# Patient Record
Sex: Female | Born: 1935 | Race: White | Hispanic: No | Smoking: Never smoker
Health system: Southern US, Community
[De-identification: ages and names within clinical notes are randomized; demographics above are authoritative.]

## PROBLEM LIST (undated history)

## (undated) DIAGNOSIS — S32000A Wedge compression fracture of unspecified lumbar vertebra, initial encounter for closed fracture: Secondary | ICD-10-CM

## (undated) DIAGNOSIS — I10 Essential (primary) hypertension: Secondary | ICD-10-CM

## (undated) DIAGNOSIS — Z889 Allergy status to unspecified drugs, medicaments and biological substances status: Secondary | ICD-10-CM

## (undated) DIAGNOSIS — M8580 Other specified disorders of bone density and structure, unspecified site: Secondary | ICD-10-CM

## (undated) DIAGNOSIS — J449 Chronic obstructive pulmonary disease, unspecified: Secondary | ICD-10-CM

## (undated) DIAGNOSIS — N189 Chronic kidney disease, unspecified: Secondary | ICD-10-CM

## (undated) DIAGNOSIS — J310 Chronic rhinitis: Secondary | ICD-10-CM

## (undated) DIAGNOSIS — F0390 Unspecified dementia without behavioral disturbance: Secondary | ICD-10-CM

## (undated) DIAGNOSIS — M1990 Unspecified osteoarthritis, unspecified site: Secondary | ICD-10-CM

## (undated) DIAGNOSIS — J42 Unspecified chronic bronchitis: Secondary | ICD-10-CM

## (undated) DIAGNOSIS — J189 Pneumonia, unspecified organism: Secondary | ICD-10-CM

## (undated) DIAGNOSIS — E785 Hyperlipidemia, unspecified: Secondary | ICD-10-CM

## (undated) DIAGNOSIS — I509 Heart failure, unspecified: Secondary | ICD-10-CM

## (undated) HISTORY — DX: Other specified disorders of bone density and structure, unspecified site: M85.80

## (undated) HISTORY — DX: Chronic obstructive pulmonary disease, unspecified: J44.9

## (undated) HISTORY — PX: CHOLECYSTECTOMY: SHX55

## (undated) HISTORY — DX: Chronic kidney disease, unspecified: N18.9

## (undated) HISTORY — DX: Allergy status to unspecified drugs, medicaments and biological substances: Z88.9

## (undated) HISTORY — DX: Chronic rhinitis: J31.0

## (undated) HISTORY — DX: Hyperlipidemia, unspecified: E78.5

## (undated) HISTORY — PX: FRACTURE SURGERY: SHX138

## (undated) HISTORY — DX: Essential (primary) hypertension: I10

## (undated) HISTORY — PX: CATARACT EXTRACTION W/ INTRAOCULAR LENS  IMPLANT, BILATERAL: SHX1307

---

## 1997-10-16 ENCOUNTER — Other Ambulatory Visit: Admission: RE | Admit: 1997-10-16 | Discharge: 1997-10-16 | Payer: Self-pay | Admitting: Family Medicine

## 1999-12-31 ENCOUNTER — Encounter: Admission: RE | Admit: 1999-12-31 | Discharge: 1999-12-31 | Payer: Self-pay | Admitting: Family Medicine

## 1999-12-31 ENCOUNTER — Encounter: Payer: Self-pay | Admitting: Family Medicine

## 2000-02-04 ENCOUNTER — Ambulatory Visit (HOSPITAL_COMMUNITY): Admission: RE | Admit: 2000-02-04 | Discharge: 2000-02-04 | Payer: Self-pay | Admitting: Family Medicine

## 2000-02-04 ENCOUNTER — Encounter: Payer: Self-pay | Admitting: Family Medicine

## 2000-03-16 ENCOUNTER — Encounter: Admission: RE | Admit: 2000-03-16 | Discharge: 2000-03-25 | Payer: Self-pay | Admitting: Neurosurgery

## 2000-03-25 ENCOUNTER — Other Ambulatory Visit: Admission: RE | Admit: 2000-03-25 | Discharge: 2000-03-25 | Payer: Self-pay | Admitting: Family Medicine

## 2000-10-20 ENCOUNTER — Encounter: Payer: Self-pay | Admitting: Family Medicine

## 2000-10-20 ENCOUNTER — Ambulatory Visit (HOSPITAL_COMMUNITY): Admission: RE | Admit: 2000-10-20 | Discharge: 2000-10-20 | Payer: Self-pay | Admitting: Family Medicine

## 2000-10-22 ENCOUNTER — Ambulatory Visit (HOSPITAL_COMMUNITY): Admission: RE | Admit: 2000-10-22 | Discharge: 2000-10-22 | Payer: Self-pay | Admitting: Family Medicine

## 2000-10-22 ENCOUNTER — Encounter: Payer: Self-pay | Admitting: Family Medicine

## 2000-11-12 ENCOUNTER — Encounter (INDEPENDENT_AMBULATORY_CARE_PROVIDER_SITE_OTHER): Payer: Self-pay | Admitting: Specialist

## 2000-11-12 ENCOUNTER — Inpatient Hospital Stay (HOSPITAL_COMMUNITY): Admission: RE | Admit: 2000-11-12 | Discharge: 2000-11-13 | Payer: Self-pay | Admitting: General Surgery

## 2001-02-15 ENCOUNTER — Encounter: Payer: Self-pay | Admitting: Family Medicine

## 2001-02-15 ENCOUNTER — Encounter: Admission: RE | Admit: 2001-02-15 | Discharge: 2001-02-15 | Payer: Self-pay | Admitting: Family Medicine

## 2001-06-01 ENCOUNTER — Other Ambulatory Visit: Admission: RE | Admit: 2001-06-01 | Discharge: 2001-06-01 | Payer: Self-pay | Admitting: Family Medicine

## 2002-02-21 ENCOUNTER — Ambulatory Visit (HOSPITAL_COMMUNITY): Admission: RE | Admit: 2002-02-21 | Discharge: 2002-02-21 | Payer: Self-pay | Admitting: Otolaryngology

## 2002-02-21 ENCOUNTER — Encounter: Payer: Self-pay | Admitting: Otolaryngology

## 2002-03-09 ENCOUNTER — Ambulatory Visit (HOSPITAL_COMMUNITY): Admission: RE | Admit: 2002-03-09 | Discharge: 2002-03-09 | Payer: Self-pay | Admitting: Otolaryngology

## 2002-03-09 ENCOUNTER — Encounter: Payer: Self-pay | Admitting: Otolaryngology

## 2002-05-16 ENCOUNTER — Encounter: Admission: RE | Admit: 2002-05-16 | Discharge: 2002-05-16 | Payer: Self-pay | Admitting: Family Medicine

## 2002-05-16 ENCOUNTER — Encounter: Payer: Self-pay | Admitting: Family Medicine

## 2002-06-14 ENCOUNTER — Encounter: Admission: RE | Admit: 2002-06-14 | Discharge: 2002-07-04 | Payer: Self-pay | Admitting: Family Medicine

## 2002-07-13 HISTORY — PX: THYROIDECTOMY, PARTIAL: SHX18

## 2002-07-25 ENCOUNTER — Other Ambulatory Visit: Admission: RE | Admit: 2002-07-25 | Discharge: 2002-07-25 | Payer: Self-pay | Admitting: Family Medicine

## 2003-03-16 ENCOUNTER — Encounter: Payer: Self-pay | Admitting: Otolaryngology

## 2003-03-16 ENCOUNTER — Ambulatory Visit (HOSPITAL_COMMUNITY): Admission: RE | Admit: 2003-03-16 | Discharge: 2003-03-16 | Payer: Self-pay | Admitting: Otolaryngology

## 2004-02-27 ENCOUNTER — Encounter: Admission: RE | Admit: 2004-02-27 | Discharge: 2004-02-27 | Payer: Self-pay | Admitting: Family Medicine

## 2004-03-12 ENCOUNTER — Ambulatory Visit (HOSPITAL_COMMUNITY): Admission: RE | Admit: 2004-03-12 | Discharge: 2004-03-12 | Payer: Self-pay | Admitting: Otolaryngology

## 2005-03-26 ENCOUNTER — Ambulatory Visit (HOSPITAL_COMMUNITY): Admission: RE | Admit: 2005-03-26 | Discharge: 2005-03-26 | Payer: Self-pay | Admitting: Otolaryngology

## 2005-04-16 ENCOUNTER — Encounter: Admission: RE | Admit: 2005-04-16 | Discharge: 2005-04-16 | Payer: Self-pay | Admitting: Family Medicine

## 2005-06-16 ENCOUNTER — Other Ambulatory Visit: Admission: RE | Admit: 2005-06-16 | Discharge: 2005-06-16 | Payer: Self-pay | Admitting: Family Medicine

## 2005-09-14 ENCOUNTER — Ambulatory Visit (HOSPITAL_COMMUNITY): Admission: RE | Admit: 2005-09-14 | Discharge: 2005-09-14 | Payer: Self-pay | Admitting: Family Medicine

## 2006-02-03 ENCOUNTER — Ambulatory Visit (HOSPITAL_COMMUNITY): Admission: RE | Admit: 2006-02-03 | Discharge: 2006-02-03 | Payer: Self-pay | Admitting: Otolaryngology

## 2006-02-03 ENCOUNTER — Encounter (INDEPENDENT_AMBULATORY_CARE_PROVIDER_SITE_OTHER): Payer: Self-pay | Admitting: *Deleted

## 2006-02-10 ENCOUNTER — Ambulatory Visit (HOSPITAL_COMMUNITY): Admission: RE | Admit: 2006-02-10 | Discharge: 2006-02-10 | Payer: Self-pay | Admitting: Family Medicine

## 2006-04-21 ENCOUNTER — Encounter: Admission: RE | Admit: 2006-04-21 | Discharge: 2006-04-21 | Payer: Self-pay | Admitting: Family Medicine

## 2006-08-19 ENCOUNTER — Ambulatory Visit (HOSPITAL_COMMUNITY): Admission: RE | Admit: 2006-08-19 | Discharge: 2006-08-19 | Payer: Self-pay | Admitting: Family Medicine

## 2007-04-25 ENCOUNTER — Ambulatory Visit (HOSPITAL_COMMUNITY): Admission: RE | Admit: 2007-04-25 | Discharge: 2007-04-25 | Payer: Self-pay | Admitting: Otolaryngology

## 2007-04-26 ENCOUNTER — Encounter: Admission: RE | Admit: 2007-04-26 | Discharge: 2007-04-26 | Payer: Self-pay | Admitting: Family Medicine

## 2007-06-03 ENCOUNTER — Ambulatory Visit (HOSPITAL_COMMUNITY): Admission: RE | Admit: 2007-06-03 | Discharge: 2007-06-04 | Payer: Self-pay | Admitting: Otolaryngology

## 2007-06-03 ENCOUNTER — Encounter (INDEPENDENT_AMBULATORY_CARE_PROVIDER_SITE_OTHER): Payer: Self-pay | Admitting: Otolaryngology

## 2008-05-15 ENCOUNTER — Encounter: Admission: RE | Admit: 2008-05-15 | Discharge: 2008-05-15 | Payer: Self-pay | Admitting: Family Medicine

## 2009-06-11 ENCOUNTER — Encounter: Admission: RE | Admit: 2009-06-11 | Discharge: 2009-06-11 | Payer: Self-pay | Admitting: Family Medicine

## 2010-08-03 ENCOUNTER — Encounter: Payer: Self-pay | Admitting: Family Medicine

## 2010-11-25 NOTE — Op Note (Signed)
[name], [name] NO.:  [id number]   MEDICAL RECORD NO.:  [id number]          PATIENT TYPE:  OIB   LOCATION:  5729                         FACILITY:  [facility]   PHYSICIAN:  Suzanna Obey, M.D.       DATE OF BIRTH:  12/30/1925   DATE OF PROCEDURE:  06/03/2007  DATE OF DISCHARGE:                               OPERATIVE REPORT   PREOPERATIVE DIAGNOSIS:  Left thyroid mass.   POSTOPERATIVE DIAGNOSIS:  Left thyroid mass.   SURGICAL PROCEDURE:  Left thyroid lobectomy.   ANESTHESIA:  General.   ESTIMATED BLOOD LOSS:  Approximately 20 mL.   INDICATIONS:  This is a 77 year old who had an enlarging mass on the  left side and actually has 2 masses in the left thyroid lobe.  She was  informed of the options of this situation.  She wants to proceed with  removal, especially since it is enlarging.  Risks and benefits were  discussed.  All of her questions were answered and consent was obtained.   OPERATION:  The patient was taken to the operating room and placed  supine position.  After adequate general endotracheal tube anesthesia,  was prepped and draped in the usual sterile manner.   An incision was made at the base of the neck and in a skin crease and  dissected down to the platysma.  Subplatysmal flap was elevated  superiorly and inferior and retractor was positioned.  The midline of  the strap muscle was divided and the left side was dissected,  identifying the thyroid gland.  Careful dissection along the capsule was  performed, removing the thyroid lobe and the recurrent nerve was  identified, which was fairly well exposed even prior to the dissection.  A dissection was performed with a hemostat into the entrance into the  larynx.  The nerve was preserved.  The thyroid lobe was removed, along  its capsule as described.  The isthmus was divided and the lobe was sent  for frozen section.  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  The  wound was irrigated and #10  drain was placed and closed with interrupted  4-0 chromic and a running 5-0 nylon.  The patient was awakened, brought  to recovery in stable condition.  Counts correct.           ______________________________  Suzanna Obey, M.D.     JB/MEDQ  D:  06/03/2007  T:  06/04/2007  Job:  [id number]

## 2010-11-28 NOTE — Op Note (Signed)
[facility]  Patient:    Carrie Crawford, Carrie Crawford                    MRN: [id number] Proc. Date: 11/12/00 Adm. Date:  [id number] Attending:  Henrene Dodge CC:         Vernon Prey, M.D., [place], [place].   Operative Report  PREOPERATIVE DIAGNOSIS:  Chronic cholecystitis.  POSTOPERATIVE DIAGNOSIS:  Chronic cholecystitis.  OPERATION PERFORMED:  Laparoscopic cholecystectomy.  ANESTHESIA:  General.  HISTORY OF PRESENT ILLNESS:  Carrie Crawford is a 77 year old Caucasian family who was referred to [place] by Dr. Charise Killian associate from [place]. She had presented with episodes of epigastric pain. She had been seen by Dr. Buel Ream associate and had an ultrasound that showed gallstones and was referred to [place] approximately 10 days ago. At that time, she was not acutely ill. Her pain was consistent with biliary colic and I recommended we proceed with a laparoscopic cholecystectomy and cholangiogram. She had had shingles in the right upper quadrant of her abdomen approximately five years ago and has had episodes of pain that has been kind of associated with the shingles and whether this is related to shingles or she has been having problems with the gallbladder I am not sure. Preoperatively her liver function studies were normal as was her white blood count.  DESCRIPTION OF PROCEDURE:  An IV started, given 3 g of Unasyn. She has PAS stockings and was taken to the operative suite. The abdomen was prepped with Betadine surgical solution and draped in a sterile manner. A small incision was made below the umbilicus with sharp dissection identifying the fascia. This was picked up between hemostats and a small opening made. She has a thin abdominal wall. A traction suture was placed and then the Hasson cannula introduced as well as carbon dioxide. The camera was inserted and she has a kind of a chronically thickened gallbladder with a lot of adhesions around it. The upper  10 mm trocar was placed after anesthestizing the fascia, and then the two lateral 5 mm trocars were placed after anesthestizing the fascia with Dr. ______. The gallbladder was retracted upward and outward and then we kind of stripped the adhesions away full range of motion the gallbladder. There were several little areas that were actually clipped. ______ adhesions plus there was a chronic problem.  The proximal portion of the gallbladder was identified as was the cystic artery. The cystic artery was doubly clipped proximally, singly, distally and divided and then the cystic duct was identified. There was a stone within this. There was a very small cystic duct proximal. I passed a clip, sort of trapping this little stone within the cystic duct and then the more proximal portion was very small and the cystic duct catheter would barely fit in it. We elected to clip the cystic duct more proximally and divide it. The gallbladder was then freed from its bed using the hook electrocautery and good hemostasis obtained and then the gallbladder brought out through the umbilical defect with the camera and the upper 10 mm trocar. Good hemostasis. We then irrigated and the fluid that had been used was aspirated. I then tied the pursestring suture in place and then placed an additional figure-of-eight in the umbilicus.  I then removed the 5 mm trocar under direct vision without bleeding, releasing carbon dioxide, and then withdrew the camera and trocar. The subcutaneous wounds were closed with 4-0 Vicryl and Benzoin and Steri-Strips placed on  the skin. The patient tolerated the procedure nicely and was sent to the recovery room extubated in satisfactory postop condition. I think the patient will be ready for release in the a.m. DD:  11/12/00 TD:  11/12/00 Job: [id number]

## 2010-11-28 NOTE — Discharge Summary (Signed)
[facility]  Patient:    Carrie Crawford, Carrie Crawford                    MRN: [id number] Adm. Date:  [id number] Disc. Date: [id number] Attending:  Henrene Dodge CC:         Monica Becton, M.D.   Discharge Summary  DISCHARGE DIAGNOSIS:  Chronic cholecystitis.  OPERATIONS:  Laparoscopic cholecystectomy.  HISTORY OF PRESENT ILLNESS:  Carrie Crawford is a 77 year old Caucasian female who has been followed by Monica Becton, M.D., and associates in [place], [place], for various medical problems.  She had a history of shingles approximately five years ago.  She has had intermittent episodes of pain in the right upper quadrant, thought to be secondary to her previous shingles. Recently she had a more severe attack and was seen by the physicians assistant and a CT of the abdomen was obtained.  This showed a large gallstone within the neck of the gallbladder.  Her acute symptoms subsided, but she was referred to [place] for management of this problem.  The patient states that over the last several days she has not had any episodes of right upper quadrant pain.  She does take an aspirin a day and vitamins.  We asked her to discontinue the aspirin a few days prior to surgery.  HOSPITAL COURSE:  The patient was a 24-hour evaluation for laboratory studies prior to this admission.  This showed a hematocrit of 40.7, a white blood cell count of 3.9, and normal CMET. An EKG was obtained and preoperatively this showed a normal sinus rhythm with normal EKG.  The patient was taken to surgery and Zigmund Daniel, M.D., assisted.  A laparoscopic cholecystectomy was performed.  She had a stone impacted in the neck of the gallbladder with a moderate amount of adhesions around the gallbladder.  I am sure that a lot of these episodes of pain that she had previously thought was related to her shingles was really related to the gallbladder.  She did  nicely postoperatively and was ready for discharge the morning after surgery.  FOLLOW-UP:  She will be seen back in the office for a follow-up appointment in approximately a week.  DISCHARGE MEDICATIONS:  She has Vicodin if needed for pain.  ACTIVITIES:  She can resume her normal activities in approximately two weeks. DD:  11/30/00 TD:  11/30/00 Job: [id number]

## 2011-01-16 ENCOUNTER — Ambulatory Visit (INDEPENDENT_AMBULATORY_CARE_PROVIDER_SITE_OTHER): Payer: Medicare Other | Admitting: Internal Medicine

## 2011-01-16 ENCOUNTER — Encounter: Payer: Self-pay | Admitting: Internal Medicine

## 2011-01-16 ENCOUNTER — Other Ambulatory Visit: Payer: Medicare Other

## 2011-01-16 VITALS — BP 118/68 | HR 69 | Ht 63.0 in | Wt 164.0 lb

## 2011-01-16 DIAGNOSIS — J310 Chronic rhinitis: Secondary | ICD-10-CM | POA: Insufficient documentation

## 2011-01-16 DIAGNOSIS — J449 Chronic obstructive pulmonary disease, unspecified: Secondary | ICD-10-CM | POA: Insufficient documentation

## 2011-01-16 DIAGNOSIS — J42 Unspecified chronic bronchitis: Secondary | ICD-10-CM

## 2011-01-16 MED ORDER — MOMETASONE FUROATE 50 MCG/ACT NA SUSP: 2.0000 | Freq: Every day | NASAL | Status: DC

## 2011-01-16 MED ORDER — MOMETASONE FURO-FORMOTEROL FUM 100-5 MCG/ACT IN AERO: 2.0000 | INHALATION_SPRAY | Freq: Two times a day (BID) | RESPIRATORY_TRACT | Status: DC

## 2011-01-16 NOTE — Progress Notes (Signed)
  Subjective:    Patient ID: Carrie Crawford, female    DOB: 1926/05/07, 77 y.o.   MRN: [id number]  HPI 01/16/11- 84yoFnever smoker, previously followed at [facility] , followed for PCP by Paulene Floor, FNP in [place]. Now self referred for concern with cough and nasal congestion. These are the same problems she had years ago. Describes cough with weather changes. Had RUL pneumonia in Jan, 2012 and brings CXR films which I reviewed. F/U CXR 08/11/10 showed resolution of the infiltrate. Cough otherwise worse in past 4-5 years. Cough easily worsens with virus infections or irritant exposures. Occasional white phlegm. Rescue inhaler helps some. Rubs chest with BenGay. Feels postnasal drip. Short of breath only with steady exertion. Has not changed much in the past year. Breathing does not wake her. Inhalers for chest also seem to help nasal congestion. Arthritis limits walking more than her breathing does.   Review of Systems Constitutional:   No weight loss, night sweats,  Fevers, chills, fatigue, lassitude. HEENT:   No headaches,  Difficulty swallowing,  Tooth/dental problems,  Sore throat,                No sneezing, itching, ear ache,   CV:  No chest pain,  Orthopnea, PND, swelling in lower extremities, anasarca, dizziness, palpitations  GI  No heartburn, indigestion, abdominal pain, nausea, vomiting, diarrhea, change in bowel habits, loss of appetite  Resp: Little shortness of breath with exertion or at rest.  No excess mucus,  No coughing up of blood.  No change in color of mucus.  No wheezing.    Skin: no rash or lesions.  GU: no dysuria, change in color of urine, no urgency or frequency.  No flank pain.  MS:  No joint pain or swelling.  No decreased range of motion.  No back pain.  Psych:  No change in mood or affect. No depression or anxiety.  No memory loss.      Objective:   Physical Exam General- Alert, Oriented, Affect-appropriate, Distress- none acute   talkative  Skin- rash-none,  lesions- none, excoriation- none  Lymphadenopathy- none  Head- atraumatic  Eyes- Gross vision intact, PERRLA, conjunctivae clear secretions  Ears- Hearing, canals, Tm- normal  Nose- Clear, No-Septal dev, mucus, polyps, erosion, perforation   Throat- Mallampati II , mucosa clear , drainage- none, tonsils- atrophic  Neck- flexible , trachea midline, no stridor , thyroid nl, carotid no bruit  Chest - symmetrical excursion , unlabored     Heart/CV- RRR , no murmur , no gallop  , no rub, nl s1 s2                     - JVD- none , edema- none, stasis changes- none, varices- none     Lung-faint scattered rattle, wheeze- none, cough- none , dullness-none, rub- none     Chest wall-  Abd- tender-no, distended-no, bowel sounds-present, HSM- no  Br/ Gen/ Rectal- Not done, not indicated  Extrem- cyanosis- none, clubbing, none, atrophy- none, strength- nl  Neuro- grossly intact to observation         Assessment & Plan:

## 2011-01-16 NOTE — Patient Instructions (Signed)
Sample/ script Nasonex nasal spray   1-2 sprays each nostril once daily at bedtime  Sample/ script Dulera 100-5 inhaler   2 puffs, then rinse mouth, twice every day  Order- lab- Allergy profile  - chronic rhinitis

## 2011-01-16 NOTE — Assessment & Plan Note (Addendum)
We will see what effect she gets from [place]. Her last CXR doesn't show obvious M.avium, although that would be common in her demographic group.

## 2011-01-16 NOTE — Assessment & Plan Note (Signed)
She asks if this is allergy- more likely nonspecific, but we can send allergy profile and try nasonex

## 2011-01-18 ENCOUNTER — Encounter: Payer: Self-pay | Admitting: Internal Medicine

## 2011-01-19 LAB — ALLERGY FULL PROFILE
Alternaria Alternata: <0.1 kU/L (ref <0.35)
Aspergillus fumigatus, IgG: 0.11 kU/L (ref <0.35)
Bahia Grass: <0.1 kU/L (ref <0.35)
Cat Dander: <0.1 kU/L (ref <0.35)
Curvularia lunata: <0.1 kU/L (ref <0.35)
D. farinae: <0.1 kU/L (ref <0.35)
Elm IgE: <0.1 kU/L (ref <0.35)
G009 Red Top: <0.1 kU/L (ref <0.35)
Lamb's Quarters: <0.1 kU/L (ref <0.35)
Oak: <0.1 kU/L (ref <0.35)
Plantain: <0.1 kU/L (ref <0.35)
Sycamore Tree: <0.1 kU/L (ref <0.35)

## 2011-01-22 NOTE — Progress Notes (Signed)
Quick Note:  Pt aware of results. ______ 

## 2011-03-19 ENCOUNTER — Ambulatory Visit: Payer: Medicare Other | Admitting: Internal Medicine

## 2011-04-21 LAB — CBC
HCT: 41.3
Hemoglobin: 13.8
MCHC: 33.5
MCV: 92.2
RDW: 13.2

## 2011-04-21 LAB — BASIC METABOLIC PANEL
CO2: 30
Calcium: 9.1
Chloride: 94 — ABNORMAL LOW
Glucose, Bld: 97
Sodium: 131 — ABNORMAL LOW

## 2012-09-01 LAB — BASIC METABOLIC PANEL
Creatinine: 0.8 mg/dL (ref 0.5–1.1)
GLUCOSE: 103 mg/dL

## 2012-09-01 LAB — HEPATIC FUNCTION PANEL
ALT: 11 U/L (ref 7–35)
AST: 17 U/L (ref 13–35)
Alkaline Phosphatase: 58 U/L (ref 25–125)
BILIRUBIN DIRECT: 0.1 mg/dL (ref 0.01–0.4)
Bilirubin, Total: 0.6 mg/dL

## 2012-09-01 LAB — LIPID PANEL
Cholesterol: 193 mg/dL (ref 0–200)
HDL: 55 mg/dL (ref 35–70)
LDL CALC: 114 mg/dL
TRIGLYCERIDES: 119 mg/dL (ref 40–160)

## 2012-09-01 LAB — TSH: TSH: 2.04 u[IU]/mL (ref 0.41–5.90)

## 2012-09-01 LAB — CBC AND DIFFERENTIAL
HEMATOCRIT: 39 % (ref 36–46)
Hemoglobin: 13.1 g/dL (ref 12.0–16.0)
PLATELETS: 230 10*3/uL (ref 150–399)
WBC: 3.9 10^3/mL

## 2012-11-17 ENCOUNTER — Telehealth: Payer: Self-pay | Admitting: Nurse Practitioner

## 2012-11-17 NOTE — Telephone Encounter (Signed)
error 

## 2012-11-29 ENCOUNTER — Ambulatory Visit: Payer: Self-pay | Admitting: Physician Assistant

## 2012-12-06 ENCOUNTER — Other Ambulatory Visit: Payer: Self-pay | Admitting: Dermatology

## 2012-12-14 ENCOUNTER — Telehealth: Payer: Self-pay | Admitting: Nurse Practitioner

## 2012-12-14 NOTE — Telephone Encounter (Signed)
Samples up front 

## 2012-12-14 NOTE — Telephone Encounter (Signed)
Ok for samples?

## 2012-12-16 ENCOUNTER — Telehealth: Payer: Self-pay | Admitting: Family Medicine

## 2012-12-16 NOTE — Telephone Encounter (Signed)
Ok to give patient samples?

## 2012-12-19 ENCOUNTER — Other Ambulatory Visit: Payer: Self-pay | Admitting: *Deleted

## 2012-12-19 MED ORDER — OLMESARTAN MEDOXOMIL-HCTZ 40-12.5 MG PO TABS: 1.0000 | ORAL_TABLET | Freq: Every day | ORAL | Status: DC

## 2012-12-19 NOTE — Telephone Encounter (Signed)
RECEIVED FAX FROM [facility] FOR BENICAR/HCT 40-12.5MG . VERIFIED MEDICINE WITH PATIENT AND FILLED RX TO [facility].

## 2013-01-18 ENCOUNTER — Encounter: Payer: Self-pay | Admitting: Nurse Practitioner

## 2013-01-18 ENCOUNTER — Ambulatory Visit (INDEPENDENT_AMBULATORY_CARE_PROVIDER_SITE_OTHER): Payer: Medicare Other | Admitting: Nurse Practitioner

## 2013-01-18 VITALS — BP 142/72 | HR 75 | Temp 97.5°F | Ht 63.0 in | Wt 189.0 lb

## 2013-01-18 DIAGNOSIS — S90569A Insect bite (nonvenomous), unspecified ankle, initial encounter: Secondary | ICD-10-CM

## 2013-01-18 DIAGNOSIS — S80862A Insect bite (nonvenomous), left lower leg, initial encounter: Secondary | ICD-10-CM

## 2013-01-18 DIAGNOSIS — W57XXXA Bitten or stung by nonvenomous insect and other nonvenomous arthropods, initial encounter: Secondary | ICD-10-CM

## 2013-01-18 MED ORDER — DOXYCYCLINE HYCLATE 100 MG PO TABS: 100.0000 mg | ORAL_TABLET | Freq: Two times a day (BID) | ORAL | Status: DC

## 2013-01-18 NOTE — Patient Instructions (Signed)
Deer Tick Bite Deer ticks are brown arachnids (spider family) that vary in size from as small as the head of a pin to 1/4 inch (1/2 cm) diameter. They thrive in wooded areas. Deer are the preferred host of adult deer ticks. Small rodents are the host of young ticks (nymphs). When a person walks in a field or wooded area, young and adult ticks in the surrounding grass and vegetation can attach themselves to the skin. They can suck blood for hours to days if unnoticed. Ticks are found all over the [place] Some ticks carry a specific bacteria (Borrelia burgdorferi) that causes an infection called Lyme disease. The bacteria is typically passed into a person during the blood sucking process. This happens after the tick has been attached for at least a number of hours. While ticks can be found all over the [place] those carrying the bacteria that causes Lyme disease are most common in [place] and the [place]. Only a small proportion of ticks in these areas carry the Lyme disease bacteria and cause human infections. Ticks usually attach to warm spots on the body, such as the:  Head.  Back.  Neck.  Armpits.  Groin. SYMPTOMS  Most of the time, a deer tick bite will not be felt. You may or may not see the attached tick. You may notice mild irritation or redness around the bite site. If the deer tick passes the Lyme disease bacteria to a person, a round, red rash may be noticed 2 to 3 days after the bite. The rash may be clear in the middle, like a bull's-eye or target. If not treated, other symptoms may develop several days to weeks after the onset of the rash. These symptoms may include:  New rash lesions.  Fatigue and weakness.  General ill feeling and achiness.  Chills.  Headache and neck pain.  Swollen lymph glands.  Sore muscles and joints. 5 to 15% of untreated people with Lyme disease may develop more severe illnesses after several weeks to months. This may include inflammation of the  brain lining (meningitis), nerve palsies, an abnormal heartbeat, or severe muscle and joint pain and inflammation (myositis or arthritis). DIAGNOSIS   Physical exam and medical history.  Viewing the tick if it was saved for confirmation.  Blood tests (to check or confirm the presence of Lyme disease). TREATMENT  Most ticks do not carry disease. If found, an attached tick should be removed using tweezers. Tweezers should be placed under the body of the tick so it is removed by its attachment parts (pincers). If there are signs or symptoms of being sick, or Lyme disease is confirmed, medicines (antibiotics) that kill germs are usually prescribed. In more severe cases, antibiotics may be given through an intravenous (IV) access. HOME CARE INSTRUCTIONS   Always remove ticks with tweezers. Do not use petroleum jelly or other methods to kill or remove the tick. Slide the tweezers under the body and pull out as much as you can. If you are not sure what it is, save it in a jar and show your caregiver.  Once you remove the tick, the skin will heal on its own. Wash your hands and the affected area with water and soap. You may place a bandage on the affected area.  Take medicine as directed. You may be advised to take a full course of antibiotics.  Follow up with your caregiver as recommended. FINDING OUT THE RESULTS OF YOUR TEST Not all test results are available   during your visit. If your test results are not back during the visit, make an appointment with your caregiver to find out the results. Do not assume everything is normal if you have not heard from your caregiver or the medical facility. It is important for you to follow up on all of your test results. PROGNOSIS  If Lyme disease is confirmed, early treatment with antibiotics is very effective. Following preventive guidelines is important since it is possible to get the disease more than once. PREVENTION   Wear long sleeves and long pants in  wooded or grassy areas. Tuck your pants into your socks.  Use an insect repellent while hiking.  Check yourself, your children, and your pets regularly for ticks after playing outside.  Clear piles of leaves or brush from your yard. Ticks might live there. SEEK MEDICAL CARE IF:   You or your child has an oral temperature above 102 F (38.9 C).  You develop a severe headache following the bite.  You feel generally ill.  You notice a rash.  You are having trouble removing the tick.  The bite area has red skin or yellow drainage. SEEK IMMEDIATE MEDICAL CARE IF:   Your face is weak and droopy or you have other neurological symptoms.  You have severe joint pain or weakness. MAKE SURE YOU:   Understand these instructions.  Will watch your condition.  Will get help right away if you are not doing well or get worse. FOR MORE INFORMATION Centers for Disease Control and Prevention: www.cdc.gov American Academy of Family Physicians: www.aafp.org Document Released: 09/23/2009 Document Revised: 09/21/2011 Document Reviewed: 09/23/2009 [employer] Patient Information 2014 [employer].  

## 2013-01-18 NOTE — Progress Notes (Signed)
  Subjective:    Patient ID: Carrie Crawford, female    DOB: 1926-02-03, 79 y.o.   MRN: [id number]  HPI Patient in stating she got bit by a tick over a week ago and area is getting red. Nort sure how lng tick was attached.    Review of Systems  Constitutional: Negative for fever, activity change, appetite change and fatigue.  HENT: Negative.   Eyes: Negative.   Respiratory: Negative.   Cardiovascular: Negative.   Gastrointestinal: Negative.   Skin: Positive for color change (red posterior left leg).       Objective:   Physical Exam  Constitutional: She appears well-developed and well-nourished.  Cardiovascular: Normal rate, normal heart sounds and intact distal pulses.   Pulmonary/Chest: Effort normal and breath sounds normal.  Skin:  10 com annular macular area- warm to touch-left posterior knee.     BP 142/72  Pulse 75  Temp(Src) 97.5 F (36.4 C) (Oral)  Ht 5\' 3"  (1.6 m)  Wt 189 lb (85.73 kg)  BMI 33.49 kg/m2      Assessment & Plan:  1. Tick bite of lower leg, left, initial encounter Cool compresses Watch area RTO if not improving - doxycycline (VIBRA-TABS) 100 MG tablet; Take 1 tablet (100 mg total) by mouth 2 (two) times daily.  Dispense: 20 tablet; Refill: 0  Mary-Margaret Daphine Deutscher, FNP

## 2013-04-26 ENCOUNTER — Encounter: Payer: Self-pay | Admitting: Nurse Practitioner

## 2013-04-26 ENCOUNTER — Ambulatory Visit (INDEPENDENT_AMBULATORY_CARE_PROVIDER_SITE_OTHER): Payer: Medicare Other | Admitting: Nurse Practitioner

## 2013-04-26 VITALS — BP 151/85 | HR 71 | Temp 97.7°F | Ht 63.0 in | Wt 190.0 lb

## 2013-04-26 DIAGNOSIS — I10 Essential (primary) hypertension: Secondary | ICD-10-CM | POA: Insufficient documentation

## 2013-04-26 DIAGNOSIS — I509 Heart failure, unspecified: Secondary | ICD-10-CM

## 2013-04-26 DIAGNOSIS — Z23 Encounter for immunization: Secondary | ICD-10-CM

## 2013-04-26 MED ORDER — OLMESARTAN MEDOXOMIL 40 MG PO TABS: 40.0000 mg | ORAL_TABLET | Freq: Every day | ORAL | Status: DC

## 2013-04-26 MED ORDER — FUROSEMIDE 20 MG PO TABS: ORAL_TABLET | ORAL | Status: DC

## 2013-04-26 NOTE — Progress Notes (Signed)
  Subjective:    Patient ID: Algernon Huxley, female    DOB: 01-Jun-1926, 79 y.o.   MRN: [id number]  Hypertension This is a chronic problem. The current episode started more than 1 year ago. The problem is unchanged. The problem is uncontrolled. Pertinent negatives include no blurred vision, chest pain, headaches, malaise/fatigue, palpitations, peripheral edema or shortness of breath. There are no associated agents to hypertension. Risk factors for coronary artery disease include family history, obesity, post-menopausal state and sedentary lifestyle. Past treatments include angiotensin blockers. The current treatment provides moderate improvement. Compliance problems include diet and exercise.   COPD-  Dulera dialy- keeps her under control- She does have a cough but has had it her entire life.    Review of Systems  Constitutional: Negative for malaise/fatigue.  Eyes: Negative for blurred vision.  Respiratory: Negative for shortness of breath.   Cardiovascular: Negative for chest pain and palpitations.  Neurological: Negative for headaches.  All other systems reviewed and are negative.       Objective:   Physical Exam  Constitutional: She is oriented to person, place, and time. She appears well-developed and well-nourished.  HENT:  Nose: Nose normal.  Mouth/Throat: Oropharynx is clear and moist.  Eyes: EOM are normal.  Neck: Trachea normal, normal range of motion and full passive range of motion without pain. Neck supple. No JVD present. Carotid bruit is not present. No thyromegaly present.  Cardiovascular: Normal rate, regular rhythm, normal heart sounds and intact distal pulses.  Exam reveals no gallop and no friction rub.   No murmur heard. Pulmonary/Chest: Effort normal. She has rales (bil lower bases).  Abdominal: Soft. Bowel sounds are normal. She exhibits no distension and no mass. There is no tenderness.  Musculoskeletal: Normal range of motion.  Lymphadenopathy:    She has no  cervical adenopathy.  Neurological: She is alert and oriented to person, place, and time. She has normal reflexes.  Skin: Skin is warm and dry.  Psychiatric: She has a normal mood and affect. Her behavior is normal. Judgment and thought content normal.    BP 151/85  Pulse 71  Temp(Src) 97.7 F (36.5 C) (Oral)  Ht 5\' 3"  (1.6 m)  Wt 190 lb (86.183 kg)  BMI 33.67 kg/m2       Assessment & Plan:   1. Hypertension   2. CHF (congestive heart failure)    Orders Placed This Encounter  Procedures  . CMP14+EGFR  . NMR, lipoprofile  . Brain natriuretic peptide   Meds ordered this encounter  Medications  . furosemide (LASIX) 20 MG tablet    Sig: 1/2- 1 po qd    Dispense:  30 tablet    Refill:  3    Order Specific Question:  Supervising Provider    Answer:  Ernestina Penna [[id number]]  . olmesartan (BENICAR) 40 MG tablet    Sig: Take 1 tablet (40 mg total) by mouth daily.    Dispense:  30 tablet    Refill:  1    Order Specific Question:  Supervising Provider    Answer:  Deborra Medina    Continue all meds Labs pending Diet and exercise encouraged Health maintenance reviewed Follow up in 3 months  Mary-Margaret Daphine Deutscher, FNP

## 2013-04-26 NOTE — Patient Instructions (Signed)

## 2013-04-28 LAB — CMP14+EGFR
ALT: 12 IU/L (ref 0–32)
AST: 19 IU/L (ref 0–40)
Alkaline Phosphatase: 66 IU/L (ref 39–117)
BUN/Creatinine Ratio: 12 (ref 11–26)
BUN: 11 mg/dL (ref 8–27)
CO2: 27 mmol/L (ref 18–29)
Chloride: 94 mmol/L — ABNORMAL LOW (ref 97–108)
GFR calc Af Amer: 66 mL/min/{1.73_m2} (ref >59)
Potassium: 4.6 mmol/L (ref 3.5–5.2)
Sodium: 134 mmol/L (ref 134–144)
Total Bilirubin: 0.5 mg/dL (ref 0.0–1.2)

## 2013-04-28 LAB — BRAIN NATRIURETIC PEPTIDE: BNP: 45.3 pg/mL (ref 0.0–100.0)

## 2013-04-28 LAB — NMR, LIPOPROFILE
LDL Particle Number: 1116 nmol/L — ABNORMAL HIGH (ref <1000)
LDL Size: 21.2 nm (ref >20.5)
LDLC SERPL CALC-MCNC: 106 mg/dL — ABNORMAL HIGH (ref <100)
LP-IR Score: 35 (ref <=45)

## 2013-05-02 ENCOUNTER — Telehealth: Payer: Self-pay | Admitting: Nurse Practitioner

## 2013-05-02 NOTE — Telephone Encounter (Signed)
Pharmacy needs to send requests for these meds

## 2013-05-02 NOTE — Telephone Encounter (Signed)
None of these meds are on med list?

## 2013-05-04 MED ORDER — BUDESONIDE-FORMOTEROL FUMARATE 80-4.5 MCG/ACT IN AERO: 1.0000 | INHALATION_SPRAY | Freq: Two times a day (BID) | RESPIRATORY_TRACT | Status: DC

## 2013-05-04 MED ORDER — OLMESARTAN MEDOXOMIL 40 MG PO TABS: 40.0000 mg | ORAL_TABLET | Freq: Every day | ORAL | Status: DC

## 2013-05-04 MED ORDER — MOMETASONE FUROATE 50 MCG/ACT NA SUSP: 2.0000 | Freq: Every day | NASAL | Status: DC

## 2013-05-04 NOTE — Telephone Encounter (Signed)
Patients son says the new pharmacy want have them that they were using [facility] so the pharmacy cant send them. Just send all the meds we have on her list to [facility]. Patient son is getting very ill because this is taking to long

## 2013-05-04 NOTE — Telephone Encounter (Signed)
Spoke with patient and told can't be on dulera and symbicort- patient wanted to do symbicort Patient doesn't use neb machine so did not want solution refill Refilled benicar and nasonex and symbicort Going to hold lasix for now- ptient hasn't been taking.

## 2013-07-17 ENCOUNTER — Encounter: Payer: Self-pay | Admitting: Nurse Practitioner

## 2013-07-17 ENCOUNTER — Ambulatory Visit (INDEPENDENT_AMBULATORY_CARE_PROVIDER_SITE_OTHER): Payer: Medicare Other

## 2013-07-17 ENCOUNTER — Ambulatory Visit (INDEPENDENT_AMBULATORY_CARE_PROVIDER_SITE_OTHER): Payer: Medicare Other | Admitting: Nurse Practitioner

## 2013-07-17 VITALS — BP 162/71 | HR 86 | Temp 97.7°F | Ht 63.0 in | Wt 192.0 lb

## 2013-07-17 DIAGNOSIS — R1011 Right upper quadrant pain: Secondary | ICD-10-CM

## 2013-07-17 DIAGNOSIS — R071 Chest pain on breathing: Secondary | ICD-10-CM

## 2013-07-17 DIAGNOSIS — R0789 Other chest pain: Secondary | ICD-10-CM

## 2013-07-17 MED ORDER — OLMESARTAN MEDOXOMIL 40 MG PO TABS: 40.0000 mg | ORAL_TABLET | Freq: Every day | ORAL | Status: DC

## 2013-07-17 MED ORDER — HYDROCHLOROTHIAZIDE 25 MG PO TABS: 25.0000 mg | ORAL_TABLET | Freq: Every day | ORAL | Status: DC

## 2013-07-17 NOTE — Progress Notes (Addendum)
   Subjective:    Patient ID: Carrie Crawford, female    DOB: 27-Jan-1926, 80 y.o.   MRN: [id number]  HPI Patient has had a cough for awhile and now she is having right flank pain. Pain started 3-4 weeks ago and it comes and goes. Pain not related to food consumption- pain increasing some with coughing.    Review of Systems  Constitutional: Negative.   Respiratory: Positive for cough. Negative for shortness of breath.   Cardiovascular: Negative.   Gastrointestinal: Negative.   Genitourinary: Negative.   Musculoskeletal: Negative.   Neurological: Negative.   Hematological: Negative.   Psychiatric/Behavioral: Negative.        Objective:   Physical Exam  Constitutional: She is oriented to person, place, and time. She appears well-developed and well-nourished.  Cardiovascular: Normal rate and normal heart sounds.   Pulmonary/Chest: Effort normal and breath sounds normal.  Abdominal: Soft. Bowel sounds are normal.  Right upper quadrant and flank pain on palaption  Neurological: She is alert and oriented to person, place, and time.  Skin: Skin is warm and dry.  Psychiatric: She has a normal mood and affect. Her behavior is normal. Judgment and thought content normal.   BP 162/71  Pulse 86  Temp(Src) 97.7 F (36.5 C) (Oral)  Ht $R'5\' 3"'JC$  (1.6 m)  Wt 192 lb (87.091 kg)  BMI 34.02 kg/m2 Chest xray- no  Acute findings-Preliminary reading by Ronnald Collum, FNP  [facility]        Assessment & Plan:   1. Right-sided chest wall pain   2. RUQ pain    Orders Placed This Encounter  Procedures  . DG Chest 2 View    Standing Status: Future     Number of Occurrences: 1     Standing Expiration Date: 09/16/2014    Order Specific Question:  Reason for Exam (SYMPTOM  OR DIAGNOSIS REQUIRED)    Answer:  right side pain    Order Specific Question:  Preferred imaging location?    Answer:  Internal  . US Abdomen Limited RUQ    Standing Status: Future     Number of Occurrences:      Standing  Expiration Date: 09/15/2014    Order Specific Question:  Reason for Exam (SYMPTOM  OR DIAGNOSIS REQUIRED)    Answer:  ruq pian    Order Specific Question:  Preferred imaging location?    Answer:  [facility]  . CMP14+EGFR   Force fluids  Rest Added HCTZ to meds Follow up in 1 month  [place], [place]

## 2013-07-17 NOTE — Addendum Note (Signed)
Addended by: Chevis Pretty on: 07/17/2013 05:03 PM   Modules accepted: Orders

## 2013-07-17 NOTE — Patient Instructions (Signed)

## 2013-07-18 ENCOUNTER — Telehealth: Payer: Self-pay | Admitting: Nurse Practitioner

## 2013-07-18 LAB — CMP14+EGFR
ALBUMIN: 4 g/dL (ref 3.5–4.7)
ALK PHOS: 77 IU/L (ref 39–117)
ALT: 12 IU/L (ref 0–32)
AST: 18 IU/L (ref 0–40)
Albumin/Globulin Ratio: 1.1 (ref 1.1–2.5)
BILIRUBIN TOTAL: 0.3 mg/dL (ref 0.0–1.2)
BUN / CREAT RATIO: 18 (ref 11–26)
BUN: 15 mg/dL (ref 8–27)
CO2: 27 mmol/L (ref 18–29)
CREATININE: 0.83 mg/dL (ref 0.57–1.00)
Calcium: 8.9 mg/dL (ref 8.6–10.2)
Chloride: 94 mmol/L — ABNORMAL LOW (ref 97–108)
GFR calc non Af Amer: 65 mL/min/{1.73_m2} (ref >59)
GFR, EST AFRICAN AMERICAN: 74 mL/min/{1.73_m2} (ref >59)
GLOBULIN, TOTAL: 3.5 g/dL (ref 1.5–4.5)
Glucose: 83 mg/dL (ref 65–99)
Potassium: 4.8 mmol/L (ref 3.5–5.2)
SODIUM: 134 mmol/L (ref 134–144)
Total Protein: 7.5 g/dL (ref 6.0–8.5)

## 2013-07-18 NOTE — Telephone Encounter (Signed)
k for samples if we have

## 2013-07-18 NOTE — Telephone Encounter (Signed)
No samples,please send in rx .

## 2013-07-19 NOTE — Telephone Encounter (Signed)
Samples up front

## 2013-07-25 ENCOUNTER — Ambulatory Visit (HOSPITAL_COMMUNITY): Payer: Medicare Other

## 2013-07-31 ENCOUNTER — Ambulatory Visit (INDEPENDENT_AMBULATORY_CARE_PROVIDER_SITE_OTHER): Payer: Medicare Other | Admitting: Internal Medicine

## 2013-07-31 ENCOUNTER — Encounter: Payer: Self-pay | Admitting: Internal Medicine

## 2013-07-31 VITALS — BP 120/68 | HR 84 | Ht 63.0 in | Wt 193.8 lb

## 2013-07-31 DIAGNOSIS — J310 Chronic rhinitis: Secondary | ICD-10-CM

## 2013-07-31 DIAGNOSIS — J42 Unspecified chronic bronchitis: Secondary | ICD-10-CM

## 2013-07-31 DIAGNOSIS — J209 Acute bronchitis, unspecified: Secondary | ICD-10-CM

## 2013-07-31 MED ORDER — AZITHROMYCIN 250 MG PO TABS: ORAL_TABLET | ORAL | Status: DC

## 2013-07-31 NOTE — Patient Instructions (Signed)
Script sent for Zpak antibiotic  Sample Spiriva inhaler- 1 puff one time daily  If no better after using up the Spiriva sample, call us about trying nebulizer medicine for your machine

## 2013-07-31 NOTE — Progress Notes (Signed)
07/31/13- 72 yoF never smoker followed for chronic bronchitis, rhinitis, complicated by HBP FOLLOWS FOR: Pt last seen 2012. c/o:  sob in morning hours x1 month, also has cough with green/yellow mucus x1 week   Son here Dyspnea on exertion  but not very active. Cough with scant green sputum but no fever or sore throat. Son thinks her metered inhaler technique is ineffective. She denies wheeze or chest tightness. CXR 07/17/13 IMPRESSION:  Cardiomegaly.  Mildly tortuous calcified aorta.  No infiltrate, congestive heart failure or pneumothorax. .  Electronically Signed  By: Chauncey Cruel M.D.  On: 07/17/2013 17:25  ROS-see HPI Constitutional:   No-   weight loss, night sweats, fevers, chills, +fatigue, lassitude. HEENT:   No-  headaches, difficulty swallowing, tooth/dental problems, sore throat,       No-  sneezing, itching, ear ache, nasal congestion, post nasal drip,  CV:  No-   chest pain, orthopnea, PND, swelling in lower extremities, anasarca,                                                     dizziness, palpitations Resp: shortness of breath with exertion or at rest.              +  productive cough,  No non-productive cough,  No- coughing up of blood.              No-   change in color of mucus.  No- wheezing.   Skin: No-   rash or lesions. GI:  No-   heartburn, indigestion, abdominal pain, nausea, vomiting,  GU:  MS:  No-   joint pain or swelling.  . Neuro-     nothing unusual Psych:  No- change in mood or affect. No depression or anxiety.  No memory loss.  OBJ- Physical Exam General- Alert, Oriented, Affect-appropriate, Distress- none acute, overweight Skin- rash-none, lesions- none, excoriation- none Lymphadenopathy- none Head- atraumatic            Eyes- Gross vision intact, PERRLA, conjunctivae and secretions clear            Ears- Hearing, canals-normal            Nose- Clear, no-Septal dev, mucus, polyps, erosion, perforation             Throat- Mallampati II , mucosa clear  , drainage- none, tonsils- atrophic Neck- flexible , trachea midline, no stridor , thyroid nl, carotid no bruit Chest - symmetrical excursion , unlabored           Heart/CV- RRR , no murmur , no gallop  , no rub, nl s1 s2                           - JVD- none , edema- none, stasis changes- none, varices- none           Lung- clear to P&A, wheeze- none, cough+ Light , dullness-none, rub- none           Chest wall-  Abd- Br/ Gen/ Rectal- Not done, not indicated Extrem- cyanosis- none, clubbing, none, atrophy- none, strength- nl Neuro- grossly intact to observation

## 2013-08-09 ENCOUNTER — Telehealth: Payer: Self-pay | Admitting: Internal Medicine

## 2013-08-09 MED ORDER — TIOTROPIUM BROMIDE MONOHYDRATE 18 MCG IN CAPS: 18.0000 ug | ORAL_CAPSULE | Freq: Every day | RESPIRATORY_TRACT | Status: DC

## 2013-08-09 NOTE — Telephone Encounter (Signed)
I called and spoke with [facility]. He reports the spiriva has helped with her breathing and needs RX sent. Nothing further needed

## 2013-08-27 NOTE — Assessment & Plan Note (Signed)
Plan-Z-Pak, sample Spiriva. She may end up needing refill her nebulizer. Medications reviewed

## 2013-08-27 NOTE — Assessment & Plan Note (Signed)
controlled 

## 2013-09-11 ENCOUNTER — Encounter: Payer: Self-pay | Admitting: Internal Medicine

## 2013-09-11 ENCOUNTER — Ambulatory Visit (INDEPENDENT_AMBULATORY_CARE_PROVIDER_SITE_OTHER): Payer: Medicare Other | Admitting: Internal Medicine

## 2013-09-11 VITALS — BP 138/70 | HR 72 | Ht 64.0 in | Wt 193.0 lb

## 2013-09-11 DIAGNOSIS — J4489 Other specified chronic obstructive pulmonary disease: Secondary | ICD-10-CM

## 2013-09-11 DIAGNOSIS — J42 Unspecified chronic bronchitis: Secondary | ICD-10-CM

## 2013-09-11 DIAGNOSIS — J449 Chronic obstructive pulmonary disease, unspecified: Secondary | ICD-10-CM

## 2013-09-11 MED ORDER — FLUTICASONE FUROATE-VILANTEROL 100-25 MCG/INH IN AEPB: 1.0000 | INHALATION_SPRAY | Freq: Every day | RESPIRATORY_TRACT | Status: DC

## 2013-09-11 NOTE — Patient Instructions (Signed)
Ok to continue Spiriva once daily  Sample Breo ellipta   1 puff then rinse mouth, once daily   Order- [facility] patient would like referral to establish with primary care here dx chronic bronchitis

## 2013-09-11 NOTE — Progress Notes (Signed)
07/31/13- 29 yoF never smoker followed for chronic bronchitis, rhinitis, complicated by HBP FOLLOWS FOR: Pt last seen 2012. c/o:  sob in morning hours x1 month, also has cough with green/yellow mucus x1 week   Son here Dyspnea on exertion  but not very active. Cough with scant green sputum but no fever or sore throat. Son thinks her metered inhaler technique is ineffective. She denies wheeze or chest tightness. CXR 07/17/13 IMPRESSION:  Cardiomegaly.  Mildly tortuous calcified aorta.  No infiltrate, congestive heart failure or pneumothorax. .  Electronically Signed  By: Chauncey Cruel M.D.  On: 07/17/2013 17:25  09/11/13- 35 yoF never smoker followed for chronic bronchitis, rhinitis, complicated by HBP FOLLOWS FOR: Increased in cough since stopped taking spiriva. SOB with activity. Denies CP.  Son here Spiriva seemed to help for a while. Continues to be better. Stopped Symbicort. Son says her inhaler technique for this was poor. We discussed use of a spacer and training. CXR 07/17/13 IMPRESSION:  Cardiomegaly.  Mildly tortuous calcified aorta.  No infiltrate, congestive heart failure or pneumothorax. .  Electronically Signed  By: Chauncey Cruel M.D.  On: 07/17/2013 17:25   ROS-see HPI Constitutional:   No-   weight loss, night sweats, fevers, chills, +fatigue, lassitude. HEENT:   No-  headaches, difficulty swallowing, tooth/dental problems, sore throat,       No-  sneezing, itching, ear ache, nasal congestion, post nasal drip,  CV:  No-   chest pain, orthopnea, PND, swelling in lower extremities, anasarca,                                                     dizziness, palpitations Resp: +shortness of breath with exertion or at rest.              No- productive cough,  + non-productive cough,  No- coughing up of blood.              No-   change in color of mucus.  No- wheezing.   Skin: No-   rash or lesions. GI:  No-   heartburn, indigestion, abdominal pain, nausea, vomiting,  GU:  MS:  No-    joint pain or swelling.  . Neuro-     nothing unusual Psych:  No- change in mood or affect. No depression or anxiety.  No memory loss.  OBJ- Physical Exam General- Alert, Oriented, Affect-appropriate, Distress- none acute, overweight Skin- rash-none, lesions- none, excoriation- none Lymphadenopathy- none Head- atraumatic            Eyes- Gross vision intact, PERRLA, conjunctivae and secretions clear            Ears- Hearing, canals-normal            Nose- Clear, no-Septal dev, mucus, polyps, erosion, perforation             Throat- Mallampati II , mucosa clear , drainage- none, tonsils- atrophic Neck- flexible , trachea midline, no stridor , thyroid nl, carotid no bruit Chest - symmetrical excursion , unlabored           Heart/CV- RRR , no murmur , no gallop  , no rub, nl s1 s2                           - JVD- none ,  edema- none, stasis changes- none, varices- none           Lung- clear to P&A, wheeze- none, cough+ Light , dullness-none, rub- none           Chest wall-  Abd- Br/ Gen/ Rectal- Not done, not indicated Extrem- cyanosis- none, clubbing, none, atrophy- none, strength- nl Neuro- grossly intact to observation

## 2013-09-25 ENCOUNTER — Telehealth: Payer: Self-pay | Admitting: Internal Medicine

## 2013-09-25 MED ORDER — FLUTICASONE FUROATE-VILANTEROL 100-25 MCG/INH IN AEPB: 1.0000 | INHALATION_SPRAY | Freq: Every day | RESPIRATORY_TRACT | Status: DC

## 2013-09-25 NOTE — Telephone Encounter (Signed)
Rx has been sent in. Carrie Crawford is aware. Nothing further is needed.

## 2013-10-01 NOTE — Assessment & Plan Note (Addendum)
Fair control with Spiriva. I think we could get her some benefit from Symbicort if she can use it correctly. We discussed alternatives. Before trying a spacer, we're going to let her try sample [facility], which might be a simpler device for her.

## 2013-10-09 ENCOUNTER — Telehealth: Payer: Self-pay

## 2013-10-09 NOTE — Telephone Encounter (Signed)
The pt's son called hoping to get the patient scheduled with Dr.Leschber as a new pt.  Is this okay?

## 2013-10-10 NOTE — Telephone Encounter (Signed)
ok 

## 2013-10-12 ENCOUNTER — Other Ambulatory Visit: Payer: Self-pay | Admitting: Nurse Practitioner

## 2013-12-01 ENCOUNTER — Ambulatory Visit: Payer: Medicare Other | Admitting: Physician Assistant

## 2013-12-12 ENCOUNTER — Encounter: Payer: Self-pay | Admitting: Internal Medicine

## 2013-12-12 ENCOUNTER — Ambulatory Visit (INDEPENDENT_AMBULATORY_CARE_PROVIDER_SITE_OTHER): Payer: Medicare Other | Admitting: Internal Medicine

## 2013-12-12 VITALS — BP 122/68 | HR 72 | Ht 65.0 in | Wt 196.8 lb

## 2013-12-12 DIAGNOSIS — J449 Chronic obstructive pulmonary disease, unspecified: Secondary | ICD-10-CM

## 2013-12-12 DIAGNOSIS — J310 Chronic rhinitis: Secondary | ICD-10-CM

## 2013-12-12 NOTE — Assessment & Plan Note (Signed)
Plan- sample Dymista

## 2013-12-12 NOTE — Progress Notes (Signed)
07/31/13- 57 yoF never smoker followed for chronic bronchitis, rhinitis, complicated by HBP FOLLOWS FOR: Pt last seen 2012. c/o:  sob in morning hours x1 month, also has cough with green/yellow mucus x1 week   Son here Dyspnea on exertion  but not very active. Cough with scant green sputum but no fever or sore throat. Son thinks her metered inhaler technique is ineffective. She denies wheeze or chest tightness. CXR 07/17/13 IMPRESSION:  Cardiomegaly.  Mildly tortuous calcified aorta.  No infiltrate, congestive heart failure or pneumothorax. .  Electronically Signed  By: Chauncey Cruel M.D.  On: 07/17/2013 17:25  09/11/13- 70 yoF never smoker followed for chronic bronchitis, rhinitis, complicated by HBP FOLLOWS FOR: Increased in cough since stopped taking spiriva. SOB with activity. Denies CP.  Son here Spiriva seemed to help for a while. Continues to be better. Stopped Symbicort. Son says her inhaler technique for this was poor. We discussed use of a spacer and training. CXR 07/17/13 IMPRESSION:  Cardiomegaly.  Mildly tortuous calcified aorta.  No infiltrate, congestive heart failure or pneumothorax. .  Electronically Signed  By: Chauncey Cruel M.D.  On: 07/17/2013 17:25  12/12/13- 42 yoF never smoker followed for chronic bronchitis, rhinitis, complicated by HBP Follows for: Pt states that her cough is unchanged since her last visit. She has noticed that cough is sometimes triggered by talking.  Cough is occ prod with minimal clear sputum.  Breathing is unchanged. No new co's today.  Son here. He is much more bothered by her cough than she is. Cough noted going in and out of doors. Denies reflux.Blames postnasal drip.Uses Nasonex some.  ROS-see HPI Constitutional:   No-   weight loss, night sweats, fevers, chills, +fatigue, lassitude. HEENT:   No-  headaches, difficulty swallowing, tooth/dental problems, sore throat,       No-  sneezing, itching, ear ache, nasal congestion, post nasal drip,  CV:   No-   chest pain, orthopnea, PND, swelling in lower extremities, anasarca,                                                     dizziness, palpitations Resp: +shortness of breath with exertion or at rest.              +productive cough,  + non-productive cough,  No- coughing up of blood.              No-   change in color of mucus.  No- wheezing.   Skin: No-   rash or lesions. GI:  No-   heartburn, indigestion, abdominal pain, nausea, vomiting,  GU:  MS:  No-   joint pain or swelling.  . Neuro-     nothing unusual Psych:  No- change in mood or affect. No depression or anxiety.  No memory loss.  OBJ- Physical Exam General- Alert, Oriented, Affect-appropriate, Distress- none acute, overweight Skin- rash-none, lesions- none, excoriation- none Lymphadenopathy- none Head- atraumatic            Eyes- Gross vision intact, PERRLA, conjunctivae and secretions clear            Ears- Hearing, canals-normal            Nose- Clear, no-Septal dev, mucus, polyps, erosion, perforation             Throat- Mallampati III , mucosa  clear , drainage- none, tonsils- atrophic Neck- flexible , trachea midline, no stridor , thyroid nl, carotid no bruit Chest - symmetrical excursion , unlabored           Heart/CV- RRR , no murmur , no gallop  , no rub, nl s1 s2                           - JVD- none , edema- none, stasis changes- none, varices- none           Lung- few crackles R mid back, wheeze- none, cough+ raspy , dullness-none, rub- none           Chest wall-  Abd- Br/ Gen/ Rectal- Not done, not indicated Extrem- cyanosis- none, clubbing, none, atrophy- none, strength- nl Neuro- grossly intact to observation

## 2013-12-12 NOTE — Assessment & Plan Note (Signed)
She continues Breo and Spiriva.  Plan- reminded of reflux precautions

## 2013-12-12 NOTE — Patient Instructions (Signed)
Ok to continue [place]  Sample Dymista nasal spray    1-2 puffs each nostril once daily at bedtime     Try this instead of Nasonex

## 2013-12-14 ENCOUNTER — Encounter: Payer: Self-pay | Admitting: Internal Medicine

## 2013-12-15 ENCOUNTER — Other Ambulatory Visit (INDEPENDENT_AMBULATORY_CARE_PROVIDER_SITE_OTHER): Payer: Medicare Other

## 2013-12-15 ENCOUNTER — Ambulatory Visit (INDEPENDENT_AMBULATORY_CARE_PROVIDER_SITE_OTHER): Payer: Medicare Other | Admitting: Internal Medicine

## 2013-12-15 ENCOUNTER — Encounter: Payer: Self-pay | Admitting: Internal Medicine

## 2013-12-15 VITALS — BP 120/64 | HR 101 | Temp 97.8°F | Ht 64.0 in | Wt 197.1 lb

## 2013-12-15 DIAGNOSIS — R4189 Other symptoms and signs involving cognitive functions and awareness: Secondary | ICD-10-CM

## 2013-12-15 DIAGNOSIS — M8580 Other specified disorders of bone density and structure, unspecified site: Secondary | ICD-10-CM | POA: Insufficient documentation

## 2013-12-15 DIAGNOSIS — J4489 Other specified chronic obstructive pulmonary disease: Secondary | ICD-10-CM

## 2013-12-15 DIAGNOSIS — E785 Hyperlipidemia, unspecified: Secondary | ICD-10-CM | POA: Insufficient documentation

## 2013-12-15 DIAGNOSIS — J449 Chronic obstructive pulmonary disease, unspecified: Secondary | ICD-10-CM

## 2013-12-15 DIAGNOSIS — F09 Unspecified mental disorder due to known physiological condition: Secondary | ICD-10-CM

## 2013-12-15 DIAGNOSIS — I10 Essential (primary) hypertension: Secondary | ICD-10-CM

## 2013-12-15 LAB — CBC WITH DIFFERENTIAL/PLATELET
BASOS ABS: 0 10*3/uL (ref 0.0–0.1)
Basophils Relative: 0.6 % (ref 0.0–3.0)
EOS ABS: 0.1 10*3/uL (ref 0.0–0.7)
Eosinophils Relative: 2.2 % (ref 0.0–5.0)
HCT: 39.1 % (ref 36.0–46.0)
HEMOGLOBIN: 13.1 g/dL (ref 12.0–15.0)
LYMPHS PCT: 26.3 % (ref 12.0–46.0)
Lymphs Abs: 1.4 10*3/uL (ref 0.7–4.0)
MCHC: 33.6 g/dL (ref 30.0–36.0)
MCV: 93.5 fl (ref 78.0–100.0)
MONOS PCT: 14.6 % — AB (ref 3.0–12.0)
Monocytes Absolute: 0.8 10*3/uL (ref 0.1–1.0)
NEUTROS ABS: 3.1 10*3/uL (ref 1.4–7.7)
NEUTROS PCT: 56.3 % (ref 43.0–77.0)
PLATELETS: 286 10*3/uL (ref 150.0–400.0)
RBC: 4.18 Mil/uL (ref 3.87–5.11)
RDW: 13.4 % (ref 11.5–15.5)
WBC: 5.4 10*3/uL (ref 4.0–10.5)

## 2013-12-15 LAB — TSH: TSH: 1.93 u[IU]/mL (ref 0.35–4.50)

## 2013-12-15 LAB — VITAMIN B12: VITAMIN B 12: 362 pg/mL (ref 211–911)

## 2013-12-15 MED ORDER — LOSARTAN POTASSIUM 100 MG PO TABS: 100.0000 mg | ORAL_TABLET | Freq: Every day | ORAL | Status: DC

## 2013-12-15 NOTE — Progress Notes (Signed)
Pre visit review using our clinic review tool, if applicable. No additional management support is needed unless otherwise documented below in the visit note. 

## 2013-12-15 NOTE — Progress Notes (Signed)
Subjective:    Patient ID: Carrie Crawford, female    DOB: 1926/05/18, 80 y.o.   MRN: [id number]  HPI  Patient is here for establishment of care with new PCP, previously followed and was [place] with Dr. Laurance Flatten  Reviewed chronic medical issues today:  HTN - the patient reports compliance with medication(s) as prescribed. Denies adverse side effects.   COPD/chronic bronchitis - follows with pulmonary Dr. Annamaria Boots for same. Chronic cough symptoms reviewed, denies limitations on activity   Dyslipidemia. Previously prescribed simvastatin February 2014. Discontinued same independently for no clear adverse reason. Strongly reiterate desire to minimize medications   Past Medical History  Diagnosis Date  . Hypertension   . Multiple allergies   . COPD (chronic obstructive pulmonary disease)     chronic bronchitis  . Rhinitis, chronic   . Dyslipidemia   . Osteopenia    Family History  Problem Relation Age of Onset  . Heart disease Brother   . Cancer Daughter     endometial/uterine cancer(nodule in right lung)   History  Substance Use Topics  . Smoking status: Never Smoker   . Smokeless tobacco: Not on file  . Alcohol Use: No    Review of Systems  Constitutional: Positive for fatigue. Negative for fever and unexpected weight change.  Respiratory: Positive for cough. Negative for shortness of breath and wheezing.   Cardiovascular: Negative for chest pain, palpitations and leg swelling.  Gastrointestinal: Negative for nausea, abdominal pain and diarrhea.  Neurological: Negative for dizziness, weakness, light-headedness and headaches.  Psychiatric/Behavioral: Positive for decreased concentration. Negative for suicidal ideas, behavioral problems, sleep disturbance, self-injury, dysphoric mood and agitation. The patient is not nervous/anxious.   All other systems reviewed and are negative.      Objective:   Physical Exam  BP 120/64  Pulse 101  Temp(Src) 97.8 F (36.6 C)  (Oral)  Ht 5\' 4"  (1.626 m)  Wt 197 lb 1.9 oz (89.413 kg)  BMI 33.82 kg/m2  SpO2 95% Wt Readings from Last 3 Encounters:  12/15/13 197 lb 1.9 oz (89.413 kg)  12/12/13 196 lb 12.8 oz (89.268 kg)  09/11/13 193 lb (87.544 kg)   Constitutional: She appears overweight, but well-developed and well-nourished. No distress. son at side HENT: Head: Normocephalic and atraumatic. Ears: B TMs ok, no erythema or effusion; Nose: Nose normal. Mouth/Throat: Oropharynx is clear and moist. No oropharyngeal exudate.  Eyes: Conjunctivae and EOM are normal. Pupils are equal, round, and reactive to light. No scleral icterus.  Neck: Normal range of motion. Neck supple. No JVD present. No thyromegaly present.  Cardiovascular: Normal rate, regular rhythm and normal heart sounds.  No murmur heard. No BLE edema. Pulmonary/Chest: Effort normal and breath sounds normal. No respiratory distress. She has no wheezes.  Abdominal: Soft. Bowel sounds are normal. She exhibits no distension. There is no tenderness. no masses Musculoskeletal: Normal range of motion, no joint effusions. No gross deformities Neurological: She is alert and oriented to person, place, and time. Repetitive conversations with poor short-term recall .No cranial nerve deficit. Coordination, balance, strength, speech and gait are normal.  Skin: Skin is warm and dry. No rash noted. No erythema.  Psychiatric: She has a normal mood and but mildly dysphoric affect. Her behavior is normal. Judgment and thought content normal, but repetitive conversations.    Lab Results  Component Value Date   WBC 3.9 09/01/2012   HGB 13.1 09/01/2012   HCT 39 09/01/2012   PLT 230 09/01/2012   GLUCOSE 83 07/17/2013  CHOL 183 04/26/2013   TRIG 157* 04/26/2013   HDL 46 04/26/2013   LDLCALC 106* 04/26/2013   ALT 12 07/17/2013   AST 18 07/17/2013   NA 134 07/17/2013   K 4.8 07/17/2013   CL 94* 07/17/2013   CREATININE 0.83 07/17/2013   BUN 15 07/17/2013   CO2 27 07/17/2013   TSH 2.04  09/01/2012    Mm Digital Screening  06/12/2009   DG SCREEN MAMMOGRAM BILATERAL Bilateral CC and MLO view(s) were taken.   DIGITAL SCREENING MAMMOGRAM WITH CAD:   Comparison:  Prior studies.   There are scattered fibroglandular densities.  There is no dominant mass, architectural distortion  or calcification to suggest malignancy.   Images were processed with CAD.   IMPRESSION:   No mammographic evidence of malignancy.  Suggest yearly screening mammography.   A result letter of this screening mammogram will be mailed directly to the patient.   ASSESSMENT: Negative - BI-RADS 1   Screening mammogram in 1 year. ,  Provider: Siri Cole      Assessment & Plan:   Problem List Items Addressed This Visit   Cognitive decline     Repetitive conversations and poor recall noted on exam, verified progressive course per family (son) - Patient adamant regarding reluctance for any additional medication -family appears to support same Check labs for reversible causes of dementia Consider future initiation of Aricept or Namenda, but will not prescribe same at this time Support offered to patient and family    Relevant Orders      TSH (Completed)      Vitamin B12 (Completed)      CBC with Differential (Completed)   Dyslipidemia     History of same, previously on simvastatin 2012 but self discontinued Given advanced age and desire for minimization of any medications, advised only diet and weight reduction with exercise/ more movement to help control same    Hypertension - Primary      BP Readings from Last 3 Encounters:  12/15/13 120/64  12/12/13 122/68  09/11/13 138/70   Will change ARB to generic for cost Losartan in place of Benicar - erx done    Relevant Medications      losartan (COZAAR) tablet   Obstructive chronic bronchitis without exacerbation     Chronic cough/chronic bronchitis symptoms reviewed Reports symptoms controlled with current medications Follows with pulmonary semiannually  and as needed Interval history reviewed, no changes recommended by me at this time    Relevant Medications      Azelastine-Fluticasone (DYMISTA) 137-50 MCG/ACT SUSP   Other Relevant Orders      CBC with Differential (Completed)

## 2013-12-15 NOTE — Assessment & Plan Note (Signed)
BP Readings from Last 3 Encounters:  12/15/13 120/64  12/12/13 122/68  09/11/13 138/70   Will change ARB to generic for cost Losartan in place of Benicar - erx done

## 2013-12-15 NOTE — Patient Instructions (Signed)
It was good to see you today.  We have reviewed your prior records including labs and tests today  Test(s) ordered today. Your results will be released to [place] (or called to you) after review, usually within 72hours after test completion. If any changes need to be made, you will be notified at that same time.  Medications reviewed and updated Stop Benicar in place of new medication losartan for blood pressure  No other changes recommended at this time.  Your prescription(s) have been submitted to your pharmacy. Please take as directed and contact our office if you believe you are having problem(s) with the medication(s).  Please schedule followup in 4 months, call sooner if problems.

## 2013-12-17 NOTE — Assessment & Plan Note (Signed)
Chronic cough/chronic bronchitis symptoms reviewed Reports symptoms controlled with current medications Follows with pulmonary semiannually and as needed Interval history reviewed, no changes recommended by me at this time

## 2013-12-17 NOTE — Assessment & Plan Note (Signed)
Repetitive conversations and poor recall noted on exam, verified progressive course per family (son) - Patient adamant regarding reluctance for any additional medication -family appears to support same Check labs for reversible causes of dementia Consider future initiation of Aricept or Namenda, but will not prescribe same at this time Support offered to patient and family

## 2013-12-17 NOTE — Assessment & Plan Note (Signed)
History of same, previously on simvastatin 2012 but self discontinued Given advanced age and desire for minimization of any medications, advised only diet and weight reduction with exercise/ more movement to help control same

## 2014-01-04 ENCOUNTER — Telehealth: Payer: Self-pay | Admitting: Internal Medicine

## 2014-01-04 MED ORDER — AZELASTINE-FLUTICASONE 137-50 MCG/ACT NA SUSP: 1.0000 | Freq: Every day | NASAL | Status: DC

## 2014-01-04 NOTE — Telephone Encounter (Signed)
Called and spoke with pts son--bobby.  He stated that the pt has been using the dymista with out any problems.  This medication seems to be working for her.  Requesting that  A refill be sent to her pharmacy.  This has been done. Nothing further is needed.

## 2014-01-17 ENCOUNTER — Telehealth: Payer: Self-pay | Admitting: Internal Medicine

## 2014-01-17 MED ORDER — AZELASTINE HCL 0.1 % NA SOLN: NASAL | Status: DC

## 2014-01-17 MED ORDER — FLUTICASONE PROPIONATE 50 MCG/ACT NA SUSP: NASAL | Status: DC

## 2014-01-17 NOTE — Telephone Encounter (Signed)
Pt made of of the change in Rx's and will pick up Astelin NS rx and Flonae Rx at her [place]. Nothing more needed.

## 2014-01-18 ENCOUNTER — Other Ambulatory Visit: Payer: Self-pay | Admitting: *Deleted

## 2014-01-18 MED ORDER — HYDROCHLOROTHIAZIDE 25 MG PO TABS: ORAL_TABLET | ORAL | Status: DC

## 2014-01-18 NOTE — Telephone Encounter (Signed)
Son states mom is needing a updated script on her HCTZ was rx by previous md. Inform pt will send to [facility]...Carrie Crawford

## 2014-03-22 ENCOUNTER — Other Ambulatory Visit: Payer: Self-pay | Admitting: Internal Medicine

## 2014-03-26 NOTE — Telephone Encounter (Signed)
Son calling a/b mother's prescript refill for spirivia to have been call to [facility] in [place].Hillery Hunter

## 2014-03-27 NOTE — Telephone Encounter (Signed)
Number is incorrect Refills sent Pt is aware

## 2014-04-05 ENCOUNTER — Telehealth: Payer: Self-pay | Admitting: Internal Medicine

## 2014-04-05 MED ORDER — FLUTICASONE FUROATE-VILANTEROL 100-25 MCG/INH IN AEPB
1.0000 | INHALATION_SPRAY | Freq: Every day | RESPIRATORY_TRACT | Status: DC
Start: 2014-04-05 — End: 2014-10-18

## 2014-04-05 NOTE — Telephone Encounter (Signed)
Called spoke with pt son [name]. Aware RX has been called in. Nothing further needed

## 2014-04-13 ENCOUNTER — Ambulatory Visit: Payer: Medicare Other | Admitting: Internal Medicine

## 2014-04-18 ENCOUNTER — Ambulatory Visit: Payer: Medicare Other | Admitting: Internal Medicine

## 2014-04-19 ENCOUNTER — Ambulatory Visit (INDEPENDENT_AMBULATORY_CARE_PROVIDER_SITE_OTHER): Payer: Medicare Other | Admitting: Internal Medicine

## 2014-04-19 ENCOUNTER — Encounter: Payer: Self-pay | Admitting: Internal Medicine

## 2014-04-19 VITALS — BP 132/64 | HR 73 | Temp 98.3°F | Resp 18 | Ht 64.0 in | Wt 197.8 lb

## 2014-04-19 DIAGNOSIS — E785 Hyperlipidemia, unspecified: Secondary | ICD-10-CM

## 2014-04-19 DIAGNOSIS — J449 Chronic obstructive pulmonary disease, unspecified: Secondary | ICD-10-CM

## 2014-04-19 DIAGNOSIS — Z23 Encounter for immunization: Secondary | ICD-10-CM

## 2014-04-19 DIAGNOSIS — I10 Essential (primary) hypertension: Secondary | ICD-10-CM

## 2014-04-19 NOTE — Progress Notes (Signed)
Pre visit review using our clinic review tool, if applicable. No additional management support is needed unless otherwise documented below in the visit note. 

## 2014-04-19 NOTE — Patient Instructions (Addendum)
We would like you to start exercising more to work on building up the lungs. Start trying to walk for about 20-30 minutes per day. If you get out of breath, rest, then go back to walking.   Exercise to Stay Healthy Exercise helps you become and stay healthy. EXERCISE IDEAS AND TIPS Choose exercises that:  You enjoy.  Fit into your day. You do not need to exercise really hard to be healthy. You can do exercises at a slow or medium level and stay healthy. You can:  Stretch before and after working out.  Try yoga, Pilates, or tai chi.  Lift weights.  Walk fast, swim, jog, run, climb stairs, bicycle, dance, or rollerskate.  Take aerobic classes. Exercises that burn about 150 calories:  Running 1  miles in 15 minutes.  Playing volleyball for 45 to 60 minutes.  Washing and waxing a car for 45 to 60 minutes.  Playing touch football for 45 minutes.  Walking 1  miles in 35 minutes.  Pushing a stroller 1  miles in 30 minutes.  Playing basketball for 30 minutes.  Raking leaves for 30 minutes.  Bicycling 5 miles in 30 minutes.  Walking 2 miles in 30 minutes.  Dancing for 30 minutes.  Shoveling snow for 15 minutes.  Swimming laps for 20 minutes.  Walking up stairs for 15 minutes.  Bicycling 4 miles in 15 minutes.  Gardening for 30 to 45 minutes.  Jumping rope for 15 minutes.  Washing windows or floors for 45 to 60 minutes. Document Released: 08/01/2010 Document Revised: 09/21/2011 Document Reviewed: 08/01/2010 [facility] Patient Information 2015 [place], [place]. This information is not intended to replace advice given to you by your health care provider. Make sure you discuss any questions you have with your health care provider.

## 2014-04-20 NOTE — Assessment & Plan Note (Signed)
Doing well off meds for now.

## 2014-04-20 NOTE — Progress Notes (Signed)
   Subjective:    Patient ID: Carrie Crawford, female    DOB: 30-May-1926, 80 y.o.   MRN: [id number]  HPI The patient is an 80 YO female coming in for a follow up of her blood pressure and her breathing. She was seen about 4 months ago by her PCP and no medicine changes were made. She is doing well today and not having any new problems right now.   Review of Systems  Constitutional: Negative for fever, activity change, appetite change and fatigue.  HENT: Negative.   Respiratory: Positive for shortness of breath. Negative for cough, chest tightness and wheezing.        With long distances  Cardiovascular: Negative for chest pain, palpitations and leg swelling.  Gastrointestinal: Negative for abdominal pain, diarrhea, constipation and abdominal distention.  Neurological: Negative.       Objective:   Physical Exam  Constitutional: She is oriented to person, place, and time. She appears well-developed and well-nourished. No distress.  HENT:  Head: Normocephalic and atraumatic.  Eyes: EOM are normal.  Neck: Normal range of motion.  Cardiovascular: Normal rate and regular rhythm.   Pulmonary/Chest: Effort normal and breath sounds normal. No respiratory distress. She has no wheezes. She has no rales.  Abdominal: Soft. Bowel sounds are normal.  Neurological: She is alert and oriented to person, place, and time.  Skin: Skin is warm and dry.   Filed Vitals:   04/19/14 1326  BP: 132/64  Pulse: 73  Temp: 98.3 F (36.8 C)  TempSrc: Oral  Resp: 18  Height: 5\' 4"  (1.626 m)  Weight: 197 lb 12.8 oz (89.721 kg)  SpO2: 95%      Assessment & Plan:  Given flu shot at today's visit

## 2014-04-20 NOTE — Assessment & Plan Note (Signed)
Doing well on HCTZ and losartan at this time. BP good today.

## 2014-04-20 NOTE — Assessment & Plan Note (Signed)
Taking the breo and spiriva and doing well but she knows that if she does not takes these medicines she cannot breathe as well.

## 2014-05-25 ENCOUNTER — Ambulatory Visit (INDEPENDENT_AMBULATORY_CARE_PROVIDER_SITE_OTHER): Payer: Medicare Other | Admitting: Internal Medicine

## 2014-05-25 ENCOUNTER — Encounter: Payer: Self-pay | Admitting: Internal Medicine

## 2014-05-25 VITALS — BP 140/70 | HR 78 | Ht 66.0 in | Wt 196.0 lb

## 2014-05-25 DIAGNOSIS — J449 Chronic obstructive pulmonary disease, unspecified: Secondary | ICD-10-CM

## 2014-05-25 DIAGNOSIS — J310 Chronic rhinitis: Secondary | ICD-10-CM

## 2014-05-25 DIAGNOSIS — Z23 Encounter for immunization: Secondary | ICD-10-CM

## 2014-05-25 MED ORDER — FLUTICASONE PROPIONATE 50 MCG/ACT NA SUSP: NASAL | Status: DC

## 2014-05-25 NOTE — Progress Notes (Signed)
07/31/13- 80 yoF never smoker followed for chronic bronchitis, rhinitis, complicated by HBP FOLLOWS FOR: Pt last seen 2012. c/o:  sob in morning hours x1 month, also has cough with green/yellow mucus x1 week   Son here Dyspnea on exertion  but not very active. Cough with scant green sputum but no fever or sore throat. Son thinks her metered inhaler technique is ineffective. She denies wheeze or chest tightness. CXR 07/17/13 IMPRESSION:  Cardiomegaly.  Mildly tortuous calcified aorta.  No infiltrate, congestive heart failure or pneumothorax. .  Electronically Signed  By: Chauncey Cruel M.D.  On: 07/17/2013 17:25  09/11/13- 54 yoF never smoker followed for chronic bronchitis, rhinitis, complicated by HBP FOLLOWS FOR: Increased in cough since stopped taking spiriva. SOB with activity. Denies CP.  Son here Spiriva seemed to help for a while. Continues to be better. Stopped Symbicort. Son says her inhaler technique for this was poor. We discussed use of a spacer and training. CXR 07/17/13 IMPRESSION:  Cardiomegaly.  Mildly tortuous calcified aorta.  No infiltrate, congestive heart failure or pneumothorax. .  Electronically Signed  By: Chauncey Cruel M.D.  On: 07/17/2013 17:25  12/12/13- 51 yoF never smoker followed for chronic bronchitis, rhinitis, complicated by HBP Follows for: Pt states that her cough is unchanged since her last visit. She has noticed that cough is sometimes triggered by talking.  Cough is occ prod with minimal clear sputum.  Breathing is unchanged. No new co's today.  Son here. He is much more bothered by her cough than she is. Cough noted going in and out of doors. Denies reflux.Blames postnasal drip.Uses Nasonex some.  05/25/14- 88 yoF never smoker followed for chronic bronchitis, rhinitis, complicated by HBP   Son here  She has had flu vaccine FOLLOW FOR: Bronchitis; no complaints Chronic mild nonproductive cough, increased outdoors in the wind. Son thinks it is better. We  reviewed her medications and discussed Breo, Flonase, and pneumonia vaccine.  ROS-see HPI Constitutional:   No-   weight loss, night sweats, fevers, chills, +fatigue, lassitude. HEENT:   No-  headaches, difficulty swallowing, tooth/dental problems, sore throat,       No-  sneezing, itching, ear ache, nasal congestion, post nasal drip,  CV:  No-   chest pain, orthopnea, PND, swelling in lower extremities, anasarca,                                                     dizziness, palpitations Resp: +shortness of breath with exertion or at rest.              No-productive cough,  + non-productive cough,  No- coughing up of blood.              No-   change in color of mucus.  No- wheezing.   Skin: No-   rash or lesions. GI:  No-   heartburn, indigestion, abdominal pain, nausea, vomiting,  GU:  MS:  No-   joint pain or swelling.  . Neuro-     nothing unusual Psych:  No- change in mood or affect. No depression or anxiety.  No memory loss.  OBJ- Physical Exam General- Alert, Oriented, Affect-appropriate, Distress- none acute, overweight Skin- rash-none, lesions- none, excoriation- none Lymphadenopathy- none Head- atraumatic            Eyes- Gross vision intact, PERRLA,  conjunctivae and secretions clear            Ears- Hearing, canals-normal            Nose- Clear, no-Septal dev, mucus, polyps, erosion, perforation             Throat- Mallampati III , mucosa clear , drainage- none, tonsils- atrophic Neck- flexible , trachea midline, no stridor , thyroid nl, carotid no bruit Chest - symmetrical excursion , unlabored           Heart/CV- RRR , no murmur , no gallop  , no rub, nl s1 s2                           - JVD- none , edema- none, stasis changes- none, varices- none           Lung- clear, wheeze- none, cough+ raspy , dullness-none, rub- none           Chest wall-  Abd- Br/ Gen/ Rectal- Not done, not indicated Extrem- cyanosis- none, clubbing, none, atrophy- none, strength- nl Neuro-  grossly intact to observation

## 2014-05-25 NOTE — Patient Instructions (Signed)
We can continue present meds  Prevnar -13 pneumonia vaccine  Script sent refill for fluticasone

## 2014-05-26 NOTE — Assessment & Plan Note (Signed)
Controlled with Flonase

## 2014-05-26 NOTE — Assessment & Plan Note (Signed)
Okay to continue current meds. Plan-Prevnar 13 with discussion

## 2014-07-23 ENCOUNTER — Telehealth: Payer: Self-pay | Admitting: Internal Medicine

## 2014-07-23 MED ORDER — AZELASTINE HCL 0.1 % NA SOLN: NASAL | Status: DC

## 2014-07-23 NOTE — Telephone Encounter (Signed)
Spoke with the pt son and the pt is needed a refill on nasal spray. Refill sent. [place] Bing, CMA

## 2014-10-02 ENCOUNTER — Telehealth: Payer: Self-pay | Admitting: Internal Medicine

## 2014-10-02 MED ORDER — TIOTROPIUM BROMIDE MONOHYDRATE 18 MCG IN CAPS: ORAL_CAPSULE | RESPIRATORY_TRACT | Status: DC

## 2014-10-02 NOTE — Telephone Encounter (Signed)
Called and spoke to pt's son. Pt needing refill of Spiriva. Rx sent to preferred pharmacy. Pt's son verbalized understanding and denied any further questions or concerns at this time.

## 2014-10-17 ENCOUNTER — Telehealth: Payer: Self-pay | Admitting: Internal Medicine

## 2014-10-17 NOTE — Telephone Encounter (Signed)
LMTCB x 1 - went straight to voicemail

## 2014-10-18 MED ORDER — FLUTICASONE FUROATE-VILANTEROL 100-25 MCG/INH IN AEPB: 1.0000 | INHALATION_SPRAY | Freq: Every day | RESPIRATORY_TRACT | Status: DC

## 2014-10-18 NOTE — Telephone Encounter (Signed)
Spoke with pt's son and advised that refill for Memory Dance  was sent to pharmacy.

## 2014-10-23 ENCOUNTER — Ambulatory Visit (INDEPENDENT_AMBULATORY_CARE_PROVIDER_SITE_OTHER): Payer: Medicare Other | Admitting: Internal Medicine

## 2014-10-23 ENCOUNTER — Encounter: Payer: Self-pay | Admitting: Internal Medicine

## 2014-10-23 ENCOUNTER — Ambulatory Visit: Payer: Medicare Other | Admitting: Internal Medicine

## 2014-10-23 ENCOUNTER — Other Ambulatory Visit (INDEPENDENT_AMBULATORY_CARE_PROVIDER_SITE_OTHER): Payer: Medicare Other

## 2014-10-23 VITALS — BP 128/72 | HR 86 | Temp 98.6°F | Resp 16 | Ht 66.0 in | Wt 200.0 lb

## 2014-10-23 DIAGNOSIS — I10 Essential (primary) hypertension: Secondary | ICD-10-CM | POA: Diagnosis not present

## 2014-10-23 DIAGNOSIS — E785 Hyperlipidemia, unspecified: Secondary | ICD-10-CM | POA: Diagnosis not present

## 2014-10-23 LAB — LIPID PANEL
CHOLESTEROL: 168 mg/dL (ref 0–200)
HDL: 55.9 mg/dL (ref >39.00)
LDL CALC: 91 mg/dL (ref 0–99)
NonHDL: 112.1
TRIGLYCERIDES: 107 mg/dL (ref 0.0–149.0)
Total CHOL/HDL Ratio: 3
VLDL: 21.4 mg/dL (ref 0.0–40.0)

## 2014-10-23 LAB — COMPREHENSIVE METABOLIC PANEL
ALT: 14 U/L (ref 0–35)
AST: 21 U/L (ref 0–37)
Albumin: 3.7 g/dL (ref 3.5–5.2)
Alkaline Phosphatase: 73 U/L (ref 39–117)
BUN: 13 mg/dL (ref 6–23)
CO2: 31 meq/L (ref 19–32)
CREATININE: 0.74 mg/dL (ref 0.40–1.20)
Calcium: 9.5 mg/dL (ref 8.4–10.5)
Chloride: 92 mEq/L — ABNORMAL LOW (ref 96–112)
GFR: 78.99 mL/min (ref >60.00)
Glucose, Bld: 100 mg/dL — ABNORMAL HIGH (ref 70–99)
Potassium: 4 mEq/L (ref 3.5–5.1)
SODIUM: 128 meq/L — AB (ref 135–145)
TOTAL PROTEIN: 8.1 g/dL (ref 6.0–8.3)
Total Bilirubin: 0.5 mg/dL (ref 0.2–1.2)

## 2014-10-23 LAB — CBC WITH DIFFERENTIAL/PLATELET
BASOS PCT: 0.5 % (ref 0.0–3.0)
Basophils Absolute: 0 10*3/uL (ref 0.0–0.1)
Eosinophils Absolute: 0.1 10*3/uL (ref 0.0–0.7)
Eosinophils Relative: 1.6 % (ref 0.0–5.0)
HCT: 39.2 % (ref 36.0–46.0)
HEMOGLOBIN: 13.3 g/dL (ref 12.0–15.0)
LYMPHS PCT: 24 % (ref 12.0–46.0)
Lymphs Abs: 1.6 10*3/uL (ref 0.7–4.0)
MCHC: 34 g/dL (ref 30.0–36.0)
MCV: 90.7 fl (ref 78.0–100.0)
Monocytes Absolute: 0.8 10*3/uL (ref 0.1–1.0)
Monocytes Relative: 13.1 % — ABNORMAL HIGH (ref 3.0–12.0)
NEUTROS ABS: 3.9 10*3/uL (ref 1.4–7.7)
NEUTROS PCT: 60.8 % (ref 43.0–77.0)
Platelets: 278 10*3/uL (ref 150.0–400.0)
RBC: 4.32 Mil/uL (ref 3.87–5.11)
RDW: 13.8 % (ref 11.5–15.5)
WBC: 6.5 10*3/uL (ref 4.0–10.5)

## 2014-10-23 LAB — TSH: TSH: 2.03 u[IU]/mL (ref 0.35–4.50)

## 2014-10-23 NOTE — Patient Instructions (Signed)

## 2014-10-23 NOTE — Progress Notes (Signed)
Pre visit review using our clinic review tool, if applicable. No additional management support is needed unless otherwise documented below in the visit note. 

## 2014-10-24 ENCOUNTER — Encounter: Payer: Self-pay | Admitting: Internal Medicine

## 2014-10-24 MED ORDER — HYDROCHLOROTHIAZIDE 25 MG PO TABS: ORAL_TABLET | ORAL | Status: DC

## 2014-10-24 NOTE — Progress Notes (Signed)
   Subjective:    Patient ID: Carrie Crawford, female    DOB: 11/04/1925, 81 y.o.   MRN: [id number]  Hypertension This is a chronic problem. The current episode started more than 1 year ago. The problem is unchanged. The problem is controlled. Pertinent negatives include no anxiety, blurred vision, chest pain, headaches, malaise/fatigue, neck pain, orthopnea, palpitations, peripheral edema, PND, shortness of breath or sweats. Past treatments include angiotensin blockers and diuretics. The current treatment provides significant improvement. There are no compliance problems.       Review of Systems  Constitutional: Negative.  Negative for fever, chills, malaise/fatigue, diaphoresis, appetite change and fatigue.  HENT: Negative.   Eyes: Negative.  Negative for blurred vision.  Respiratory: Negative.  Negative for cough, choking, chest tightness, shortness of breath and stridor.   Cardiovascular: Negative.  Negative for chest pain, palpitations, orthopnea and PND.  Gastrointestinal: Negative.  Negative for nausea, vomiting, abdominal pain, diarrhea and constipation.  Endocrine: Negative.   Genitourinary: Negative.   Musculoskeletal: Negative.  Negative for back pain, joint swelling, arthralgias and neck pain.  Skin: Negative.  Negative for rash.  Allergic/Immunologic: Negative.   Neurological: Negative.  Negative for dizziness, syncope, speech difficulty, weakness, light-headedness and headaches.  Hematological: Negative.  Negative for adenopathy. Does not bruise/bleed easily.  Psychiatric/Behavioral: Negative.        Objective:   Physical Exam  Constitutional: She is oriented to person, place, and time. She appears well-developed and well-nourished. No distress.  HENT:  Head: Normocephalic and atraumatic.  Mouth/Throat: Oropharynx is clear and moist. No oropharyngeal exudate.  Eyes: Conjunctivae are normal. Right eye exhibits no discharge. Left eye exhibits no discharge. No scleral  icterus.  Neck: Normal range of motion. Neck supple. No JVD present. No tracheal deviation present. No thyromegaly present.  Cardiovascular: Normal rate, regular rhythm, normal heart sounds and intact distal pulses.  Exam reveals no gallop and no friction rub.   No murmur heard. Pulmonary/Chest: Effort normal and breath sounds normal. No stridor. No respiratory distress. She has no wheezes. She has no rales. She exhibits no tenderness.  Abdominal: Soft. Bowel sounds are normal. She exhibits no distension and no mass. There is no tenderness. There is no rebound and no guarding.  Musculoskeletal: Normal range of motion. She exhibits no edema or tenderness.  Lymphadenopathy:    She has no cervical adenopathy.  Neurological: She is oriented to person, place, and time.  Skin: Skin is warm and dry. No rash noted. She is not diaphoretic. No erythema. No pallor.  Psychiatric: She has a normal mood and affect. Her behavior is normal. Judgment and thought content normal.  Vitals reviewed.   Lab Results  Component Value Date   WBC 6.5 10/23/2014   HGB 13.3 10/23/2014   HCT 39.2 10/23/2014   PLT 278.0 10/23/2014   GLUCOSE 100* 10/23/2014   CHOL 168 10/23/2014   TRIG 107.0 10/23/2014   HDL 55.90 10/23/2014   LDLCALC 91 10/23/2014   ALT 14 10/23/2014   AST 21 10/23/2014   NA 128* 10/23/2014   K 4.0 10/23/2014   CL 92* 10/23/2014   CREATININE 0.74 10/23/2014   BUN 13 10/23/2014   CO2 31 10/23/2014   TSH 2.03 10/23/2014       Assessment & Plan:

## 2014-10-24 NOTE — Assessment & Plan Note (Signed)
Her LDL is in the normal range I don't think a statin would be beneficial

## 2014-10-24 NOTE — Assessment & Plan Note (Signed)
Her BP is very well controlled Her Na+ and Cl- are down some so I have asked her to her decrease HCTZ from 25 to 12.5 md QD

## 2014-11-23 ENCOUNTER — Ambulatory Visit: Payer: Medicare Other | Admitting: Internal Medicine

## 2014-11-29 ENCOUNTER — Ambulatory Visit (INDEPENDENT_AMBULATORY_CARE_PROVIDER_SITE_OTHER)
Admission: RE | Admit: 2014-11-29 | Discharge: 2014-11-29 | Disposition: A | Discharge status: Home / Self Care | Payer: Medicare Other | Source: Ambulatory Visit | Attending: Internal Medicine | Admitting: Internal Medicine

## 2014-11-29 ENCOUNTER — Ambulatory Visit (INDEPENDENT_AMBULATORY_CARE_PROVIDER_SITE_OTHER): Payer: Medicare Other | Admitting: Internal Medicine

## 2014-11-29 VITALS — BP 126/84 | HR 74

## 2014-11-29 DIAGNOSIS — J42 Unspecified chronic bronchitis: Secondary | ICD-10-CM

## 2014-11-29 DIAGNOSIS — J310 Chronic rhinitis: Secondary | ICD-10-CM | POA: Diagnosis not present

## 2014-11-29 DIAGNOSIS — J449 Chronic obstructive pulmonary disease, unspecified: Secondary | ICD-10-CM

## 2014-11-29 NOTE — Progress Notes (Signed)
07/31/13- 30 yoF never smoker followed for chronic bronchitis, rhinitis, complicated by HBP FOLLOWS FOR: Pt last seen 2012. c/o:  sob in morning hours x1 month, also has cough with green/yellow mucus x1 week   Son here Dyspnea on exertion  but not very active. Cough with scant green sputum but no fever or sore throat. Son thinks her metered inhaler technique is ineffective. She denies wheeze or chest tightness. CXR 07/17/13 IMPRESSION:  Cardiomegaly.  Mildly tortuous calcified aorta.  No infiltrate, congestive heart failure or pneumothorax. .  Electronically Signed  By: Chauncey Cruel M.D.  On: 07/17/2013 17:25  09/11/13- 69 yoF never smoker followed for chronic bronchitis, rhinitis, complicated by HBP FOLLOWS FOR: Increased in cough since stopped taking spiriva. SOB with activity. Denies CP.  Son here Spiriva seemed to help for a while. Continues to be better. Stopped Symbicort. Son says her inhaler technique for this was poor. We discussed use of a spacer and training. CXR 07/17/13 IMPRESSION:  Cardiomegaly.  Mildly tortuous calcified aorta.  No infiltrate, congestive heart failure or pneumothorax. .  Electronically Signed  By: Chauncey Cruel M.D.  On: 07/17/2013 17:25  12/12/13- 84 yoF never smoker followed for chronic bronchitis, rhinitis, complicated by HBP Follows for: Pt states that her cough is unchanged since her last visit. She has noticed that cough is sometimes triggered by talking.  Cough is occ prod with minimal clear sputum.  Breathing is unchanged. No new co's today.  Son here. He is much more bothered by her cough than she is. Cough noted going in and out of doors. Denies reflux.Blames postnasal drip.Uses Nasonex some.  05/25/14- 88 yoF never smoker followed for chronic bronchitis, rhinitis, complicated by HBP   Son here  She has had flu vaccine FOLLOW FOR: Bronchitis; no complaints Chronic mild nonproductive cough, increased outdoors in the wind. Son thinks it is better. We  reviewed her medications and discussed Breo, Flonase, and pneumonia vaccine.  11/29/14- 88 yoF never smoker followed for chronic bronchitis, rhinitis, complicated by HBP   Son here Reports: doing fine since last visit Using Spiriva and Breo. Not much phlegm. Some increased SOB.  ROS-see HPI Constitutional:   No-   weight loss, night sweats, fevers, chills, +fatigue, lassitude. HEENT:   No-  headaches, difficulty swallowing, tooth/dental problems, sore throat,       No-  sneezing, itching, ear ache, nasal congestion, post nasal drip,  CV:  No-   chest pain, orthopnea, PND, swelling in lower extremities, anasarca,                                                     dizziness, palpitations Resp: +shortness of breath with exertion or at rest.              No-productive cough,  + non-productive cough,  No- coughing up of blood.              No-   change in color of mucus.  No- wheezing.   Skin: No-   rash or lesions. GI:  No-   heartburn, indigestion, abdominal pain, nausea, vomiting,  GU:  MS:  No-   joint pain or swelling.  . Neuro-     nothing unusual Psych:  No- change in mood or affect. No depression or anxiety.  No memory loss.  OBJ- Physical Exam  General- Alert, Oriented, Affect-appropriate, Distress- none acute, overweight Skin- rash-none, lesions- none, excoriation- none Lymphadenopathy- none Head- atraumatic            Eyes- Gross vision intact, PERRLA, conjunctivae and secretions clear            Ears- Hearing, canals-normal            Nose- Clear, no-Septal dev, mucus, polyps, erosion, perforation             Throat- Mallampati III , mucosa clear , drainage- none, tonsils- atrophic Neck- flexible , trachea midline, no stridor , thyroid nl, carotid no bruit Chest - symmetrical excursion , unlabored           Heart/CV- RRR , no murmur , no gallop  , no rub, nl s1 s2                           - JVD- none , edema- none, stasis changes- none, varices- none           Lung- clear,  wheeze- none, cough+ with deep breath , dullness-none, rub- none           Chest wall-  Abd- Br/ Gen/ Rectal- Not done, not indicated Extrem- cyanosis- none, clubbing, none, atrophy- none, strength- nl Neuro- grossly intact to observation

## 2014-11-29 NOTE — Patient Instructions (Signed)
Ok to try dropping off one of the nose sprays, to see if you need both of them. Ok to continue as you are if you need them.  Order CXR   Dx chronic bronchitis

## 2014-12-03 NOTE — Progress Notes (Signed)
Quick Note:  Called and spoke to pt. Reviewed cxr results and recs. Pt voiced understanding and had no further questions. ______

## 2014-12-07 ENCOUNTER — Telehealth: Payer: Self-pay | Admitting: Internal Medicine

## 2014-12-07 MED ORDER — LOSARTAN POTASSIUM 100 MG PO TABS: 100.0000 mg | ORAL_TABLET | Freq: Every day | ORAL | Status: DC

## 2014-12-07 NOTE — Telephone Encounter (Signed)
Approved.  

## 2014-12-07 NOTE — Telephone Encounter (Signed)
Patient needs refill for losartan (COZAAR) 100 MG tablet [[id number]. Pharmacy is [facility] on [address] in [place], [place]

## 2014-12-17 ENCOUNTER — Encounter: Payer: Self-pay | Admitting: Internal Medicine

## 2014-12-17 NOTE — Assessment & Plan Note (Signed)
Chronic bronchitis without exacerbation. Meds appropriate. Plan- CXR

## 2014-12-17 NOTE — Assessment & Plan Note (Signed)
Ok to try off nasal sprays to see if they are helpfull

## 2015-03-06 ENCOUNTER — Other Ambulatory Visit: Payer: Self-pay | Admitting: Internal Medicine

## 2015-03-06 ENCOUNTER — Other Ambulatory Visit: Payer: Self-pay | Admitting: Geriatric Medicine

## 2015-03-06 MED ORDER — LOSARTAN POTASSIUM 100 MG PO TABS: 100.0000 mg | ORAL_TABLET | Freq: Every day | ORAL | Status: DC

## 2015-03-22 ENCOUNTER — Telehealth: Payer: Self-pay | Admitting: *Deleted

## 2015-03-22 DIAGNOSIS — I10 Essential (primary) hypertension: Secondary | ICD-10-CM

## 2015-03-22 MED ORDER — HYDROCHLOROTHIAZIDE 25 MG PO TABS: ORAL_TABLET | ORAL | Status: DC

## 2015-03-22 NOTE — Telephone Encounter (Signed)
Left msg on triage requesting refill on mom HCTZ. Notified son rx sent to [facility]...Johny Chess

## 2015-04-01 ENCOUNTER — Encounter: Payer: Self-pay | Admitting: Internal Medicine

## 2015-04-01 MED ORDER — TIOTROPIUM BROMIDE MONOHYDRATE 18 MCG IN CAPS: ORAL_CAPSULE | RESPIRATORY_TRACT | Status: DC

## 2015-04-08 ENCOUNTER — Telehealth: Payer: Self-pay

## 2015-04-08 NOTE — Telephone Encounter (Signed)
Call to the patient and spoke with son [name]; Stated that he makes apt for the patient because she will forget;  Explained AWV and the son agreed to bring her in at 12:30 for 1:15 apt with Dr. Ronnald Ramp. Call to confirm time as it was not clear on the schedule when they were coming.

## 2015-04-15 ENCOUNTER — Encounter: Payer: Self-pay | Admitting: Internal Medicine

## 2015-04-15 MED ORDER — FLUTICASONE FUROATE-VILANTEROL 100-25 MCG/INH IN AEPB: 1.0000 | INHALATION_SPRAY | Freq: Every day | RESPIRATORY_TRACT | Status: DC

## 2015-04-23 ENCOUNTER — Other Ambulatory Visit (INDEPENDENT_AMBULATORY_CARE_PROVIDER_SITE_OTHER): Payer: Medicare Other

## 2015-04-23 ENCOUNTER — Ambulatory Visit (INDEPENDENT_AMBULATORY_CARE_PROVIDER_SITE_OTHER): Payer: Medicare Other | Admitting: Internal Medicine

## 2015-04-23 ENCOUNTER — Encounter: Payer: Self-pay | Admitting: Internal Medicine

## 2015-04-23 VITALS — BP 134/64 | HR 91 | Temp 98.0°F | Resp 16 | Ht 63.0 in | Wt 196.5 lb

## 2015-04-23 DIAGNOSIS — Z23 Encounter for immunization: Secondary | ICD-10-CM | POA: Diagnosis not present

## 2015-04-23 DIAGNOSIS — M8580 Other specified disorders of bone density and structure, unspecified site: Secondary | ICD-10-CM | POA: Diagnosis not present

## 2015-04-23 DIAGNOSIS — Z0000 Encounter for general adult medical examination without abnormal findings: Secondary | ICD-10-CM | POA: Insufficient documentation

## 2015-04-23 DIAGNOSIS — I10 Essential (primary) hypertension: Secondary | ICD-10-CM

## 2015-04-23 DIAGNOSIS — N3000 Acute cystitis without hematuria: Secondary | ICD-10-CM

## 2015-04-23 DIAGNOSIS — E785 Hyperlipidemia, unspecified: Secondary | ICD-10-CM

## 2015-04-23 LAB — CBC WITH DIFFERENTIAL/PLATELET
Basophils Absolute: 0 10*3/uL (ref 0.0–0.1)
Basophils Relative: 0.6 % (ref 0.0–3.0)
EOS PCT: 1.9 % (ref 0.0–5.0)
Eosinophils Absolute: 0.1 10*3/uL (ref 0.0–0.7)
HCT: 39.6 % (ref 36.0–46.0)
Hemoglobin: 13.4 g/dL (ref 12.0–15.0)
LYMPHS ABS: 1.3 10*3/uL (ref 0.7–4.0)
Lymphocytes Relative: 24.4 % (ref 12.0–46.0)
MCHC: 33.8 g/dL (ref 30.0–36.0)
MCV: 93.1 fl (ref 78.0–100.0)
Monocytes Absolute: 0.7 10*3/uL (ref 0.1–1.0)
Monocytes Relative: 12.7 % — ABNORMAL HIGH (ref 3.0–12.0)
Neutro Abs: 3.3 10*3/uL (ref 1.4–7.7)
Neutrophils Relative %: 60.4 % (ref 43.0–77.0)
PLATELETS: 287 10*3/uL (ref 150.0–400.0)
RBC: 4.26 Mil/uL (ref 3.87–5.11)
RDW: 13.7 % (ref 11.5–15.5)
WBC: 5.5 10*3/uL (ref 4.0–10.5)

## 2015-04-23 LAB — URINALYSIS, ROUTINE W REFLEX MICROSCOPIC
BILIRUBIN URINE: NEGATIVE
Hgb urine dipstick: NEGATIVE
Ketones, ur: NEGATIVE
NITRITE: POSITIVE — AB
RBC / HPF: NONE SEEN (ref 0–?)
SPECIFIC GRAVITY, URINE: 1.01 (ref 1.000–1.030)
Total Protein, Urine: NEGATIVE
Urine Glucose: NEGATIVE
Urobilinogen, UA: 0.2 (ref 0.0–1.0)
pH: 6 (ref 5.0–8.0)

## 2015-04-23 LAB — LIPID PANEL
CHOLESTEROL: 172 mg/dL (ref 0–200)
HDL: 56.3 mg/dL (ref >39.00)
LDL CALC: 89 mg/dL (ref 0–99)
NonHDL: 115.65
TRIGLYCERIDES: 131 mg/dL (ref 0.0–149.0)
Total CHOL/HDL Ratio: 3
VLDL: 26.2 mg/dL (ref 0.0–40.0)

## 2015-04-23 LAB — BASIC METABOLIC PANEL
BUN: 11 mg/dL (ref 6–23)
CHLORIDE: 92 meq/L — AB (ref 96–112)
CO2: 31 mEq/L (ref 19–32)
Calcium: 9.2 mg/dL (ref 8.4–10.5)
Creatinine, Ser: 0.72 mg/dL (ref 0.40–1.20)
GFR: 81.43 mL/min (ref >60.00)
GLUCOSE: 99 mg/dL (ref 70–99)
POTASSIUM: 4.1 meq/L (ref 3.5–5.1)
Sodium: 128 mEq/L — ABNORMAL LOW (ref 135–145)

## 2015-04-23 LAB — TSH: TSH: 2.07 u[IU]/mL (ref 0.35–4.50)

## 2015-04-23 NOTE — Patient Instructions (Signed)
Preventive Care for Adults, Female A healthy lifestyle and preventive care can promote health and wellness. Preventive health guidelines for women include the following key practices.  A routine yearly physical is a good way to check with your health care provider about your health and preventive screening. It is a chance to share any concerns and updates on your health and to receive a thorough exam.  Visit your dentist for a routine exam and preventive care every 6 months. Brush your teeth twice a day and floss once a day. Good oral hygiene prevents tooth decay and gum disease.  The frequency of eye exams is based on your age, health, family medical history, use of contact lenses, and other factors. Follow your health care provider's recommendations for frequency of eye exams.  Eat a healthy diet. Foods like vegetables, fruits, whole grains, low-fat dairy products, and lean protein foods contain the nutrients you need without too many calories. Decrease your intake of foods high in solid fats, added sugars, and salt. Eat the right amount of calories for you.Get information about a proper diet from your health care provider, if necessary.  Regular physical exercise is one of the most important things you can do for your health. Most adults should get at least 150 minutes of moderate-intensity exercise (any activity that increases your heart rate and causes you to sweat) each week. In addition, most adults need muscle-strengthening exercises on 2 or more days a week.  Maintain a healthy weight. The body mass index (BMI) is a screening tool to identify possible weight problems. It provides an estimate of body fat based on height and weight. Your health care provider can find your BMI and can help you achieve or maintain a healthy weight.For adults 20 years and older:  A BMI below 18.5 is considered underweight.  A BMI of 18.5 to 24.9 is normal.  A BMI of 25 to 29.9 is considered overweight.  A  BMI of 30 and above is considered obese.  Maintain normal blood lipids and cholesterol levels by exercising and minimizing your intake of saturated fat. Eat a balanced diet with plenty of fruit and vegetables. Blood tests for lipids and cholesterol should begin at age 45 and be repeated every 5 years. If your lipid or cholesterol levels are high, you are over 50, or you are at high risk for heart disease, you may need your cholesterol levels checked more frequently.Ongoing high lipid and cholesterol levels should be treated with medicines if diet and exercise are not working.  If you smoke, find out from your health care provider how to quit. If you do not use tobacco, do not start.  Lung cancer screening is recommended for adults aged 45-80 years who are at high risk for developing lung cancer because of a history of smoking. A yearly low-dose CT scan of the lungs is recommended for people who have at least a 30-pack-year history of smoking and are a current smoker or have quit within the past 15 years. A pack year of smoking is smoking an average of 1 pack of cigarettes a day for 1 year (for example: 1 pack a day for 30 years or 2 packs a day for 15 years). Yearly screening should continue until the smoker has stopped smoking for at least 15 years. Yearly screening should be stopped for people who develop a health problem that would prevent them from having lung cancer treatment.  If you are pregnant, do not drink alcohol. If you are  breastfeeding, be very cautious about drinking alcohol. If you are not pregnant and choose to drink alcohol, do not have more than 1 drink per day. One drink is considered to be 12 ounces (355 mL) of beer, 5 ounces (148 mL) of wine, or 1.5 ounces (44 mL) of liquor.  Avoid use of street drugs. Do not share needles with anyone. Ask for help if you need support or instructions about stopping the use of drugs.  High blood pressure causes heart disease and increases the risk  of stroke. Your blood pressure should be checked at least every 1 to 2 years. Ongoing high blood pressure should be treated with medicines if weight loss and exercise do not work.  If you are 55-79 years old, ask your health care provider if you should take aspirin to prevent strokes.  Diabetes screening is done by taking a blood sample to check your blood glucose level after you have not eaten for a certain period of time (fasting). If you are not overweight and you do not have risk factors for diabetes, you should be screened once every 3 years starting at age 45. If you are overweight or obese and you are 40-70 years of age, you should be screened for diabetes every year as part of your cardiovascular risk assessment.  Breast cancer screening is essential preventive care for women. You should practice "breast self-awareness." This means understanding the normal appearance and feel of your breasts and may include breast self-examination. Any changes detected, no matter how small, should be reported to a health care provider. Women in their 20s and 30s should have a clinical breast exam (CBE) by a health care provider as part of a regular health exam every 1 to 3 years. After age 40, women should have a CBE every year. Starting at age 40, women should consider having a mammogram (breast X-ray test) every year. Women who have a family history of breast cancer should talk to their health care provider about genetic screening. Women at a high risk of breast cancer should talk to their health care providers about having an MRI and a mammogram every year.  Breast cancer gene (BRCA)-related cancer risk assessment is recommended for women who have family members with BRCA-related cancers. BRCA-related cancers include breast, ovarian, tubal, and peritoneal cancers. Having family members with these cancers may be associated with an increased risk for harmful changes (mutations) in the breast cancer genes BRCA1 and  BRCA2. Results of the assessment will determine the need for genetic counseling and BRCA1 and BRCA2 testing.  Your health care provider may recommend that you be screened regularly for cancer of the pelvic organs (ovaries, uterus, and vagina). This screening involves a pelvic examination, including checking for microscopic changes to the surface of your cervix (Pap test). You may be encouraged to have this screening done every 3 years, beginning at age 21.  For women ages 30-65, health care providers may recommend pelvic exams and Pap testing every 3 years, or they may recommend the Pap and pelvic exam, combined with testing for human papilloma virus (HPV), every 5 years. Some types of HPV increase your risk of cervical cancer. Testing for HPV may also be done on women of any age with unclear Pap test results.  Other health care providers may not recommend any screening for nonpregnant women who are considered low risk for pelvic cancer and who do not have symptoms. Ask your health care provider if a screening pelvic exam is right for   you.  If you have had past treatment for cervical cancer or a condition that could lead to cancer, you need Pap tests and screening for cancer for at least 20 years after your treatment. If Pap tests have been discontinued, your risk factors (such as having a new sexual partner) need to be reassessed to determine if screening should resume. Some women have medical problems that increase the chance of getting cervical cancer. In these cases, your health care provider may recommend more frequent screening and Pap tests.  Colorectal cancer can be detected and often prevented. Most routine colorectal cancer screening begins at the age of 50 years and continues through age 75 years. However, your health care provider may recommend screening at an earlier age if you have risk factors for colon cancer. On a yearly basis, your health care provider may provide home test kits to check  for hidden blood in the stool. Use of a small camera at the end of a tube, to directly examine the colon (sigmoidoscopy or colonoscopy), can detect the earliest forms of colorectal cancer. Talk to your health care provider about this at age 50, when routine screening begins. Direct exam of the colon should be repeated every 5-10 years through age 75 years, unless early forms of precancerous polyps or small growths are found.  People who are at an increased risk for hepatitis B should be screened for this virus. You are considered at high risk for hepatitis B if:  You were born in a country where hepatitis B occurs often. Talk with your health care provider about which countries are considered high risk.  Your parents were born in a high-risk country and you have not received a shot to protect against hepatitis B (hepatitis B vaccine).  You have HIV or AIDS.  You use needles to inject street drugs.  You live with, or have sex with, someone who has hepatitis B.  You get hemodialysis treatment.  You take certain medicines for conditions like cancer, organ transplantation, and autoimmune conditions.  Hepatitis C blood testing is recommended for all people born from 1945 through 1965 and any individual with known risks for hepatitis C.  Practice safe sex. Use condoms and avoid high-risk sexual practices to reduce the spread of sexually transmitted infections (STIs). STIs include gonorrhea, chlamydia, syphilis, trichomonas, herpes, HPV, and human immunodeficiency virus (HIV). Herpes, HIV, and HPV are viral illnesses that have no cure. They can result in disability, cancer, and death.  You should be screened for sexually transmitted illnesses (STIs) including gonorrhea and chlamydia if:  You are sexually active and are younger than 24 years.  You are older than 24 years and your health care provider tells you that you are at risk for this type of infection.  Your sexual activity has changed  since you were last screened and you are at an increased risk for chlamydia or gonorrhea. Ask your health care provider if you are at risk.  If you are at risk of being infected with HIV, it is recommended that you take a prescription medicine daily to prevent HIV infection. This is called preexposure prophylaxis (PrEP). You are considered at risk if:  You are sexually active and do not regularly use condoms or know the HIV status of your partner(s).  You take drugs by injection.  You are sexually active with a partner who has HIV.  Talk with your health care provider about whether you are at high risk of being infected with HIV. If   you choose to begin PrEP, you should first be tested for HIV. You should then be tested every 3 months for as long as you are taking PrEP.  Osteoporosis is a disease in which the bones lose minerals and strength with aging. This can result in serious bone fractures or breaks. The risk of osteoporosis can be identified using a bone density scan. Women ages 67 years and over and women at risk for fractures or osteoporosis should discuss screening with their health care providers. Ask your health care provider whether you should take a calcium supplement or vitamin D to reduce the rate of osteoporosis.  Menopause can be associated with physical symptoms and risks. Hormone replacement therapy is available to decrease symptoms and risks. You should talk to your health care provider about whether hormone replacement therapy is right for you.  Use sunscreen. Apply sunscreen liberally and repeatedly throughout the day. You should seek shade when your shadow is shorter than you. Protect yourself by wearing long sleeves, pants, a wide-brimmed hat, and sunglasses year round, whenever you are outdoors.  Once a month, do a whole body skin exam, using a mirror to look at the skin on your back. Tell your health care provider of new moles, moles that have irregular borders, moles that  are larger than a pencil eraser, or moles that have changed in shape or color.  Stay current with required vaccines (immunizations).  Influenza vaccine. All adults should be immunized every year.  Tetanus, diphtheria, and acellular pertussis (Td, Tdap) vaccine. Pregnant women should receive 1 dose of Tdap vaccine during each pregnancy. The dose should be obtained regardless of the length of time since the last dose. Immunization is preferred during the 27th-36th week of gestation. An adult who has not previously received Tdap or who does not know her vaccine status should receive 1 dose of Tdap. This initial dose should be followed by tetanus and diphtheria toxoids (Td) booster doses every 10 years. Adults with an unknown or incomplete history of completing a 3-dose immunization series with Td-containing vaccines should begin or complete a primary immunization series including a Tdap dose. Adults should receive a Td booster every 10 years.  Varicella vaccine. An adult without evidence of immunity to varicella should receive 2 doses or a second dose if she has previously received 1 dose. Pregnant females who do not have evidence of immunity should receive the first dose after pregnancy. This first dose should be obtained before leaving the health care facility. The second dose should be obtained 4-8 weeks after the first dose.  Human papillomavirus (HPV) vaccine. Females aged 13-26 years who have not received the vaccine previously should obtain the 3-dose series. The vaccine is not recommended for use in pregnant females. However, pregnancy testing is not needed before receiving a dose. If a female is found to be pregnant after receiving a dose, no treatment is needed. In that case, the remaining doses should be delayed until after the pregnancy. Immunization is recommended for any person with an immunocompromised condition through the age of 61 years if she did not get any or all doses earlier. During the  3-dose series, the second dose should be obtained 4-8 weeks after the first dose. The third dose should be obtained 24 weeks after the first dose and 16 weeks after the second dose.  Zoster vaccine. One dose is recommended for adults aged 30 years or older unless certain conditions are present.  Measles, mumps, and rubella (MMR) vaccine. Adults born  before 1957 generally are considered immune to measles and mumps. Adults born in 1957 or later should have 1 or more doses of MMR vaccine unless there is a contraindication to the vaccine or there is laboratory evidence of immunity to each of the three diseases. A routine second dose of MMR vaccine should be obtained at least 28 days after the first dose for students attending postsecondary schools, health care workers, or international travelers. People who received inactivated measles vaccine or an unknown type of measles vaccine during 1963-1967 should receive 2 doses of MMR vaccine. People who received inactivated mumps vaccine or an unknown type of mumps vaccine before 1979 and are at high risk for mumps infection should consider immunization with 2 doses of MMR vaccine. For females of childbearing age, rubella immunity should be determined. If there is no evidence of immunity, females who are not pregnant should be vaccinated. If there is no evidence of immunity, females who are pregnant should delay immunization until after pregnancy. Unvaccinated health care workers born before 1957 who lack laboratory evidence of measles, mumps, or rubella immunity or laboratory confirmation of disease should consider measles and mumps immunization with 2 doses of MMR vaccine or rubella immunization with 1 dose of MMR vaccine.  Pneumococcal 13-valent conjugate (PCV13) vaccine. When indicated, a person who is uncertain of his immunization history and has no record of immunization should receive the PCV13 vaccine. All adults 65 years of age and older should receive this  vaccine. An adult aged 19 years or older who has certain medical conditions and has not been previously immunized should receive 1 dose of PCV13 vaccine. This PCV13 should be followed with a dose of pneumococcal polysaccharide (PPSV23) vaccine. Adults who are at high risk for pneumococcal disease should obtain the PPSV23 vaccine at least 8 weeks after the dose of PCV13 vaccine. Adults older than 79 years of age who have normal immune system function should obtain the PPSV23 vaccine dose at least 1 year after the dose of PCV13 vaccine.  Pneumococcal polysaccharide (PPSV23) vaccine. When PCV13 is also indicated, PCV13 should be obtained first. All adults aged 65 years and older should be immunized. An adult younger than age 65 years who has certain medical conditions should be immunized. Any person who resides in a nursing home or long-term care facility should be immunized. An adult smoker should be immunized. People with an immunocompromised condition and certain other conditions should receive both PCV13 and PPSV23 vaccines. People with human immunodeficiency virus (HIV) infection should be immunized as soon as possible after diagnosis. Immunization during chemotherapy or radiation therapy should be avoided. Routine use of PPSV23 vaccine is not recommended for American Indians, [place] Natives, or people younger than 65 years unless there are medical conditions that require PPSV23 vaccine. When indicated, people who have unknown immunization and have no record of immunization should receive PPSV23 vaccine. One-time revaccination 5 years after the first dose of PPSV23 is recommended for people aged 19-64 years who have chronic kidney failure, nephrotic syndrome, asplenia, or immunocompromised conditions. People who received 1-2 doses of PPSV23 before age 65 years should receive another dose of PPSV23 vaccine at age 65 years or later if at least 5 years have passed since the previous dose. Doses of PPSV23 are not  needed for people immunized with PPSV23 at or after age 65 years.  Meningococcal vaccine. Adults with asplenia or persistent complement component deficiencies should receive 2 doses of quadrivalent meningococcal conjugate (MenACWY-D) vaccine. The doses should be obtained   at least 2 months apart. Microbiologists working with certain meningococcal bacteria, [place] recruits, people at risk during an outbreak, and people who travel to or live in countries with a high rate of meningitis should be immunized. A first-year college student up through age 34 years who is living in a residence hall should receive a dose if she did not receive a dose on or after her 16th birthday. Adults who have certain high-risk conditions should receive one or more doses of vaccine.  Hepatitis A vaccine. Adults who wish to be protected from this disease, have certain high-risk conditions, work with hepatitis A-infected animals, work in hepatitis A research labs, or travel to or work in countries with a high rate of hepatitis A should be immunized. Adults who were previously unvaccinated and who anticipate close contact with an international adoptee during the first 60 days after arrival in the [place] from a country with a high rate of hepatitis A should be immunized.  Hepatitis B vaccine. Adults who wish to be protected from this disease, have certain high-risk conditions, may be exposed to blood or other infectious body fluids, are household contacts or sex partners of hepatitis B positive people, are clients or workers in certain care facilities, or travel to or work in countries with a high rate of hepatitis B should be immunized.  Haemophilus influenzae type b (Hib) vaccine. A previously unvaccinated person with asplenia or sickle cell disease or having a scheduled splenectomy should receive 1 dose of Hib vaccine. Regardless of previous immunization, a recipient of a hematopoietic stem cell transplant should receive a  3-dose series 6-12 months after her successful transplant. Hib vaccine is not recommended for adults with HIV infection. Preventive Services / Frequency Ages 35 to 4 years  Blood pressure check.** / Every 3-5 years.  Lipid and cholesterol check.** / Every 5 years beginning at age 60.  Clinical breast exam.** / Every 3 years for women in their 71s and 10s.  BRCA-related cancer risk assessment.** / For women who have family members with a BRCA-related cancer (breast, ovarian, tubal, or peritoneal cancers).  Pap test.** / Every 2 years from ages 76 through 26. Every 3 years starting at age 61 through age 76 or 93 with a history of 3 consecutive normal Pap tests.  HPV screening.** / Every 3 years from ages 37 through ages 60 to 51 with a history of 3 consecutive normal Pap tests.  Hepatitis C blood test.** / For any individual with known risks for hepatitis C.  Skin self-exam. / Monthly.  Influenza vaccine. / Every year.  Tetanus, diphtheria, and acellular pertussis (Tdap, Td) vaccine.** / Consult your health care provider. Pregnant women should receive 1 dose of Tdap vaccine during each pregnancy. 1 dose of Td every 10 years.  Varicella vaccine.** / Consult your health care provider. Pregnant females who do not have evidence of immunity should receive the first dose after pregnancy.  HPV vaccine. / 3 doses over 6 months, if 93 and younger. The vaccine is not recommended for use in pregnant females. However, pregnancy testing is not needed before receiving a dose.  Measles, mumps, rubella (MMR) vaccine.** / You need at least 1 dose of MMR if you were born in 1957 or later. You may also need a 2nd dose. For females of childbearing age, rubella immunity should be determined. If there is no evidence of immunity, females who are not pregnant should be vaccinated. If there is no evidence of immunity, females who are  pregnant should delay immunization until after pregnancy.  Pneumococcal  13-valent conjugate (PCV13) vaccine.** / Consult your health care provider.  Pneumococcal polysaccharide (PPSV23) vaccine.** / 1 to 2 doses if you smoke cigarettes or if you have certain conditions.  Meningococcal vaccine.** / 1 dose if you are age 68 to 8 years and a Market researcher living in a residence hall, or have one of several medical conditions, you need to get vaccinated against meningococcal disease. You may also need additional booster doses.  Hepatitis A vaccine.** / Consult your health care provider.  Hepatitis B vaccine.** / Consult your health care provider.  Haemophilus influenzae type b (Hib) vaccine.** / Consult your health care provider. Ages 7 to 53 years  Blood pressure check.** / Every year.  Lipid and cholesterol check.** / Every 5 years beginning at age 25 years.  Lung cancer screening. / Every year if you are aged 11-80 years and have a 30-pack-year history of smoking and currently smoke or have quit within the past 15 years. Yearly screening is stopped once you have quit smoking for at least 15 years or develop a health problem that would prevent you from having lung cancer treatment.  Clinical breast exam.** / Every year after age 48 years.  BRCA-related cancer risk assessment.** / For women who have family members with a BRCA-related cancer (breast, ovarian, tubal, or peritoneal cancers).  Mammogram.** / Every year beginning at age 41 years and continuing for as long as you are in good health. Consult with your health care provider.  Pap test.** / Every 3 years starting at age 65 years through age 37 or 70 years with a history of 3 consecutive normal Pap tests.  HPV screening.** / Every 3 years from ages 72 years through ages 60 to 40 years with a history of 3 consecutive normal Pap tests.  Fecal occult blood test (FOBT) of stool. / Every year beginning at age 21 years and continuing until age 5 years. You may not need to do this test if you get  a colonoscopy every 10 years.  Flexible sigmoidoscopy or colonoscopy.** / Every 5 years for a flexible sigmoidoscopy or every 10 years for a colonoscopy beginning at age 35 years and continuing until age 48 years.  Hepatitis C blood test.** / For all people born from [birth year] through 1965 and any individual with known risks for hepatitis C.  Skin self-exam. / Monthly.  Influenza vaccine. / Every year.  Tetanus, diphtheria, and acellular pertussis (Tdap/Td) vaccine.** / Consult your health care provider. Pregnant women should receive 1 dose of Tdap vaccine during each pregnancy. 1 dose of Td every 10 years.  Varicella vaccine.** / Consult your health care provider. Pregnant females who do not have evidence of immunity should receive the first dose after pregnancy.  Zoster vaccine.** / 1 dose for adults aged 30 years or older.  Measles, mumps, rubella (MMR) vaccine.** / You need at least 1 dose of MMR if you were born in 1957 or later. You may also need a second dose. For females of childbearing age, rubella immunity should be determined. If there is no evidence of immunity, females who are not pregnant should be vaccinated. If there is no evidence of immunity, females who are pregnant should delay immunization until after pregnancy.  Pneumococcal 13-valent conjugate (PCV13) vaccine.** / Consult your health care provider.  Pneumococcal polysaccharide (PPSV23) vaccine.** / 1 to 2 doses if you smoke cigarettes or if you have certain conditions.  Meningococcal vaccine.** /  Consult your health care provider.  Hepatitis A vaccine.** / Consult your health care provider.  Hepatitis B vaccine.** / Consult your health care provider.  Haemophilus influenzae type b (Hib) vaccine.** / Consult your health care provider. Ages 64 years and over  Blood pressure check.** / Every year.  Lipid and cholesterol check.** / Every 5 years beginning at age 23 years.  Lung cancer screening. / Every year if you  are aged 16-80 years and have a 30-pack-year history of smoking and currently smoke or have quit within the past 15 years. Yearly screening is stopped once you have quit smoking for at least 15 years or develop a health problem that would prevent you from having lung cancer treatment.  Clinical breast exam.** / Every year after age 74 years.  BRCA-related cancer risk assessment.** / For women who have family members with a BRCA-related cancer (breast, ovarian, tubal, or peritoneal cancers).  Mammogram.** / Every year beginning at age 44 years and continuing for as long as you are in good health. Consult with your health care provider.  Pap test.** / Every 3 years starting at age 58 years through age 22 or 39 years with 3 consecutive normal Pap tests. Testing can be stopped between 65 and 70 years with 3 consecutive normal Pap tests and no abnormal Pap or HPV tests in the past 10 years.  HPV screening.** / Every 3 years from ages 64 years through ages 70 or 61 years with a history of 3 consecutive normal Pap tests. Testing can be stopped between 65 and 70 years with 3 consecutive normal Pap tests and no abnormal Pap or HPV tests in the past 10 years.  Fecal occult blood test (FOBT) of stool. / Every year beginning at age 40 years and continuing until age 27 years. You may not need to do this test if you get a colonoscopy every 10 years.  Flexible sigmoidoscopy or colonoscopy.** / Every 5 years for a flexible sigmoidoscopy or every 10 years for a colonoscopy beginning at age 7 years and continuing until age 32 years.  Hepatitis C blood test.** / For all people born from [birth year] through 1963 and any individual with known risks for hepatitis C.  Osteoporosis screening.** / A one-time screening for women ages 30 years and over and women at risk for fractures or osteoporosis.  Skin self-exam. / Monthly.  Influenza vaccine. / Every year.  Tetanus, diphtheria, and acellular pertussis (Tdap/Td)  vaccine.** / 1 dose of Td every 10 years.  Varicella vaccine.** / Consult your health care provider.  Zoster vaccine.** / 1 dose for adults aged 35 years or older.  Pneumococcal 13-valent conjugate (PCV13) vaccine.** / Consult your health care provider.  Pneumococcal polysaccharide (PPSV23) vaccine.** / 1 dose for all adults aged 46 years and older.  Meningococcal vaccine.** / Consult your health care provider.  Hepatitis A vaccine.** / Consult your health care provider.  Hepatitis B vaccine.** / Consult your health care provider.  Haemophilus influenzae type b (Hib) vaccine.** / Consult your health care provider. ** Family history and personal history of risk and conditions may change your health care provider's recommendations.   This information is not intended to replace advice given to you by your health care provider. Make sure you discuss any questions you have with your health care provider.   Document Released: 08/25/2001 Document Revised: 07/20/2014 Document Reviewed: 11/24/2010 Elsevier Interactive Patient Education [employer].

## 2015-04-23 NOTE — Progress Notes (Signed)
Subjective:  Patient ID: Carrie Crawford, female    DOB: [birth year]  Age: 81 y.o. MRN: [id number]  CC: Medicare Wellness; Annual Exam; Hypertension; and Hyperlipidemia   HPI Carrie Crawford presents for f/up on HTN and high cholesterol - she also reports urinary symptoms  History Carrie Crawford has a past medical history of Hypertension; Multiple allergies; COPD (chronic obstructive pulmonary disease) ([place]); Rhinitis, chronic; Dyslipidemia; and Osteopenia.   She has past surgical history that includes Cholecystectomy and Thyroidectomy, partial (2004).   Carrie Crawford family history includes Cancer in Carrie Crawford daughter; Heart disease in Carrie Crawford brother.She reports that she has never smoked. She does not have any smokeless tobacco history on file. She reports that she does not drink alcohol or use illicit drugs.  Outpatient Prescriptions Prior to Visit  Medication Sig Dispense Refill  . aspirin 81 MG tablet Take 81 mg by mouth daily.      Marland Kitchen azelastine (ASTELIN) 0.1 % nasal spray 1-2 sprays in each nostril at bedtime. 30 mL 6  . Fluticasone Furoate-Vilanterol (BREO ELLIPTA) 100-25 MCG/INH AEPB Inhale 1 puff into the lungs daily. 60 each 2  . hydrochlorothiazide (HYDRODIURIL) 25 MG tablet TAKE 1/2 TABLET BY MOUTH ONCE DAILY 45 tablet 1  . losartan (COZAAR) 100 MG tablet Take 1 tablet (100 mg total) by mouth daily. 90 tablet 3  . Multiple Vitamins-Minerals (CENTRUM SILVER) tablet Take 1 tablet by mouth daily.      Marland Kitchen tiotropium (SPIRIVA HANDIHALER) 18 MCG inhalation capsule INHALE THE CONTENTS OF ONE CAPSULE ONCE DAILY 30 capsule 5  . sodium chloride (OCEAN) 0.65 % SOLN nasal spray Place 1 spray into both nostrils as needed for congestion.    . fluticasone (FLONASE) 50 MCG/ACT nasal spray 1-2 sprays in each nostril at bedtime (Patient not taking: Reported on 04/23/2015) 16 g prn   No facility-administered medications prior to visit.    ROS Review of Systems  Constitutional: Negative.  Negative for fever, chills,  diaphoresis, appetite change and fatigue.  HENT: Negative.   Eyes: Negative.   Respiratory: Negative.  Negative for cough, choking, chest tightness, shortness of breath and stridor.   Cardiovascular: Negative.  Negative for chest pain, palpitations and leg swelling.  Gastrointestinal: Negative.  Negative for nausea, vomiting, abdominal pain, diarrhea and constipation.  Endocrine: Negative.   Genitourinary: Positive for dysuria, urgency and frequency. Negative for hematuria, flank pain, decreased urine volume, enuresis, difficulty urinating, pelvic pain and dyspareunia.  Musculoskeletal: Negative.  Negative for myalgias, back pain, arthralgias and neck pain.  Skin: Negative.   Allergic/Immunologic: Negative.   Neurological: Negative.  Negative for dizziness, tremors, syncope, light-headedness, numbness and headaches.  Hematological: Negative.  Negative for adenopathy. Does not bruise/bleed easily.  Psychiatric/Behavioral: Negative.     Objective:  BP 134/64 mmHg  Pulse 91  Temp(Src) 98 F (36.7 C) (Oral)  Ht '5\' 3"'$  (1.6 m)  Wt 196 lb 8 oz (89.132 kg)  BMI 34.82 kg/m2  SpO2 93%  Physical Exam  Constitutional: She is oriented to person, place, and time. She appears well-developed and well-nourished.  Non-toxic appearance. She does not have a sickly appearance. She does not appear ill. No distress.  HENT:  Head: Normocephalic and atraumatic.  Mouth/Throat: Oropharynx is clear and moist. No oropharyngeal exudate.  Eyes: Conjunctivae are normal. Right eye exhibits no discharge. Left eye exhibits no discharge. No scleral icterus.  Neck: Normal range of motion. Neck supple. No JVD present. No tracheal deviation present. No thyromegaly present.  Cardiovascular: Normal rate, regular rhythm, normal  heart sounds and intact distal pulses.  Exam reveals no gallop and no friction rub.   No murmur heard. Pulmonary/Chest: Effort normal and breath sounds normal. No stridor. No respiratory distress.  She has no wheezes. She has no rales. She exhibits no tenderness.  Abdominal: Soft. Bowel sounds are normal. She exhibits no distension and no mass. There is no tenderness. There is no rebound and no guarding.  Musculoskeletal: Normal range of motion. She exhibits no edema or tenderness.  Lymphadenopathy:    She has no cervical adenopathy.  Neurological: She is oriented to person, place, and time.  Skin: Skin is warm and dry. No rash noted. She is not diaphoretic. No erythema. No pallor.  Psychiatric: She has a normal mood and affect. Carrie Crawford behavior is normal. Judgment and thought content normal.  Vitals reviewed.   Lab Results  Component Value Date   WBC 5.5 04/23/2015   HGB 13.4 04/23/2015   HCT 39.6 04/23/2015   PLT 287.0 04/23/2015   GLUCOSE 99 04/23/2015   CHOL 172 04/23/2015   TRIG 131.0 04/23/2015   HDL 56.30 04/23/2015   LDLCALC 89 04/23/2015   ALT 14 10/23/2014   AST 21 10/23/2014   NA 128* 04/23/2015   K 4.1 04/23/2015   CL 92* 04/23/2015   CREATININE 0.72 04/23/2015   BUN 11 04/23/2015   CO2 31 04/23/2015   TSH 2.07 04/23/2015    Assessment & Plan:   Carrie Crawford was seen today for medicare wellness, annual exam, hypertension and hyperlipidemia.  Diagnoses and all orders for this visit:  Essential hypertension- Carrie Crawford BP is well controlled, lytes and renal function are stable -     Basic metabolic panel; Future -     CBC with Differential/Platelet; Future -     Urinalysis, Routine w reflex microscopic (not at [facility]); Future  Osteopenia  Hyperlipidemia with target LDL less than 130- she has achieved Carrie Crawford LDL goal and is doing well on the statin -     Lipid panel; Future -     TSH; Future  Routine general medical examination at a health care facility  Need for prophylactic vaccination against Streptococcus pneumoniae (pneumococcus) -     Pneumococcal polysaccharide vaccine 23-valent greater than or equal to 2yo subcutaneous/IM  Need for Tdap vaccination -     Tdap  vaccine greater than or equal to 7yo IM  Need for influenza vaccination -     Flu Vaccine QUAD 36+ mos PF IM (Fluarix & Fluzone Quad PF)  Acute cystitis without hematuria- she has symptoms and an abnormal UA, will start cipro -     ciprofloxacin (CIPRO) 250 MG tablet; Take 1 tablet (250 mg total) by mouth 2 (two) times daily.   I have discontinued Carrie Crawford sodium chloride and fluticasone. I am also having Carrie Crawford start on ciprofloxacin. Additionally, I am having Carrie Crawford maintain Carrie Crawford aspirin, CENTRUM SILVER, azelastine, losartan, hydrochlorothiazide, tiotropium, and Fluticasone Furoate-Vilanterol.  Meds ordered this encounter  Medications  . ciprofloxacin (CIPRO) 250 MG tablet    Sig: Take 1 tablet (250 mg total) by mouth 2 (two) times daily.    Dispense:  10 tablet    Refill:  1     Follow-up: Return in about 6 months (around 10/22/2015).  Scarlette Calico, MD

## 2015-04-23 NOTE — Progress Notes (Signed)
Subjective:   Carrie Crawford is a 81 y.o. female who presents for Medicare Annual (Subsequent) preventive examination.  Review of Systems:  HRA assessment completed during visit;  The Patient was informed that this wellness visit is to identify risk and educate on how to reduce risk for increase disease through lifestyle changes.   ROS deferred to CPE exam with physician  Medical issues include HTN; hyperlipidemia; managed by medication (LDL 91; HDL 55)  BMI: (ave 31 to 32) 34.7 today; the patient states she just sits and eats and has gained weight; the son [name]) states she seems to have become more inactive in the last few months.  MMSE 22/30; Dis-orientation to date, month; year; math changed to spelling WORLD backwards to account for 6th grade; completed 3; no recall but recalled 1 item with cuing; Son notes issues but could not discuss today with patient; Son continued to correct patient's statements regarding events; falls; driving; etc.   SAFETY/ lives with youngest son; [place] x 1 getting off the couch on am; Get up and go to the bathroom; trying to get out of bed to go to the bathroom; slide to the floor; x 2 about 2 months;  Gets up every night to go to bathroom;    Safety reviewed for the home; uses cane most of the time in home;  May be safer with walker but declines walker, as well as assistance when getting up and down/  Son is the only caregiver; Has time alone very infrequently;  Confirms clear paths through the home; bathroom safety; takes spitz baths;  [place] and [place] grocery stores and etc; The patient states she chooses not to drive; but son states it has been a long time since she drove.  Recliner at home; The patient gait is wide based and walks holding on to walls etc. Strength is good getting out of the chair.    Caregiver full time;  sister in 39; brother out of town; patient will not go stay with other children stating she  wants to stay at home/    Diet Cereals for breakfast; eats sandwich; picks up dinner and brings it home and son does most of the grocery;   Exercise; usually gets up and drinks coffee and goes back to bed; happy at home; Watches TV; son bought tape for sittercise and the patient will generally decline.   Discussed getting out to church but states she may start coughing Also discussed going to day care but states she would rather be at home  Medication review/ New meds  Fall assessment x 2 last month Gait assessment; gets up slow and dependent on rails or something to hold on too  Mobilization and Functional losses in the last year. Sleep patterns; sleeps fairly well most nights   Urinary or fecal incontinence reviewed/ gets up and goes to the bathroom Bladder did not empty and was getting up q 30 minutes; seems to be resolved now   Counseling: Dexa; No hx Colonoscopy; aged out EKG no hx Mammogram 06/2009 Hearing: hearing loss; declines hearing aids Ophthalmology exam; several years/ cataracts removed by Dr. Pandora Leiter   Immunizations Due  Tetanus - will take today Shingles; deferred  Flu; will take today PSV 23 will take today    Cardiac Risk Factors include: advanced age (>62mn, >>44women);dyslipidemia;hypertension;sedentary lifestyle;obesity (BMI >30kg/m2)     Objective:     Vitals: BP 134/64 mmHg  Pulse 91  Temp(Src)  98 F (36.7 C) (Oral)  Ht '5\' 3"'$  (1.6 m)  Wt 196 lb 8 oz (89.132 kg)  BMI 34.82 kg/m2  SpO2 93%  Tobacco History  Smoking status  . Never Smoker   Smokeless tobacco  . Not on file     Counseling given: Yes   Past Medical History  Diagnosis Date  . Hypertension   . Multiple allergies   . COPD (chronic obstructive pulmonary disease) ([facility])     chronic bronchitis  . Rhinitis, chronic   . Dyslipidemia   . Osteopenia    Past Surgical History  Procedure Laterality Date  . Cholecystectomy    . Thyroidectomy, partial  2004    [facility]    Family History  Problem Relation Age of Onset  . Heart disease Brother   . Cancer Daughter     endometial/uterine cancer(nodule in right lung)   History  Sexual Activity  . Sexual Activity: Not on file    Outpatient Encounter Prescriptions as of 04/23/2015  Medication Sig  . aspirin 81 MG tablet Take 81 mg by mouth daily.    Marland Kitchen azelastine (ASTELIN) 0.1 % nasal spray 1-2 sprays in each nostril at bedtime.  . Fluticasone Furoate-Vilanterol (BREO ELLIPTA) 100-25 MCG/INH AEPB Inhale 1 puff into the lungs daily.  . hydrochlorothiazide (HYDRODIURIL) 25 MG tablet TAKE 1/2 TABLET BY MOUTH ONCE DAILY  . losartan (COZAAR) 100 MG tablet Take 1 tablet (100 mg total) by mouth daily.  . Multiple Vitamins-Minerals (CENTRUM SILVER) tablet Take 1 tablet by mouth daily.    Marland Kitchen tiotropium (SPIRIVA HANDIHALER) 18 MCG inhalation capsule INHALE THE CONTENTS OF ONE CAPSULE ONCE DAILY  . [DISCONTINUED] sodium chloride (OCEAN) 0.65 % SOLN nasal spray Place 1 spray into both nostrils as needed for congestion.  . [DISCONTINUED] fluticasone (FLONASE) 50 MCG/ACT nasal spray 1-2 sprays in each nostril at bedtime (Patient not taking: Reported on 04/23/2015)   No facility-administered encounter medications on file as of 04/23/2015.    Activities of Daily Living In your present state of health, do you have any difficulty performing the following activities: 04/23/2015  Hearing? Y  Vision? N  Difficulty concentrating or making decisions? (No Data)  Walking or climbing stairs? Y  Dressing or bathing? Y  Doing errands, shopping? Y  Preparing Food and eating ? Y  Using the Toilet? Y  In the past six months, have you accidently leaked urine? N  Do you have problems with loss of bowel control? N  Managing your Medications? Y  Managing your Finances? Y  Housekeeping or managing your Housekeeping? Y    Patient Care Team: Janith Lima, MD as PCP - General (Internal Medicine) Deneise Lever, MD (Pulmonary  Disease)    Assessment:    Assessment   Patient presents for yearly preventative medicine examination. Medicare questionnaire screening were completed, i.e. Functional; fall risk; depression, memory loss and hearing.  Falling more of an issue; Holds on to walls but manages well in home with live in son. Discussed options for living if son should not be available; will determine alternate plan as needed; Declines socialization at this time; memory is declining; year 72; it is coming into fall; etc. Likes being at home and is "happy" at home.   All immunizations and health maintenance protocols were reviewed with the patient and needed orders were placed. Will determine imm with MD; given Tday; flu and PSV 23 today  Education provided for laboratory screens;  To labs post visit  Medication reconciliation, past  medical history, social history, problem list and allergies were reviewed in detail with the patient Discussed aging in place; also discussed alternative plan  Goals were established with regard to weight loss, exercise, and diet in compliance with medications based on the patient individualized risk;   End of life planning was discussed. Have completed HCPOA    Exercise Activities and Dietary recommendations Current Exercise Habits:: Home exercise routine, Time (Minutes): 10, Frequency (Times/Week): 2, Weekly Exercise (Minutes/Week): 20, Intensity: Mild  Goals    . patient     Will attempt sitter exercise; does a few every day       Fall Risk Fall Risk  04/23/2015  Falls in the past year? Yes  Number falls in past yr: 2 or more  Injury with Fall? No  Follow up Education provided   Depression Screen PHQ 2/9 Scores 04/23/2015  PHQ - 2 Score 0    Denies depression; crying spells etc.  Cognitive Testing MMSE - Mini Mental State Exam 04/23/2015  Orientation to time 3  Orientation to Place 5  Registration 3  Attention/ Calculation 3  Recall 0  Language- name 2  objects 2  Language- repeat 1  Language- follow 3 step command 3  Language- read & follow direction 1  Write a sentence 1  Copy design 0  Total score 22    Immunization History  Administered Date(s) Administered  . Influenza,inj,Quad PF,36+ Mos 04/26/2013, 04/19/2014, 04/23/2015  . Pneumococcal Conjugate-13 05/25/2014  . Pneumococcal Polysaccharide-23 03/14/1999, 04/23/2015  . Tdap 04/23/2015   Screening Tests Health Maintenance  Topic Date Due  . ZOSTAVAX  04/12/1988  . DEXA SCAN  04/12/1993  . INFLUENZA VACCINE  02/11/2016  . TETANUS/TDAP  04/22/2025  . PNA vac Low Risk Adult  Completed      Plan:   Plan  Review of screenings completed and plan for completion individualized: Education and counseling performed based on assessed risks today. Patient provided with a copy of personalized plan for preventive services.    Discussed exercise and sittercize tape with son  Son request call back to discuss resources in [facility] and other issues.     During the course of the visit the patient was educated and counseled about the following appropriate screening and preventive services:   Vaccines to include Pneumoccal, Influenza, Hepatitis B, Td, Zostavax, HCV  Electrocardiogram  Cardiovascular Disease  Colorectal cancer screening  Bone density screening  Diabetes screening  Glaucoma screening  Mammography/PAP  Nutrition counseling   Patient Instructions (the written plan) was given to the patient.   Wynetta Fines, RN  04/23/2015

## 2015-04-24 ENCOUNTER — Encounter: Payer: Self-pay | Admitting: Internal Medicine

## 2015-04-24 DIAGNOSIS — N3000 Acute cystitis without hematuria: Secondary | ICD-10-CM | POA: Insufficient documentation

## 2015-04-24 MED ORDER — CIPROFLOXACIN HCL 250 MG PO TABS
250.0000 mg | ORAL_TABLET | Freq: Two times a day (BID) | ORAL | Status: AC
Start: 2015-04-24 — End: 2015-04-29

## 2015-05-01 ENCOUNTER — Telehealth: Payer: Self-pay

## 2015-05-01 NOTE — Telephone Encounter (Signed)
Sent Bobby information on Dementia and explained that MMSE is not a DX but rather a glimpse as to a person's mental status on that day. Sent information on Alz  And 24 hour help line which may assist with difficulties as his mother's bathing issue and her refusal  to leave the home. Educated that information may be a helpful to resolve some behavioral changes that he is seeing,  but the patient may need to see MD for dx and further treatment if he continues to see memory issues that are impacting care. Asked to keep a log or journal or his concerns to discuss with the doctor at the next visit.

## 2015-06-03 ENCOUNTER — Encounter: Payer: Self-pay | Admitting: Internal Medicine

## 2015-06-03 ENCOUNTER — Ambulatory Visit (INDEPENDENT_AMBULATORY_CARE_PROVIDER_SITE_OTHER): Payer: Medicare Other | Admitting: Internal Medicine

## 2015-06-03 VITALS — BP 114/68 | HR 71 | Wt 196.6 lb

## 2015-06-03 DIAGNOSIS — J310 Chronic rhinitis: Secondary | ICD-10-CM

## 2015-06-03 DIAGNOSIS — J449 Chronic obstructive pulmonary disease, unspecified: Secondary | ICD-10-CM | POA: Diagnosis not present

## 2015-06-03 DIAGNOSIS — R4189 Other symptoms and signs involving cognitive functions and awareness: Secondary | ICD-10-CM

## 2015-06-03 MED ORDER — FLUTICASONE PROPIONATE 50 MCG/ACT NA SUSP: NASAL | Status: DC

## 2015-06-03 NOTE — Assessment & Plan Note (Signed)
At least mild dementia was evident on this exam. Son did most of the talking. Patient was repetitive about details of childhood history and comments to her son.

## 2015-06-03 NOTE — Assessment & Plan Note (Signed)
Son likes to have her alternate between azelastine and Flonase. Flonase is refilled

## 2015-06-03 NOTE — Progress Notes (Signed)
07/31/13- 76 yoF never smoker followed for chronic bronchitis, rhinitis, complicated by HBP FOLLOWS FOR: Pt last seen 2012. c/o:  sob in morning hours x1 month, also has cough with green/yellow mucus x1 week   Son here Dyspnea on exertion  but not very active. Cough with scant green sputum but no fever or sore throat. Son thinks her metered inhaler technique is ineffective. She denies wheeze or chest tightness. CXR 07/17/13 IMPRESSION:  Cardiomegaly.  Mildly tortuous calcified aorta.  No infiltrate, congestive heart failure or pneumothorax. .  Electronically Signed  By: Chauncey Cruel M.D.  On: 07/17/2013 17:25  09/11/13- 60 yoF never smoker followed for chronic bronchitis, rhinitis, complicated by HBP FOLLOWS FOR: Increased in cough since stopped taking spiriva. SOB with activity. Denies CP.  Son here Spiriva seemed to help for a while. Continues to be better. Stopped Symbicort. Son says her inhaler technique for this was poor. We discussed use of a spacer and training. CXR 07/17/13 IMPRESSION:  Cardiomegaly.  Mildly tortuous calcified aorta.  No infiltrate, congestive heart failure or pneumothorax. .  Electronically Signed  By: Chauncey Cruel M.D.  On: 07/17/2013 17:25  12/12/13- 14 yoF never smoker followed for chronic bronchitis, rhinitis, complicated by HBP Follows for: Pt states that her cough is unchanged since her last visit. She has noticed that cough is sometimes triggered by talking.  Cough is occ prod with minimal clear sputum.  Breathing is unchanged. No new co's today.  Son here. He is much more bothered by her cough than she is. Cough noted going in and out of doors. Denies reflux.Blames postnasal drip.Uses Nasonex some.  05/25/14- 88 yoF never smoker followed for chronic bronchitis, rhinitis, complicated by HBP   Son here  She has had flu vaccine FOLLOW FOR: Bronchitis; no complaints Chronic mild nonproductive cough, increased outdoors in the wind. Son thinks it is better. We  reviewed her medications and discussed Breo, Flonase, and pneumonia vaccine.  11/29/14- 88 yoF never smoker followed for chronic bronchitis, rhinitis, complicated by HBP   Son here Reports: doing fine since last visit Using Spiriva and Breo. Not much phlegm. Some increased SOB.  06/03/15- 81 year old female never smoker followed for chronic bronchitis, rhinitis, complicated by HBP FOLLOWS FOR: Pt states she has cough-productive at times-yellow. Denies any wheezing.   Son  here Has had flu vaccine. They deny any acute problems or exacerbations since last visit. She reminds me that she has had episodic bronchitis since early childhood. They're unsure if Breo Ellipta and Spiriva are doing anything. CXR 11/30/14 IMPRESSION: No active cardiopulmonary disease. Unchanged left basilar scarring. Electronically Signed  By: Logan Bores  On: 11/30/2014 09:04  ROS-see HPI Constitutional:   No-   weight loss, night sweats, fevers, chills, +fatigue, lassitude. HEENT:   No-  headaches, difficulty swallowing, tooth/dental problems, sore throat,       No-  sneezing, itching, ear ache, nasal congestion, post nasal drip,  CV:  No-   chest pain, orthopnea, PND, swelling in lower extremities, anasarca,                                                                    dizziness, palpitations Resp: +shortness of breath with exertion or at rest.  No-productive cough,  + non-productive cough,  No- coughing up of blood.              No-   change in color of mucus.  No- wheezing.   Skin: No-   rash or lesions. GI:  No-   heartburn, indigestion, abdominal pain, nausea, vomiting,  GU:  MS:  No-   joint pain or swelling.  . Neuro-     nothing unusual Psych:  No- change in mood or affect. No depression or anxiety.  No memory loss.  OBJ- Physical Exam General- Alert, Oriented, Affect-appropriate, Distress- none acute, +overweight Skin- rash-none, lesions- none, excoriation- none Lymphadenopathy-  none Head- atraumatic            Eyes- Gross vision intact, PERRLA, conjunctivae and secretions clear            Ears- Hearing, canals-normal            Nose- Clear, no-Septal dev, mucus, polyps, erosion, perforation             Throat- Mallampati III , mucosa clear , drainage- none, tonsils- atrophic Neck- flexible , trachea midline, no stridor , thyroid nl, carotid no bruit Chest - symmetrical excursion , unlabored           Heart/CV- RRR , no murmur , no gallop  , no rub, nl s1 s2                           - JVD- none , edema- none, stasis changes- none, varices- none           Lung- + a little coarse in the bases, wheeze- none, cough+ with deep breath , dullness-none, rub- none           Chest wall-  Abd- Br/ Gen/ Rectal- Not done, not indicated Extrem- cyanosis- none, clubbing, none, atrophy- none, strength- nl Neuro- grossly intact to observation

## 2015-06-03 NOTE — Assessment & Plan Note (Signed)
Persistent low-grade chronic bronchitis without apparent progression. Exercise tolerance seems to be appropriate for her age and body habitus. They can't tell if the inhalers are helpful. Plan-okay to try off Breo and Spiriva, restarting if needed

## 2015-06-03 NOTE — Patient Instructions (Signed)
Ok to set your Breo and Spiriva inhalers aside. Watch and restart if you need them

## 2015-07-29 ENCOUNTER — Telehealth: Payer: Self-pay | Admitting: Internal Medicine

## 2015-07-29 MED ORDER — FLUTICASONE FUROATE-VILANTEROL 100-25 MCG/INH IN AEPB: 1.0000 | INHALATION_SPRAY | Freq: Every day | RESPIRATORY_TRACT | Status: DC

## 2015-07-29 NOTE — Telephone Encounter (Signed)
Spoke with pt's husband. Pt needs a refill on Breo. This has been sent in. Nothing further was needed.

## 2015-09-23 ENCOUNTER — Telehealth: Payer: Self-pay | Admitting: Internal Medicine

## 2015-09-23 DIAGNOSIS — I10 Essential (primary) hypertension: Secondary | ICD-10-CM

## 2015-09-23 MED ORDER — HYDROCHLOROTHIAZIDE 25 MG PO TABS: ORAL_TABLET | ORAL | Status: DC

## 2015-09-23 NOTE — Telephone Encounter (Signed)
Pt requesting refill for hydrochlorothiazide (HYDRODIURIL) 25 MG tablet [[id number]]  Pharmacy is [facility] in [place]

## 2015-09-23 NOTE — Telephone Encounter (Signed)
Done

## 2015-10-22 ENCOUNTER — Ambulatory Visit (INDEPENDENT_AMBULATORY_CARE_PROVIDER_SITE_OTHER): Payer: Medicare Other | Admitting: Internal Medicine

## 2015-10-22 ENCOUNTER — Other Ambulatory Visit (INDEPENDENT_AMBULATORY_CARE_PROVIDER_SITE_OTHER): Payer: Medicare Other

## 2015-10-22 ENCOUNTER — Encounter: Payer: Self-pay | Admitting: Internal Medicine

## 2015-10-22 VITALS — BP 136/60 | HR 84 | Temp 99.0°F | Resp 16 | Ht 63.0 in | Wt 195.5 lb

## 2015-10-22 DIAGNOSIS — I10 Essential (primary) hypertension: Secondary | ICD-10-CM | POA: Diagnosis not present

## 2015-10-22 DIAGNOSIS — J449 Chronic obstructive pulmonary disease, unspecified: Secondary | ICD-10-CM | POA: Diagnosis not present

## 2015-10-22 DIAGNOSIS — Z0000 Encounter for general adult medical examination without abnormal findings: Secondary | ICD-10-CM | POA: Diagnosis not present

## 2015-10-22 LAB — CBC WITH DIFFERENTIAL/PLATELET
BASOS ABS: 0 10*3/uL (ref 0.0–0.1)
BASOS PCT: 0.5 % (ref 0.0–3.0)
EOS ABS: 0 10*3/uL (ref 0.0–0.7)
Eosinophils Relative: 0.2 % (ref 0.0–5.0)
HEMATOCRIT: 39.8 % (ref 36.0–46.0)
Hemoglobin: 13.6 g/dL (ref 12.0–15.0)
Lymphocytes Relative: 19.6 % (ref 12.0–46.0)
Lymphs Abs: 1.2 10*3/uL (ref 0.7–4.0)
MCHC: 34.1 g/dL (ref 30.0–36.0)
MCV: 91.7 fl (ref 78.0–100.0)
Monocytes Absolute: 0.7 10*3/uL (ref 0.1–1.0)
Monocytes Relative: 10.5 % (ref 3.0–12.0)
Neutro Abs: 4.3 10*3/uL (ref 1.4–7.7)
Neutrophils Relative %: 69.2 % (ref 43.0–77.0)
Platelets: 281 10*3/uL (ref 150.0–400.0)
RBC: 4.34 Mil/uL (ref 3.87–5.11)
RDW: 13.9 % (ref 11.5–15.5)
WBC: 6.3 10*3/uL (ref 4.0–10.5)

## 2015-10-22 LAB — BASIC METABOLIC PANEL
BUN: 12 mg/dL (ref 6–23)
CHLORIDE: 92 meq/L — AB (ref 96–112)
CO2: 32 meq/L (ref 19–32)
Calcium: 9.4 mg/dL (ref 8.4–10.5)
Creatinine, Ser: 0.81 mg/dL (ref 0.40–1.20)
GFR: 71 mL/min (ref >60.00)
Glucose, Bld: 111 mg/dL — ABNORMAL HIGH (ref 70–99)
POTASSIUM: 4.2 meq/L (ref 3.5–5.1)
Sodium: 128 mEq/L — ABNORMAL LOW (ref 135–145)

## 2015-10-22 NOTE — Progress Notes (Signed)
Subjective:  Patient ID: Carrie Crawford, female    DOB: [birth year]  Age: 82 y.o. MRN: [id number]  CC: Hypertension; Asthma; and Annual Exam   HPI BRENLYN BESHARA presents for a CPX -  She tells me her blood pressure has been well controlled on the combination of hydrochlorothiazide and losartan. She denies headache, blurred vision, chest pain, shortness of breath, edema, palpitations, or dyspnea on exertion.  She's had no recent flareups of COPD and uses Breo and Spiriva inhalers consistently. She rarely notices wheezing. She denies any recent cough or hemoptysis.  Outpatient Prescriptions Prior to Visit  Medication Sig Dispense Refill  . aspirin 81 MG tablet Take 81 mg by mouth daily.      Marland Kitchen azelastine (ASTELIN) 0.1 % nasal spray 1-2 sprays in each nostril at bedtime. 30 mL 6  . fluticasone (FLONASE) 50 MCG/ACT nasal spray 1-2 puffs each nostril, once or twice daily 16 g 11  . Fluticasone Furoate-Vilanterol (BREO ELLIPTA) 100-25 MCG/INH AEPB Inhale 1 puff into the lungs daily. 60 each 5  . hydrochlorothiazide (HYDRODIURIL) 25 MG tablet TAKE 1/2 TABLET BY MOUTH ONCE DAILY 45 tablet 1  . losartan (COZAAR) 100 MG tablet Take 1 tablet (100 mg total) by mouth daily. 90 tablet 3  . Multiple Vitamins-Minerals (CENTRUM SILVER) tablet Take 1 tablet by mouth daily.      Marland Kitchen tiotropium (SPIRIVA HANDIHALER) 18 MCG inhalation capsule INHALE THE CONTENTS OF ONE CAPSULE ONCE DAILY 30 capsule 5   No facility-administered medications prior to visit.    ROS Review of Systems  Constitutional: Negative.  Negative for fever, chills, diaphoresis, appetite change and fatigue.  HENT: Negative.   Eyes: Negative.   Respiratory: Positive for wheezing. Negative for cough, choking, chest tightness, shortness of breath and stridor.   Cardiovascular: Negative.  Negative for chest pain, palpitations and leg swelling.  Gastrointestinal: Negative.  Negative for nausea, vomiting, abdominal pain, diarrhea and  constipation.  Endocrine: Negative.   Genitourinary: Negative.  Negative for difficulty urinating.  Musculoskeletal: Negative.   Skin: Negative.   Allergic/Immunologic: Negative.   Neurological: Negative.  Negative for dizziness, tremors, syncope, light-headedness and numbness.  Hematological: Negative.  Negative for adenopathy. Does not bruise/bleed easily.  Psychiatric/Behavioral: Negative.     Objective:  BP 136/60 mmHg  Pulse 84  Temp(Src) 99 F (37.2 C) (Oral)  Resp 16  Ht '5\' 3"'$  (1.6 m)  Wt 195 lb 8 oz (88.678 kg)  BMI 34.64 kg/m2  SpO2 94%  BP Readings from Last 3 Encounters:  10/22/15 136/60  06/03/15 114/68  04/23/15 134/64    Wt Readings from Last 3 Encounters:  10/22/15 195 lb 8 oz (88.678 kg)  06/03/15 196 lb 9.6 oz (89.177 kg)  04/23/15 196 lb 8 oz (89.132 kg)    Physical Exam  Constitutional: She is oriented to person, place, and time. She appears well-developed and well-nourished. No distress.  HENT:  Mouth/Throat: Oropharynx is clear and moist. No oropharyngeal exudate.  Eyes: Conjunctivae are normal. Right eye exhibits no discharge. Left eye exhibits no discharge. No scleral icterus.  Neck: Normal range of motion. Neck supple. No JVD present. No tracheal deviation present. No thyromegaly present.  Cardiovascular: Normal rate, regular rhythm, normal heart sounds and intact distal pulses.  Exam reveals no gallop and no friction rub.   No murmur heard. Pulmonary/Chest: Effort normal and breath sounds normal. No stridor. No respiratory distress. She has no wheezes. She has no rales. She exhibits no tenderness.  Abdominal: Soft. Bowel  sounds are normal. She exhibits no distension and no mass. There is no tenderness. There is no rebound and no guarding.  Musculoskeletal: Normal range of motion. She exhibits no edema.  Lymphadenopathy:    She has no cervical adenopathy.  Neurological: She is oriented to person, place, and time.  Skin: Skin is warm and dry. No  rash noted. She is not diaphoretic. No erythema. No pallor.  Vitals reviewed.   Lab Results  Component Value Date   WBC 6.3 10/22/2015   HGB 13.6 10/22/2015   HCT 39.8 10/22/2015   PLT 281.0 10/22/2015   GLUCOSE 111* 10/22/2015   CHOL 172 04/23/2015   TRIG 131.0 04/23/2015   HDL 56.30 04/23/2015   LDLCALC 89 04/23/2015   ALT 14 10/23/2014   AST 21 10/23/2014   NA 128* 10/22/2015   K 4.2 10/22/2015   CL 92* 10/22/2015   CREATININE 0.81 10/22/2015   BUN 12 10/22/2015   CO2 32 10/22/2015   TSH 2.07 04/23/2015    Dg Chest 2 View  11/30/2014  CLINICAL DATA:  Chronic bronchitis.  Chronic cough.  COPD. EXAM: CHEST  2 VIEW COMPARISON:  09/07/2012 and 06/01/2007 FINDINGS: Cardiac silhouette remains mildly enlarged, unchanged. Thoracic aortic calcification is again noted. Small nodular density in the left lung base and left basilar scarring are unchanged. There is no evidence of acute airspace consolidation, edema, pleural effusion, or pneumothorax. No acute osseous abnormality is seen. IMPRESSION: No active cardiopulmonary disease.  Unchanged left basilar scarring. Electronically Signed   By: Logan Bores   On: 11/30/2014 09:04    Assessment & Plan:   Greenlee was seen today for hypertension, asthma and annual exam.  Diagnoses and all orders for this visit:  Essential hypertension- her blood pressure is well-controlled, electrolytes and renal function are stable. -     CBC with Differential/Platelet; Future -     Basic metabolic panel; Future  Routine general medical examination at a health care facility  Obstructive chronic bronchitis without exacerbation ([place])- she is doing well with no recent flare of symptoms or other complications, will continue the current inhaler combination.   I am having Ms. Storrs maintain her aspirin, CENTRUM SILVER, azelastine, losartan, tiotropium, fluticasone, fluticasone furoate-vilanterol, and hydrochlorothiazide.  No orders of the defined types  were placed in this encounter.    See AVS for instructions about healthy living and anticipatory guidance.  Follow-up: Return in about 6 months (around 04/22/2016).  Scarlette Calico, MD

## 2015-10-22 NOTE — Patient Instructions (Signed)
Hypertension Hypertension, commonly called high blood pressure, is when the force of blood pumping through your arteries is too strong. Your arteries are the blood vessels that carry blood from your heart throughout your body. A blood pressure reading consists of a higher number over a lower number, such as 110/72. The higher number (systolic) is the pressure inside your arteries when your heart pumps. The lower number (diastolic) is the pressure inside your arteries when your heart relaxes. Ideally you want your blood pressure below 120/80. Hypertension forces your heart to work harder to pump blood. Your arteries may become narrow or stiff. Having untreated or uncontrolled hypertension can cause heart attack, stroke, kidney disease, and other problems. RISK FACTORS Some risk factors for high blood pressure are controllable. Others are not.  Risk factors you cannot control include:   Race. You may be at higher risk if you are African American.  Age. Risk increases with age.  Gender. Men are at higher risk than women before age 45 years. After age 65, women are at higher risk than men. Risk factors you can control include:  Not getting enough exercise or physical activity.  Being overweight.  Getting too much fat, sugar, calories, or salt in your diet.  Drinking too much alcohol. SIGNS AND SYMPTOMS Hypertension does not usually cause signs or symptoms. Extremely high blood pressure (hypertensive crisis) may cause headache, anxiety, shortness of breath, and nosebleed. DIAGNOSIS To check if you have hypertension, your health care provider will measure your blood pressure while you are seated, with your arm held at the level of your heart. It should be measured at least twice using the same arm. Certain conditions can cause a difference in blood pressure between your right and left arms. A blood pressure reading that is higher than normal on one occasion does not mean that you need treatment. If  it is not clear whether you have high blood pressure, you may be asked to return on a different day to have your blood pressure checked again. Or, you may be asked to monitor your blood pressure at home for 1 or more weeks. TREATMENT Treating high blood pressure includes making lifestyle changes and possibly taking medicine. Living a healthy lifestyle can help lower high blood pressure. You may need to change some of your habits. Lifestyle changes may include:  Following the DASH diet. This diet is high in fruits, vegetables, and whole grains. It is low in salt, red meat, and added sugars.  Keep your sodium intake below 2,300 mg per day.  Getting at least 30-45 minutes of aerobic exercise at least 4 times per week.  Losing weight if necessary.  Not smoking.  Limiting alcoholic beverages.  Learning ways to reduce stress. Your health care provider may prescribe medicine if lifestyle changes are not enough to get your blood pressure under control, and if one of the following is true:  You are 18-59 years of age and your systolic blood pressure is above 140.  You are 60 years of age or older, and your systolic blood pressure is above 150.  Your diastolic blood pressure is above 90.  You have diabetes, and your systolic blood pressure is over 140 or your diastolic blood pressure is over 90.  You have kidney disease and your blood pressure is above 140/90.  You have heart disease and your blood pressure is above 140/90. Your personal target blood pressure may vary depending on your medical conditions, your age, and other factors. HOME CARE INSTRUCTIONS    Have your blood pressure rechecked as directed by your health care provider.   Take medicines only as directed by your health care provider. Follow the directions carefully. Blood pressure medicines must be taken as prescribed. The medicine does not work as well when you skip doses. Skipping doses also puts you at risk for  problems.  Do not smoke.   Monitor your blood pressure at home as directed by your health care provider. SEEK MEDICAL CARE IF:   You think you are having a reaction to medicines taken.  You have recurrent headaches or feel dizzy.  You have swelling in your ankles.  You have trouble with your vision. SEEK IMMEDIATE MEDICAL CARE IF:  You develop a severe headache or confusion.  You have unusual weakness, numbness, or feel faint.  You have severe chest or abdominal pain.  You vomit repeatedly.  You have trouble breathing. MAKE SURE YOU:   Understand these instructions.  Will watch your condition.  Will get help right away if you are not doing well or get worse.   This information is not intended to replace advice given to you by your health care provider. Make sure you discuss any questions you have with your health care provider.   Document Released: 06/29/2005 Document Revised: 11/13/2014 Document Reviewed: 04/21/2013 Elsevier Interactive Patient Education 2016 [employer]  

## 2015-10-22 NOTE — Progress Notes (Signed)
Pre visit review using our clinic review tool, if applicable. No additional management support is needed unless otherwise documented below in the visit note. 

## 2015-10-23 NOTE — Assessment & Plan Note (Signed)

## 2015-10-24 ENCOUNTER — Telehealth: Payer: Self-pay | Admitting: Internal Medicine

## 2015-10-24 MED ORDER — TIOTROPIUM BROMIDE MONOHYDRATE 18 MCG IN CAPS: ORAL_CAPSULE | RESPIRATORY_TRACT | Status: DC

## 2015-10-24 NOTE — Telephone Encounter (Signed)
Called and spoke with pt's son. He states that the pt needs a refill on her spiriva. I verified pharmacy as [facility] in [place]. He voiced understanding and had no further questions. Rx sent. Nothing further needed.

## 2015-12-02 ENCOUNTER — Ambulatory Visit (INDEPENDENT_AMBULATORY_CARE_PROVIDER_SITE_OTHER): Payer: Medicare Other | Admitting: Internal Medicine

## 2015-12-02 ENCOUNTER — Encounter: Payer: Self-pay | Admitting: Internal Medicine

## 2015-12-02 VITALS — BP 128/76 | HR 88 | Ht 65.0 in | Wt 196.6 lb

## 2015-12-02 DIAGNOSIS — J449 Chronic obstructive pulmonary disease, unspecified: Secondary | ICD-10-CM

## 2015-12-02 DIAGNOSIS — J310 Chronic rhinitis: Secondary | ICD-10-CM

## 2015-12-02 DIAGNOSIS — J4489 Other specified chronic obstructive pulmonary disease: Secondary | ICD-10-CM

## 2015-12-02 NOTE — Patient Instructions (Signed)
I think Dr Ronnald Ramp will be comfortable managing your breathing medications. You have been stable.  We will be happy to see you again for pulmonary problems if he needs our help.

## 2015-12-02 NOTE — Progress Notes (Signed)
07/31/13- 40 yoF never smoker followed for chronic bronchitis, rhinitis, complicated by HBP FOLLOWS FOR: Pt last seen 2012. c/o:  sob in morning hours x1 month, also has cough with green/yellow mucus x1 week   Son here Dyspnea on exertion  but not very active. Cough with scant green sputum but no fever or sore throat. Son thinks her metered inhaler technique is ineffective. She denies wheeze or chest tightness. CXR 07/17/13 IMPRESSION:  Cardiomegaly.  Mildly tortuous calcified aorta.  No infiltrate, congestive heart failure or pneumothorax. .  Electronically Signed  By: Chauncey Cruel M.D.  On: 07/17/2013 17:25  09/11/13- 16 yoF never smoker followed for chronic bronchitis, rhinitis, complicated by HBP FOLLOWS FOR: Increased in cough since stopped taking spiriva. SOB with activity. Denies CP.  Son here Spiriva seemed to help for a while. Continues to be better. Stopped Symbicort. Son says her inhaler technique for this was poor. We discussed use of a spacer and training. CXR 07/17/13 IMPRESSION:  Cardiomegaly.  Mildly tortuous calcified aorta.  No infiltrate, congestive heart failure or pneumothorax. .  Electronically Signed  By: Chauncey Cruel M.D.  On: 07/17/2013 17:25  12/12/13- 40 yoF never smoker followed for chronic bronchitis, rhinitis, complicated by HBP Follows for: Pt states that her cough is unchanged since her last visit. She has noticed that cough is sometimes triggered by talking.  Cough is occ prod with minimal clear sputum.  Breathing is unchanged. No new co's today.  Son here. He is much more bothered by her cough than she is. Cough noted going in and out of doors. Denies reflux.Blames postnasal drip.Uses Nasonex some.  05/25/14- 88 yoF never smoker followed for chronic bronchitis, rhinitis, complicated by HBP   Son here  She has had flu vaccine FOLLOW FOR: Bronchitis; no complaints Chronic mild nonproductive cough, increased outdoors in the wind. Son thinks it is better. We  reviewed her medications and discussed Breo, Flonase, and pneumonia vaccine.  11/29/14- 88 yoF never smoker followed for chronic bronchitis, rhinitis, complicated by HBP   Son here Reports: doing fine since last visit Using Spiriva and Breo. Not much phlegm. Some increased SOB.  06/03/15- 82 year old female never smoker followed for chronic bronchitis, rhinitis, complicated by HBP FOLLOWS FOR: Pt states she has cough-productive at times-yellow. Denies any wheezing.   Son  here Has had flu vaccine. They deny any acute problems or exacerbations since last visit. She reminds me that she has had episodic bronchitis since early childhood. They're unsure if Breo Ellipta and Spiriva are doing anything. CXR 11/30/14 IMPRESSION: No active cardiopulmonary disease. Unchanged left basilar scarring. Electronically Signed  By: Logan Bores  On: 11/30/2014 09:04  12/02/2015-81 year old female never smoker followed for chronic bronchitis, rhinitis, complicated by HBP FOLLOWS FOR: Pt states she is doing well overall; gives out little quicker when up exerting herself.     ROS-see HPI Constitutional:   No-   weight loss, night sweats, fevers, chills, +fatigue, lassitude. HEENT:   No-  headaches, difficulty swallowing, tooth/dental problems, sore throat,       No-  sneezing, itching, ear ache, nasal congestion, post nasal drip,  CV:  No-   chest pain, orthopnea, PND, swelling in lower extremities, anasarca,  dizziness, palpitations Resp: +shortness of breath with exertion or at rest.              No-productive cough,  + non-productive cough,  No- coughing up of blood.              No-   change in color of mucus.  No- wheezing.   Skin: No-   rash or lesions. GI:  No-   heartburn, indigestion, abdominal pain, nausea, vomiting,  GU:  MS:  No-   joint pain or swelling.  . Neuro-     nothing unusual Psych:  No- change in mood or affect. No  depression or anxiety.  No memory loss.  OBJ- Physical Exam General- Alert, Oriented, Affect-appropriate, Distress- none acute, +overweight Skin- rash-none, lesions- none, excoriation- none Lymphadenopathy- none Head- atraumatic            Eyes- Gross vision intact, PERRLA, conjunctivae and secretions clear            Ears- Hearing, canals-normal            Nose- Clear, no-Septal dev, mucus, polyps, erosion, perforation             Throat- Mallampati III , mucosa clear , drainage- none, tonsils- atrophic Neck- flexible , trachea midline, no stridor , thyroid nl, carotid no bruit Chest - symmetrical excursion , unlabored           Heart/CV- RRR , no murmur , no gallop  , no rub, nl s1 s2                           - JVD- none , edema- none, stasis changes- none, varices- none           Lung- + a little coarse in the bases, wheeze- none, cough+ with deep breath , dullness-none, rub- none           Chest wall-  Abd- Br/ Gen/ Rectal- Not done, not indicated Extrem- cyanosis- none, clubbing, none, atrophy- none, strength- nl Neuro- grossly intact to observation

## 2015-12-12 ENCOUNTER — Telehealth: Payer: Self-pay | Admitting: Internal Medicine

## 2015-12-12 NOTE — Telephone Encounter (Signed)
Pt son calling states that the patient is having increased cough - started 12/11/15 with increased congestion.  Son is concerned with her cough - denies her having any SOB that he is aware of.  Feels that she needs to be seen by someone and wasn't sure if she needed to go to PCP (as per last OV pt was released to her PCP to f/u for med refills).  Please advise Dr Annamaria Boots. Thanks.   No Known Allergies   Medication List       This list is accurate as of: 12/12/15 12:43 PM.  Always use your most recent med list.               aspirin 81 MG tablet  Take 81 mg by mouth daily.     azelastine 0.1 % nasal spray  Commonly known as:  ASTELIN  1-2 sprays in each nostril at bedtime.     CENTRUM SILVER tablet  Take 1 tablet by mouth daily.     fluticasone 50 MCG/ACT nasal spray  Commonly known as:  FLONASE  1-2 puffs each nostril, once or twice daily     fluticasone furoate-vilanterol 100-25 MCG/INH Aepb  Commonly known as:  BREO ELLIPTA  Inhale 1 puff into the lungs daily.     hydrochlorothiazide 25 MG tablet  Commonly known as:  HYDRODIURIL  TAKE 1/2 TABLET BY MOUTH ONCE DAILY     losartan 100 MG tablet  Commonly known as:  COZAAR  Take 1 tablet (100 mg total) by mouth daily.     tiotropium 18 MCG inhalation capsule  Commonly known as:  SPIRIVA HANDIHALER  INHALE THE CONTENTS OF ONE CAPSULE ONCE DAILY

## 2015-12-12 NOTE — Telephone Encounter (Signed)
Spoke with pt's son. Advised him of the below. Nothing further was needed.

## 2015-12-12 NOTE — Telephone Encounter (Signed)
Suggest she start with her PCP.

## 2015-12-26 ENCOUNTER — Ambulatory Visit (INDEPENDENT_AMBULATORY_CARE_PROVIDER_SITE_OTHER): Payer: Medicare Other | Admitting: Family

## 2015-12-26 ENCOUNTER — Ambulatory Visit (INDEPENDENT_AMBULATORY_CARE_PROVIDER_SITE_OTHER)
Admission: RE | Admit: 2015-12-26 | Discharge: 2015-12-26 | Disposition: A | Discharge status: Home / Self Care | Payer: Medicare Other | Source: Ambulatory Visit | Attending: Family | Admitting: Family

## 2015-12-26 ENCOUNTER — Encounter: Payer: Self-pay | Admitting: Family

## 2015-12-26 VITALS — BP 138/70 | HR 90 | Temp 98.1°F | Ht 65.0 in | Wt 192.0 lb

## 2015-12-26 DIAGNOSIS — J449 Chronic obstructive pulmonary disease, unspecified: Secondary | ICD-10-CM

## 2015-12-26 MED ORDER — IPRATROPIUM-ALBUTEROL 0.5-2.5 (3) MG/3ML IN SOLN
3.0000 mL | Freq: Once | RESPIRATORY_TRACT | Status: AC
  Administered 2015-12-26: 3 mL via RESPIRATORY_TRACT

## 2015-12-26 MED ORDER — GUAIFENESIN ER 600 MG PO TB12: 1200.0000 mg | ORAL_TABLET | Freq: Two times a day (BID) | ORAL | Status: DC | PRN

## 2015-12-26 MED ORDER — PROMETHAZINE-DM 6.25-15 MG/5ML PO SYRP: 5.0000 mL | ORAL_SOLUTION | Freq: Every evening | ORAL | Status: DC | PRN

## 2015-12-26 MED ORDER — BENZONATATE 100 MG PO CAPS: 100.0000 mg | ORAL_CAPSULE | Freq: Three times a day (TID) | ORAL | Status: DC | PRN

## 2015-12-26 MED ORDER — AZITHROMYCIN 250 MG PO TABS: ORAL_TABLET | ORAL | Status: DC

## 2015-12-26 MED ORDER — AZELASTINE HCL 0.1 % NA SOLN: NASAL | Status: DC

## 2015-12-26 NOTE — Patient Instructions (Signed)
You may stop the over-the-counter cough medication  Mucinex which I have prescribed for you.  I have prescribed TWO cough medications for you. Please pick one of the other, do not take both; Tessalon is less drowsy.  Please take cough medication at night only as needed. As we discussed, I do not recommend dosing throughout the day as coughing is a protective mechanism . It also helps to break up thick mucous.  Do not take cough suppressants with alcohol as can lead to trouble breathing. Advise caution if taking cough suppressant and operating machinery ( i.e driving a car) as you may feel very tired.   Increase intake of clear fluids. Congestion is best treated by hydration, when mucus is wetter, it is thinner, less sticky, and easier to expel from the body, either through coughing up drainage, or by blowing your nose.   Get plenty of rest.   Use saline nasal drops and blow your nose frequently. Run a humidifier at night and elevate the head of the bed. Vicks Vapor rub will help with congestion and cough. Steam showers and sinus massage for congestion.   Use Acetaminophen or Ibuprofen as needed for fever or pain. Avoid second hand smoke. Even the smallest exposure will worsen symptoms.   Over the counter medications you can try include Delsym for cough, a decongestant for congestion, and Mucinex or Robitussin as an expectorant. Be sure to just get the plain Mucinex or Robitussin that just has one medication (Guaifenesen). We don't recommend the combination products. Note, be sure to drink two glasses of water with each dose of Mucinex as the medication will not work well without adequate hydration.   You can also try a teaspoon of honey to see if this will help reduce cough. Throat lozenges can sometimes be beneficial as well.    This illness will typically last 7 - 10 days.   Please follow up with our clinic if you develop a fever greater than 101 F, symptoms worsen, or do not resolve in the  next week.

## 2015-12-26 NOTE — Progress Notes (Signed)
Subjective:    Patient ID: Carrie Crawford, female    DOB: 20-Jul-1925, 82 y.o.   MRN: [id number]   Carrie Crawford is a 82 y.o. female who presents today for an acute visit.    HPI Comments: Patient here for evaluation of productive cough for couple of weeks. Waxing and waning. Endorses nasal congestion. Intermittent, occcasional SOB. History of obstructive chronic bronchitis. Has tried Tussin OTC with some relief. Using Brio and Spiriva with relief.  No fever, chills. Denies exertional chest pain or pressure, numbness or tingling radiating to left arm or jaw, palpitations, dizziness, frequent headaches, changes in vision.                                                                                                                                                                              Past Medical History  Diagnosis Date  . Hypertension   . Multiple allergies   . COPD (chronic obstructive pulmonary disease) (HCC)     chronic bronchitis  . Rhinitis, chronic   . Dyslipidemia   . Osteopenia    Allergies: Review of patient's allergies indicates no known allergies. Current Outpatient Prescriptions on File Prior to Visit  Medication Sig Dispense Refill  . aspirin 81 MG tablet Take 81 mg by mouth daily.      . fluticasone (FLONASE) 50 MCG/ACT nasal spray 1-2 puffs each nostril, once or twice daily 16 g 11  . Fluticasone Furoate-Vilanterol (BREO ELLIPTA) 100-25 MCG/INH AEPB Inhale 1 puff into the lungs daily. 60 each 5  . hydrochlorothiazide (HYDRODIURIL) 25 MG tablet TAKE 1/2 TABLET BY MOUTH ONCE DAILY 45 tablet 1  . losartan (COZAAR) 100 MG tablet Take 1 tablet (100 mg total) by mouth daily. 90 tablet 3  . Multiple Vitamins-Minerals (CENTRUM SILVER) tablet Take 1 tablet by mouth daily.      Marland Kitchen tiotropium (SPIRIVA  HANDIHALER) 18 MCG inhalation capsule INHALE THE CONTENTS OF ONE CAPSULE ONCE DAILY 30 capsule 5   No current facility-administered medications on file prior to visit.    Social History  Substance Use Topics  . Smoking status: Never Smoker   . Smokeless tobacco: None  . Alcohol Use: No    Review of Systems  Constitutional: Negative for fever and chills.  HENT: Positive for congestion. Negative for ear pain, sinus pressure and sore throat.   Respiratory: Positive for cough and shortness of breath. Negative for wheezing.   Cardiovascular: Negative for chest pain and palpitations.  Gastrointestinal: Negative for nausea and vomiting.  Neurological: Negative for headaches.      Objective:    BP 138/70 mmHg  Pulse 90  Temp(Src) 98.1 F (36.7 C) (Oral)  Ht '5\' 5"'$  (1.651  m)  Wt 192 lb (87.091 kg)  BMI 31.95 kg/m2  SpO2 93%   Physical Exam  Constitutional: She appears well-developed and well-nourished.  HENT:  Head: Normocephalic and atraumatic.  Right Ear: Hearing, tympanic membrane, external ear and ear canal normal. No drainage, swelling or tenderness. No foreign bodies. Tympanic membrane is not erythematous and not bulging. No middle ear effusion. No decreased hearing is noted.  Left Ear: Hearing, tympanic membrane, external ear and ear canal normal. No drainage, swelling or tenderness. No foreign bodies. Tympanic membrane is not erythematous and not bulging.  No middle ear effusion. No decreased hearing is noted.  Nose: Nose normal. No rhinorrhea. Right sinus exhibits no maxillary sinus tenderness and no frontal sinus tenderness. Left sinus exhibits no maxillary sinus tenderness and no frontal sinus tenderness.  Mouth/Throat: Uvula is midline, oropharynx is clear and moist and mucous membranes are normal. No oropharyngeal exudate, posterior oropharyngeal edema, posterior oropharyngeal erythema or tonsillar abscesses.  Eyes: Conjunctivae are normal.  Cardiovascular: Regular  rhythm, normal heart sounds and normal pulses.   No LE edema.  Pulmonary/Chest: Effort normal. She has decreased breath sounds in the right lower field and the left lower field. She has no wheezes. She has no rhonchi. She has rales in the right lower field and the left lower field.  Lymphadenopathy:       Head (right side): No submental, no submandibular, no tonsillar, no preauricular, no posterior auricular and no occipital adenopathy present.       Head (left side): No submental, no submandibular, no tonsillar, no preauricular, no posterior auricular and no occipital adenopathy present.    She has no cervical adenopathy.  Neurological: She is alert.  Skin: Skin is warm and dry.  Psychiatric: She has a normal mood and affect. Her speech is normal and behavior is normal. Thought content normal.  Vitals reviewed. Patient felt significantly better after albuterol treatment. Lung sounds increased; audible crackles now.      Assessment & Plan:  1. Obstructive chronic bronchitis without exacerbation (HCC) Afebrile. No acute respiratory distress. Pending CXR to evaluate for PNA. Due to h/o obstructive disease and age, patient, son, and, I agreed to start antibiotic.  - benzonatate (TESSALON) 100 MG capsule; Take 1 capsule (100 mg total) by mouth 3 (three) times daily as needed for cough.  Dispense: 20 capsule; Refill: 1 - promethazine-dextromethorphan (PROMETHAZINE-DM) 6.25-15 MG/5ML syrup; Take 5 mLs by mouth at bedtime as needed for cough.  Dispense: 40 mL; Refill: 1 - guaiFENesin (MUCINEX) 600 MG 12 hr tablet; Take 2 tablets (1,200 mg total) by mouth 2 (two) times daily as needed for cough or to loosen phlegm.  Dispense: 60 tablet; Refill: 1 - ipratropium-albuterol (DUONEB) 0.5-2.5 (3) MG/3ML nebulizer solution 3 mL; Take 3 mLs by nebulization once. -azithromycin -CXR   I am having Carrie Crawford start on benzonatate, promethazine-dextromethorphan, guaiFENesin, and azithromycin. I am also having  her maintain her aspirin, CENTRUM SILVER, losartan, fluticasone, fluticasone furoate-vilanterol, hydrochlorothiazide, tiotropium, and azelastine. We administered ipratropium-albuterol.   Meds ordered this encounter  Medications  . azelastine (ASTELIN) 0.1 % nasal spray    Sig: 1-2 sprays in each nostril at bedtime.    Dispense:  90 mL    Refill:  0    90 day supply  . benzonatate (TESSALON) 100 MG capsule    Sig: Take 1 capsule (100 mg total) by mouth 3 (three) times daily as needed for cough.    Dispense:  20 capsule    Refill:  1    Order Specific Question:  Supervising Provider    Answer:  Cassandria Anger [[id number]]  . promethazine-dextromethorphan (PROMETHAZINE-DM) 6.25-15 MG/5ML syrup    Sig: Take 5 mLs by mouth at bedtime as needed for cough.    Dispense:  40 mL    Refill:  1    Order Specific Question:  Supervising Provider    Answer:  Cassandria Anger [[id number]]  . guaiFENesin (MUCINEX) 600 MG 12 hr tablet    Sig: Take 2 tablets (1,200 mg total) by mouth 2 (two) times daily as needed for cough or to loosen phlegm.    Dispense:  60 tablet    Refill:  1    Order Specific Question:  Supervising Provider    Answer:  Cassandria Anger [[id number]]  . ipratropium-albuterol (DUONEB) 0.5-2.5 (3) MG/3ML nebulizer solution 3 mL    Sig:   . azithromycin (ZITHROMAX) 250 MG tablet    Sig: Tale 500 mg PO on day 1, then 250 mg PO q24h x 4 days.    Dispense:  6 tablet    Refill:  0    Order Specific Question:  Supervising Provider    Answer:  Cassandria Anger [[id number]]     Start medications as prescribed and explained to patient on After Visit Summary ( AVS). Risks, benefits, and alternatives of the medications and treatment plan prescribed today were discussed, and patient expressed understanding.   Education regarding symptom management and diagnosis given to patient.   Follow-up:Plan follow-up and return precautions given if any worsening symptoms or change in condition.    Continue to follow with Scarlette Calico, MD for routine health maintenance.   Carrie Crawford and I agreed with plan.   Mable Paris, FNP

## 2015-12-26 NOTE — Progress Notes (Signed)
Pre visit review using our clinic review tool, if applicable. No additional management support is needed unless otherwise documented below in the visit note. 

## 2015-12-31 ENCOUNTER — Other Ambulatory Visit: Payer: Self-pay | Admitting: Family

## 2015-12-31 ENCOUNTER — Telehealth: Payer: Self-pay

## 2015-12-31 DIAGNOSIS — J181 Lobar pneumonia, unspecified organism: Principal | ICD-10-CM

## 2015-12-31 DIAGNOSIS — J189 Pneumonia, unspecified organism: Secondary | ICD-10-CM

## 2015-12-31 NOTE — Telephone Encounter (Signed)
-----   Message from Burnard Hawthorne, [place] sent at 12/31/2015  9:24 AM EDT ----- CXR order placed for patient. Please let them know to come back in 6 weeks.  ----- Message -----    From: Aviva Signs, CMA    Sent: 12/30/2015   3:23 PM      To: Burnard Hawthorne, FNP  Pt and son informed of results and abx will be completed tomorrow. Pt son wanted to know if there is to be another xray in about 6 weeks to confirm that the pneumonia has cleared?

## 2015-12-31 NOTE — Telephone Encounter (Signed)
Added the below note to pt chart as telephone encounter.   Sending a mychart message to son informing of the order for a return xray in 6 weeks.   Forwarding to provider as an [place]

## 2016-02-06 ENCOUNTER — Ambulatory Visit (INDEPENDENT_AMBULATORY_CARE_PROVIDER_SITE_OTHER)
Admission: RE | Admit: 2016-02-06 | Discharge: 2016-02-06 | Disposition: A | Discharge status: Home / Self Care | Payer: Medicare Other | Source: Ambulatory Visit | Attending: Family | Admitting: Family

## 2016-02-06 DIAGNOSIS — J181 Lobar pneumonia, unspecified organism: Principal | ICD-10-CM

## 2016-02-06 DIAGNOSIS — J189 Pneumonia, unspecified organism: Secondary | ICD-10-CM | POA: Diagnosis not present

## 2016-02-12 ENCOUNTER — Ambulatory Visit: Payer: Medicare Other | Admitting: Internal Medicine

## 2016-02-17 ENCOUNTER — Encounter: Payer: Self-pay | Admitting: Internal Medicine

## 2016-02-17 ENCOUNTER — Other Ambulatory Visit (INDEPENDENT_AMBULATORY_CARE_PROVIDER_SITE_OTHER): Payer: Medicare Other

## 2016-02-17 ENCOUNTER — Ambulatory Visit (INDEPENDENT_AMBULATORY_CARE_PROVIDER_SITE_OTHER): Payer: Medicare Other | Admitting: Internal Medicine

## 2016-02-17 VITALS — BP 120/68 | HR 76 | Temp 97.8°F | Resp 16 | Ht 65.0 in | Wt 192.8 lb

## 2016-02-17 DIAGNOSIS — I5033 Acute on chronic diastolic (congestive) heart failure: Secondary | ICD-10-CM | POA: Diagnosis not present

## 2016-02-17 DIAGNOSIS — J810 Acute pulmonary edema: Secondary | ICD-10-CM

## 2016-02-17 DIAGNOSIS — I10 Essential (primary) hypertension: Secondary | ICD-10-CM

## 2016-02-17 DIAGNOSIS — I5030 Unspecified diastolic (congestive) heart failure: Secondary | ICD-10-CM | POA: Insufficient documentation

## 2016-02-17 LAB — COMPREHENSIVE METABOLIC PANEL
ALBUMIN: 3.8 g/dL (ref 3.5–5.2)
ALK PHOS: 63 U/L (ref 39–117)
ALT: 14 U/L (ref 0–35)
AST: 19 U/L (ref 0–37)
BILIRUBIN TOTAL: 0.4 mg/dL (ref 0.2–1.2)
BUN: 12 mg/dL (ref 6–23)
CALCIUM: 9.2 mg/dL (ref 8.4–10.5)
CHLORIDE: 91 meq/L — AB (ref 96–112)
CO2: 31 mEq/L (ref 19–32)
Creatinine, Ser: 0.77 mg/dL (ref 0.40–1.20)
GFR: 75.22 mL/min (ref >60.00)
Glucose, Bld: 104 mg/dL — ABNORMAL HIGH (ref 70–99)
Potassium: 3.9 mEq/L (ref 3.5–5.1)
Sodium: 127 mEq/L — ABNORMAL LOW (ref 135–145)
Total Protein: 7.6 g/dL (ref 6.0–8.3)

## 2016-02-17 MED ORDER — FLUTICASONE FUROATE-VILANTEROL 100-25 MCG/INH IN AEPB: 1.0000 | INHALATION_SPRAY | Freq: Every day | RESPIRATORY_TRACT | 5 refills | Status: DC

## 2016-02-17 MED ORDER — FUROSEMIDE 20 MG PO TABS: 20.0000 mg | ORAL_TABLET | Freq: Two times a day (BID) | ORAL | 3 refills | Status: DC

## 2016-02-17 NOTE — Patient Instructions (Signed)

## 2016-02-17 NOTE — Progress Notes (Signed)
Subjective:  Patient ID: Carrie Crawford, female    DOB: 1926-05-08  Age: 82 y.o. MRN: [id number]  CC: Congestive Heart Failure   HPI Carrie Crawford presents for f/up regarding a recent cough and concerns that she may have CHF based on a recent CXR appearance. Her cough has mostly resolved and is NP and responds well to OTC cough suppressant. She denies CP, edema, palpitations, SOB, DOE, syncope, or diaphoresis. She takes HCTZ and an ARB for HTN.   Outpatient Medications Prior to Visit  Medication Sig Dispense Refill  . aspirin 81 MG tablet Take 81 mg by mouth daily.      Marland Kitchen azelastine (ASTELIN) 0.1 % nasal spray 1-2 sprays in each nostril at bedtime. 90 mL 0  . fluticasone (FLONASE) 50 MCG/ACT nasal spray 1-2 puffs each nostril, once or twice daily 16 g 11  . guaiFENesin (MUCINEX) 600 MG 12 hr tablet Take 2 tablets (1,200 mg total) by mouth 2 (two) times daily as needed for cough or to loosen phlegm. 60 tablet 1  . losartan (COZAAR) 100 MG tablet Take 1 tablet (100 mg total) by mouth daily. 90 tablet 3  . tiotropium (SPIRIVA HANDIHALER) 18 MCG inhalation capsule INHALE THE CONTENTS OF ONE CAPSULE ONCE DAILY 30 capsule 5  . Fluticasone Furoate-Vilanterol (BREO ELLIPTA) 100-25 MCG/INH AEPB Inhale 1 puff into the lungs daily. 60 each 5  . hydrochlorothiazide (HYDRODIURIL) 25 MG tablet TAKE 1/2 TABLET BY MOUTH ONCE DAILY 45 tablet 1  . Multiple Vitamins-Minerals (CENTRUM SILVER) tablet Take 1 tablet by mouth daily.      Marland Kitchen azithromycin (ZITHROMAX) 250 MG tablet Tale 500 mg PO on day 1, then 250 mg PO q24h x 4 days. 6 tablet 0  . benzonatate (TESSALON) 100 MG capsule Take 1 capsule (100 mg total) by mouth 3 (three) times daily as needed for cough. 20 capsule 1  . promethazine-dextromethorphan (PROMETHAZINE-DM) 6.25-15 MG/5ML syrup Take 5 mLs by mouth at bedtime as needed for cough. 40 mL 1   No facility-administered medications prior to visit.     ROS Review of Systems  Constitutional:  Negative for activity change, diaphoresis, fatigue and unexpected weight change.  HENT: Negative.   Eyes: Negative.  Negative for visual disturbance.  Respiratory: Positive for cough. Negative for chest tightness, shortness of breath, wheezing and stridor.   Cardiovascular: Negative.  Negative for chest pain, palpitations and leg swelling.  Gastrointestinal: Negative.  Negative for abdominal pain, constipation, diarrhea and vomiting.  Endocrine: Negative.   Genitourinary: Negative.  Negative for decreased urine volume, difficulty urinating, dysuria and urgency.  Musculoskeletal: Negative.  Negative for arthralgias, back pain, joint swelling and myalgias.  Skin: Negative.   Allergic/Immunologic: Negative.   Neurological: Negative.  Negative for dizziness, syncope, weakness, light-headedness, numbness and headaches.  Hematological: Negative.  Negative for adenopathy. Does not bruise/bleed easily.  Psychiatric/Behavioral: Negative.     Objective:  BP 120/68 (BP Location: Left Arm, Patient Position: Sitting, Cuff Size: Normal)   Pulse 76   Temp 97.8 F (36.6 C) (Oral)   Resp 16   Ht '5\' 5"'$  (1.651 m)   Wt 192 lb 12 oz (87.4 kg)   SpO2 95%   BMI 32.08 kg/m   BP Readings from Last 3 Encounters:  02/17/16 120/68  12/26/15 138/70  12/02/15 128/76    Wt Readings from Last 3 Encounters:  02/17/16 192 lb 12 oz (87.4 kg)  12/26/15 192 lb (87.1 kg)  12/02/15 196 lb 9.6 oz (89.2 kg)  Physical Exam  Constitutional: She is oriented to person, place, and time. No distress.  HENT:  Mouth/Throat: Oropharynx is clear and moist. No oropharyngeal exudate.  Eyes: Conjunctivae are normal. Right eye exhibits no discharge. Left eye exhibits no discharge. No scleral icterus.  Neck: Normal range of motion. Neck supple. No JVD present. No tracheal deviation present. No thyromegaly present.  Cardiovascular: Normal rate, regular rhythm, S1 normal, S2 normal, normal heart sounds and intact distal  pulses.  Frequent extrasystoles are present. Exam reveals no gallop.   No murmur heard. EKG ----  Sinus  Rhythm  - frequent PAC s  # PACs = 3. WITHIN NORMAL LIMITS   Pulmonary/Chest: Effort normal and breath sounds normal. No stridor. No respiratory distress. She has no wheezes. She has no rales. She exhibits no tenderness.  Abdominal: Soft. Bowel sounds are normal. She exhibits no distension and no mass. There is no tenderness. There is no rebound and no guarding.  Musculoskeletal: Normal range of motion. She exhibits no edema, tenderness or deformity.  Lymphadenopathy:    She has no cervical adenopathy.  Neurological: She is oriented to person, place, and time.  Skin: Skin is warm and dry. No rash noted. She is not diaphoretic. No erythema. No pallor.  Vitals reviewed.   Lab Results  Component Value Date   WBC 6.3 10/22/2015   HGB 13.6 10/22/2015   HCT 39.8 10/22/2015   PLT 281.0 10/22/2015   GLUCOSE 104 (H) 02/17/2016   CHOL 172 04/23/2015   TRIG 131.0 04/23/2015   HDL 56.30 04/23/2015   LDLCALC 89 04/23/2015   ALT 14 02/17/2016   AST 19 02/17/2016   NA 127 (L) 02/17/2016   K 3.9 02/17/2016   CL 91 (L) 02/17/2016   CREATININE 0.77 02/17/2016   BUN 12 02/17/2016   CO2 31 02/17/2016   TSH 2.07 04/23/2015    Dg Chest 2 View  Result Date: 02/06/2016 CLINICAL DATA:  Pneumonia. EXAM: CHEST  2 VIEW COMPARISON:  12/26/2015 . FINDINGS: Mediastinum and hilar structures normal. Cardiomegaly with pulmonary vascular prominence and mild interstitial prominence. Mild congestive heart failure cannot be excluded. No pleural effusion or pneumothorax. Pectus deformity. IMPRESSION: Cardiomegaly with mild pulmonary vascular prominence and bilateral interstitial prominence. Mild congestive heart failure cannot be excluded. Electronically Signed   By: Marcello Moores  Register   On: 02/06/2016 15:10   Assessment & Plan:   Carrie Crawford was seen today for congestive heart failure.  Diagnoses and all  orders for this visit:  Essential hypertension- Her pressures adequately well controlled however she has developed mild hyponatremia and hypochloremia due to the thiazide diuretics, will change to loop diuretic and will continue the ARB. -     Comprehensive metabolic panel; Future  Acute pulmonary edema ([place])- her EKG shows PACs but she is asymptomatic with respect to this, the EKG does not reveal left ventricular hypertrophy or ischemic changes, her BNP is not elevated, I think the recent chest x-ray appearance is based on her body habitus/obesity, nonetheless will change her from a thiazide to loop diuretic to get better diuresis -     EKG 12-Lead -     Brain natriuretic peptide; Future -     furosemide (LASIX) 20 MG tablet; Take 1 tablet (20 mg total) by mouth 2 (two) times daily.  Diastolic dysfunction with acute on chronic heart failure ([place])- will start a loop diuretic for diuresis -     Brain natriuretic peptide; Future -     furosemide (LASIX) 20 MG  tablet; Take 1 tablet (20 mg total) by mouth 2 (two) times daily.  Other orders -     fluticasone furoate-vilanterol (BREO ELLIPTA) 100-25 MCG/INH AEPB; Inhale 1 puff into the lungs daily.   I have discontinued Ms. Mckenney CENTRUM SILVER, hydrochlorothiazide, benzonatate, promethazine-dextromethorphan, and azithromycin. I am also having her start on furosemide. Additionally, I am having her maintain her aspirin, losartan, fluticasone, tiotropium, azelastine, guaiFENesin, and fluticasone furoate-vilanterol.  Meds ordered this encounter  Medications  . fluticasone furoate-vilanterol (BREO ELLIPTA) 100-25 MCG/INH AEPB    Sig: Inhale 1 puff into the lungs daily.    Dispense:  60 each    Refill:  5    Order Specific Question:   Lot Number?    Answer:   [id number]    Order Specific Question:   Expiration Date?    Answer:   07/13/2014    Order Specific Question:   Quantity    Answer:   1    Comments:   Sample  . furosemide (LASIX) 20 MG  tablet    Sig: Take 1 tablet (20 mg total) by mouth 2 (two) times daily.    Dispense:  60 tablet    Refill:  3     Follow-up: Return in about 2 months (around 04/18/2016).  Scarlette Calico, MD

## 2016-02-17 NOTE — Progress Notes (Signed)
Pre visit review using our clinic review tool, if applicable. No additional management support is needed unless otherwise documented below in the visit note. 

## 2016-02-18 ENCOUNTER — Encounter: Payer: Self-pay | Admitting: Internal Medicine

## 2016-02-18 DIAGNOSIS — I10 Essential (primary) hypertension: Secondary | ICD-10-CM

## 2016-02-18 LAB — BRAIN NATRIURETIC PEPTIDE: PRO B NATRI PEPTIDE: 83 pg/mL (ref 0.0–100.0)

## 2016-02-21 NOTE — Telephone Encounter (Signed)
Responded to pt and entered BMET with DX: [id number].

## 2016-03-02 ENCOUNTER — Other Ambulatory Visit: Payer: Self-pay | Admitting: *Deleted

## 2016-03-02 MED ORDER — LOSARTAN POTASSIUM 100 MG PO TABS: 100.0000 mg | ORAL_TABLET | Freq: Every day | ORAL | 3 refills | Status: DC

## 2016-03-02 NOTE — Telephone Encounter (Signed)
Rec'd call from pt son [name]) stating mom needing refill on Losartan. Sent refill electronically to [place]...Johny Chess

## 2016-03-25 ENCOUNTER — Other Ambulatory Visit (INDEPENDENT_AMBULATORY_CARE_PROVIDER_SITE_OTHER): Payer: Medicare Other

## 2016-03-25 ENCOUNTER — Encounter: Payer: Self-pay | Admitting: Internal Medicine

## 2016-03-25 DIAGNOSIS — I10 Essential (primary) hypertension: Secondary | ICD-10-CM | POA: Diagnosis not present

## 2016-03-25 LAB — BASIC METABOLIC PANEL
BUN: 12 mg/dL (ref 6–23)
CHLORIDE: 96 meq/L (ref 96–112)
CO2: 32 mEq/L (ref 19–32)
CREATININE: 0.79 mg/dL (ref 0.40–1.20)
Calcium: 8.8 mg/dL (ref 8.4–10.5)
GFR: 73.01 mL/min (ref >60.00)
Glucose, Bld: 103 mg/dL — ABNORMAL HIGH (ref 70–99)
Potassium: 4.4 mEq/L (ref 3.5–5.1)
Sodium: 133 mEq/L — ABNORMAL LOW (ref 135–145)

## 2016-04-07 ENCOUNTER — Other Ambulatory Visit (INDEPENDENT_AMBULATORY_CARE_PROVIDER_SITE_OTHER): Payer: Medicare Other

## 2016-04-07 ENCOUNTER — Encounter: Payer: Self-pay | Admitting: Nurse Practitioner

## 2016-04-07 ENCOUNTER — Ambulatory Visit (INDEPENDENT_AMBULATORY_CARE_PROVIDER_SITE_OTHER): Payer: Medicare Other | Admitting: Nurse Practitioner

## 2016-04-07 VITALS — BP 134/82 | HR 86 | Temp 98.0°F | Ht 65.0 in | Wt 192.0 lb

## 2016-04-07 DIAGNOSIS — M545 Low back pain, unspecified: Secondary | ICD-10-CM

## 2016-04-07 DIAGNOSIS — R300 Dysuria: Secondary | ICD-10-CM

## 2016-04-07 DIAGNOSIS — T2124XA Burn of second degree of lower back, initial encounter: Secondary | ICD-10-CM | POA: Diagnosis not present

## 2016-04-07 LAB — URINALYSIS
BILIRUBIN URINE: NEGATIVE
HGB URINE DIPSTICK: NEGATIVE
KETONES UR: NEGATIVE
LEUKOCYTES UA: NEGATIVE
NITRITE: NEGATIVE
Specific Gravity, Urine: 1.01 (ref 1.000–1.030)
TOTAL PROTEIN, URINE-UPE24: NEGATIVE
Urine Glucose: NEGATIVE
Urobilinogen, UA: 0.2 (ref 0.0–1.0)
pH: 6.5 (ref 5.0–8.0)

## 2016-04-07 LAB — POCT URINALYSIS DIPSTICK
Bilirubin, UA: NEGATIVE
Blood, UA: NEGATIVE
Glucose, UA: NEGATIVE
Ketones, UA: NEGATIVE
LEUKOCYTES UA: NEGATIVE
Nitrite, UA: NEGATIVE
PH UA: 7
PROTEIN UA: NEGATIVE
SPEC GRAV UA: 1.02
UROBILINOGEN UA: 0.2

## 2016-04-07 MED ORDER — MELOXICAM 7.5 MG PO TABS: 7.5000 mg | ORAL_TABLET | Freq: Every day | ORAL | 0 refills | Status: DC | PRN

## 2016-04-07 NOTE — Progress Notes (Signed)
Pre visit review using our clinic review tool, if applicable. No additional management support is needed unless otherwise documented below in the visit note. 

## 2016-04-07 NOTE — Patient Instructions (Addendum)
May use triple or Neosporin antibiotic ointment OTC twice a day as needed on ruptured blisters. Will call with urine culture results. Second-Degree Burn A second-degree burn affects the 2 outer layers of skin. The outer layer (epidermis) and the layer underneath it (dermis) are both burned. Another name for this type of burn is a partial thickness burn. A second-degree burn may be called minor or major. This depends on the size of the burn. It also depends on what parts of the skin are burned. Minor burns may be treated with first aid. Major burns are a medical emergency. A second-degree burn is worse than a first-degree burn, but not as bad as a third-degree burn. A first-degree burn affects only the epidermis. A third-degree burn goes through all the layers of skin. A second-degree burn usually heals in 3 to 4 weeks. A minor second-degree burn usually does not leave a scar.Deeper second-degree burns may lead to scarring of the skin or contractures over joints.Contractures are scars that form over joints and may lead to reduced mobility at those joints. CAUSES  Heat (thermal) injury. This happens when skin comes in contact with something very hot. It could be a flame, a hot object, hot liquid, or steam. Most second-degree burns are thermal injuries.  Radiation. Sunlight is one type of radiation that can burn the skin. Another type of radiation is used to heat food. Radiation is also used to treat some diseases, such as cancer. All types of radiation can burn the skin. Sunlight usually causes a first-degree burn. Radiation used for heating food or treating a disease can cause a second-degree burn.  Electricity. Electrical burns can cause more damage under the skin than on the surface. They should always be treated as major burns.  Chemicals. Many chemicals can burn the skin. The burn should be flushed with cool water and checked by an emergency caregiver. SYMPTOMS Symptoms of second-degree burns  include:  Severe pain.  Extreme tenderness.  Deep redness.  Blistered skin.  Skin that has changed color.It might look blotchy, wet, or shiny.  Swelling. TREATMENT Some second-degree burns may need to be treated in a hospital. These include major burns, electrical burns, and chemical burns. Many other second-degree burns can be treated with regular first aid, such as:  Cooling the burn. Use cool, germ-free (sterile) salt water. Place the burned area of skin into a tub of water, or cover the burned area with clean, wet towels.  Taking pain medicine.  Removing the dead skin from broken blisters. A trained caregiver may do this. Do not pop blisters.  Gently washing your skin with mild soap.  Covering the burned area with a cream.Silver sulfadiazine is a cream for burns. An antibiotic cream, such as bacitracin, may also be used to fight infection. Do not use other ointments or creams unless your caregiver says it is okay.  Protecting the burn with a sterile, non-sticky bandage.  Bandaging fingers and toes separately. This keeps them from sticking together.  Taking an antibiotic. This can help prevent infection.  Getting a tetanus shot. HOME CARE INSTRUCTIONS Medication  Take any medicine prescribed by your caregiver. Follow the directions carefully.  Ask your caregiver if you can take over-the-counter medicine to relieve pain and swelling. Do not give aspirin to children.  Make sure your caregiver knows about all other medicines you take.This includes over-the-counter medicines. Burn care  You will need to change the bandage on your burn. You may need to do this 2 or 3  times each day.  Gently clean the burned area.  Put ointment on it.  Cover the burn with a sterile bandage.  For some deeper burns or burns that cover a large area, compression garments may be prescribed. These garments can help minimize scarring and protect your mobility.  Do not put butter or oil  on your skin. Use only the cream prescribed by your caregiver.  Do not put ice on your burn.  Do not break blisters on your skin.  Keep the bandaged area dry. You might need to take a sponge bath for awhile.Ask your caregiver when you can take a shower or a tub bath again.  Do not scratch an itchy burn. Your caregiver may give you medicine to relieve very bad itching.  Infection is a big danger after a second-degree burn. Tell your caregiver right away if you have signs of infection, such as:  Redness or changing color in the burned area.  Fluid leaking from the burn.  Swelling in the burn area.  A bad smell coming from the wound. Follow-up  Keep all follow-up appointments.This is important. This is how your caregiver can tell if your treatment is working.  Protect your burn from sunlight.Use sunscreen whenever you go outside.Burned areas may be sensitive to the sun for up to 1 year. Exposure to the sun may also cause permanent darkening of scars. SEEK MEDICAL CARE IF:  You have any questions about medicines.  You have any questions about your treatment.  You wonder if it is okay to do a particular activity.  You develop a fever of more than 100.5 F (38.1 C). SEEK IMMEDIATE MEDICAL CARE IF:  You think your burn might be infected. It may change color, become red, leak fluid, swell, or smell bad.  You develop a fever of more than 102 F (38.9 C).   This information is not intended to replace advice given to you by your health care provider. Make sure you discuss any questions you have with your health care provider.   Document Released: 12/01/2010 Document Revised: 09/21/2011 Document Reviewed: 12/01/2010 Elsevier Interactive Patient Education [employer].

## 2016-04-07 NOTE — Progress Notes (Signed)
Subjective:  Patient ID: Carrie Crawford, female    DOB: 03-16-1926  Age: 82 y.o. MRN: [id number]  CC: Blister (Pt son stated have blister lower right back have redness/painful.)   Rash  This is a new problem. The current episode started in the past 7 days. The problem is unchanged. The affected locations include the back (Right-side). The rash is characterized by redness. Associated with: Used a heating pad last Wednesday to relieve what back pain. Pertinent negatives include no anorexia or fever. (No diarrhea, no constipation, no abdominal pain, no urinary symptoms,) Past treatments include nothing. Her past medical history is significant for varicella.  Back Pain  This is a recurrent problem. The current episode started in the past 7 days. The problem occurs constantly. The problem is unchanged. The pain is present in the lumbar spine. The quality of the pain is described as aching. The pain does not radiate. The pain is moderate. The pain is the same all the time. Pertinent negatives include no abdominal pain, bowel incontinence, dysuria, fever, leg pain, numbness, perianal numbness, tingling or weakness. Risk factors include poor posture, sedentary lifestyle, obesity, menopause and lack of exercise. She has tried heat for the symptoms. The treatment provided no relief.    Outpatient Medications Prior to Visit  Medication Sig Dispense Refill  . aspirin 81 MG tablet Take 81 mg by mouth daily.      Marland Kitchen azelastine (ASTELIN) 0.1 % nasal spray 1-2 sprays in each nostril at bedtime. 90 mL 0  . fluticasone (FLONASE) 50 MCG/ACT nasal spray 1-2 puffs each nostril, once or twice daily 16 g 11  . fluticasone furoate-vilanterol (BREO ELLIPTA) 100-25 MCG/INH AEPB Inhale 1 puff into the lungs daily. 60 each 5  . furosemide (LASIX) 20 MG tablet Take 1 tablet (20 mg total) by mouth 2 (two) times daily. 60 tablet 3  . guaiFENesin (MUCINEX) 600 MG 12 hr tablet Take 2 tablets (1,200 mg total) by mouth 2 (two)  times daily as needed for cough or to loosen phlegm. 60 tablet 1  . losartan (COZAAR) 100 MG tablet Take 1 tablet (100 mg total) by mouth daily. 90 tablet 3  . tiotropium (SPIRIVA HANDIHALER) 18 MCG inhalation capsule INHALE THE CONTENTS OF ONE CAPSULE ONCE DAILY 30 capsule 5   No facility-administered medications prior to visit.     ROS See HPI  Objective:  BP 134/82   Pulse 86   Temp 98 F (36.7 C)   Ht '5\' 5"'$  (1.651 m)   Wt 192 lb (87.1 kg)   SpO2 98%   BMI 31.95 kg/m   BP Readings from Last 3 Encounters:  04/07/16 134/82  02/17/16 120/68  12/26/15 138/70    Wt Readings from Last 3 Encounters:  04/07/16 192 lb (87.1 kg)  02/17/16 192 lb 12 oz (87.4 kg)  12/26/15 192 lb (87.1 kg)    Physical Exam  Constitutional: She is oriented to person, place, and time.  HENT:  Mouth/Throat: No oropharyngeal exudate.  Cardiovascular: Normal rate.   Pulmonary/Chest: Effort normal.  Musculoskeletal: She exhibits tenderness.       Lumbar back: She exhibits tenderness. She exhibits no bony tenderness and no swelling.  Tenderness across lower lumbosacral paraspinous muscle.  Neurological: She is alert and oriented to person, place, and time.  Skin: Skin is warm and dry. No rash noted. There is erythema.     No induration, no drainage. 2 discrete ruptured blisters with surrounding erythema.  Psychiatric: She has a normal  mood and affect.  Vitals reviewed.   Lab Results  Component Value Date   WBC 6.3 10/22/2015   HGB 13.6 10/22/2015   HCT 39.8 10/22/2015   PLT 281.0 10/22/2015   GLUCOSE 103 (H) 03/25/2016   CHOL 172 04/23/2015   TRIG 131.0 04/23/2015   HDL 56.30 04/23/2015   LDLCALC 89 04/23/2015   ALT 14 02/17/2016   AST 19 02/17/2016   NA 133 (L) 03/25/2016   K 4.4 03/25/2016   CL 96 03/25/2016   CREATININE 0.79 03/25/2016   BUN 12 03/25/2016   CO2 32 03/25/2016   TSH 2.07 04/23/2015    Assessment & Plan:  Consider Lumbar spine x-ray if no improvement in  1week.  Doyce was seen today for blister.  Diagnoses and all orders for this visit:  Bilateral low back pain without sciatica -     POCT Urinalysis Dipstick  Second degree burn of lower back, initial encounter   I am having Ms. Bejarano maintain her aspirin, fluticasone, tiotropium, azelastine, guaiFENesin, fluticasone furoate-vilanterol, furosemide, losartan, multivitamin, and ibuprofen.  Meds ordered this encounter  Medications  . Multiple Vitamin (MULTIVITAMIN) tablet    Sig: Take 1 tablet by mouth daily.  Marland Kitchen ibuprofen (ADVIL,MOTRIN) 200 MG tablet    Sig: Take 200 mg by mouth every 6 (six) hours as needed.    Follow-up: Return if symptoms worsen or fail to improve.  Wilfred Lacy, NP

## 2016-04-07 NOTE — Progress Notes (Signed)
Reviewed with patient in office. See office note

## 2016-04-13 ENCOUNTER — Other Ambulatory Visit: Payer: Medicare Other

## 2016-04-13 ENCOUNTER — Ambulatory Visit (INDEPENDENT_AMBULATORY_CARE_PROVIDER_SITE_OTHER): Payer: Medicare Other | Admitting: Nurse Practitioner

## 2016-04-13 ENCOUNTER — Encounter: Payer: Self-pay | Admitting: Nurse Practitioner

## 2016-04-13 ENCOUNTER — Ambulatory Visit (INDEPENDENT_AMBULATORY_CARE_PROVIDER_SITE_OTHER)
Admission: RE | Admit: 2016-04-13 | Discharge: 2016-04-13 | Disposition: A | Discharge status: Home / Self Care | Payer: Medicare Other | Source: Ambulatory Visit | Attending: Nurse Practitioner | Admitting: Nurse Practitioner

## 2016-04-13 VITALS — BP 136/72 | HR 86 | Temp 97.6°F | Ht 65.0 in | Wt 192.0 lb

## 2016-04-13 DIAGNOSIS — L03312 Cellulitis of back [any part except buttock]: Secondary | ICD-10-CM

## 2016-04-13 DIAGNOSIS — K5900 Constipation, unspecified: Secondary | ICD-10-CM

## 2016-04-13 DIAGNOSIS — T2124XA Burn of second degree of lower back, initial encounter: Secondary | ICD-10-CM | POA: Diagnosis not present

## 2016-04-13 DIAGNOSIS — M5135 Other intervertebral disc degeneration, thoracolumbar region: Secondary | ICD-10-CM

## 2016-04-13 DIAGNOSIS — M545 Low back pain, unspecified: Secondary | ICD-10-CM

## 2016-04-13 DIAGNOSIS — M4854XA Collapsed vertebra, not elsewhere classified, thoracic region, initial encounter for fracture: Secondary | ICD-10-CM | POA: Diagnosis not present

## 2016-04-13 MED ORDER — TRAMADOL HCL 50 MG PO TABS: 50.0000 mg | ORAL_TABLET | Freq: Three times a day (TID) | ORAL | 0 refills | Status: DC | PRN

## 2016-04-13 MED ORDER — CEPHALEXIN 500 MG PO CAPS
500.0000 mg | ORAL_CAPSULE | Freq: Two times a day (BID) | ORAL | 0 refills | Status: DC
Start: 2016-04-13 — End: 2016-04-23

## 2016-04-13 MED ORDER — POLYETHYLENE GLYCOL 3350 17 GM/SCOOP PO POWD: 17.0000 g | Freq: Every day | ORAL | 1 refills | Status: DC

## 2016-04-13 NOTE — Progress Notes (Signed)
Pre visit review using our clinic review tool, if applicable. No additional management support is needed unless otherwise documented below in the visit note. 

## 2016-04-13 NOTE — Progress Notes (Addendum)
Subjective:  Patient ID: Carrie Crawford, female    DOB: 08-10-1925  Age: 82 y.o. MRN: [id number]  CC: Back Pain (Pt stated still having back pain and the Rx. Meloxicam is not helping much.)   Back Pain  This is a new problem. The current episode started 1 to 4 weeks ago. The problem occurs constantly. The problem is unchanged. The pain is present in the lumbar spine and sacro-iliac. The quality of the pain is described as aching and cramping. The pain does not radiate. The pain is severe. The pain is the same all the time. Exacerbated by: with movement, difficulty getting out of bed. Associated symptoms include tingling and weakness. Pertinent negatives include no abdominal pain, bladder incontinence, bowel incontinence, chest pain, dysuria, fever, headaches, leg pain, paresis, paresthesias, pelvic pain or weight loss. (Reports constipation, has to strain, BM is hard) Risk factors include sedentary lifestyle, poor posture, menopause and obesity. She has tried NSAIDs for the symptoms. The treatment provided no relief.  Wound Check  She was originally treated 5 to 10 days ago. Previous treatment included wound cleansing or irrigation. Her temperature was unmeasured prior to arrival. There has been no drainage from the wound. The redness has worsened. There is no swelling present. The pain has no pain.   Persistent lower back pain, no improvement will meloxicam.  Outpatient Medications Prior to Visit  Medication Sig Dispense Refill  . aspirin 81 MG tablet Take 81 mg by mouth daily.      Marland Kitchen azelastine (ASTELIN) 0.1 % nasal spray 1-2 sprays in each nostril at bedtime. 90 mL 0  . fluticasone (FLONASE) 50 MCG/ACT nasal spray 1-2 puffs each nostril, once or twice daily 16 g 11  . fluticasone furoate-vilanterol (BREO ELLIPTA) 100-25 MCG/INH AEPB Inhale 1 puff into the lungs daily. 60 each 5  . furosemide (LASIX) 20 MG tablet Take 1 tablet (20 mg total) by mouth 2 (two) times daily. 60 tablet 3  .  guaiFENesin (MUCINEX) 600 MG 12 hr tablet Take 2 tablets (1,200 mg total) by mouth 2 (two) times daily as needed for cough or to loosen phlegm. 60 tablet 1  . losartan (COZAAR) 100 MG tablet Take 1 tablet (100 mg total) by mouth daily. 90 tablet 3  . Multiple Vitamin (MULTIVITAMIN) tablet Take 1 tablet by mouth daily.    Marland Kitchen tiotropium (SPIRIVA HANDIHALER) 18 MCG inhalation capsule INHALE THE CONTENTS OF ONE CAPSULE ONCE DAILY 30 capsule 5  . meloxicam (MOBIC) 7.5 MG tablet Take 1 tablet (7.5 mg total) by mouth daily as needed for pain. With food 20 tablet 0  . ibuprofen (ADVIL,MOTRIN) 200 MG tablet Take 200 mg by mouth every 6 (six) hours as needed.     No facility-administered medications prior to visit.     ROS See HPI  Objective:  BP 136/72 (BP Location: Left Arm, Patient Position: Sitting, Cuff Size: Large)   Pulse 86   Temp 97.6 F (36.4 C)   Ht '5\' 5"'$  (1.651 m)   Wt 192 lb (87.1 kg)   SpO2 98%   BMI 31.95 kg/m   BP Readings from Last 3 Encounters:  04/13/16 136/72  04/07/16 134/82  02/17/16 120/68    Wt Readings from Last 3 Encounters:  04/13/16 192 lb (87.1 kg)  04/07/16 192 lb (87.1 kg)  02/17/16 192 lb 12 oz (87.4 kg)    Physical Exam  Musculoskeletal: She exhibits tenderness.       Lumbar back: She exhibits tenderness and pain.  She exhibits no bony tenderness and no spasm.  In wheelchair. Able to stand with one person assist and cane. Tenderness along bilateral lumbar paraspinous muscle.  Neurological: She is alert.  Skin: There is erythema.       Lab Results  Component Value Date   WBC 6.3 10/22/2015   HGB 13.6 10/22/2015   HCT 39.8 10/22/2015   PLT 281.0 10/22/2015   GLUCOSE 103 (H) 03/25/2016   CHOL 172 04/23/2015   TRIG 131.0 04/23/2015   HDL 56.30 04/23/2015   LDLCALC 89 04/23/2015   ALT 14 02/17/2016   AST 19 02/17/2016   NA 133 (L) 03/25/2016   K 4.4 03/25/2016   CL 96 03/25/2016   CREATININE 0.79 03/25/2016   BUN 12 03/25/2016   CO2 32  03/25/2016   TSH 2.07 04/23/2015    Dg Chest 2 View  Result Date: 02/06/2016 CLINICAL DATA:  Pneumonia. EXAM: CHEST  2 VIEW COMPARISON:  12/26/2015 . FINDINGS: Mediastinum and hilar structures normal. Cardiomegaly with pulmonary vascular prominence and mild interstitial prominence. Mild congestive heart failure cannot be excluded. No pleural effusion or pneumothorax. Pectus deformity. IMPRESSION: Cardiomegaly with mild pulmonary vascular prominence and bilateral interstitial prominence. Mild congestive heart failure cannot be excluded. Electronically Signed   By: Marcello Moores  Register   On: 02/06/2016 15:10   Assessment & Plan:   Carrie Crawford was seen today for back pain.  Diagnoses and all orders for this visit:  Non-traumatic compression fracture of twelfth thoracic vertebra, initial encounter ([place]) -     DG Lumbar Spine Complete; Future -     Urine culture; Future -     DG Abd 1 View; Future -     traMADol (ULTRAM) 50 MG tablet; Take 1 tablet (50 mg total) by mouth every 8 (eight) hours as needed. -     Calcium Carbonate-Vitamin D 600-400 MG-UNIT tablet; Take 1 tablet by mouth 2 (two) times daily. -     Ambulatory referral to Physical Therapy  Cellulitis of back except buttock -     cephALEXin (KEFLEX) 500 MG capsule; Take 1 capsule (500 mg total) by mouth 2 (two) times daily.  Second degree burn of lower back, initial encounter -     cephALEXin (KEFLEX) 500 MG capsule; Take 1 capsule (500 mg total) by mouth 2 (two) times daily.  Constipation, unspecified constipation type -     DG Abd 1 View; Future -     polyethylene glycol powder (GLYCOLAX/MIRALAX) powder; Take 17 g by mouth daily.  DDD (degenerative disc disease), thoracolumbar -     Calcium Carbonate-Vitamin D 600-400 MG-UNIT tablet; Take 1 tablet by mouth 2 (two) times daily. -     Ambulatory referral to Physical Therapy   I have discontinued Carrie Crawford ibuprofen and meloxicam. I am also having her start on cephALEXin, traMADol,  polyethylene glycol powder, and Calcium Carbonate-Vitamin D. Additionally, I am having her maintain her aspirin, fluticasone, tiotropium, azelastine, guaiFENesin, fluticasone furoate-vilanterol, furosemide, losartan, multivitamin, and acetaminophen.  Meds ordered this encounter  Medications  . acetaminophen (TYLENOL) 500 MG tablet    Sig: Take 500 mg by mouth every 6 (six) hours as needed.  . cephALEXin (KEFLEX) 500 MG capsule    Sig: Take 1 capsule (500 mg total) by mouth 2 (two) times daily.    Dispense:  20 capsule    Refill:  0    Order Specific Question:   Supervising Provider    Answer:   Cassandria Anger [[id number]]  . traMADol (  ULTRAM) 50 MG tablet    Sig: Take 1 tablet (50 mg total) by mouth every 8 (eight) hours as needed.    Dispense:  30 tablet    Refill:  0    Order Specific Question:   Supervising Provider    Answer:   Cassandria Anger [[id number]]  . polyethylene glycol powder (GLYCOLAX/MIRALAX) powder    Sig: Take 17 g by mouth daily.    Dispense:  3350 g    Refill:  1    Order Specific Question:   Supervising Provider    Answer:   Cassandria Anger [[id number]]  . Calcium Carbonate-Vitamin D 600-400 MG-UNIT tablet    Sig: Take 1 tablet by mouth 2 (two) times daily.    Dispense:  30 tablet    Refill:  0    Order Specific Question:   Supervising Provider    Answer:   Cassandria Anger [[id number]]   Follow-up: Return if symptoms worsen or fail to improve.  Wilfred Lacy, NP

## 2016-04-13 NOTE — Patient Instructions (Addendum)
Go to basement for x-ray and urine specimen. Will call with lab  And x-ray results.  change dressing once a day. Clean wound with water and soap. May apply triple antibiotics ointment once a day with dressing change.

## 2016-04-14 LAB — URINE CULTURE: ORGANISM ID, BACTERIA: NO GROWTH

## 2016-04-14 MED ORDER — CALCIUM CARBONATE-VITAMIN D 600-400 MG-UNIT PO TABS: 1.0000 | ORAL_TABLET | Freq: Two times a day (BID) | ORAL | 0 refills | Status: DC

## 2016-04-23 ENCOUNTER — Ambulatory Visit (INDEPENDENT_AMBULATORY_CARE_PROVIDER_SITE_OTHER): Payer: Medicare Other | Admitting: Internal Medicine

## 2016-04-23 ENCOUNTER — Telehealth: Payer: Self-pay | Admitting: Emergency Medicine

## 2016-04-23 ENCOUNTER — Encounter: Payer: Self-pay | Admitting: Internal Medicine

## 2016-04-23 ENCOUNTER — Encounter: Payer: Medicare Other | Admitting: Internal Medicine

## 2016-04-23 VITALS — BP 126/74 | HR 114 | Temp 98.2°F | Resp 16 | Wt 189.0 lb

## 2016-04-23 DIAGNOSIS — S22009D Unspecified fracture of unspecified thoracic vertebra, subsequent encounter for fracture with routine healing: Secondary | ICD-10-CM | POA: Insufficient documentation

## 2016-04-23 DIAGNOSIS — E2839 Other primary ovarian failure: Secondary | ICD-10-CM | POA: Diagnosis not present

## 2016-04-23 DIAGNOSIS — M8000XA Age-related osteoporosis with current pathological fracture, unspecified site, initial encounter for fracture: Secondary | ICD-10-CM

## 2016-04-23 DIAGNOSIS — I5033 Acute on chronic diastolic (congestive) heart failure: Secondary | ICD-10-CM | POA: Diagnosis not present

## 2016-04-23 DIAGNOSIS — Z23 Encounter for immunization: Secondary | ICD-10-CM | POA: Diagnosis not present

## 2016-04-23 DIAGNOSIS — J449 Chronic obstructive pulmonary disease, unspecified: Secondary | ICD-10-CM

## 2016-04-23 DIAGNOSIS — J4489 Other specified chronic obstructive pulmonary disease: Secondary | ICD-10-CM

## 2016-04-23 MED ORDER — ZOSTER VACCINE LIVE 19400 UNT/0.65ML ~~LOC~~ SUSR: 0.6500 mL | Freq: Once | SUBCUTANEOUS | 0 refills | Status: DC

## 2016-04-23 NOTE — Progress Notes (Signed)
Pre visit review using our clinic review tool, if applicable. No additional management support is needed unless otherwise documented below in the visit note. 

## 2016-04-23 NOTE — Progress Notes (Signed)
Subjective:  Patient ID: Carrie Crawford, female    DOB: 1926/02/18  Age: 82 y.o. MRN: [id number]  CC: Back Pain and Hypertension   HPI Carrie Crawford presents for follow-up on low back pain that was recently found to be a spontaneous vertebral fracture at T12 indicating she most likely has severe osteoporosis. The pain is better over the last week and does not radiate towards her lower extremities and she denies numbness, weakness, tingling. She denies any trauma or injury at the time. She is getting symptom relief with tramadol and tells me she does not wet take an anti-inflammatory because she is concerned about fluid retention and kidney function. She is willing to do a bone density test to see how severe the osteoporosis is. She also wants to see if she can have home health care to help her with physical therapy to increase her mobility and help control her pain.  Outpatient Medications Prior to Visit  Medication Sig Dispense Refill  . aspirin 81 MG tablet Take 81 mg by mouth daily.      Marland Kitchen azelastine (ASTELIN) 0.1 % nasal spray 1-2 sprays in each nostril at bedtime. 90 mL 0  . Calcium Carbonate-Vitamin D 600-400 MG-UNIT tablet Take 1 tablet by mouth 2 (two) times daily. 30 tablet 0  . fluticasone (FLONASE) 50 MCG/ACT nasal spray 1-2 puffs each nostril, once or twice daily 16 g 11  . fluticasone furoate-vilanterol (BREO ELLIPTA) 100-25 MCG/INH AEPB Inhale 1 puff into the lungs daily. 60 each 5  . furosemide (LASIX) 20 MG tablet Take 1 tablet (20 mg total) by mouth 2 (two) times daily. 60 tablet 3  . losartan (COZAAR) 100 MG tablet Take 1 tablet (100 mg total) by mouth daily. 90 tablet 3  . Multiple Vitamin (MULTIVITAMIN) tablet Take 1 tablet by mouth daily.    . polyethylene glycol powder (GLYCOLAX/MIRALAX) powder Take 17 g by mouth daily. 3350 g 1  . tiotropium (SPIRIVA HANDIHALER) 18 MCG inhalation capsule INHALE THE CONTENTS OF ONE CAPSULE ONCE DAILY 30 capsule 5  . traMADol (ULTRAM)  50 MG tablet Take 1 tablet (50 mg total) by mouth every 8 (eight) hours as needed. 30 tablet 0  . acetaminophen (TYLENOL) 500 MG tablet Take 500 mg by mouth every 6 (six) hours as needed.    . cephALEXin (KEFLEX) 500 MG capsule Take 1 capsule (500 mg total) by mouth 2 (two) times daily. 20 capsule 0  . guaiFENesin (MUCINEX) 600 MG 12 hr tablet Take 2 tablets (1,200 mg total) by mouth 2 (two) times daily as needed for cough or to loosen phlegm. 60 tablet 1   No facility-administered medications prior to visit.     ROS Review of Systems  Constitutional: Positive for activity change. Negative for appetite change, diaphoresis, fatigue and fever.  HENT: Negative.   Eyes: Negative.   Respiratory: Negative.  Negative for cough, choking, chest tightness, shortness of breath and stridor.   Cardiovascular: Negative.  Negative for chest pain, palpitations and leg swelling.  Gastrointestinal: Negative.  Negative for abdominal pain, blood in stool, constipation, diarrhea, nausea and vomiting.  Endocrine: Negative.   Genitourinary: Negative.  Negative for difficulty urinating.  Musculoskeletal: Positive for back pain and gait problem. Negative for arthralgias, myalgias and neck pain.  Skin: Negative.   Allergic/Immunologic: Negative.   Neurological: Negative for dizziness, tremors, speech difficulty, weakness and light-headedness.  Hematological: Negative.  Negative for adenopathy. Does not bruise/bleed easily.  Psychiatric/Behavioral: Negative.     Objective:  BP 126/74 (BP Location: Left Arm, Patient Position: Sitting, Cuff Size: Large)   Pulse (!) 114   Temp 98.2 F (36.8 C) (Oral)   Wt 189 lb (85.7 kg)   SpO2 93%   BMI 31.45 kg/m   BP Readings from Last 3 Encounters:  04/23/16 126/74  04/13/16 136/72  04/07/16 134/82    Wt Readings from Last 3 Encounters:  04/23/16 189 lb (85.7 kg)  04/13/16 192 lb (87.1 kg)  04/07/16 192 lb (87.1 kg)    Physical Exam  Constitutional: She is  oriented to person, place, and time.  Non-toxic appearance. She has a sickly appearance. She appears ill. No distress.  Frail, obese, wheelchair-bound  HENT:  Mouth/Throat: Oropharynx is clear and moist. No oropharyngeal exudate.  Eyes: Conjunctivae are normal. Right eye exhibits no discharge. Left eye exhibits no discharge. No scleral icterus.  Neck: Normal range of motion. Neck supple. No JVD present. No tracheal deviation present. No thyromegaly present.  Cardiovascular: Normal rate, regular rhythm, normal heart sounds and intact distal pulses.  Exam reveals no gallop and no friction rub.   No murmur heard. Pulmonary/Chest: Effort normal and breath sounds normal. No stridor. No respiratory distress. She has no wheezes. She has no rales. She exhibits no tenderness.  Abdominal: Soft. Bowel sounds are normal. She exhibits no distension and no mass. There is no tenderness. There is no rebound and no guarding.  Musculoskeletal: She exhibits no edema, tenderness or deformity.       Lumbar back: She exhibits decreased range of motion and bony tenderness. She exhibits no tenderness, no swelling, no deformity and no spasm.  Lymphadenopathy:    She has no cervical adenopathy.  Neurological: She is alert and oriented to person, place, and time. She displays no atrophy, no tremor and normal reflexes. No cranial nerve deficit. She exhibits normal muscle tone.  Neg SLR in BLE  Skin: Skin is warm. No rash noted. She is not diaphoretic. No erythema. No pallor.    Lab Results  Component Value Date   WBC 6.3 10/22/2015   HGB 13.6 10/22/2015   HCT 39.8 10/22/2015   PLT 281.0 10/22/2015   GLUCOSE 103 (H) 03/25/2016   CHOL 172 04/23/2015   TRIG 131.0 04/23/2015   HDL 56.30 04/23/2015   LDLCALC 89 04/23/2015   ALT 14 02/17/2016   AST 19 02/17/2016   NA 133 (L) 03/25/2016   K 4.4 03/25/2016   CL 96 03/25/2016   CREATININE 0.79 03/25/2016   BUN 12 03/25/2016   CO2 32 03/25/2016   TSH 2.07 04/23/2015     Dg Lumbar Spine Complete  Result Date: 04/13/2016 CLINICAL DATA:  Low back pain for 2 weeks EXAM: LUMBAR SPINE - COMPLETE 4+ VIEW COMPARISON:  02/06/2016 chest x-ray FINDINGS: T12 vertebral body compression fracture which is new compared with 02/06/2016. Alignment is normal. Dextrocurvature of the lumbar spine. Degenerative disc disease with disc height loss at L3-4, L4-5 and L5-S1 with facet disease. Osteoarthritis of bilateral SI joints. IMPRESSION: T12 vertebral body compression fracture which is new compared with 02/06/2016. Electronically Signed   By: Kathreen Devoid   On: 04/13/2016 15:19   Dg Abd 1 View  Result Date: 04/13/2016 CLINICAL DATA:  Low back pain for 2 weeks.  No known injury. EXAM: ABDOMEN - 1 VIEW COMPARISON:  None. FINDINGS: Asymmetric loss of interspace height at L3-4 on the LEFT and L4-5 on the RIGHT with osseous spurring. Demineralization suggesting osteopenia in this elderly female. No lateral radiograph was  provided to assess for possible lumbar compression deformity. Consider formal lumbar spine series when stable. Gas pattern nonspecific. Cholecystectomy. Aortic vascular calcification, non aneurysmal. IMPRESSION: Degenerative changes as described. Electronically Signed   By: Staci Righter M.D.   On: 04/13/2016 15:14    Assessment & Plan:   Carrie Crawford was seen today for back pain and hypertension.  Diagnoses and all orders for this visit:  Age-related osteoporosis with current pathological fracture, initial encounter- I'm not sure that she is a candidate for treatment of osteoporosis but at the request of the patient and her son I've ordered a bone density test see how severe the osteoporosis is. -     DG Bone Density; Future  Estrogen deficiency- as above -     DG Bone Density; Future  Fracture of posterior thoracic vertebral body with routine healing- will continue tramadol as needed for pain. -     Ambulatory referral to [facility]  Diastolic dysfunction with  acute on chronic heart failure ([place])- she has a normal volume status and her blood pressure is well-controlled, will avoid anti-inflammatories to prevent fluid retention -     Ambulatory referral to [facility]  Obstructive chronic bronchitis without exacerbation ([place])- her symptoms are currently well controlled with LABA/LAMA/ICS inhalers. -     Ambulatory referral to [facility]  Need for prophylactic vaccination and inoculation against influenza -     Flu vaccine HIGH DOSE PF (Fluzone High dose)  Other orders -     Discontinue: Zoster Vaccine Live, PF, (ZOSTAVAX) 17127 UNT/0.65ML injection; Inject 19,400 Units into the skin once.   I have discontinued Ms. Rewis guaiFENesin, acetaminophen, cephALEXin, and Zoster Vaccine Live (PF). I am also having her maintain her aspirin, fluticasone, tiotropium, azelastine, fluticasone furoate-vilanterol, furosemide, losartan, multivitamin, traMADol, polyethylene glycol powder, and Calcium Carbonate-Vitamin D.  Meds ordered this encounter  Medications  . DISCONTD: Zoster Vaccine Live, PF, (ZOSTAVAX) 87183 UNT/0.65ML injection    Sig: Inject 19,400 Units into the skin once.    Dispense:  1 each    Refill:  0     Follow-up: Return if symptoms worsen or fail to improve.  Scarlette Calico, MD

## 2016-04-23 NOTE — Telephone Encounter (Signed)
Pharmacy called and stated patients insurance stated patient got the shingles vaccination in 02/2011. They will not cover the injection now since it has already been given. Just wanted you to be aware. Thanks.

## 2016-04-23 NOTE — Patient Instructions (Signed)

## 2016-04-27 ENCOUNTER — Other Ambulatory Visit: Payer: Self-pay | Admitting: Nurse Practitioner

## 2016-04-27 DIAGNOSIS — M4854XA Collapsed vertebra, not elsewhere classified, thoracic region, initial encounter for fracture: Secondary | ICD-10-CM

## 2016-04-27 MED ORDER — TRAMADOL HCL 50 MG PO TABS: 50.0000 mg | ORAL_TABLET | Freq: Three times a day (TID) | ORAL | 5 refills | Status: DC | PRN

## 2016-04-27 NOTE — Telephone Encounter (Signed)
Please advise if rf is okay. Thank you!

## 2016-04-28 NOTE — Telephone Encounter (Signed)
Faxed script back to [facility].../lmb 

## 2016-05-12 ENCOUNTER — Telehealth: Payer: Self-pay | Admitting: Internal Medicine

## 2016-05-12 MED ORDER — TIOTROPIUM BROMIDE MONOHYDRATE 18 MCG IN CAPS: ORAL_CAPSULE | RESPIRATORY_TRACT | 1 refills | Status: DC

## 2016-05-12 NOTE — Telephone Encounter (Signed)
Pt request refill for tiotropium (SPIRIVA HANDIHALER) 18 MCG inhalation capsule send to [facility].

## 2016-05-12 NOTE — Telephone Encounter (Signed)
Erx sent

## 2016-06-01 DIAGNOSIS — I110 Hypertensive heart disease with heart failure: Secondary | ICD-10-CM | POA: Diagnosis not present

## 2016-06-01 DIAGNOSIS — I5033 Acute on chronic diastolic (congestive) heart failure: Secondary | ICD-10-CM | POA: Diagnosis not present

## 2016-06-01 DIAGNOSIS — J449 Chronic obstructive pulmonary disease, unspecified: Secondary | ICD-10-CM | POA: Diagnosis not present

## 2016-06-01 DIAGNOSIS — E785 Hyperlipidemia, unspecified: Secondary | ICD-10-CM | POA: Diagnosis not present

## 2016-06-22 ENCOUNTER — Telehealth: Payer: Self-pay | Admitting: *Deleted

## 2016-06-22 DIAGNOSIS — J810 Acute pulmonary edema: Secondary | ICD-10-CM

## 2016-06-22 DIAGNOSIS — I5033 Acute on chronic diastolic (congestive) heart failure: Secondary | ICD-10-CM

## 2016-06-22 MED ORDER — FUROSEMIDE 20 MG PO TABS: 20.0000 mg | ORAL_TABLET | Freq: Two times a day (BID) | ORAL | 3 refills | Status: DC

## 2016-06-22 NOTE — Addendum Note (Signed)
Addended by: Earnstine Regal on: 06/22/2016 12:45 PM   Modules accepted: Orders

## 2016-06-22 NOTE — Telephone Encounter (Signed)
Son called in stating script went to incorrect pharmacy.  States this script should have went to [facility] in [place].

## 2016-06-22 NOTE — Telephone Encounter (Signed)
Called [facility] spoke w/chris inform him to cancel script, and sent rx to [facility]...Carrie Crawford

## 2016-08-25 ENCOUNTER — Other Ambulatory Visit: Payer: Self-pay | Admitting: Internal Medicine

## 2016-09-01 ENCOUNTER — Ambulatory Visit (INDEPENDENT_AMBULATORY_CARE_PROVIDER_SITE_OTHER)
Admission: RE | Admit: 2016-09-01 | Discharge: 2016-09-01 | Disposition: A | Discharge status: Home / Self Care | Payer: Medicare Other | Source: Ambulatory Visit | Attending: Internal Medicine | Admitting: Internal Medicine

## 2016-09-01 ENCOUNTER — Other Ambulatory Visit: Payer: Medicare Other

## 2016-09-01 DIAGNOSIS — E2839 Other primary ovarian failure: Secondary | ICD-10-CM | POA: Diagnosis not present

## 2016-09-01 DIAGNOSIS — M8000XA Age-related osteoporosis with current pathological fracture, unspecified site, initial encounter for fracture: Secondary | ICD-10-CM

## 2016-09-04 ENCOUNTER — Other Ambulatory Visit: Payer: Self-pay | Admitting: Internal Medicine

## 2016-09-06 DIAGNOSIS — E2839 Other primary ovarian failure: Secondary | ICD-10-CM | POA: Diagnosis not present

## 2016-09-07 ENCOUNTER — Encounter: Payer: Self-pay | Admitting: Internal Medicine

## 2016-09-07 LAB — HM DEXA SCAN: HM Dexa Scan: -1.4

## 2016-10-06 ENCOUNTER — Other Ambulatory Visit: Payer: Self-pay | Admitting: Internal Medicine

## 2016-10-22 ENCOUNTER — Other Ambulatory Visit: Payer: Self-pay | Admitting: Internal Medicine

## 2016-10-22 DIAGNOSIS — I5033 Acute on chronic diastolic (congestive) heart failure: Secondary | ICD-10-CM

## 2016-10-22 DIAGNOSIS — J810 Acute pulmonary edema: Secondary | ICD-10-CM

## 2016-10-27 ENCOUNTER — Encounter: Payer: Self-pay | Admitting: Internal Medicine

## 2016-10-27 ENCOUNTER — Other Ambulatory Visit (INDEPENDENT_AMBULATORY_CARE_PROVIDER_SITE_OTHER): Payer: Medicare Other

## 2016-10-27 ENCOUNTER — Ambulatory Visit (INDEPENDENT_AMBULATORY_CARE_PROVIDER_SITE_OTHER): Payer: Medicare Other | Admitting: Internal Medicine

## 2016-10-27 VITALS — BP 138/70 | HR 80 | Temp 97.5°F | Ht 65.0 in | Wt 178.5 lb

## 2016-10-27 DIAGNOSIS — E785 Hyperlipidemia, unspecified: Secondary | ICD-10-CM | POA: Diagnosis not present

## 2016-10-27 DIAGNOSIS — J310 Chronic rhinitis: Secondary | ICD-10-CM | POA: Diagnosis not present

## 2016-10-27 DIAGNOSIS — I10 Essential (primary) hypertension: Secondary | ICD-10-CM

## 2016-10-27 DIAGNOSIS — I5033 Acute on chronic diastolic (congestive) heart failure: Secondary | ICD-10-CM

## 2016-10-27 LAB — COMPREHENSIVE METABOLIC PANEL
ALT: 15 U/L (ref 0–35)
AST: 19 U/L (ref 0–37)
Albumin: 3.9 g/dL (ref 3.5–5.2)
Alkaline Phosphatase: 78 U/L (ref 39–117)
BUN: 14 mg/dL (ref 6–23)
CALCIUM: 9.3 mg/dL (ref 8.4–10.5)
CHLORIDE: 94 meq/L — AB (ref 96–112)
CO2: 31 meq/L (ref 19–32)
Creatinine, Ser: 0.84 mg/dL (ref 0.40–1.20)
GFR: 67.92 mL/min (ref >60.00)
Glucose, Bld: 111 mg/dL — ABNORMAL HIGH (ref 70–99)
Potassium: 4.3 mEq/L (ref 3.5–5.1)
Sodium: 131 mEq/L — ABNORMAL LOW (ref 135–145)
Total Bilirubin: 0.4 mg/dL (ref 0.2–1.2)
Total Protein: 7.8 g/dL (ref 6.0–8.3)

## 2016-10-27 LAB — LIPID PANEL
CHOL/HDL RATIO: 3
Cholesterol: 184 mg/dL (ref 0–200)
HDL: 56.2 mg/dL (ref >39.00)
LDL Cholesterol: 105 mg/dL — ABNORMAL HIGH (ref 0–99)
NonHDL: 127.49
TRIGLYCERIDES: 112 mg/dL (ref 0.0–149.0)
VLDL: 22.4 mg/dL (ref 0.0–40.0)

## 2016-10-27 LAB — CBC WITH DIFFERENTIAL/PLATELET
BASOS PCT: 1 % (ref 0.0–3.0)
Basophils Absolute: 0.1 10*3/uL (ref 0.0–0.1)
EOS PCT: 2.3 % (ref 0.0–5.0)
Eosinophils Absolute: 0.1 10*3/uL (ref 0.0–0.7)
HEMATOCRIT: 40 % (ref 36.0–46.0)
Hemoglobin: 13.6 g/dL (ref 12.0–15.0)
LYMPHS PCT: 23.5 % (ref 12.0–46.0)
Lymphs Abs: 1.5 10*3/uL (ref 0.7–4.0)
MCHC: 34.1 g/dL (ref 30.0–36.0)
MCV: 92.4 fl (ref 78.0–100.0)
MONOS PCT: 11.8 % (ref 3.0–12.0)
Monocytes Absolute: 0.8 10*3/uL (ref 0.1–1.0)
NEUTROS ABS: 4 10*3/uL (ref 1.4–7.7)
Neutrophils Relative %: 61.4 % (ref 43.0–77.0)
PLATELETS: 324 10*3/uL (ref 150.0–400.0)
RBC: 4.32 Mil/uL (ref 3.87–5.11)
RDW: 13.4 % (ref 11.5–15.5)
WBC: 6.4 10*3/uL (ref 4.0–10.5)

## 2016-10-27 LAB — TSH: TSH: 2.84 u[IU]/mL (ref 0.35–4.50)

## 2016-10-27 MED ORDER — FLUTICASONE PROPIONATE 50 MCG/ACT NA SUSP: NASAL | 3 refills | Status: DC

## 2016-10-27 MED ORDER — AZELASTINE HCL 0.1 % NA SOLN: NASAL | 3 refills | Status: DC

## 2016-10-27 NOTE — Progress Notes (Signed)
Pre visit review using our clinic review tool, if applicable. No additional management support is needed unless otherwise documented below in the visit note. 

## 2016-10-27 NOTE — Progress Notes (Signed)
Subjective:  Patient ID: Carrie Crawford, female    DOB: 05/28/1926  Age: 83 y.o. MRN: [id number]  CC: Hypertension; Hyperlipidemia; and Congestive Heart Failure   HPI FLORESTINE CARMICAL presents for f/up - She complains of nasal allergies with sneezing and congestion and wants a refill on her inhalers. She has lost 11 pounds since I last saw her.  Outpatient Medications Prior to Visit  Medication Sig Dispense Refill  . aspirin 81 MG tablet Take 81 mg by mouth daily.      Marland Kitchen BREO ELLIPTA 100-25 MCG/INH AEPB INHALE 1 PUFF ONCE DAILY AS DIRECTED 90 each 3  . Calcium Carbonate-Vitamin D 600-400 MG-UNIT tablet Take 1 tablet by mouth 2 (two) times daily. 30 tablet 0  . furosemide (LASIX) 20 MG tablet Take 1 tablet (20 mg total) by mouth 2 (two) times daily. 60 tablet 5  . losartan (COZAAR) 100 MG tablet Take 1 tablet (100 mg total) by mouth daily. 90 tablet 3  . tiotropium (SPIRIVA HANDIHALER) 18 MCG inhalation capsule INHALE THE CONTENTS OF ONE CAPSULE ONCE DAILY 90 capsule 1  . azelastine (ASTELIN) 0.1 % nasal spray 1-2 sprays in each nostril at bedtime. 90 mL 0  . fluticasone (FLONASE) 50 MCG/ACT nasal spray 1 TO 2 SPRAYS IN EACH NOSTRIL 1 TO 2 TIMES A DAY 16 g 0  . polyethylene glycol powder (GLYCOLAX/MIRALAX) powder Take 17 g by mouth daily. 3350 g 1  . traMADol (ULTRAM) 50 MG tablet Take 1 tablet (50 mg total) by mouth every 8 (eight) hours as needed. 65 tablet 5  . Multiple Vitamin (MULTIVITAMIN) tablet Take 1 tablet by mouth daily.     No facility-administered medications prior to visit.     ROS Review of Systems  Constitutional: Negative for activity change, chills, diaphoresis, fatigue and unexpected weight change.  HENT: Positive for congestion, postnasal drip and rhinorrhea. Negative for sinus pressure, sore throat and trouble swallowing.   Eyes: Negative for visual disturbance.  Respiratory: Negative for cough, chest tightness, shortness of breath and wheezing.   Cardiovascular:  Negative for chest pain, palpitations and leg swelling.  Gastrointestinal: Negative for abdominal pain, constipation, diarrhea and rectal pain.  Endocrine: Negative.   Genitourinary: Negative.  Negative for difficulty urinating.  Musculoskeletal: Negative.  Negative for arthralgias, back pain, myalgias and neck pain.  Skin: Negative.   Allergic/Immunologic: Negative.   Neurological: Negative.  Negative for dizziness, weakness and numbness.  Hematological: Negative for adenopathy. Does not bruise/bleed easily.  Psychiatric/Behavioral: Negative.     Objective:  BP 138/70 (BP Location: Left Arm, Patient Position: Sitting, Cuff Size: Large)   Pulse 80   Temp 97.5 F (36.4 C) (Oral)   Ht '5\' 5"'$  (1.651 m)   Wt 178 lb 8 oz (81 kg)   SpO2 97%   BMI 29.70 kg/m   BP Readings from Last 3 Encounters:  10/27/16 138/70  04/23/16 126/74  04/13/16 136/72    Wt Readings from Last 3 Encounters:  10/27/16 178 lb 8 oz (81 kg)  04/23/16 189 lb (85.7 kg)  04/13/16 192 lb (87.1 kg)    Physical Exam  Constitutional: She is oriented to person, place, and time. No distress.  HENT:  Mouth/Throat: Oropharynx is clear and moist. No oropharyngeal exudate.  Eyes: Conjunctivae are normal. Right eye exhibits no discharge. Left eye exhibits no discharge. No scleral icterus.  Neck: Normal range of motion. Neck supple. No JVD present. No tracheal deviation present. No thyromegaly present.  Cardiovascular: Normal rate, regular  rhythm, normal heart sounds and intact distal pulses.  Exam reveals no gallop and no friction rub.   No murmur heard. Pulmonary/Chest: Effort normal and breath sounds normal. No stridor. No respiratory distress. She has no wheezes. She has no rales. She exhibits no tenderness.  Abdominal: Soft. Bowel sounds are normal. She exhibits no distension and no mass. There is no tenderness. There is no rebound and no guarding.  Musculoskeletal: Normal range of motion. She exhibits no edema,  tenderness or deformity.  Lymphadenopathy:    She has no cervical adenopathy.  Neurological: She is oriented to person, place, and time.  Skin: Skin is warm and dry. No rash noted. She is not diaphoretic. No erythema. No pallor.  Vitals reviewed.   Lab Results  Component Value Date   WBC 6.4 10/27/2016   HGB 13.6 10/27/2016   HCT 40.0 10/27/2016   PLT 324.0 10/27/2016   GLUCOSE 111 (H) 10/27/2016   CHOL 184 10/27/2016   TRIG 112.0 10/27/2016   HDL 56.20 10/27/2016   LDLCALC 105 (H) 10/27/2016   ALT 15 10/27/2016   AST 19 10/27/2016   NA 131 (L) 10/27/2016   K 4.3 10/27/2016   CL 94 (L) 10/27/2016   CREATININE 0.84 10/27/2016   BUN 14 10/27/2016   CO2 31 10/27/2016   TSH 2.84 10/27/2016    Dg Bone Density  Result Date: 09/06/2016 Date of study: 09/01/16 Exam: DUAL X-RAY ABSORPTIOMETRY (DXA) FOR BONE MINERAL DENSITY (BMD) Instrument: [employer] Provider: PCP Indication: follow up for low BMD Comparison: none (please note that it is not possible to compare data from different instruments) Clinical data: Pt is a 83 y.o. female with previous history of fracture. On calcium and vitamin D. Results:  Lumbar spine (L1-L4) Femoral neck (FN) 33% distal radius T-score -1.4 RFN:-1.2 LFN:-1.2 n/a Change in BMD from previous DXA test (%) n/a n/a n/a (*) statistically significant Assessment: the BMD is low according to the [facility] classification for osteoporosis (see below). Fracture risk: moderate FRAX score: 10 year major osteoporotic risk: 14.9%. 10 year hip fracture risk: 3.6%. These are not under the thresholds for treatment of 20% and 3%, respectively. Comments: the technical quality of the study is good. Scoliosis and degenerative change may falsely elevated spine BMD. Evaluation for secondary causes should be considered if clinically indicated. Recommend optimizing calcium (1200 mg/day) and vitamin D (800 IU/day) intake. Followup: Repeat BMD is appropriate after 2 years or  after 1-2 years if starting treatment. WHO criteria for diagnosis of osteoporosis in postmenopausal women and in men 48 y/o or older: - normal: T-score -1.0 to + 1.0 - osteopenia/low bone density: T-score between -2.5 and -1.0 - osteoporosis: T-score below -2.5 - severe osteoporosis: T-score below -2.5 with history of fragility fracture Note: although not part of the WHO classification, the presence of a fragility fracture, regardless of the T-score, should be considered diagnostic of osteoporosis, provided other causes for the fracture have been excluded. Treatment: The National Osteoporosis Foundation recommends that treatment be considered in postmenopausal women and men age 48 or older with: 1. Hip or vertebral (clinical or morphometric) fracture 2. T-score of - 2.5 or lower at the spine or hip 3. 10-year fracture probability by FRAX of at least 20% for a major osteoporotic fracture and 3% for a hip fracture Loura Pardon MD    Assessment & Plan:   Shanon was seen today for hypertension, hyperlipidemia and congestive heart failure.  Diagnoses and all orders for this visit:  Diastolic dysfunction with acute on chronic heart failure ([place])- she is euvolemic, will continue diuresis with furosemide, her electrolytes and renal function are normal -     Comprehensive metabolic panel; Future  Essential hypertension- her blood pressure is adequately well controlled. -     Comprehensive metabolic panel; Future -     CBC with Differential/Platelet; Future  Hyperlipidemia with target LDL less than 130- She has achieved her LDL goal and is not interested in taking a statin for cardiovascular risk reduction -     Lipid panel; Future -     Comprehensive metabolic panel; Future -     TSH; Future  Rhinitis, chronic -     fluticasone (FLONASE) 50 MCG/ACT nasal spray; 2 SPRAYS IN EACH NOSTRIL QD -     azelastine (ASTELIN) 0.1 % nasal spray; 2 sprays in each nostril at bedtime.   I have discontinued Ms.  Kirchman multivitamin, polyethylene glycol powder, and traMADol. I have also changed her fluticasone and azelastine. Additionally, I am having her maintain her aspirin, losartan, Calcium Carbonate-Vitamin D, tiotropium, BREO ELLIPTA, and furosemide.  Meds ordered this encounter  Medications  . fluticasone (FLONASE) 50 MCG/ACT nasal spray    Sig: 2 SPRAYS IN EACH NOSTRIL QD    Dispense:  48 g    Refill:  3  . azelastine (ASTELIN) 0.1 % nasal spray    Sig: 2 sprays in each nostril at bedtime.    Dispense:  90 mL    Refill:  3    90 day supply     Follow-up: Return in about 6 months (around 04/28/2017).  Scarlette Calico, MD

## 2016-10-27 NOTE — Patient Instructions (Signed)
Heart Failure °Heart failure is a condition in which the heart has trouble pumping blood because it has become weak or stiff. This means that the heart does not pump blood efficiently for the body to work well. For some people with heart failure, fluid may back up into the lungs and there may be swelling (edema) in the lower legs. Heart failure is usually a long-term (chronic) condition. It is important for you to take good care of yourself and follow the treatment plan from your health care provider. °What are the causes? °This condition is caused by some health problems, including: °· High blood pressure (hypertension). Hypertension causes the heart muscle to work harder than normal. High blood pressure eventually causes the heart to become stiff and weak. °· Coronary artery disease (CAD). CAD is the buildup of cholesterol and fat (plaques) in the arteries of the heart. °· Heart attack (myocardial infarction). Injured tissue, which is caused by the heart attack, does not contract as well and the heart's ability to pump blood is weakened. °· Abnormal heart valves. When the heart valves do not open and close properly, the heart muscle must pump harder to keep the blood flowing. °· Heart muscle disease (cardiomyopathy or myocarditis). Heart muscle disease is damage to the heart muscle from a variety of causes, such as drug or alcohol abuse, infections, or unknown causes. These can increase the risk of heart failure. °· Lung disease. When the lungs do not work properly, the heart must work harder. ° °What increases the risk? °Risk of heart failure increases as a person ages. This condition is also more likely to develop in people who: °· Are overweight. °· Are female. °· Smoke or chew tobacco. °· Abuse alcohol or illegal drugs. °· Have taken medicines that can damage the heart, such as chemotherapy drugs. °· Have diabetes. °? High blood sugar (glucose) is associated with high fat (lipid) levels in the blood. °? Diabetes  can also damage tiny blood vessels that carry nutrients to the heart muscle. °· Have abnormal heart rhythms. °· Have thyroid problems. °· Have low blood counts (anemia). ° °What are the signs or symptoms? °Symptoms of this condition include: °· Shortness of breath with activity, such as when climbing stairs. °· Persistent cough. °· Swelling of the feet, ankles, legs, or abdomen. °· Unexplained weight gain. °· Difficulty breathing when lying flat (orthopnea). °· Waking from sleep because of the need to sit up and get more air. °· Rapid heartbeat. °· Fatigue and loss of energy. °· Feeling light-headed, dizzy, or close to fainting. °· Loss of appetite. °· Nausea. °· Increased urination during the night (nocturia). °· Confusion. ° °How is this diagnosed? °This condition is diagnosed based on: °· Medical history, symptoms, and a physical exam. °· Diagnostic tests, which may include: °? Echocardiogram. °? Electrocardiogram (ECG). °? Chest X-ray. °? Blood tests. °? Exercise stress test. °? Radionuclide scans. °? Cardiac catheterization and angiogram. ° °How is this treated? °Treatment for this condition is aimed at managing the symptoms of heart failure. Medicines, behavioral changes, or other treatments may be necessary to treat heart failure. °Medicines °These may include: °· Angiotensin-converting enzyme (ACE) inhibitors. This type of medicine blocks the effects of a blood protein called angiotensin-converting enzyme. ACE inhibitors relax (dilate) the blood vessels and help to lower blood pressure. °· Angiotensin receptor blockers (ARBs). This type of medicine blocks the actions of a blood protein called angiotensin. ARBs dilate the blood vessels and help to lower blood pressure. °· Water   pills (diuretics). Diuretics cause the kidneys to remove salt and water from the blood. The extra fluid is removed through urination, leaving a lower volume of blood that the heart has to pump. °· Beta blockers. These improve heart  muscle strength and they prevent the heart from beating too quickly. °· Digoxin. This increases the force of the heartbeat. ° °Healthy behavior changes °These may include: °· Reaching and maintaining a healthy weight. °· Stopping smoking or chewing tobacco. °· Eating heart-healthy foods. °· Limiting or avoiding alcohol. °· Stopping use of street drugs (illegal drugs). °· Physical activity. ° °Other treatments °These may include: °· Surgery to open blocked coronary arteries or repair damaged heart valves. °· Placement of a biventricular pacemaker to improve heart muscle function (cardiac resynchronization therapy). This device paces both the right ventricle and left ventricle. °· Placement of a device to treat serious abnormal heart rhythms (implantable cardioverter defibrillator, or ICD). °· Placement of a device to improve the pumping ability of the heart (left ventricular assist device, or LVAD). °· Heart transplant. This can cure heart failure, and it is considered for certain patients who do not improve with other therapies. ° °Follow these instructions at home: °Medicines °· Take over-the-counter and prescription medicines only as told by your health care provider. Medicines are important in reducing the workload of your heart, slowing the progression of heart failure, and improving your symptoms. °? Do not stop taking your medicine unless your health care provider told you to do that. °? Do not skip any dose of medicine. °? Refill your prescriptions before you run out of medicine. You need your medicines every day. °Eating and drinking ° °· Eat heart-healthy foods. Talk with a dietitian to make an eating plan that is right for you. °? Choose foods that contain no trans fat and are low in saturated fat and cholesterol. Healthy choices include fresh or frozen fruits and vegetables, fish, lean meats, legumes, fat-free or low-fat dairy products, and whole-grain or high-fiber foods. °? Limit salt (sodium) if  directed by your health care provider. Sodium restriction may reduce symptoms of heart failure. Ask a dietitian to recommend heart-healthy seasonings. °? Use healthy cooking methods instead of frying. Healthy methods include roasting, grilling, broiling, baking, poaching, steaming, and stir-frying. °· Limit your fluid intake if directed by your health care provider. Fluid restriction may reduce symptoms of heart failure. °Lifestyle °· Stop smoking or using chewing tobacco. Nicotine and tobacco can damage your heart and your blood vessels. Do not use nicotine gum or patches before talking to your health care provider. °· Limit alcohol intake to no more than 1 drink per day for non-pregnant women and 2 drinks per day for men. One drink equals 12 oz of beer, 5 oz of wine, or 1½ oz of hard liquor. °? Drinking more than that is harmful to your heart. Tell your health care provider if you drink alcohol several times a week. °? Talk with your health care provider about whether any level of alcohol use is safe for you. °? If your heart has already been damaged by alcohol or you have severe heart failure, drinking alcohol should be stopped completely. °· Stop use of illegal drugs. °· Lose weight if directed by your health care provider. Weight loss may reduce symptoms of heart failure. °· Do moderate physical activity if directed by your health care provider. People who are elderly and people with severe heart failure should consult with a health care provider for physical activity recommendations. °  Monitor important information °· Weigh yourself every day. Keeping track of your weight daily helps you to notice excess fluid sooner. °? Weigh yourself every morning after you urinate and before you eat breakfast. °? Wear the same amount of clothing each time you weigh yourself. °? Record your daily weight. Provide your health care provider with your weight record. °· Monitor and record your blood pressure as told by your health  care provider. °· Check your pulse as told by your health care provider. °Dealing with extreme temperatures °· If the weather is extremely hot: °? Avoid vigorous physical activity. °? Use air conditioning or fans or seek a cooler location. °? Avoid caffeine and alcohol. °? Wear loose-fitting, lightweight, and light-colored clothing. °· If the weather is extremely cold: °? Avoid vigorous physical activity. °? Layer your clothes. °? Wear mittens or gloves, a hat, and a scarf when you go outside. °? Avoid alcohol. °General instructions °· Manage other health conditions such as hypertension, diabetes, thyroid disease, or abnormal heart rhythms as told by your health care provider. °· Learn to manage stress. If you need help to do this, ask your health care provider. °· Plan rest periods when fatigued. °· Get ongoing education and support as needed. °· Participate in or seek rehabilitation as needed to maintain or improve independence and quality of life. °· Stay up to date with immunizations. Keeping current on pneumococcal and influenza immunizations is especially important to prevent respiratory infections. °· Keep all follow-up visits as told by your health care provider. This is important. °Contact a health care provider if: °· You have a rapid weight gain. °· You have increasing shortness of breath that is unusual for you. °· You are unable to participate in your usual physical activities. °· You tire easily. °· You cough more than normal, especially with physical activity. °· You have any swelling or more swelling in areas such as your hands, feet, ankles, or abdomen. °· You are unable to sleep because it is hard to breathe. °· You feel like your heart is beating quickly (palpitations). °· You become dizzy or light-headed when you stand up. °Get help right away if: °· You have difficulty breathing. °· You notice or your family notices a change in your awareness, such as having trouble staying awake or having  difficulty with concentration. °· You have pain or discomfort in your chest. °· You have an episode of fainting (syncope). °This information is not intended to replace advice given to you by your health care provider. Make sure you discuss any questions you have with your health care provider. °Document Released: 06/29/2005 Document Revised: 03/03/2016 Document Reviewed: 01/22/2016 °Elsevier Interactive Patient Education © 2017 [employer] ° °

## 2016-11-16 ENCOUNTER — Other Ambulatory Visit: Payer: Self-pay | Admitting: Internal Medicine

## 2017-02-02 ENCOUNTER — Ambulatory Visit: Payer: Medicare Other | Admitting: Internal Medicine

## 2017-02-09 ENCOUNTER — Ambulatory Visit: Payer: Medicare Other | Admitting: Internal Medicine

## 2017-02-16 ENCOUNTER — Ambulatory Visit (INDEPENDENT_AMBULATORY_CARE_PROVIDER_SITE_OTHER): Payer: Medicare Other | Admitting: Internal Medicine

## 2017-02-16 ENCOUNTER — Encounter: Payer: Self-pay | Admitting: Internal Medicine

## 2017-02-16 VITALS — BP 118/48 | HR 88 | Temp 99.3°F | Resp 16 | Ht 65.0 in | Wt 184.0 lb

## 2017-02-16 DIAGNOSIS — I5033 Acute on chronic diastolic (congestive) heart failure: Secondary | ICD-10-CM | POA: Diagnosis not present

## 2017-02-16 DIAGNOSIS — E669 Obesity, unspecified: Secondary | ICD-10-CM | POA: Diagnosis not present

## 2017-02-16 DIAGNOSIS — I10 Essential (primary) hypertension: Secondary | ICD-10-CM

## 2017-02-16 DIAGNOSIS — N3281 Overactive bladder: Secondary | ICD-10-CM | POA: Diagnosis not present

## 2017-02-16 MED ORDER — FESOTERODINE FUMARATE ER 8 MG PO TB24: 8.0000 mg | ORAL_TABLET | Freq: Every day | ORAL | 1 refills | Status: DC

## 2017-02-16 MED ORDER — LOSARTAN POTASSIUM 100 MG PO TABS: 100.0000 mg | ORAL_TABLET | Freq: Every day | ORAL | 3 refills | Status: DC

## 2017-02-16 NOTE — Progress Notes (Addendum)
Subjective:  Patient ID: Carrie Crawford, female    DOB: July 07, 1926  Age: 83 y.o. MRN: [id number]  CC: Urinary Frequency   HPI [name] presents for the complaint of worsening urinary frequency, nocturia, a few episodes of incontinence, and urgency.  She also complains of worsening knee pain and lower extremity weakness and needs a toilet chair and a wheelchair.  Outpatient Medications Prior to Visit  Medication Sig Dispense Refill  . aspirin 81 MG tablet Take 81 mg by mouth daily.      Marland Kitchen azelastine (ASTELIN) 0.1 % nasal spray 2 sprays in each nostril at bedtime. 90 mL 3  . BREO ELLIPTA 100-25 MCG/INH AEPB INHALE 1 PUFF ONCE DAILY AS DIRECTED 90 each 3  . Calcium Carbonate-Vitamin D 600-400 MG-UNIT tablet Take 1 tablet by mouth 2 (two) times daily. 30 tablet 0  . fluticasone (FLONASE) 50 MCG/ACT nasal spray 2 SPRAYS IN EACH NOSTRIL QD 48 g 3  . furosemide (LASIX) 20 MG tablet Take 1 tablet (20 mg total) by mouth 2 (two) times daily. 60 tablet 5  . SPIRIVA HANDIHALER 18 MCG inhalation capsule INHALE THE CONTENTS OF ONE CAPSULE ONCE DAILY 30 capsule 11  . losartan (COZAAR) 100 MG tablet Take 1 tablet (100 mg total) by mouth daily. 90 tablet 3   No facility-administered medications prior to visit.     ROS Review of Systems  Constitutional: Negative.  Negative for chills, diaphoresis, fatigue and fever.  HENT: Negative.   Eyes: Negative.  Negative for visual disturbance.  Respiratory: Negative.  Negative for cough, chest tightness, shortness of breath and wheezing.   Cardiovascular: Negative for chest pain, palpitations and leg swelling.  Gastrointestinal: Negative for abdominal pain, constipation, diarrhea and nausea.  Endocrine: Negative.   Genitourinary: Positive for difficulty urinating, enuresis, frequency and urgency. Negative for decreased urine volume, dysuria, flank pain, hematuria and pelvic pain.  Musculoskeletal: Positive for arthralgias. Negative for back pain,  myalgias and neck pain.  Skin: Negative.  Negative for color change and rash.  Allergic/Immunologic: Negative.   Neurological: Negative.  Negative for dizziness, weakness and light-headedness.  Hematological: Negative for adenopathy. Does not bruise/bleed easily.  Psychiatric/Behavioral: Negative.     Objective:  BP (!) 118/48 (BP Location: Left Arm, Patient Position: Sitting, Cuff Size: Large)   Pulse 88   Temp 99.3 F (37.4 C) (Oral)   Resp 16   Ht 5\' 5"  (1.651 m)   Wt 184 lb (83.5 kg)   SpO2 96%   BMI 30.62 kg/m   BP Readings from Last 3 Encounters:  02/16/17 (!) 118/48  10/27/16 138/70  04/23/16 126/74    Wt Readings from Last 3 Encounters:  02/16/17 184 lb (83.5 kg)  10/27/16 178 lb 8 oz (81 kg)  04/23/16 189 lb (85.7 kg)    Physical Exam  Constitutional: She is oriented to person, place, and time. No distress.  Wheel chair bound  HENT:  Mouth/Throat: Oropharynx is clear and moist. No oropharyngeal exudate.  Eyes: Conjunctivae are normal. Right eye exhibits no discharge. Left eye exhibits no discharge. No scleral icterus.  Neck: Normal range of motion. Neck supple. No JVD present. No thyromegaly present.  Cardiovascular: Normal rate, regular rhythm and intact distal pulses.  Exam reveals no gallop and no friction rub.   No murmur heard. Pulmonary/Chest: Effort normal and breath sounds normal. No respiratory distress. She has no wheezes. She has no rales. She exhibits no tenderness.  Abdominal: Soft. Bowel sounds are normal. She exhibits  no distension and no mass. There is no tenderness. There is no rebound and no guarding.  Musculoskeletal: Normal range of motion. She exhibits no edema, tenderness or deformity.  Lymphadenopathy:    She has no cervical adenopathy.  Neurological: She is alert and oriented to person, place, and time.  Skin: Skin is warm and dry. No rash noted. She is not diaphoretic. No pallor.  Vitals reviewed.   Lab Results  Component Value  Date   WBC 6.4 10/27/2016   HGB 13.6 10/27/2016   HCT 40.0 10/27/2016   PLT 324.0 10/27/2016   GLUCOSE 111 (H) 10/27/2016   CHOL 184 10/27/2016   TRIG 112.0 10/27/2016   HDL 56.20 10/27/2016   LDLCALC 105 (H) 10/27/2016   ALT 15 10/27/2016   AST 19 10/27/2016   NA 131 (L) 10/27/2016   K 4.3 10/27/2016   CL 94 (L) 10/27/2016   CREATININE 0.84 10/27/2016   BUN 14 10/27/2016   CO2 31 10/27/2016   TSH 2.84 10/27/2016    Dg Bone Density  Result Date: 09/06/2016 Date of study: 09/01/16 Exam: DUAL X-RAY ABSORPTIOMETRY (DXA) FOR BONE MINERAL DENSITY (BMD) Instrument: [employer] Provider: PCP Indication: follow up for low BMD Comparison: none (please note that it is not possible to compare data from different instruments) Clinical data: Pt is a 83 y.o. female with previous history of fracture. On calcium and vitamin D. Results:  Lumbar spine (L1-L4) Femoral neck (FN) 33% distal radius T-score -1.4 RFN:-1.2 LFN:-1.2 n/a Change in BMD from previous DXA test (%) n/a n/a n/a (*) statistically significant Assessment: the BMD is low according to the [facility] classification for osteoporosis (see below). Fracture risk: moderate FRAX score: 10 year major osteoporotic risk: 14.9%. 10 year hip fracture risk: 3.6%. These are not under the thresholds for treatment of 20% and 3%, respectively. Comments: the technical quality of the study is good. Scoliosis and degenerative change may falsely elevated spine BMD. Evaluation for secondary causes should be considered if clinically indicated. Recommend optimizing calcium (1200 mg/day) and vitamin D (800 IU/day) intake. Followup: Repeat BMD is appropriate after 2 years or after 1-2 years if starting treatment. WHO criteria for diagnosis of osteoporosis in postmenopausal women and in men 28 y/o or older: - normal: T-score -1.0 to + 1.0 - osteopenia/low bone density: T-score between -2.5 and -1.0 - osteoporosis: T-score below -2.5 - severe osteoporosis:  T-score below -2.5 with history of fragility fracture Note: although not part of the WHO classification, the presence of a fragility fracture, regardless of the T-score, should be considered diagnostic of osteoporosis, provided other causes for the fracture have been excluded. Treatment: The National Osteoporosis Foundation recommends that treatment be considered in postmenopausal women and men age 99 or older with: 1. Hip or vertebral (clinical or morphometric) fracture 2. T-score of - 2.5 or lower at the spine or hip 3. 10-year fracture probability by FRAX of at least 20% for a major osteoporotic fracture and 3% for a hip fracture Loura Pardon MD    Assessment & Plan:   Anaiyah was seen today for urinary frequency.  Diagnoses and all orders for this visit:  OAB (overactive bladder) -     fesoterodine (TOVIAZ) 8 MG TB24 tablet; Take 1 tablet (8 mg total) by mouth daily.  Essential hypertension- her blood pressures adequately well-controlled  Diastolic dysfunction with acute on chronic heart failure ([place])- she has a normal volume status today, will cont the loop diuretic and ARB  Obesity (BMI 30-39.9)-  she agrees to decrease her caloric intake to lose weight.  Other orders -     losartan (COZAAR) 100 MG tablet; Take 1 tablet (100 mg total) by mouth daily. -     Ambulatory referral to [place] -     Elevated toilet seat -     DME Wheelchair manual     Durable Medical Equipment        Start     Ordered   02/17/17 0000  DME Wheelchair manual    Comments:  Patient suffers from CHF which impairs their ability to perform daily activities like bathing, dressing, feeding and grooming in the home.  A walker will not resolve  issue with performing activities of daily living. A wheelchair will allow patient to safely perform daily activities. Patient can safely propel the wheelchair in the home or has a caregiver who can provide assistance.  Accessories: elevating leg rests (ELRs), wheel  locks, extensions and anti-tippers.   02/17/17 1805       I am having Ms. Fritts start on fesoterodine. I am also having her maintain her aspirin, Calcium Carbonate-Vitamin D, BREO ELLIPTA, furosemide, fluticasone, azelastine, SPIRIVA HANDIHALER, dextromethorphan-guaiFENesin, loratadine, and losartan.  Meds ordered this encounter  Medications  . dextromethorphan-guaiFENesin (MUCINEX DM) 30-600 MG 12hr tablet    Sig: Take 1 tablet by mouth 2 (two) times daily.  Marland Kitchen loratadine (CLARITIN) 10 MG tablet    Sig: Take 10 mg by mouth daily.  Marland Kitchen losartan (COZAAR) 100 MG tablet    Sig: Take 1 tablet (100 mg total) by mouth daily.    Dispense:  90 tablet    Refill:  3  . fesoterodine (TOVIAZ) 8 MG TB24 tablet    Sig: Take 1 tablet (8 mg total) by mouth daily.    Dispense:  90 tablet    Refill:  1     Follow-up: Return in about 3 months (around 05/19/2017).  Scarlette Calico, MD

## 2017-02-16 NOTE — Patient Instructions (Signed)

## 2017-02-17 DIAGNOSIS — E669 Obesity, unspecified: Secondary | ICD-10-CM | POA: Insufficient documentation

## 2017-02-22 ENCOUNTER — Telehealth: Payer: Self-pay | Admitting: Internal Medicine

## 2017-02-22 NOTE — Telephone Encounter (Signed)
Physical therapists called for verbals for 3week2 and 2week2 Please advise

## 2017-02-23 NOTE — Telephone Encounter (Signed)
Called Carrie Crawford no answer LMOM w/MD response...Carrie Crawford

## 2017-02-23 NOTE — Telephone Encounter (Signed)
yes

## 2017-03-01 DIAGNOSIS — I110 Hypertensive heart disease with heart failure: Secondary | ICD-10-CM

## 2017-03-01 DIAGNOSIS — E785 Hyperlipidemia, unspecified: Secondary | ICD-10-CM

## 2017-03-01 DIAGNOSIS — M810 Age-related osteoporosis without current pathological fracture: Secondary | ICD-10-CM | POA: Diagnosis not present

## 2017-03-01 DIAGNOSIS — Z6830 Body mass index (BMI) 30.0-30.9, adult: Secondary | ICD-10-CM | POA: Diagnosis not present

## 2017-03-01 DIAGNOSIS — I5033 Acute on chronic diastolic (congestive) heart failure: Secondary | ICD-10-CM | POA: Diagnosis not present

## 2017-03-01 DIAGNOSIS — N3281 Overactive bladder: Secondary | ICD-10-CM | POA: Diagnosis not present

## 2017-03-01 DIAGNOSIS — Z7982 Long term (current) use of aspirin: Secondary | ICD-10-CM | POA: Diagnosis not present

## 2017-03-01 DIAGNOSIS — E669 Obesity, unspecified: Secondary | ICD-10-CM | POA: Diagnosis not present

## 2017-03-01 DIAGNOSIS — J449 Chronic obstructive pulmonary disease, unspecified: Secondary | ICD-10-CM | POA: Diagnosis not present

## 2017-04-17 ENCOUNTER — Other Ambulatory Visit: Payer: Self-pay | Admitting: Internal Medicine

## 2017-04-17 DIAGNOSIS — J810 Acute pulmonary edema: Secondary | ICD-10-CM

## 2017-04-17 DIAGNOSIS — I5033 Acute on chronic diastolic (congestive) heart failure: Secondary | ICD-10-CM

## 2017-04-28 NOTE — Progress Notes (Deleted)
Pre visit review using our clinic review tool, if applicable. No additional management support is needed unless otherwise documented below in the visit note. 

## 2017-04-28 NOTE — Progress Notes (Deleted)
Subjective:   Carrie Crawford is a 83 y.o. female who presents for Medicare Annual (Subsequent) preventive examination.  Review of Systems:  No ROS.  Medicare Wellness Visit. Additional risk factors are reflected in the social history.    Sleep patterns: feels rested on waking, gets up *** times nightly to void and sleeps *** hours nightly.    Home Safety/Smoke Alarms: Feels safe in home. Smoke alarms in place.  Living environment; residence and Firearm Safety: {Rehab home environment / accessibility:30080::"no firearms","firearms stored safely"}. Seat Belt Safety/Bike Helmet: Wears seat belt.      Objective:     Vitals: There were no vitals taken for this visit.  There is no height or weight on file to calculate BMI.   Tobacco History  Smoking Status  . Never Smoker  Smokeless Tobacco  . Never Used     Counseling given: Not Answered   Past Medical History:  Diagnosis Date  . COPD (chronic obstructive pulmonary disease) (HCC)    chronic bronchitis  . Dyslipidemia   . Hypertension   . Multiple allergies   . Osteopenia   . Rhinitis, chronic    Past Surgical History:  Procedure Laterality Date  . CHOLECYSTECTOMY    . THYROIDECTOMY, PARTIAL  2004   [facility]   Family History  Problem Relation Age of Onset  . Heart disease Brother   . Cancer Daughter        endometial/uterine cancer(nodule in right lung)   History  Sexual Activity  . Sexual activity: Not on file    Outpatient Encounter Prescriptions as of 04/29/2017  Medication Sig  . aspirin 81 MG tablet Take 81 mg by mouth daily.    Marland Kitchen azelastine (ASTELIN) 0.1 % nasal spray 2 sprays in each nostril at bedtime.  Marland Kitchen BREO ELLIPTA 100-25 MCG/INH AEPB INHALE 1 PUFF ONCE DAILY AS DIRECTED  . Calcium Carbonate-Vitamin D 600-400 MG-UNIT tablet Take 1 tablet by mouth 2 (two) times daily.  Marland Kitchen dextromethorphan-guaiFENesin (MUCINEX DM) 30-600 MG 12hr tablet Take 1 tablet by mouth 2 (two) times daily.  . fesoterodine  (TOVIAZ) 8 MG TB24 tablet Take 1 tablet (8 mg total) by mouth daily.  . fluticasone (FLONASE) 50 MCG/ACT nasal spray 2 SPRAYS IN EACH NOSTRIL QD  . furosemide (LASIX) 20 MG tablet Take 1 tablet (20 mg total) by mouth 2 (two) times daily.  Marland Kitchen loratadine (CLARITIN) 10 MG tablet Take 10 mg by mouth daily.  Marland Kitchen losartan (COZAAR) 100 MG tablet Take 1 tablet (100 mg total) by mouth daily.  Marland Kitchen SPIRIVA HANDIHALER 18 MCG inhalation capsule INHALE THE CONTENTS OF ONE CAPSULE ONCE DAILY   No facility-administered encounter medications on file as of 04/29/2017.     Activities of Daily Living No flowsheet data found.  Patient Care Team: Janith Lima, MD as PCP - General (Internal Medicine) Deneise Lever, MD (Pulmonary Disease)    Assessment:    Physical assessment deferred to PCP.  Exercise Activities and Dietary recommendations   Diet (meal preparation, eat out, water intake, caffeinated beverages, dairy products, fruits and vegetables): {Desc; diets:16563}    Goals      Patient Stated   . patient (pt-stated)          Will attempt sitter exercise; does a few every day       Fall Risk Fall Risk  02/18/2017 10/23/2015 04/23/2015  Falls in the past year? No No Yes  Number falls in past yr: - - 2 or more  Injury  with Fall? - - No  Follow up - - Education provided  Comment - - getting up going to the bathroom at hs   Depression Screen PHQ 2/9 Scores 02/18/2017 10/23/2015 04/23/2015  PHQ - 2 Score 0 0 0     Cognitive Function MMSE - Mini Mental State Exam 04/23/2015  Orientation to time 3  Orientation to Place 5  Registration 3  Attention/ Calculation 3  Recall 0  Language- name 2 objects 2  Language- repeat 1  Language- follow 3 step command 3  Language- read & follow direction 1  Write a sentence 1  Copy design 0  Total score 22        Immunization History  Administered Date(s) Administered  . Influenza, High Dose Seasonal PF 04/23/2016  . Influenza,inj,Quad PF,6+ Mos  04/26/2013, 04/19/2014, 04/23/2015  . Pneumococcal Conjugate-13 05/25/2014  . Pneumococcal Polysaccharide-23 03/14/1999, 04/23/2015  . Tdap 04/23/2015  . Zoster 03/25/2011   Screening Tests Health Maintenance  Topic Date Due  . INFLUENZA VACCINE  02/10/2017  . TETANUS/TDAP  04/22/2025  . DEXA SCAN  Completed  . PNA vac Low Risk Adult  Completed      Plan:    I have personally reviewed and noted the following in the patient's chart:   . Medical and social history . Use of alcohol, tobacco or illicit drugs  . Current medications and supplements . Functional ability and status . Nutritional status . Physical activity . Advanced directives . List of other physicians . Vitals . Screenings to include cognitive, depression, and falls . Referrals and appointments  In addition, I have reviewed and discussed with patient certain preventive protocols, quality metrics, and best practice recommendations. A written personalized care plan for preventive services as well as general preventive health recommendations were provided to patient.     Michiel Cowboy, RN  04/28/2017

## 2017-04-29 ENCOUNTER — Ambulatory Visit (INDEPENDENT_AMBULATORY_CARE_PROVIDER_SITE_OTHER): Payer: Medicare Other | Admitting: Internal Medicine

## 2017-04-29 ENCOUNTER — Encounter: Payer: Self-pay | Admitting: Internal Medicine

## 2017-04-29 ENCOUNTER — Ambulatory Visit: Payer: Medicare Other

## 2017-04-29 ENCOUNTER — Other Ambulatory Visit (INDEPENDENT_AMBULATORY_CARE_PROVIDER_SITE_OTHER): Payer: Medicare Other

## 2017-04-29 VITALS — BP 136/74 | HR 74 | Resp 20 | Ht 65.0 in | Wt 182.0 lb

## 2017-04-29 VITALS — BP 122/50 | HR 72 | Temp 97.9°F | Resp 16 | Ht 65.0 in | Wt 185.0 lb

## 2017-04-29 DIAGNOSIS — Z23 Encounter for immunization: Secondary | ICD-10-CM

## 2017-04-29 DIAGNOSIS — I10 Essential (primary) hypertension: Secondary | ICD-10-CM

## 2017-04-29 DIAGNOSIS — E669 Obesity, unspecified: Secondary | ICD-10-CM

## 2017-04-29 DIAGNOSIS — R739 Hyperglycemia, unspecified: Secondary | ICD-10-CM

## 2017-04-29 DIAGNOSIS — I5033 Acute on chronic diastolic (congestive) heart failure: Secondary | ICD-10-CM | POA: Diagnosis not present

## 2017-04-29 DIAGNOSIS — Z0000 Encounter for general adult medical examination without abnormal findings: Secondary | ICD-10-CM

## 2017-04-29 LAB — BASIC METABOLIC PANEL
BUN: 11 mg/dL (ref 6–23)
CALCIUM: 9 mg/dL (ref 8.4–10.5)
CO2: 33 meq/L — AB (ref 19–32)
CREATININE: 0.76 mg/dL (ref 0.40–1.20)
Chloride: 92 mEq/L — ABNORMAL LOW (ref 96–112)
GFR: 76.15 mL/min (ref >60.00)
GLUCOSE: 103 mg/dL — AB (ref 70–99)
Potassium: 4.4 mEq/L (ref 3.5–5.1)
SODIUM: 129 meq/L — AB (ref 135–145)

## 2017-04-29 LAB — HEMOGLOBIN A1C: Hgb A1c MFr Bld: 5.8 % (ref 4.6–6.5)

## 2017-04-29 NOTE — Progress Notes (Signed)
Subjective:  Patient ID: Carrie Crawford, female    DOB: 09/24/1925  Age: 83 y.o. MRN: [id number]  CC: Medicare Wellness; Congestive Heart Failure; and Hypertension   HPI SALINDA SNEDEKER presents for f/up - She offers no new complaints today. She spends most of her time in a wheelchair but her son tells me that when she gets up and takes a few steps she does experience some DOE. She's had no recent episodes of chest pain or edema.  Outpatient Medications Prior to Visit  Medication Sig Dispense Refill  . aspirin 81 MG tablet Take 81 mg by mouth daily.      Marland Kitchen azelastine (ASTELIN) 0.1 % nasal spray 2 sprays in each nostril at bedtime. 90 mL 3  . BREO ELLIPTA 100-25 MCG/INH AEPB INHALE 1 PUFF ONCE DAILY AS DIRECTED 90 each 3  . Calcium Carbonate-Vitamin D 600-400 MG-UNIT tablet Take 1 tablet by mouth 2 (two) times daily. 30 tablet 0  . dextromethorphan-guaiFENesin (MUCINEX DM) 30-600 MG 12hr tablet Take 1 tablet by mouth 2 (two) times daily.    . fesoterodine (TOVIAZ) 8 MG TB24 tablet Take 1 tablet (8 mg total) by mouth daily. 90 tablet 1  . fluticasone (FLONASE) 50 MCG/ACT nasal spray 2 SPRAYS IN EACH NOSTRIL QD 48 g 3  . furosemide (LASIX) 20 MG tablet Take 1 tablet (20 mg total) by mouth 2 (two) times daily. 90 tablet 3  . loratadine (CLARITIN) 10 MG tablet Take 10 mg by mouth daily.    Marland Kitchen losartan (COZAAR) 100 MG tablet Take 1 tablet (100 mg total) by mouth daily. 90 tablet 3  . SPIRIVA HANDIHALER 18 MCG inhalation capsule INHALE THE CONTENTS OF ONE CAPSULE ONCE DAILY 30 capsule 11   No facility-administered medications prior to visit.     ROS Review of Systems  Constitutional: Negative.  Negative for activity change, appetite change, diaphoresis, fatigue and unexpected weight change.  HENT: Negative.  Negative for trouble swallowing.   Eyes: Negative for visual disturbance.  Respiratory: Positive for shortness of breath (DOE). Negative for cough, chest tightness, wheezing and  stridor.   Cardiovascular: Negative for chest pain, palpitations and leg swelling.  Gastrointestinal: Negative for abdominal pain, blood in stool, constipation, diarrhea, nausea and vomiting.  Endocrine: Negative.   Genitourinary: Negative.  Negative for difficulty urinating.  Musculoskeletal: Negative.  Negative for arthralgias and myalgias.  Skin: Negative.   Allergic/Immunologic: Negative.   Neurological: Negative.  Negative for dizziness, weakness and light-headedness.  Hematological: Negative for adenopathy. Does not bruise/bleed easily.  Psychiatric/Behavioral: Negative.     Objective:  BP (!) 122/50 (BP Location: Left Arm)   Pulse 72   Temp 97.9 F (36.6 C) (Oral)   Resp 16   Ht 5\' 5"  (1.651 m)   Wt 185 lb (83.9 kg)   SpO2 94%   BMI 30.79 kg/m   BP Readings from Last 3 Encounters:  04/30/17 (!) 122/50  04/29/17 136/74  02/16/17 (!) 118/48    Wt Readings from Last 3 Encounters:  04/30/17 185 lb (83.9 kg)  04/29/17 182 lb (82.6 kg)  02/16/17 184 lb (83.5 kg)    Physical Exam  Constitutional: She is oriented to person, place, and time. No distress.  Sitting in a wheelchair but appears comfortable  HENT:  Mouth/Throat: Oropharynx is clear and moist. No oropharyngeal exudate.  Eyes: Conjunctivae are normal. Right eye exhibits no discharge. Left eye exhibits no discharge. No scleral icterus.  Neck: Normal range of motion. Neck supple. No  JVD present. No thyromegaly present.  Cardiovascular: Normal rate, regular rhythm and intact distal pulses.  Exam reveals no gallop and no friction rub.   No murmur heard. Pulmonary/Chest: Effort normal and breath sounds normal. No respiratory distress. She has no wheezes. She has no rales. She exhibits no tenderness.  Abdominal: Soft. Bowel sounds are normal. She exhibits no distension. There is no tenderness. There is no rebound and no guarding.  Musculoskeletal: Normal range of motion. She exhibits no edema, tenderness or  deformity.  Lymphadenopathy:    She has no cervical adenopathy.  Neurological: She is alert and oriented to person, place, and time.  Skin: Skin is warm and dry. She is not diaphoretic.  Psychiatric: She has a normal mood and affect. Her behavior is normal. Judgment and thought content normal.  Vitals reviewed.   Lab Results  Component Value Date   WBC 6.4 10/27/2016   HGB 13.6 10/27/2016   HCT 40.0 10/27/2016   PLT 324.0 10/27/2016   GLUCOSE 103 (H) 04/29/2017   CHOL 184 10/27/2016   TRIG 112.0 10/27/2016   HDL 56.20 10/27/2016   LDLCALC 105 (H) 10/27/2016   ALT 15 10/27/2016   AST 19 10/27/2016   NA 129 (L) 04/29/2017   K 4.4 04/29/2017   CL 92 (L) 04/29/2017   CREATININE 0.76 04/29/2017   BUN 11 04/29/2017   CO2 33 (H) 04/29/2017   TSH 2.84 10/27/2016   HGBA1C 5.8 04/29/2017    Dg Bone Density  Result Date: 09/06/2016 Date of study: 09/01/16 Exam: DUAL X-RAY ABSORPTIOMETRY (DXA) FOR BONE MINERAL DENSITY (BMD) Instrument: [employer] Provider: PCP Indication: follow up for low BMD Comparison: none (please note that it is not possible to compare data from different instruments) Clinical data: Pt is a 83 y.o. female with previous history of fracture. On calcium and vitamin D. Results:  Lumbar spine (L1-L4) Femoral neck (FN) 33% distal radius T-score -1.4 RFN:-1.2 LFN:-1.2 n/a Change in BMD from previous DXA test (%) n/a n/a n/a (*) statistically significant Assessment: the BMD is low according to the [facility] classification for osteoporosis (see below). Fracture risk: moderate FRAX score: 10 year major osteoporotic risk: 14.9%. 10 year hip fracture risk: 3.6%. These are not under the thresholds for treatment of 20% and 3%, respectively. Comments: the technical quality of the study is good. Scoliosis and degenerative change may falsely elevated spine BMD. Evaluation for secondary causes should be considered if clinically indicated. Recommend optimizing calcium (1200  mg/day) and vitamin D (800 IU/day) intake. Followup: Repeat BMD is appropriate after 2 years or after 1-2 years if starting treatment. WHO criteria for diagnosis of osteoporosis in postmenopausal women and in men 52 y/o or older: - normal: T-score -1.0 to + 1.0 - osteopenia/low bone density: T-score between -2.5 and -1.0 - osteoporosis: T-score below -2.5 - severe osteoporosis: T-score below -2.5 with history of fragility fracture Note: although not part of the WHO classification, the presence of a fragility fracture, regardless of the T-score, should be considered diagnostic of osteoporosis, provided other causes for the fracture have been excluded. Treatment: The National Osteoporosis Foundation recommends that treatment be considered in postmenopausal women and men age 53 or older with: 1. Hip or vertebral (clinical or morphometric) fracture 2. T-score of - 2.5 or lower at the spine or hip 3. 10-year fracture probability by FRAX of at least 20% for a major osteoporotic fracture and 3% for a hip fracture Loura Pardon MD    Assessment & Plan:  Sakari was seen today for medicare wellness, congestive heart failure and hypertension.  Diagnoses and all orders for this visit:  Diastolic dysfunction with acute on chronic heart failure ([place])- she has a normal fluid status. We'll continue loop diuretic.  Essential hypertension- her blood pressures well controlled. She has mild, stable hyponatremia and hypochloremia related to the loop diuretic. We'll continue the current dose. -     Basic metabolic panel; Future  Obesity (BMI 30-39.9)- she agrees to decrease her caloric intake in an effort to lose weight.  Hyperglycemia- her A1c is at 5.8%. She has mild prediabetes. Medical therapy is not indicated. -     Basic metabolic panel; Future -     Hemoglobin A1c; Future  Encounter for Medicare annual wellness exam   I am having Ms. Jagiello maintain her aspirin, Calcium Carbonate-Vitamin D, BREO ELLIPTA,  fluticasone, azelastine, SPIRIVA HANDIHALER, dextromethorphan-guaiFENesin, loratadine, losartan, fesoterodine, and furosemide.  No orders of the defined types were placed in this encounter.    Follow-up: Return in about 6 months (around 10/28/2017).  Scarlette Calico, MD

## 2017-04-29 NOTE — Patient Instructions (Addendum)
Xyzal Dosage Guide   Continue doing brain stimulating activities (puzzles, reading, adult coloring books, staying active) to keep memory sharp.   Continue to eat heart healthy diet (full of fruits, vegetables, whole grains, lean protein, water--limit salt, fat, and sugar intake) and increase physical activity as tolerated.   Carrie Crawford , Thank you for taking time to come for your Medicare Wellness Visit. I appreciate your ongoing commitment to your health goals. Please review the following plan we discussed and let me know if I can assist you in the future.   These are the goals we discussed: Goals      Patient Stated   . patient (pt-stated)          Will attempt sitter exercise; does a few every day       Other   . Stay as active and as independent as possible       This is a list of the screening recommended for you and due dates:  Health Maintenance  Topic Date Due  . Flu Shot  02/10/2017  . Tetanus Vaccine  04/22/2025  . DEXA scan (bone density measurement)  Completed  . Pneumonia vaccines  Completed   Influenza Virus Vaccine injection What is this medicine? INFLUENZA VIRUS VACCINE (in floo EN zuh VAHY ruhs vak SEEN) helps to reduce the risk of getting influenza also known as the flu. The vaccine only helps protect you against some strains of the flu. This medicine may be used for other purposes; ask your health care provider or pharmacist if you have questions. COMMON BRAND NAME(S): Afluria, Agriflu, Alfuria, FLUAD, Fluarix, Fluarix Quadrivalent, Flublok, Flublok Quadrivalent, FLUCELVAX, Flulaval, Fluvirin, Fluzone, Fluzone High-Dose, Fluzone Intradermal What should I tell my health care provider before I take this medicine? They need to know if you have any of these conditions: -bleeding disorder like hemophilia -fever or infection -Guillain-Barre syndrome or other neurological problems -immune system problems -infection with the human immunodeficiency virus (HIV) or  AIDS -low blood platelet counts -multiple sclerosis -an unusual or allergic reaction to influenza virus vaccine, latex, other medicines, foods, dyes, or preservatives. Different brands of vaccines contain different allergens. Some may contain latex or eggs. Talk to your doctor about your allergies to make sure that you get the right vaccine. -pregnant or trying to get pregnant -breast-feeding How should I use this medicine? This vaccine is for injection into a muscle or under the skin. It is given by a health care professional. A copy of Vaccine Information Statements will be given before each vaccination. Read this sheet carefully each time. The sheet may change frequently. Talk to your healthcare provider to see which vaccines are right for you. Some vaccines should not be used in all age groups. Overdosage: If you think you have taken too much of this medicine contact a poison control center or emergency room at once. NOTE: This medicine is only for you. Do not share this medicine with others. What if I miss a dose? This does not apply. What may interact with this medicine? -chemotherapy or radiation therapy -medicines that lower your immune system like etanercept, anakinra, infliximab, and adalimumab -medicines that treat or prevent blood clots like warfarin -phenytoin -steroid medicines like prednisone or cortisone -theophylline -vaccines This list may not describe all possible interactions. Give your health care provider a list of all the medicines, herbs, non-prescription drugs, or dietary supplements you use. Also tell them if you smoke, drink alcohol, or use illegal drugs. Some items may interact with  your medicine. What should I watch for while using this medicine? Report any side effects that do not go away within 3 days to your doctor or health care professional. Call your health care provider if any unusual symptoms occur within 6 weeks of receiving this vaccine. You may still  catch the flu, but the illness is not usually as bad. You cannot get the flu from the vaccine. The vaccine will not protect against colds or other illnesses that may cause fever. The vaccine is needed every year. What side effects may I notice from receiving this medicine? Side effects that you should report to your doctor or health care professional as soon as possible: -allergic reactions like skin rash, itching or hives, swelling of the face, lips, or tongue Side effects that usually do not require medical attention (report to your doctor or health care professional if they continue or are bothersome): -fever -headache -muscle aches and pains -pain, tenderness, redness, or swelling at the injection site -tiredness This list may not describe all possible side effects. Call your doctor for medical advice about side effects. You may report side effects to FDA at 1-800-FDA-1088. Where should I keep my medicine? The vaccine will be given by a health care professional in a clinic, pharmacy, doctor's office, or other health care setting. You will not be given vaccine doses to store at home. NOTE: This sheet is a summary. It may not cover all possible information. If you have questions about this medicine, talk to your doctor, pharmacist, or health care provider.  2018 Elsevier/Gold Standard (2015-01-18 10:07:28)    It is important to avoid accidents which may result in broken bones.  Here are a few ideas on how to make your home safer so you will be less likely to trip or fall.  1. Use nonskid mats or non slip strips in your shower or tub, on your bathroom floor and around sinks.  If you know that you have spilled water, wipe it up! 2. In the bathroom, it is important to have properly installed grab bars on the walls or on the edge of the tub.  Towel racks are NOT strong enough for you to hold onto or to pull on for support. 3. Stairs and hallways should have enough light.  Add lamps or night  lights if you need ore light. 4. It is good to have handrails on both sides of the stairs if possible.  Always fix broken handrails right away. 5. It is important to see the edges of steps.  Paint the edges of outdoor steps white so you can see them better.  Put colored tape on the edge of inside steps. 6. Throw-rugs are dangerous because they can slide.  Removing the rugs is the best idea, but if they must stay, add adhesive carpet tape to prevent slipping. 7. Do not keep things on stairs or in the halls.  Remove small furniture that blocks the halls as it may cause you to trip.  Keep telephone and electrical cords out of the way where you walk. 8. Always were sturdy, rubber-soled shoes for good support.  Never wear just socks, especially on the stairs.  Socks may cause you to slip or fall.  Do not wear full-length housecoats as you can easily trip on the bottom.  9. Place the things you use the most on the shelves that are the easiest to reach.  If you use a stepstool, make sure it is in good condition.  If  you feel unsteady, DO NOT climb, ask for help. If a health professional advises you to use a cane or walker, do not be ashamed.  These items can keep you from falling and breaking your bones.

## 2017-04-29 NOTE — Patient Instructions (Signed)

## 2017-04-29 NOTE — Progress Notes (Addendum)
Subjective:   Carrie Crawford is a 83 y.o. female who presents for Medicare Annual (Subsequent) preventive examination.  Review of Systems:  No ROS.  Medicare Wellness Visit. Additional risk factors are reflected in the social history.  Cardiac Risk Factors include: advanced age (>21men, >62 women);dyslipidemia;hypertension;sedentary lifestyle;obesity (BMI >30kg/m2) Sleep patterns: gets up 2-3 times nightly to void and sleeps 6-7 hours nightly.    Home Safety/Smoke Alarms: Feels safe in home. Smoke alarms in place.  Living environment; residence and Firearm Safety: [place], can live on one level, equipment: Walkers, Type: [employer] fast streamer, Type: Tub Surveyor, quantity, wheelchair, no firearms.Lives with son, no needs for DME, good support system Seat Belt Safety/Bike Helmet: Wears seat belt.      Objective:     Vitals: There were no vitals taken for this visit.  There is no height or weight on file to calculate BMI.   Tobacco History  Smoking Status  . Never Smoker  Smokeless Tobacco  . Never Used     Counseling given: Not Answered   Past Medical History:  Diagnosis Date  . COPD (chronic obstructive pulmonary disease) (HCC)    chronic bronchitis  . Dyslipidemia   . Hypertension   . Multiple allergies   . Osteopenia   . Rhinitis, chronic    Past Surgical History:  Procedure Laterality Date  . CHOLECYSTECTOMY    . THYROIDECTOMY, PARTIAL  2004   [facility]   Family History  Problem Relation Age of Onset  . Heart disease Brother   . Cancer Daughter        endometial/uterine cancer(nodule in right lung)   History  Sexual Activity  . Sexual activity: Not on file    Outpatient Encounter Prescriptions as of 04/29/2017  Medication Sig  . aspirin 81 MG tablet Take 81 mg by mouth daily.    Marland Kitchen azelastine (ASTELIN) 0.1 % nasal spray 2 sprays in each nostril at bedtime.  Marland Kitchen BREO ELLIPTA 100-25 MCG/INH AEPB INHALE 1 PUFF ONCE DAILY AS DIRECTED  . Calcium  Carbonate-Vitamin D 600-400 MG-UNIT tablet Take 1 tablet by mouth 2 (two) times daily.  Marland Kitchen dextromethorphan-guaiFENesin (MUCINEX DM) 30-600 MG 12hr tablet Take 1 tablet by mouth 2 (two) times daily.  . fesoterodine (TOVIAZ) 8 MG TB24 tablet Take 1 tablet (8 mg total) by mouth daily.  . fluticasone (FLONASE) 50 MCG/ACT nasal spray 2 SPRAYS IN EACH NOSTRIL QD  . furosemide (LASIX) 20 MG tablet Take 1 tablet (20 mg total) by mouth 2 (two) times daily.  Marland Kitchen loratadine (CLARITIN) 10 MG tablet Take 10 mg by mouth daily.  Marland Kitchen losartan (COZAAR) 100 MG tablet Take 1 tablet (100 mg total) by mouth daily.  Marland Kitchen SPIRIVA HANDIHALER 18 MCG inhalation capsule INHALE THE CONTENTS OF ONE CAPSULE ONCE DAILY   No facility-administered encounter medications on file as of 04/29/2017.     Activities of Daily Living In your present state of health, do you have any difficulty performing the following activities: 04/29/2017 04/29/2017  Hearing? Y N  Vision? N N  Difficulty concentrating or making decisions? Y N  Walking or climbing stairs? Y N  Dressing or bathing? Y N  Doing errands, shopping? Y N  Preparing Food and eating ? Y N  Using the Toilet? N N  In the past six months, have you accidently leaked urine? N N  Do you have problems with loss of bowel control? N N  Managing your Medications? Y N  Managing your Finances? Darreld Crawford  N  Housekeeping or managing your Housekeeping? Y N  Some recent data might be hidden    Patient Care Team: Janith Lima, MD as PCP - General (Internal Medicine) Deneise Lever, MD (Pulmonary Disease)    Assessment:    Physical assessment deferred to PCP.  Exercise Activities and Dietary recommendations Current Exercise Habits: Home exercise routine, Time (Minutes): 20, Frequency (Times/Week): 3, Weekly Exercise (Minutes/Week): 60, Intensity: Mild, Exercise limited by: orthopedic condition(s)  Diet (meal preparation, eat out, water intake, caffeinated beverages, dairy products,  fruits and vegetables): in general, a "healthy" diet     Discussed supplementing with ensure, encouraged patient to increase daily water intake.  Goals      Patient Stated   . patient (pt-stated)          Will attempt sitter exercise; does a few every day       Other   . Stay as active and as independent as possible      Fall Risk Fall Risk  04/29/2017 02/18/2017 10/23/2015 04/23/2015  Falls in the past year? Yes No No Yes  Number falls in past yr: 1 - - 2 or more  Injury with Fall? No - - No  Follow up Falls prevention discussed;Education provided - - Education provided  Comment - - - getting up going to the bathroom at hs   Depression Screen PHQ 2/9 Scores 04/29/2017 02/18/2017 10/23/2015 04/23/2015  PHQ - 2 Score 0 0 0 0     Cognitive Function MMSE - Mini Mental State Exam 04/29/2017 04/29/2017 04/23/2015  Orientation to time 4 4 3   Orientation to Place 2 2 5   Registration 3 - 3  Attention/ Calculation 5 5 3   Recall 0 0 0  Language- name 2 objects 2 - 2  Language- repeat 1 - 1  Language- follow 3 step command 3 - 3  Language- read & follow direction 1 - 1  Write a sentence 1 - 1  Copy design 0 - 0  Total score 22 - 22        Immunization History  Administered Date(s) Administered  . Influenza, High Dose Seasonal PF 04/23/2016, 04/29/2017  . Influenza,inj,Quad PF,6+ Mos 04/26/2013, 04/19/2014, 04/23/2015  . Pneumococcal Conjugate-13 05/25/2014  . Pneumococcal Polysaccharide-23 03/14/1999, 04/23/2015  . Tdap 04/23/2015  . Zoster 03/25/2011   Screening Tests Health Maintenance  Topic Date Due  . TETANUS/TDAP  04/22/2025  . INFLUENZA VACCINE  Completed  . DEXA SCAN  Completed  . PNA vac Low Risk Adult  Completed      Plan:     I have personally reviewed and noted the following in the patient's chart:   . Medical and social history . Use of alcohol, tobacco or illicit drugs  . Current medications and supplements . Functional ability and  status . Nutritional status . Physical activity . Advanced directives . List of other physicians . Vitals . Screenings to include cognitive, depression, and falls . Referrals and appointments  In addition, I have reviewed and discussed with patient certain preventive protocols, quality metrics, and best practice recommendations. A written personalized care plan for preventive services as well as general preventive health recommendations were provided to patient.   Medical screening examination/treatment/procedure(s) were performed by non-physician practitioner and as supervising physician I was immediately available for consultation/collaboration. I agree with above. Scarlette Calico, MD   Michiel Cowboy, RN  04/29/2017

## 2017-04-29 NOTE — Progress Notes (Signed)
Pre visit review using our clinic review tool, if applicable. No additional management support is needed unless otherwise documented below in the visit note. 

## 2017-04-30 ENCOUNTER — Encounter: Payer: Self-pay | Admitting: Internal Medicine

## 2017-09-02 DIAGNOSIS — I5033 Acute on chronic diastolic (congestive) heart failure: Secondary | ICD-10-CM | POA: Diagnosis not present

## 2017-09-02 DIAGNOSIS — E669 Obesity, unspecified: Secondary | ICD-10-CM | POA: Diagnosis not present

## 2017-09-02 DIAGNOSIS — C3490 Malignant neoplasm of unspecified part of unspecified bronchus or lung: Secondary | ICD-10-CM | POA: Diagnosis not present

## 2017-09-02 DIAGNOSIS — I10 Essential (primary) hypertension: Secondary | ICD-10-CM | POA: Diagnosis not present

## 2017-09-06 ENCOUNTER — Ambulatory Visit: Payer: Self-pay | Admitting: Pediatrics

## 2017-09-10 ENCOUNTER — Ambulatory Visit (INDEPENDENT_AMBULATORY_CARE_PROVIDER_SITE_OTHER): Payer: Medicare HMO | Admitting: Pediatrics

## 2017-09-10 ENCOUNTER — Encounter: Payer: Self-pay | Admitting: Pediatrics

## 2017-09-10 VITALS — BP 150/78 | HR 78 | Temp 97.1°F | Ht 65.0 in | Wt 189.4 lb

## 2017-09-10 DIAGNOSIS — R350 Frequency of micturition: Secondary | ICD-10-CM | POA: Diagnosis not present

## 2017-09-10 DIAGNOSIS — I10 Essential (primary) hypertension: Secondary | ICD-10-CM

## 2017-09-10 DIAGNOSIS — J449 Chronic obstructive pulmonary disease, unspecified: Secondary | ICD-10-CM | POA: Diagnosis not present

## 2017-09-10 DIAGNOSIS — I5032 Chronic diastolic (congestive) heart failure: Secondary | ICD-10-CM

## 2017-09-10 MED ORDER — LOSARTAN POTASSIUM 100 MG PO TABS: 100.0000 mg | ORAL_TABLET | Freq: Every day | ORAL | 3 refills | Status: DC

## 2017-09-10 MED ORDER — FUROSEMIDE 20 MG PO TABS: 20.0000 mg | ORAL_TABLET | Freq: Two times a day (BID) | ORAL | 3 refills | Status: DC

## 2017-09-10 MED ORDER — FLUTICASONE FUROATE-VILANTEROL 100-25 MCG/INH IN AEPB: INHALATION_SPRAY | RESPIRATORY_TRACT | 3 refills | Status: DC

## 2017-09-10 MED ORDER — TIOTROPIUM BROMIDE MONOHYDRATE 18 MCG IN CAPS: ORAL_CAPSULE | RESPIRATORY_TRACT | 11 refills | Status: DC

## 2017-09-10 NOTE — Progress Notes (Signed)
Subjective:   Patient ID: Danella Maiers, female    DOB: 02-20-1926, 84 y.o.   MRN: [id number] CC: New Patient (Initial Visit) follow up med problems HPI: JOANI COSMA is a 84 y.o. female presenting for New Patient (Initial Visit)  Here today with her son who she lives with.   Walks with walker primarily at home. Here today in her wheelchair, uses when she leaves house. No recent falls.   HTN: no chest pain or pressure. Taking blood pressure medicine regularly. Son checked BP yesterday before dinner, was 124/66. Not regularly checking. No lightheadedness  CHF: taking lasix morning and night. Gets up 1-3 times at night to empty her bladder, often small amount. Was started on fesoterodine a few months ago. Son is not sure if it has made a difference.   COPD: breathing has been fine. Using inhalers regularly. No SOB or wheezing recently   Past Medical History:  Diagnosis Date  . COPD (chronic obstructive pulmonary disease) (HCC)    chronic bronchitis  . Dyslipidemia   . Hypertension   . Multiple allergies   . Osteopenia   . Rhinitis, chronic    Family History  Problem Relation Age of Onset  . Heart disease Brother   . Cancer Daughter        endometial/uterine cancer(nodule in right lung)   Social History   Socioeconomic History  . Marital status: Widowed    Spouse name: None  . Number of children: None  . Years of education: None  . Highest education level: None  Social Needs  . Financial resource strain: None  . Food insecurity - worry: None  . Food insecurity - inability: None  . Transportation needs - medical: None  . Transportation needs - non-medical: None  Occupational History  . None  Tobacco Use  . Smoking status: Never Smoker  . Smokeless tobacco: Never Used  Substance and Sexual Activity  . Alcohol use: No    Alcohol/week: 0.0 oz  . Drug use: No  . Sexual activity: None  Other Topics Concern  . None  Social History Narrative  . None   ROS: All  systems negative other than what is in HPI  Objective:    BP (!) 150/78   Pulse 78   Temp (!) 97.1 F (36.2 C) (Oral)   Ht 5\' 5"  (1.651 m)   Wt 189 lb 6.4 oz (85.9 kg)   BMI 31.52 kg/m   Wt Readings from Last 3 Encounters:  09/10/17 189 lb 6.4 oz (85.9 kg)  04/30/17 185 lb (83.9 kg)  04/29/17 182 lb (82.6 kg)    Gen: NAD, alert, sititng in wheelchair, cooperative with exam, NCAT EYES: EOMI, no conjunctival injection, or no icterus ENT:   OP without erythema LYMPH: no cervical LAD CV: NRRR, normal S1/S2, no murmur, distal pulses 2+ b/l Resp: CTABL, no wheezes, normal WOB Abd: +BS, soft, NTND. Ext: trace pitting edema b/l shins, warm Neuro: Alert and oriented  Assessment & Plan:  Iyona was seen today for new patient (initial visit), follow up med problems. Diagnoses and all orders for this visit:  Essential hypertension Elevated. Check at home, call in two weeks with BPs -     Basic Metabolic Panel -     losartan (COZAAR) 100 MG tablet; Take 1 tablet (100 mg total) by mouth daily.  Chronic obstructive pulmonary disease, unspecified COPD type (HCC) Stable, cont below -     tiotropium (SPIRIVA HANDIHALER) 18 MCG inhalation capsule; INHALE THE  CONTENTS OF ONE CAPSULE ONCE DAILY -     fluticasone furoate-vilanterol (BREO ELLIPTA) 100-25 MCG/INH AEPB; INHALE 1 PUFF ONCE DAILY AS DIRECTED  Diastolic dysfunction with chronic heart failure (HCC) Euvolemic today. Take lasix in morning and early afternoon -     furosemide (LASIX) 20 MG tablet; Take 1 tablet (20 mg total) by mouth 2 (two) times daily.  Frequent urination Move lasix to afternoon instead of bedtime. Has been out of fesoterodine past 2 weeks with no change in symptoms per son and pt. Will dc for now.  Follow up plan: Return in about 2 months (around 11/10/2017). Assunta Found, MD [place]

## 2017-09-10 NOTE — Patient Instructions (Addendum)
Check blood pressures at home. Bring to next office visit. Continue to check weights regularly.

## 2017-09-11 LAB — BASIC METABOLIC PANEL
BUN/Creatinine Ratio: 14 (ref 12–28)
BUN: 11 mg/dL (ref 8–27)
CHLORIDE: 94 mmol/L — AB (ref 96–106)
CO2: 26 mmol/L (ref 20–29)
Calcium: 8.8 mg/dL (ref 8.7–10.3)
Creatinine, Ser: 0.76 mg/dL (ref 0.57–1.00)
GFR calc non Af Amer: 70 mL/min/{1.73_m2} (ref >59)
GFR, EST AFRICAN AMERICAN: 80 mL/min/{1.73_m2} (ref >59)
Glucose: 101 mg/dL — ABNORMAL HIGH (ref 65–99)
POTASSIUM: 4.4 mmol/L (ref 3.5–5.2)
Sodium: 132 mmol/L — ABNORMAL LOW (ref 134–144)

## 2017-09-16 ENCOUNTER — Other Ambulatory Visit: Payer: Self-pay | Admitting: *Deleted

## 2017-09-16 DIAGNOSIS — J310 Chronic rhinitis: Secondary | ICD-10-CM

## 2017-09-16 MED ORDER — AZELASTINE HCL 0.1 % NA SOLN: NASAL | 3 refills | Status: DC

## 2017-09-16 MED ORDER — FLUTICASONE PROPIONATE 50 MCG/ACT NA SUSP: NASAL | 3 refills | Status: DC

## 2017-09-30 DIAGNOSIS — I5033 Acute on chronic diastolic (congestive) heart failure: Secondary | ICD-10-CM | POA: Diagnosis not present

## 2017-09-30 DIAGNOSIS — E669 Obesity, unspecified: Secondary | ICD-10-CM | POA: Diagnosis not present

## 2017-09-30 DIAGNOSIS — C3490 Malignant neoplasm of unspecified part of unspecified bronchus or lung: Secondary | ICD-10-CM | POA: Diagnosis not present

## 2017-09-30 DIAGNOSIS — I10 Essential (primary) hypertension: Secondary | ICD-10-CM | POA: Diagnosis not present

## 2017-10-28 ENCOUNTER — Ambulatory Visit: Payer: Medicare Other | Admitting: Internal Medicine

## 2017-10-31 DIAGNOSIS — C3490 Malignant neoplasm of unspecified part of unspecified bronchus or lung: Secondary | ICD-10-CM | POA: Diagnosis not present

## 2017-10-31 DIAGNOSIS — I5033 Acute on chronic diastolic (congestive) heart failure: Secondary | ICD-10-CM | POA: Diagnosis not present

## 2017-10-31 DIAGNOSIS — I10 Essential (primary) hypertension: Secondary | ICD-10-CM | POA: Diagnosis not present

## 2017-10-31 DIAGNOSIS — E669 Obesity, unspecified: Secondary | ICD-10-CM | POA: Diagnosis not present

## 2017-11-08 ENCOUNTER — Ambulatory Visit (INDEPENDENT_AMBULATORY_CARE_PROVIDER_SITE_OTHER): Payer: Medicare HMO | Admitting: Pediatrics

## 2017-11-08 ENCOUNTER — Encounter: Payer: Self-pay | Admitting: Pediatrics

## 2017-11-08 VITALS — BP 132/73 | HR 90 | Temp 97.9°F | Ht 65.0 in | Wt 190.0 lb

## 2017-11-08 DIAGNOSIS — I10 Essential (primary) hypertension: Secondary | ICD-10-CM

## 2017-11-08 DIAGNOSIS — I5032 Chronic diastolic (congestive) heart failure: Secondary | ICD-10-CM | POA: Diagnosis not present

## 2017-11-08 DIAGNOSIS — J42 Unspecified chronic bronchitis: Secondary | ICD-10-CM

## 2017-11-08 LAB — BASIC METABOLIC PANEL
BUN/Creatinine Ratio: 15 (ref 12–28)
BUN: 11 mg/dL (ref 8–27)
CO2: 25 mmol/L (ref 20–29)
CREATININE: 0.75 mg/dL (ref 0.57–1.00)
Calcium: 8.6 mg/dL — ABNORMAL LOW (ref 8.7–10.3)
Chloride: 93 mmol/L — ABNORMAL LOW (ref 96–106)
GFR calc Af Amer: 82 mL/min/{1.73_m2} (ref >59)
GFR, EST NON AFRICAN AMERICAN: 71 mL/min/{1.73_m2} (ref >59)
GLUCOSE: 115 mg/dL — AB (ref 65–99)
Potassium: 4.3 mmol/L (ref 3.5–5.2)
Sodium: 130 mmol/L — ABNORMAL LOW (ref 134–144)

## 2017-11-08 MED ORDER — FUROSEMIDE 20 MG PO TABS: 20.0000 mg | ORAL_TABLET | Freq: Every day | ORAL | 3 refills | Status: DC

## 2017-11-08 NOTE — Patient Instructions (Signed)
Check blood pressures at home.  Restart home exercises from physical therapy.

## 2017-11-08 NOTE — Progress Notes (Signed)
  Subjective:   Patient ID: Carrie Crawford, female    DOB: 1926/05/23, 84 y.o.   MRN: [id number] CC: Follow-up medical problems HPI: Carrie Crawford is a 84 y.o. female   Here today with her son with whom she lives.  Overall is been doing fine.  Taking Lasix twice a day, in the morning around 1:00p.  No swelling in lower feet.  Continues to have some shortness of breath with exertion.  On multiple medicines for chronic bronchitis.   Was diagnosed with diastolic dysfunction in the past, no echo available for review.  Getting up the several steps into the house son says she gets very short of breath and needs to rest.  Leaving the house only for appointments.  Using walker at home to get around.  Son was having to help her get in and out of bed for a couple weeks several weeks ago due to increased back pain.  She took Tylenol daily during that time.  Back pain has improved since then.  Hypertension: Checking blood pressures regularly at home.  Systolics range mostly 361W to 140s, down to the 120s, up to the 150s rarely.  Diastolics 43X to 54M mostly.  Son says if BP is high, when he rechecks it several minutes later it often will be lower.  No new falls.  Relevant past medical, surgical, family and social history reviewed. Allergies and medications reviewed and updated. Social History   Tobacco Use  Smoking Status Never Smoker  Smokeless Tobacco Never Used   ROS: Per HPI   Objective:    BP 132/73   Pulse 90   Temp 97.9 F (36.6 C) (Oral)   Ht 5\' 5"  (1.651 m)   Wt 190 lb (86.2 kg)   BMI 31.62 kg/m   O2 sat 95% Wt Readings from Last 3 Encounters:  11/08/17 190 lb (86.2 kg)  09/10/17 189 lb 6.4 oz (85.9 kg)  04/30/17 185 lb (83.9 kg)    Gen: NAD, alert, cooperative with exam, NCAT, sitting in wheelchair EYES: EOMI, no conjunctival injection, or no icterus LYMPH: no cervical LAD CV: NRRR, normal S1/S2, no murmur Resp: CTABL, no wheezes, normal WOB Abd: +BS, soft, NTND Ext:  No edema, warm Neuro: Alert and oriented MSK: normal muscle bulk  Assessment & Plan:  84 year old female here today for follow-up multiple medical problems.  Diagnoses and all orders for this visit:  Essential hypertension Fairly well controlled.  Continue to check at home. -     Basic Metabolic Panel  Diastolic dysfunction with chronic heart failure (HCC) Ongoing shortness of breath with exertion.  No worse than usual.  Better when she was regularly working with physical therapy.  Also with chronic lung disease and deconditioning.  Son is going to restart some gentle home exercises.  Will get echo. -     ECHOCARDIOGRAM COMPLETE; Future -     furosemide (LASIX) 20 MG tablet; Take 1 tablet (20 mg total) by mouth daily.  Chronic bronchitis, unspecified chronic bronchitis type ([employer]) Continue current medicines.  Hyponatremia Decrease Lasix to once a day, no swelling today.  Recheck labs today.  Follow up plan: Return in about 3 months (around 02/07/2018).  Sooner if needed. Assunta Found, MD [place]

## 2017-11-15 ENCOUNTER — Ambulatory Visit (HOSPITAL_COMMUNITY): Admission: RE | Admit: 2017-11-15 | Payer: Medicare HMO | Source: Ambulatory Visit

## 2017-11-30 ENCOUNTER — Ambulatory Visit (HOSPITAL_COMMUNITY)
Admission: RE | Admit: 2017-11-30 | Discharge: 2017-11-30 | Disposition: A | Discharge status: Home / Self Care | Payer: Medicare HMO | Source: Ambulatory Visit | Attending: Pediatrics | Admitting: Pediatrics

## 2017-11-30 DIAGNOSIS — I5032 Chronic diastolic (congestive) heart failure: Secondary | ICD-10-CM | POA: Insufficient documentation

## 2017-11-30 DIAGNOSIS — I10 Essential (primary) hypertension: Secondary | ICD-10-CM | POA: Diagnosis not present

## 2017-11-30 DIAGNOSIS — C3490 Malignant neoplasm of unspecified part of unspecified bronchus or lung: Secondary | ICD-10-CM | POA: Diagnosis not present

## 2017-11-30 DIAGNOSIS — I5033 Acute on chronic diastolic (congestive) heart failure: Secondary | ICD-10-CM | POA: Diagnosis not present

## 2017-11-30 DIAGNOSIS — I110 Hypertensive heart disease with heart failure: Secondary | ICD-10-CM | POA: Insufficient documentation

## 2017-11-30 DIAGNOSIS — E669 Obesity, unspecified: Secondary | ICD-10-CM | POA: Insufficient documentation

## 2017-11-30 DIAGNOSIS — E785 Hyperlipidemia, unspecified: Secondary | ICD-10-CM | POA: Insufficient documentation

## 2017-11-30 NOTE — Progress Notes (Signed)
*  PRELIMINARY RESULTS* Echocardiogram 2D Echocardiogram has been performed.  Samuel Germany 11/30/2017, 3:02 PM

## 2017-12-31 DIAGNOSIS — I10 Essential (primary) hypertension: Secondary | ICD-10-CM | POA: Diagnosis not present

## 2017-12-31 DIAGNOSIS — I5033 Acute on chronic diastolic (congestive) heart failure: Secondary | ICD-10-CM | POA: Diagnosis not present

## 2017-12-31 DIAGNOSIS — E669 Obesity, unspecified: Secondary | ICD-10-CM | POA: Diagnosis not present

## 2017-12-31 DIAGNOSIS — C3490 Malignant neoplasm of unspecified part of unspecified bronchus or lung: Secondary | ICD-10-CM | POA: Diagnosis not present

## 2018-01-30 DIAGNOSIS — I5033 Acute on chronic diastolic (congestive) heart failure: Secondary | ICD-10-CM | POA: Diagnosis not present

## 2018-01-30 DIAGNOSIS — E669 Obesity, unspecified: Secondary | ICD-10-CM | POA: Diagnosis not present

## 2018-01-30 DIAGNOSIS — C3490 Malignant neoplasm of unspecified part of unspecified bronchus or lung: Secondary | ICD-10-CM | POA: Diagnosis not present

## 2018-01-30 DIAGNOSIS — I10 Essential (primary) hypertension: Secondary | ICD-10-CM | POA: Diagnosis not present

## 2018-02-07 ENCOUNTER — Ambulatory Visit (INDEPENDENT_AMBULATORY_CARE_PROVIDER_SITE_OTHER): Payer: Medicare HMO | Admitting: Pediatrics

## 2018-02-07 ENCOUNTER — Encounter: Payer: Self-pay | Admitting: Pediatrics

## 2018-02-07 VITALS — BP 130/69 | HR 82 | Temp 97.8°F | Ht 65.0 in | Wt 189.6 lb

## 2018-02-07 DIAGNOSIS — R2681 Unsteadiness on feet: Secondary | ICD-10-CM

## 2018-02-07 DIAGNOSIS — I10 Essential (primary) hypertension: Secondary | ICD-10-CM | POA: Diagnosis not present

## 2018-02-07 DIAGNOSIS — I5032 Chronic diastolic (congestive) heart failure: Secondary | ICD-10-CM | POA: Diagnosis not present

## 2018-02-07 NOTE — Progress Notes (Signed)
  Subjective:   Patient ID: Carrie Crawford, female    DOB: 01/03/1926, 84 y.o.   MRN: [id number] CC: Medical Management of Chronic Issues  HPI: Carrie Crawford is a 84 y.o. female   Here today with her son  Hypertension: Taking her medicines regularly.  Blood pressures at home systolics range 962X to 528U, rarely 150s.  Diastolics 13K to 44W.  No shortness of breath, chest pain.    Lower leg swelling: They try going down to once daily Lasix.  She started having more swelling in her leg so they went back to twice daily Lasix.  Uses a walker primarily to get around at home.  Not getting around much other than to make it to the bathroom.  She has some unsteadiness on her feet.  She had physical therapy in the past with improvement in her activity level, confidence with walking.  She is been feeling about the same, no fevers, no malaise or fatigue.  Her appetite is been good.  Relevant past medical, surgical, family and social history reviewed. Allergies and medications reviewed and updated. Social History   Tobacco Use  Smoking Status Never Smoker  Smokeless Tobacco Never Used   ROS: Per HPI   Objective:    BP 130/69   Pulse 82   Temp 97.8 F (36.6 C) (Oral)   Ht 5\' 5"  (1.651 m)   Wt 189 lb 9.6 oz (86 kg)   BMI 31.55 kg/m   Wt Readings from Last 3 Encounters:  02/07/18 189 lb 9.6 oz (86 kg)  11/08/17 190 lb (86.2 kg)  09/10/17 189 lb 6.4 oz (85.9 kg)    Gen: NAD, alert, cooperative with exam, NCAT EYES: EOMI, no conjunctival injection, or no icterus ENT:  OP without erythema LYMPH: no cervical LAD CV: NRRR, normal S1/S2 Resp: CTABL, no wheezes, normal WOB Abd: +BS, soft, NTND. no guarding or organomegaly Ext: No pitting edema bilateral ankles, warm Neuro: Alert and oriented, sitting in wheelchair  Assessment & Plan:  Carrie Crawford was seen today for medical management of chronic issues.  Diagnoses and all orders for this visit:  Essential hypertension Stable, continue  medicines -     Basic Metabolic Panel  Diastolic dysfunction with chronic heart failure (HCC) Hyponatremia Continue Lasix, 1-2 times a day as needed.  Keep to less than 1.5 L of free water a day.  We will continue to monitor fluid status, sodium level.  Gait instability Referral for PT to work with strength training, walker use. -     Ambulatory referral to [facility] -     Face-to-face encounter (required for Medicare/Medicaid patients)   Follow up plan: Return in about 6 months (around 08/10/2018). Assunta Found, MD [place]

## 2018-02-08 LAB — BASIC METABOLIC PANEL
BUN/Creatinine Ratio: 13 (ref 12–28)
BUN: 9 mg/dL (ref 8–27)
CALCIUM: 8.8 mg/dL (ref 8.7–10.3)
CO2: 27 mmol/L (ref 20–29)
CREATININE: 0.72 mg/dL (ref 0.57–1.00)
Chloride: 91 mmol/L — ABNORMAL LOW (ref 96–106)
GFR calc Af Amer: 86 mL/min/{1.73_m2} (ref >59)
GFR calc non Af Amer: 74 mL/min/{1.73_m2} (ref >59)
GLUCOSE: 96 mg/dL (ref 65–99)
Potassium: 4.7 mmol/L (ref 3.5–5.2)
Sodium: 131 mmol/L — ABNORMAL LOW (ref 134–144)

## 2018-02-11 DIAGNOSIS — N3281 Overactive bladder: Secondary | ICD-10-CM | POA: Diagnosis not present

## 2018-02-11 DIAGNOSIS — I509 Heart failure, unspecified: Secondary | ICD-10-CM | POA: Diagnosis not present

## 2018-02-11 DIAGNOSIS — J449 Chronic obstructive pulmonary disease, unspecified: Secondary | ICD-10-CM | POA: Diagnosis not present

## 2018-02-11 DIAGNOSIS — M810 Age-related osteoporosis without current pathological fracture: Secondary | ICD-10-CM | POA: Diagnosis not present

## 2018-02-11 DIAGNOSIS — I110 Hypertensive heart disease with heart failure: Secondary | ICD-10-CM | POA: Diagnosis not present

## 2018-02-11 DIAGNOSIS — E785 Hyperlipidemia, unspecified: Secondary | ICD-10-CM | POA: Diagnosis not present

## 2018-02-16 DIAGNOSIS — E785 Hyperlipidemia, unspecified: Secondary | ICD-10-CM | POA: Diagnosis not present

## 2018-02-16 DIAGNOSIS — J449 Chronic obstructive pulmonary disease, unspecified: Secondary | ICD-10-CM | POA: Diagnosis not present

## 2018-02-16 DIAGNOSIS — N3281 Overactive bladder: Secondary | ICD-10-CM | POA: Diagnosis not present

## 2018-02-16 DIAGNOSIS — M810 Age-related osteoporosis without current pathological fracture: Secondary | ICD-10-CM | POA: Diagnosis not present

## 2018-02-16 DIAGNOSIS — I110 Hypertensive heart disease with heart failure: Secondary | ICD-10-CM | POA: Diagnosis not present

## 2018-02-16 DIAGNOSIS — I509 Heart failure, unspecified: Secondary | ICD-10-CM | POA: Diagnosis not present

## 2018-02-18 DIAGNOSIS — E785 Hyperlipidemia, unspecified: Secondary | ICD-10-CM | POA: Diagnosis not present

## 2018-02-18 DIAGNOSIS — N3281 Overactive bladder: Secondary | ICD-10-CM | POA: Diagnosis not present

## 2018-02-18 DIAGNOSIS — J449 Chronic obstructive pulmonary disease, unspecified: Secondary | ICD-10-CM | POA: Diagnosis not present

## 2018-02-18 DIAGNOSIS — I110 Hypertensive heart disease with heart failure: Secondary | ICD-10-CM | POA: Diagnosis not present

## 2018-02-18 DIAGNOSIS — I509 Heart failure, unspecified: Secondary | ICD-10-CM | POA: Diagnosis not present

## 2018-02-18 DIAGNOSIS — M810 Age-related osteoporosis without current pathological fracture: Secondary | ICD-10-CM | POA: Diagnosis not present

## 2018-02-22 DIAGNOSIS — N3281 Overactive bladder: Secondary | ICD-10-CM | POA: Diagnosis not present

## 2018-02-22 DIAGNOSIS — I110 Hypertensive heart disease with heart failure: Secondary | ICD-10-CM | POA: Diagnosis not present

## 2018-02-22 DIAGNOSIS — J449 Chronic obstructive pulmonary disease, unspecified: Secondary | ICD-10-CM | POA: Diagnosis not present

## 2018-02-22 DIAGNOSIS — E785 Hyperlipidemia, unspecified: Secondary | ICD-10-CM | POA: Diagnosis not present

## 2018-02-22 DIAGNOSIS — M810 Age-related osteoporosis without current pathological fracture: Secondary | ICD-10-CM | POA: Diagnosis not present

## 2018-02-22 DIAGNOSIS — I509 Heart failure, unspecified: Secondary | ICD-10-CM | POA: Diagnosis not present

## 2018-02-25 DIAGNOSIS — N3281 Overactive bladder: Secondary | ICD-10-CM | POA: Diagnosis not present

## 2018-02-25 DIAGNOSIS — M810 Age-related osteoporosis without current pathological fracture: Secondary | ICD-10-CM | POA: Diagnosis not present

## 2018-02-25 DIAGNOSIS — I509 Heart failure, unspecified: Secondary | ICD-10-CM | POA: Diagnosis not present

## 2018-02-25 DIAGNOSIS — J449 Chronic obstructive pulmonary disease, unspecified: Secondary | ICD-10-CM | POA: Diagnosis not present

## 2018-02-25 DIAGNOSIS — E785 Hyperlipidemia, unspecified: Secondary | ICD-10-CM | POA: Diagnosis not present

## 2018-02-25 DIAGNOSIS — I110 Hypertensive heart disease with heart failure: Secondary | ICD-10-CM | POA: Diagnosis not present

## 2018-03-01 DIAGNOSIS — M810 Age-related osteoporosis without current pathological fracture: Secondary | ICD-10-CM | POA: Diagnosis not present

## 2018-03-01 DIAGNOSIS — I509 Heart failure, unspecified: Secondary | ICD-10-CM | POA: Diagnosis not present

## 2018-03-01 DIAGNOSIS — N3281 Overactive bladder: Secondary | ICD-10-CM | POA: Diagnosis not present

## 2018-03-01 DIAGNOSIS — E785 Hyperlipidemia, unspecified: Secondary | ICD-10-CM | POA: Diagnosis not present

## 2018-03-01 DIAGNOSIS — J449 Chronic obstructive pulmonary disease, unspecified: Secondary | ICD-10-CM | POA: Diagnosis not present

## 2018-03-01 DIAGNOSIS — I110 Hypertensive heart disease with heart failure: Secondary | ICD-10-CM | POA: Diagnosis not present

## 2018-03-02 ENCOUNTER — Ambulatory Visit (INDEPENDENT_AMBULATORY_CARE_PROVIDER_SITE_OTHER): Payer: Medicare HMO

## 2018-03-02 DIAGNOSIS — J449 Chronic obstructive pulmonary disease, unspecified: Secondary | ICD-10-CM | POA: Diagnosis not present

## 2018-03-02 DIAGNOSIS — E785 Hyperlipidemia, unspecified: Secondary | ICD-10-CM | POA: Diagnosis not present

## 2018-03-02 DIAGNOSIS — I509 Heart failure, unspecified: Secondary | ICD-10-CM

## 2018-03-02 DIAGNOSIS — I10 Essential (primary) hypertension: Secondary | ICD-10-CM | POA: Diagnosis not present

## 2018-03-02 DIAGNOSIS — Z7982 Long term (current) use of aspirin: Secondary | ICD-10-CM

## 2018-03-02 DIAGNOSIS — I110 Hypertensive heart disease with heart failure: Secondary | ICD-10-CM

## 2018-03-02 DIAGNOSIS — Z7409 Other reduced mobility: Secondary | ICD-10-CM | POA: Diagnosis not present

## 2018-03-02 DIAGNOSIS — I5033 Acute on chronic diastolic (congestive) heart failure: Secondary | ICD-10-CM | POA: Diagnosis not present

## 2018-03-02 DIAGNOSIS — R739 Hyperglycemia, unspecified: Secondary | ICD-10-CM | POA: Diagnosis not present

## 2018-03-02 DIAGNOSIS — N3281 Overactive bladder: Secondary | ICD-10-CM | POA: Diagnosis not present

## 2018-03-02 DIAGNOSIS — M810 Age-related osteoporosis without current pathological fracture: Secondary | ICD-10-CM | POA: Diagnosis not present

## 2018-03-02 DIAGNOSIS — Z6831 Body mass index (BMI) 31.0-31.9, adult: Secondary | ICD-10-CM

## 2018-03-02 DIAGNOSIS — E669 Obesity, unspecified: Secondary | ICD-10-CM | POA: Diagnosis not present

## 2018-03-02 DIAGNOSIS — Z9181 History of falling: Secondary | ICD-10-CM

## 2018-03-02 DIAGNOSIS — C3490 Malignant neoplasm of unspecified part of unspecified bronchus or lung: Secondary | ICD-10-CM | POA: Diagnosis not present

## 2018-03-03 DIAGNOSIS — E785 Hyperlipidemia, unspecified: Secondary | ICD-10-CM | POA: Diagnosis not present

## 2018-03-03 DIAGNOSIS — M810 Age-related osteoporosis without current pathological fracture: Secondary | ICD-10-CM | POA: Diagnosis not present

## 2018-03-03 DIAGNOSIS — I509 Heart failure, unspecified: Secondary | ICD-10-CM | POA: Diagnosis not present

## 2018-03-03 DIAGNOSIS — N3281 Overactive bladder: Secondary | ICD-10-CM | POA: Diagnosis not present

## 2018-03-03 DIAGNOSIS — J449 Chronic obstructive pulmonary disease, unspecified: Secondary | ICD-10-CM | POA: Diagnosis not present

## 2018-03-03 DIAGNOSIS — I110 Hypertensive heart disease with heart failure: Secondary | ICD-10-CM | POA: Diagnosis not present

## 2018-03-09 DIAGNOSIS — I110 Hypertensive heart disease with heart failure: Secondary | ICD-10-CM | POA: Diagnosis not present

## 2018-03-09 DIAGNOSIS — N3281 Overactive bladder: Secondary | ICD-10-CM | POA: Diagnosis not present

## 2018-03-09 DIAGNOSIS — M810 Age-related osteoporosis without current pathological fracture: Secondary | ICD-10-CM | POA: Diagnosis not present

## 2018-03-09 DIAGNOSIS — I509 Heart failure, unspecified: Secondary | ICD-10-CM | POA: Diagnosis not present

## 2018-03-09 DIAGNOSIS — J449 Chronic obstructive pulmonary disease, unspecified: Secondary | ICD-10-CM | POA: Diagnosis not present

## 2018-03-09 DIAGNOSIS — E785 Hyperlipidemia, unspecified: Secondary | ICD-10-CM | POA: Diagnosis not present

## 2018-03-11 DIAGNOSIS — M810 Age-related osteoporosis without current pathological fracture: Secondary | ICD-10-CM | POA: Diagnosis not present

## 2018-03-11 DIAGNOSIS — J449 Chronic obstructive pulmonary disease, unspecified: Secondary | ICD-10-CM | POA: Diagnosis not present

## 2018-03-11 DIAGNOSIS — I509 Heart failure, unspecified: Secondary | ICD-10-CM | POA: Diagnosis not present

## 2018-03-11 DIAGNOSIS — E785 Hyperlipidemia, unspecified: Secondary | ICD-10-CM | POA: Diagnosis not present

## 2018-03-11 DIAGNOSIS — I110 Hypertensive heart disease with heart failure: Secondary | ICD-10-CM | POA: Diagnosis not present

## 2018-03-11 DIAGNOSIS — N3281 Overactive bladder: Secondary | ICD-10-CM | POA: Diagnosis not present

## 2018-03-14 DIAGNOSIS — W19XXXA Unspecified fall, initial encounter: Secondary | ICD-10-CM | POA: Diagnosis not present

## 2018-03-14 DIAGNOSIS — R404 Transient alteration of awareness: Secondary | ICD-10-CM | POA: Diagnosis not present

## 2018-03-14 DIAGNOSIS — S80919A Unspecified superficial injury of unspecified knee, initial encounter: Secondary | ICD-10-CM | POA: Diagnosis not present

## 2018-03-14 DIAGNOSIS — R410 Disorientation, unspecified: Secondary | ICD-10-CM | POA: Diagnosis not present

## 2018-03-14 DIAGNOSIS — R0902 Hypoxemia: Secondary | ICD-10-CM | POA: Diagnosis not present

## 2018-03-15 ENCOUNTER — Other Ambulatory Visit: Payer: Self-pay

## 2018-03-15 ENCOUNTER — Inpatient Hospital Stay (HOSPITAL_COMMUNITY)
Admission: EM | Admit: 2018-03-15 | Discharge: 2018-03-18 | DRG: 481 | Disposition: A | Discharge status: Skilled Nursing Facility | Payer: Medicare HMO | Attending: Internal Medicine | Admitting: Internal Medicine

## 2018-03-15 ENCOUNTER — Inpatient Hospital Stay (HOSPITAL_COMMUNITY): Payer: Medicare HMO

## 2018-03-15 ENCOUNTER — Emergency Department (HOSPITAL_COMMUNITY): Payer: Medicare HMO

## 2018-03-15 ENCOUNTER — Encounter (HOSPITAL_COMMUNITY): Payer: Self-pay | Admitting: Emergency Medicine

## 2018-03-15 ENCOUNTER — Telehealth: Payer: Self-pay | Admitting: Pediatrics

## 2018-03-15 DIAGNOSIS — R739 Hyperglycemia, unspecified: Secondary | ICD-10-CM | POA: Diagnosis present

## 2018-03-15 DIAGNOSIS — R05 Cough: Secondary | ICD-10-CM | POA: Diagnosis not present

## 2018-03-15 DIAGNOSIS — I5032 Chronic diastolic (congestive) heart failure: Secondary | ICD-10-CM | POA: Diagnosis not present

## 2018-03-15 DIAGNOSIS — Z419 Encounter for procedure for purposes other than remedying health state, unspecified: Secondary | ICD-10-CM

## 2018-03-15 DIAGNOSIS — Z01810 Encounter for preprocedural cardiovascular examination: Secondary | ICD-10-CM

## 2018-03-15 DIAGNOSIS — S79911A Unspecified injury of right hip, initial encounter: Secondary | ICD-10-CM | POA: Diagnosis not present

## 2018-03-15 DIAGNOSIS — S72451A Displaced supracondylar fracture without intracondylar extension of lower end of right femur, initial encounter for closed fracture: Principal | ICD-10-CM | POA: Diagnosis present

## 2018-03-15 DIAGNOSIS — F0150 Vascular dementia without behavioral disturbance: Secondary | ICD-10-CM | POA: Diagnosis not present

## 2018-03-15 DIAGNOSIS — M25562 Pain in left knee: Secondary | ICD-10-CM | POA: Diagnosis not present

## 2018-03-15 DIAGNOSIS — S72409A Unspecified fracture of lower end of unspecified femur, initial encounter for closed fracture: Secondary | ICD-10-CM | POA: Diagnosis not present

## 2018-03-15 DIAGNOSIS — J449 Chronic obstructive pulmonary disease, unspecified: Secondary | ICD-10-CM | POA: Diagnosis present

## 2018-03-15 DIAGNOSIS — E669 Obesity, unspecified: Secondary | ICD-10-CM | POA: Diagnosis present

## 2018-03-15 DIAGNOSIS — W19XXXA Unspecified fall, initial encounter: Secondary | ICD-10-CM

## 2018-03-15 DIAGNOSIS — I10 Essential (primary) hypertension: Secondary | ICD-10-CM | POA: Diagnosis not present

## 2018-03-15 DIAGNOSIS — S79929A Unspecified injury of unspecified thigh, initial encounter: Secondary | ICD-10-CM | POA: Diagnosis not present

## 2018-03-15 DIAGNOSIS — S7291XA Unspecified fracture of right femur, initial encounter for closed fracture: Secondary | ICD-10-CM | POA: Diagnosis present

## 2018-03-15 DIAGNOSIS — M6281 Muscle weakness (generalized): Secondary | ICD-10-CM | POA: Diagnosis not present

## 2018-03-15 DIAGNOSIS — Z7401 Bed confinement status: Secondary | ICD-10-CM | POA: Diagnosis not present

## 2018-03-15 DIAGNOSIS — W010XXA Fall on same level from slipping, tripping and stumbling without subsequent striking against object, initial encounter: Secondary | ICD-10-CM | POA: Diagnosis present

## 2018-03-15 DIAGNOSIS — E785 Hyperlipidemia, unspecified: Secondary | ICD-10-CM | POA: Diagnosis not present

## 2018-03-15 DIAGNOSIS — G8918 Other acute postprocedural pain: Secondary | ICD-10-CM | POA: Diagnosis not present

## 2018-03-15 DIAGNOSIS — S72401D Unspecified fracture of lower end of right femur, subsequent encounter for closed fracture with routine healing: Secondary | ICD-10-CM | POA: Diagnosis not present

## 2018-03-15 DIAGNOSIS — S72301A Unspecified fracture of shaft of right femur, initial encounter for closed fracture: Secondary | ICD-10-CM | POA: Diagnosis present

## 2018-03-15 DIAGNOSIS — Z23 Encounter for immunization: Secondary | ICD-10-CM | POA: Diagnosis not present

## 2018-03-15 DIAGNOSIS — Z7982 Long term (current) use of aspirin: Secondary | ICD-10-CM | POA: Diagnosis not present

## 2018-03-15 DIAGNOSIS — S72431A Displaced fracture of medial condyle of right femur, initial encounter for closed fracture: Secondary | ICD-10-CM | POA: Diagnosis not present

## 2018-03-15 DIAGNOSIS — E871 Hypo-osmolality and hyponatremia: Secondary | ICD-10-CM | POA: Diagnosis present

## 2018-03-15 DIAGNOSIS — J310 Chronic rhinitis: Secondary | ICD-10-CM | POA: Diagnosis present

## 2018-03-15 DIAGNOSIS — R0902 Hypoxemia: Secondary | ICD-10-CM | POA: Diagnosis not present

## 2018-03-15 DIAGNOSIS — S72331A Displaced oblique fracture of shaft of right femur, initial encounter for closed fracture: Secondary | ICD-10-CM | POA: Diagnosis not present

## 2018-03-15 DIAGNOSIS — Z6836 Body mass index (BMI) 36.0-36.9, adult: Secondary | ICD-10-CM

## 2018-03-15 DIAGNOSIS — D62 Acute posthemorrhagic anemia: Secondary | ICD-10-CM | POA: Diagnosis not present

## 2018-03-15 DIAGNOSIS — R2689 Other abnormalities of gait and mobility: Secondary | ICD-10-CM | POA: Diagnosis not present

## 2018-03-15 DIAGNOSIS — M810 Age-related osteoporosis without current pathological fracture: Secondary | ICD-10-CM | POA: Diagnosis present

## 2018-03-15 DIAGNOSIS — F0390 Unspecified dementia without behavioral disturbance: Secondary | ICD-10-CM | POA: Diagnosis not present

## 2018-03-15 DIAGNOSIS — R41841 Cognitive communication deficit: Secondary | ICD-10-CM | POA: Diagnosis not present

## 2018-03-15 DIAGNOSIS — D649 Anemia, unspecified: Secondary | ICD-10-CM | POA: Diagnosis not present

## 2018-03-15 DIAGNOSIS — S7290XA Unspecified fracture of unspecified femur, initial encounter for closed fracture: Secondary | ICD-10-CM

## 2018-03-15 DIAGNOSIS — Z79899 Other long term (current) drug therapy: Secondary | ICD-10-CM

## 2018-03-15 DIAGNOSIS — S299XXA Unspecified injury of thorax, initial encounter: Secondary | ICD-10-CM | POA: Diagnosis not present

## 2018-03-15 DIAGNOSIS — I5033 Acute on chronic diastolic (congestive) heart failure: Secondary | ICD-10-CM | POA: Diagnosis not present

## 2018-03-15 DIAGNOSIS — I110 Hypertensive heart disease with heart failure: Secondary | ICD-10-CM | POA: Diagnosis not present

## 2018-03-15 DIAGNOSIS — E860 Dehydration: Secondary | ICD-10-CM | POA: Diagnosis present

## 2018-03-15 DIAGNOSIS — Y92009 Unspecified place in unspecified non-institutional (private) residence as the place of occurrence of the external cause: Secondary | ICD-10-CM | POA: Diagnosis not present

## 2018-03-15 DIAGNOSIS — M25551 Pain in right hip: Secondary | ICD-10-CM | POA: Diagnosis not present

## 2018-03-15 DIAGNOSIS — R262 Difficulty in walking, not elsewhere classified: Secondary | ICD-10-CM | POA: Diagnosis not present

## 2018-03-15 DIAGNOSIS — S72401A Unspecified fracture of lower end of right femur, initial encounter for closed fracture: Secondary | ICD-10-CM | POA: Diagnosis not present

## 2018-03-15 DIAGNOSIS — R059 Cough, unspecified: Secondary | ICD-10-CM

## 2018-03-15 DIAGNOSIS — S8992XA Unspecified injury of left lower leg, initial encounter: Secondary | ICD-10-CM | POA: Diagnosis not present

## 2018-03-15 DIAGNOSIS — R0602 Shortness of breath: Secondary | ICD-10-CM | POA: Diagnosis not present

## 2018-03-15 DIAGNOSIS — M2550 Pain in unspecified joint: Secondary | ICD-10-CM | POA: Diagnosis not present

## 2018-03-15 HISTORY — DX: Wedge compression fracture of unspecified lumbar vertebra, initial encounter for closed fracture: S32.000A

## 2018-03-15 HISTORY — DX: Heart failure, unspecified: I50.9

## 2018-03-15 HISTORY — DX: Unspecified osteoarthritis, unspecified site: M19.90

## 2018-03-15 HISTORY — DX: Unspecified chronic bronchitis: J42

## 2018-03-15 LAB — CBC WITH DIFFERENTIAL/PLATELET
BASOS ABS: 0 10*3/uL (ref 0.0–0.1)
BASOS PCT: 0 %
Eosinophils Absolute: 0 10*3/uL (ref 0.0–0.7)
Eosinophils Relative: 0 %
HEMATOCRIT: 35.1 % — AB (ref 36.0–46.0)
Hemoglobin: 11.8 g/dL — ABNORMAL LOW (ref 12.0–15.0)
Lymphocytes Relative: 8 %
Lymphs Abs: 0.8 10*3/uL (ref 0.7–4.0)
MCH: 31.1 pg (ref 26.0–34.0)
MCHC: 33.6 g/dL (ref 30.0–36.0)
MCV: 92.4 fL (ref 78.0–100.0)
MONO ABS: 0.9 10*3/uL (ref 0.1–1.0)
Monocytes Relative: 9 %
NEUTROS ABS: 8.5 10*3/uL — AB (ref 1.7–7.7)
NEUTROS PCT: 83 %
Platelets: 226 10*3/uL (ref 150–400)
RBC: 3.8 MIL/uL — ABNORMAL LOW (ref 3.87–5.11)
RDW: 13.5 % (ref 11.5–15.5)
WBC: 10.2 10*3/uL (ref 4.0–10.5)

## 2018-03-15 LAB — BASIC METABOLIC PANEL
ANION GAP: 7 (ref 5–15)
BUN: 12 mg/dL (ref 8–23)
CALCIUM: 8.2 mg/dL — AB (ref 8.9–10.3)
CO2: 27 mmol/L (ref 22–32)
Chloride: 95 mmol/L — ABNORMAL LOW (ref 98–111)
Creatinine, Ser: 0.66 mg/dL (ref 0.44–1.00)
Glucose, Bld: 129 mg/dL — ABNORMAL HIGH (ref 70–99)
Potassium: 3.7 mmol/L (ref 3.5–5.1)
SODIUM: 129 mmol/L — AB (ref 135–145)

## 2018-03-15 LAB — PROTIME-INR
INR: 1.07
PROTHROMBIN TIME: 13.8 s (ref 11.4–15.2)

## 2018-03-15 MED ORDER — HYDROCODONE-ACETAMINOPHEN 5-325 MG PO TABS
1.0000 | ORAL_TABLET | Freq: Once | ORAL | Status: AC
  Administered 2018-03-15: 1 via ORAL
  Filled 2018-03-15: qty 1

## 2018-03-15 MED ORDER — FLUTICASONE PROPIONATE 50 MCG/ACT NA SUSP
1.0000 | Freq: Every day | NASAL | Status: DC
  Administered 2018-03-15 – 2018-03-18 (×4): 1 via NASAL
  Filled 2018-03-15: qty 16

## 2018-03-15 MED ORDER — ASPIRIN EC 81 MG PO TBEC
81.0000 mg | DELAYED_RELEASE_TABLET | Freq: Every day | ORAL | Status: DC
  Administered 2018-03-15 – 2018-03-18 (×4): 81 mg via ORAL
  Filled 2018-03-15 (×4): qty 1

## 2018-03-15 MED ORDER — ONDANSETRON HCL 4 MG PO TABS: 4.0000 mg | ORAL_TABLET | Freq: Four times a day (QID) | ORAL | Status: DC | PRN

## 2018-03-15 MED ORDER — CEFAZOLIN SODIUM-DEXTROSE 2-4 GM/100ML-% IV SOLN
2.0000 g | INTRAVENOUS | Status: DC
  Filled 2018-03-15: qty 100

## 2018-03-15 MED ORDER — SODIUM CHLORIDE 0.9 % IV SOLN
INTRAVENOUS | Status: AC
  Administered 2018-03-15: 08:00:00 via INTRAVENOUS

## 2018-03-15 MED ORDER — AZELASTINE HCL 0.1 % NA SOLN
1.0000 | Freq: Two times a day (BID) | NASAL | Status: DC
  Administered 2018-03-15 – 2018-03-18 (×5): 1 via NASAL
  Filled 2018-03-15 (×2): qty 30

## 2018-03-15 MED ORDER — CHLORHEXIDINE GLUCONATE 4 % EX LIQD
60.0000 mL | Freq: Once | CUTANEOUS | Status: AC
  Administered 2018-03-16: 4 via TOPICAL
  Filled 2018-03-15: qty 60

## 2018-03-15 MED ORDER — LOSARTAN POTASSIUM 50 MG PO TABS
100.0000 mg | ORAL_TABLET | Freq: Every day | ORAL | Status: DC
  Administered 2018-03-15: 100 mg via ORAL
  Administered 2018-03-16: 50 mg via ORAL
  Administered 2018-03-17 – 2018-03-18 (×2): 100 mg via ORAL
  Filled 2018-03-15 (×4): qty 2

## 2018-03-15 MED ORDER — ADULT MULTIVITAMIN W/MINERALS CH
1.0000 | ORAL_TABLET | Freq: Every day | ORAL | Status: DC
  Administered 2018-03-15 – 2018-03-18 (×4): 1 via ORAL
  Filled 2018-03-15 (×5): qty 1

## 2018-03-15 MED ORDER — OXYCODONE HCL 5 MG PO TABS
5.0000 mg | ORAL_TABLET | Freq: Four times a day (QID) | ORAL | Status: DC | PRN
  Administered 2018-03-15 – 2018-03-17 (×3): 5 mg via ORAL
  Filled 2018-03-15 (×4): qty 1

## 2018-03-15 MED ORDER — ONDANSETRON HCL 4 MG/2ML IJ SOLN: 4.0000 mg | Freq: Four times a day (QID) | INTRAMUSCULAR | Status: DC | PRN

## 2018-03-15 MED ORDER — MUPIROCIN 2 % EX OINT
1.0000 "application " | TOPICAL_OINTMENT | Freq: Two times a day (BID) | CUTANEOUS | Status: AC
  Administered 2018-03-17: 1 via NASAL
  Filled 2018-03-15: qty 22

## 2018-03-15 MED ORDER — FLUTICASONE FUROATE-VILANTEROL 100-25 MCG/INH IN AEPB
1.0000 | INHALATION_SPRAY | Freq: Every day | RESPIRATORY_TRACT | Status: DC
  Administered 2018-03-18: 1 via RESPIRATORY_TRACT
  Filled 2018-03-15: qty 28

## 2018-03-15 MED ORDER — MORPHINE SULFATE (PF) 2 MG/ML IV SOLN
2.0000 mg | INTRAVENOUS | Status: DC | PRN
  Administered 2018-03-15: 2 mg via INTRAVENOUS
  Filled 2018-03-15: qty 1

## 2018-03-15 MED ORDER — POVIDONE-IODINE 10 % EX SWAB: 2.0000 "application " | Freq: Once | CUTANEOUS | Status: DC

## 2018-03-15 MED ORDER — CALCIUM CARBONATE-VITAMIN D 500-200 MG-UNIT PO TABS
1.0000 | ORAL_TABLET | Freq: Two times a day (BID) | ORAL | Status: DC
  Administered 2018-03-15 – 2018-03-18 (×6): 1 via ORAL
  Filled 2018-03-15 (×7): qty 1

## 2018-03-15 MED ORDER — TIOTROPIUM BROMIDE MONOHYDRATE 18 MCG IN CAPS
18.0000 ug | ORAL_CAPSULE | Freq: Every day | RESPIRATORY_TRACT | Status: DC
  Administered 2018-03-18: 18 ug via RESPIRATORY_TRACT
  Filled 2018-03-15: qty 5

## 2018-03-15 MED ORDER — KETOROLAC TROMETHAMINE 15 MG/ML IJ SOLN
15.0000 mg | Freq: Once | INTRAMUSCULAR | Status: AC
  Administered 2018-03-15: 15 mg via INTRAVENOUS
  Filled 2018-03-15: qty 1

## 2018-03-15 NOTE — ED Provider Notes (Signed)
[facility] EMERGENCY DEPARTMENT Provider Note   CSN: [id number] Arrival date & time: 03/15/18  0113     History   Chief Complaint Chief Complaint  Patient presents with  . Fall    HPI Carrie Crawford is a 84 y.o. female.  Patient presents via EMS after falling while attempting to get to the bathroom.  States she was trying to get out of bed to go to the bathroom using her walker when she somehow lost her balance and fell onto her bilateral knees.  Complains of right knee pain and swelling.  Denies hitting her head.  Denies any neck or back pain.  Denies any chest pain or abdominal pain.  Denies any hip pain.  She has not tried to walk since the incident.  She does not think that she takes any blood thinners.  The history is provided by the patient and the EMS personnel.  Fall  Pertinent negatives include no chest pain, no abdominal pain, no headaches and no shortness of breath.    Past Medical History:  Diagnosis Date  . COPD (chronic obstructive pulmonary disease) (HCC)    chronic bronchitis  . Dyslipidemia   . Hypertension   . Multiple allergies   . Osteopenia   . Rhinitis, chronic     Patient Active Problem List   Diagnosis Date Noted  . Hyperglycemia 04/29/2017  . Obesity (BMI 30-39.9) 02/17/2017  . OAB (overactive bladder) 02/16/2017  . Age-related osteoporosis with current pathological fracture 04/23/2016  . Estrogen deficiency 04/23/2016  . Fracture of posterior thoracic vertebral body with routine healing 04/23/2016  . Diastolic dysfunction with acute on chronic heart failure ([place]) 02/17/2016  . Routine general medical examination at a health care facility 04/23/2015  . Hyperlipidemia with target LDL less than 130   . Hypertension 04/26/2013  . Rhinitis, chronic 01/16/2011  . Obstructive chronic bronchitis without exacerbation ([place]) 01/16/2011    Past Surgical History:  Procedure Laterality Date  . CHOLECYSTECTOMY    . THYROIDECTOMY, PARTIAL  2004   [facility]     OB History   None      Home Medications    Prior to Admission medications   Medication Sig Start Date End Date Taking? Authorizing Provider  aspirin 81 MG tablet Take 81 mg by mouth daily.      [provider]  azelastine (ASTELIN) 0.1 % nasal spray 2 sprays in each nostril at bedtime. 09/16/17   Eustaquio Maize, MD  Calcium Carbonate-Vitamin D 600-400 MG-UNIT tablet Take 1 tablet by mouth 2 (two) times daily. 04/14/16   Nche, Charlene Brooke, NP  fluticasone (FLONASE) 50 MCG/ACT nasal spray 2 SPRAYS IN EACH NOSTRIL QD 09/16/17   Eustaquio Maize, MD  fluticasone furoate-vilanterol (BREO ELLIPTA) 100-25 MCG/INH AEPB INHALE 1 PUFF ONCE DAILY AS DIRECTED 09/10/17   Eustaquio Maize, MD  furosemide (LASIX) 20 MG tablet Take 1 tablet (20 mg total) by mouth daily. 11/08/17   Eustaquio Maize, MD  losartan (COZAAR) 100 MG tablet Take 1 tablet (100 mg total) by mouth daily. 09/10/17   Eustaquio Maize, MD  Multiple Vitamins-Minerals (MULTIVITAMIN WITH MINERALS) tablet Take 1 tablet by mouth daily.    [provider]  tiotropium (SPIRIVA HANDIHALER) 18 MCG inhalation capsule INHALE THE CONTENTS OF ONE CAPSULE ONCE DAILY 09/10/17   Eustaquio Maize, MD    Family History Family History  Problem Relation Age of Onset  . Heart disease Brother   . Cancer Daughter  endometial/uterine cancer(nodule in right lung)    Social History Social History   Tobacco Use  . Smoking status: Never Smoker  . Smokeless tobacco: Never Used  Substance Use Topics  . Alcohol use: No    Alcohol/week: 0.0 standard drinks  . Drug use: No     Allergies   Patient has no known allergies.   Review of Systems Review of Systems  Constitutional: Negative for activity change, appetite change and fever.  HENT: Negative for congestion.   Eyes: Negative for visual disturbance.  Respiratory: Negative for chest tightness and shortness of breath.   Cardiovascular: Negative for chest pain.    Gastrointestinal: Negative for abdominal pain, nausea and vomiting.  Genitourinary: Negative for dysuria and frequency.  Musculoskeletal: Positive for arthralgias and myalgias. Negative for back pain and neck pain.  Neurological: Negative for dizziness, weakness, light-headedness and headaches.   all other systems are negative except as noted in the HPI and PMH.     Physical Exam Updated Vital Signs BP (!) 181/76 (BP Location: Left Arm)   Pulse 80   Temp 98.2 F (36.8 C) (Oral)   Resp 20   Ht 5\' 2"  (1.575 m)   Wt 90.7 kg   SpO2 94%   BMI 36.58 kg/m   Physical Exam  Constitutional: She is oriented to person, place, and time. She appears well-developed and well-nourished. No distress.  HENT:  Head: Normocephalic and atraumatic.  Mouth/Throat: Oropharynx is clear and moist. No oropharyngeal exudate.  Eyes: Pupils are equal, round, and reactive to light. Conjunctivae and EOM are normal.  Neck: Normal range of motion. Neck supple.  No meningismus.  Cardiovascular: Normal rate, regular rhythm, normal heart sounds and intact distal pulses.  No murmur heard. Pulmonary/Chest: Effort normal and breath sounds normal. No respiratory distress. She exhibits no tenderness.  Abdominal: Soft. There is no tenderness. There is no rebound and no guarding.  Musculoskeletal: She exhibits edema and tenderness.  Right knee held in slightly flexed position.  States she is not able to extend it completely.  There is diffuse tenderness and swelling. Intact DP and PT pulse.  Ecchymosis to left knee but nontender.  Full range of motion of left hip and left knee.  Neurological: She is alert and oriented to person, place, and time. No cranial nerve deficit. She exhibits normal muscle tone. Coordination normal.  No ataxia on finger to nose bilaterally. No pronator drift. 5/5 strength throughout. CN 2-12 intact.Equal grip strength. Sensation intact.   Skin: Skin is warm. Capillary refill takes less than 2  seconds.  Psychiatric: She has a normal mood and affect. Her behavior is normal.  Nursing note and vitals reviewed.    ED Treatments / Results  Labs (all labs ordered are listed, but only abnormal results are displayed) Labs Reviewed  CBC WITH DIFFERENTIAL/PLATELET - Abnormal; Notable for the following components:      Result Value   RBC 3.80 (*)    Hemoglobin 11.8 (*)    HCT 35.1 (*)    Neutro Abs 8.5 (*)    All other components within normal limits  BASIC METABOLIC PANEL - Abnormal; Notable for the following components:   Sodium 129 (*)    Chloride 95 (*)    Glucose, Bld 129 (*)    Calcium 8.2 (*)    All other components within normal limits  PROTIME-INR    EKG EKG Interpretation  Date/Time:  Tuesday March 15 2018 04:45:10 EDT Ventricular Rate:  84 PR Interval:  QRS Duration: 96 QT Interval:  375 QTC Calculation: 444 R Axis:   -24 Text Interpretation:  Sinus rhythm Borderline left axis deviation No significant change was found Confirmed by Ezequiel Essex [phone]) on 03/15/2018 5:12:31 AM   Radiology Dg Knee Complete 4 Views Left  Result Date: 03/15/2018 CLINICAL DATA:  Left knee pain after fall EXAM: LEFT KNEE - COMPLETE 4+ VIEW COMPARISON:  None. FINDINGS: No acute fracture nor joint dislocation. Subchondral areas of osteopenia involving the weight-bearing portion of the medial femoral condyle may reflect chondral fissuring and/or thinning. Degenerative spurring is noted of the tibial spines. No joint effusion. IMPRESSION: Mild femorotibial joint osteoarthritis.  No acute fracture. Electronically Signed   By: [name] Royalty M.D.   On: 03/15/2018 03:43   Dg Knee Complete 4 Views Right  Result Date: 03/15/2018 CLINICAL DATA:  Patient fell today and has right knee pain. EXAM: RIGHT KNEE - COMPLETE 4+ VIEW COMPARISON:  None. FINDINGS: Acute oblique displaced closed fracture of the distal right femoral diaphysis extending into the metaphysis. There is of the 16 mm of  medial displacement of the femoral condyles and distal fracture fragment relative to the femoral diaphysis. No intra-articular extension is identified. Osteoarthritic joint space narrowing of the femorotibial compartment is seen. The patellofemoral appears intact. No joint effusion. IMPRESSION: Acute, closed, oblique fracture of the distal femoral diaphysis exiting out the lateral femoral metaphysis and with resultant 16 mm of medial displacement of the femoral condyles and distal fracture fragment. Electronically Signed   By: [name] Royalty M.D.   On: 03/15/2018 03:41   Dg Hip Unilat W Or Wo Pelvis 2-3 Views Right  Result Date: 03/15/2018 CLINICAL DATA:  Right hip pain after fall today. EXAM: DG HIP (WITH OR WITHOUT PELVIS) 2-3V RIGHT COMPARISON:  None. FINDINGS: AP view the pelvis demonstrates degenerative disc disease of the included lower lumbar spine with spurring off the endplates from L2 through S1. Osteoarthritis of the SI joints and pubic symphysis without diastasis. No acute pelvic fracture. Left greater than right joint space narrowing of the hips without fracture or joint dislocation. Femoral heads are spherical in appearance without flattening. IMPRESSION: Lower lumbar degenerative disc facet arthropathy. No acute pelvic or hip fracture. Osteoarthritis of the SI joints, pubic symphysis and left greater than right hip joint space narrowing. Electronically Signed   By: [name] Royalty M.D.   On: 03/15/2018 03:45   Dg Femur Min 2 Views Right  Result Date: 03/15/2018 CLINICAL DATA:  Pain after fall. EXAM: RIGHT FEMUR 2 VIEWS COMPARISON:  None. FINDINGS: Acute, closed, oblique fracture of the right distal femoral diaphysis exiting the lateral femoral metaphysis. Resultant medial displacement of the distal fracture fragment which includes both femoral condyles at least 1/2 shaft width. Osteoarthritis of the femorotibial compartment with spurring. No intra-articular extension of fracture. No joint effusion.  IMPRESSION: Acute oblique distal femoral diaphyseal fracture exiting the lateral femoral metaphysis with resultant medial displacement of the distal fracture fragment by 1/2 shaft width. Osteoarthritic joint space narrowing of the femorotibial compartment. Electronically Signed   By: [name] Royalty M.D.   On: 03/15/2018 03:47    Procedures Procedures (including critical care time)  Medications Ordered in ED Medications  HYDROcodone-acetaminophen (NORCO/VICODIN) 5-325 MG per tablet 1 tablet (has no administration in time range)     Initial Impression / Assessment and Plan / ED Course  I have reviewed the triage vital signs and the nursing notes.  Pertinent labs & imaging results that were available during my care  of the patient were reviewed by me and considered in my medical decision making (see chart for details).    Mechanical fall with right knee pain.  No head or neck injury.  No anticoagulation.  Only complaint is right knee pain.  X-ray shows distal femur fracture on the right.  Compartments are soft.  Intact DP and PT pulses.  D/w Dr. Doreatha Martin who recommends knee immobilizer and hospitalist admission at [facility].   Labs sent.  Mild hyponatremia and anemia noted.  Patient hydrated gently.  She denies any head or neck pain.  Her pain is controlled.  The immobilizer in place on right leg.  Admission discussed with Dr. Olevia Bowens who will arrange transfer to [facility]. Final Clinical Impressions(s) / ED Diagnoses   Final diagnoses:  Closed fracture of distal end of femur, unspecified fracture morphology, initial encounter [facility])  Fall, initial encounter    ED Discharge Orders    None       Jasma Seevers, Annie Main, MD 03/15/18 0700

## 2018-03-15 NOTE — H&P (Signed)
History and Physical    Carrie Crawford:[id number] DOB: 12-23-1925 DOA: 03/15/2018  PCP: Eustaquio Maize, MD   Patient coming from: Home.  I have personally briefly reviewed patient's old medical records in [place]  Chief Complaint: Fall.  HPI: Carrie Crawford is a 84 y.o. female with medical history significant of COPD, hyperlipidemia, hypertension, multiple environmental allergies, osteopenia, chronic rhinitis who is brought to the emergency department via EMS after having a mechanical fall at home while she was trying to get to the bathroom.  She denies fever, chills, sore throat, worsening dyspnea, chest pain, palpitations, dizziness, diaphoresis, PND orthopnea.  No abdominal pain, nausea, emesis, diarrhea, constipation, melena or hematochezia.  Denies dysuria, frequency or hematuria.  No polyuria, polydipsia or blurred vision.  ED Course: Initial vital signs temperature 98.2 F, pulse 94, respirations 20, blood pressure 181/76 mmHg and O2 sat 94% on room air.  She was given a tablet of hydrocodone 5 mg with acetaminophen 325 mg p.o. x1.  She states that her pain is much better.  Work-up shows a white count of 10.2, hemoglobin 11.8 and platelets 226.  PT was 13.8 and INR 1.07.  Her sodium 129, potassium 3.7, chloride 95 and CO2 27 mmol/L.  BUN was 12, creatinine 0.66, calcium 8.2 and glucose 129 mg/dL.  Left knee radiograph show some osteoarthritis, but no fracture.  Right hip and pelvis showed lower lumbar DDD.  No fracture was seen.  However right knee x-ray shows an acute oblique distal femoral diaphyseal fracture exiting the lateral femoral meta physis with resultant medial displacement of the distal fracture fragment by half a shaft width.  Please see images and full radiology report for further detail.  Review of Systems: As per HPI otherwise 10 point review of systems negative.   Past Medical History:  Diagnosis Date  . COPD (chronic obstructive pulmonary disease)  (HCC)    chronic bronchitis  . Dyslipidemia   . Hypertension   . Multiple allergies   . Osteopenia   . Rhinitis, chronic     Past Surgical History:  Procedure Laterality Date  . CHOLECYSTECTOMY    . THYROIDECTOMY, PARTIAL  2004   [facility]     reports that she has never smoked. She has never used smokeless tobacco. She reports that she does not drink alcohol or use drugs.  No Known Allergies  Family History  Problem Relation Age of Onset  . Heart disease Brother   . Cancer Daughter        endometial/uterine cancer(nodule in right lung)    Prior to Admission medications   Medication Sig Start Date End Date Taking? Authorizing Provider  aspirin 81 MG tablet Take 81 mg by mouth daily.      [provider]  azelastine (ASTELIN) 0.1 % nasal spray 2 sprays in each nostril at bedtime. 09/16/17   Eustaquio Maize, MD  Calcium Carbonate-Vitamin D 600-400 MG-UNIT tablet Take 1 tablet by mouth 2 (two) times daily. 04/14/16   Nche, Charlene Brooke, NP  fluticasone (FLONASE) 50 MCG/ACT nasal spray 2 SPRAYS IN EACH NOSTRIL QD 09/16/17   Eustaquio Maize, MD  fluticasone furoate-vilanterol (BREO ELLIPTA) 100-25 MCG/INH AEPB INHALE 1 PUFF ONCE DAILY AS DIRECTED 09/10/17   Eustaquio Maize, MD  furosemide (LASIX) 20 MG tablet Take 1 tablet (20 mg total) by mouth daily. 11/08/17   Eustaquio Maize, MD  losartan (COZAAR) 100 MG tablet Take 1 tablet (100 mg total) by mouth daily. 09/10/17  Eustaquio Maize, MD  Multiple Vitamins-Minerals (MULTIVITAMIN WITH MINERALS) tablet Take 1 tablet by mouth daily.    [provider]  tiotropium (SPIRIVA HANDIHALER) 18 MCG inhalation capsule INHALE THE CONTENTS OF ONE CAPSULE ONCE DAILY 09/10/17   Eustaquio Maize, MD    Physical Exam: Vitals:   03/15/18 0128 03/15/18 0129 03/15/18 0605  BP: (!) 181/76  (!) 181/86  Pulse: 80  94  Resp: 20  (!) 24  Temp: 98.2 F (36.8 C)    TempSrc: Oral    SpO2: 94%  95%  Weight:  90.7 kg   Height:  5\' 2"  (1.575  m)     Constitutional: NAD, calm, comfortable Eyes: PERRL, lids and conjunctivae normal ENMT: Mucous membranes are moist. Posterior pharynx clear of any exudate or lesions. Neck: normal, supple, no masses, no thyromegaly Respiratory: clear to auscultation bilaterally, no wheezing, no crackles. Normal respiratory effort. No accessory muscle use.  Cardiovascular: Regular rate and rhythm, no murmurs / rubs / gallops. No extremity edema. 2+ pedal pulses. No carotid bruits.  Abdomen: Soft, no tenderness, no masses palpated. No hepatosplenomegaly. Bowel sounds positive.  Musculoskeletal: no clubbing / cyanosis.  Right knee soft cast immobilization in place.  Pulses are palpable.  There is an ecchymotic area on left knee.  However this is not tender.  Good ROM, no contractures. Normal muscle tone.  Skin: no rashes, lesions, ulcers on limited dermatological examination. Neurologic: CN 2-12 grossly intact. Sensation intact, DTR normal. Strength 5/5 in all 4.  Psychiatric: Normal judgment and insight. Alert and oriented x 3. Normal mood.   Labs on Admission: I have personally reviewed following labs and imaging studies  CBC: Recent Labs  Lab 03/15/18 0426  WBC 10.2  NEUTROABS 8.5*  HGB 11.8*  HCT 35.1*  MCV 92.4  PLT 865   Basic Metabolic Panel: Recent Labs  Lab 03/15/18 0426  NA 129*  K 3.7  CL 95*  CO2 27  GLUCOSE 129*  BUN 12  CREATININE 0.66  CALCIUM 8.2*   GFR: Estimated Creatinine Clearance: 49.9 mL/min (by C-G formula based on SCr of 0.66 mg/dL). Liver Function Tests: No results for input(s): AST, ALT, ALKPHOS, BILITOT, PROT, ALBUMIN in the last 168 hours. No results for input(s): LIPASE, AMYLASE in the last 168 hours. No results for input(s): AMMONIA in the last 168 hours. Coagulation Profile: Recent Labs  Lab 03/15/18 0426  INR 1.07   Cardiac Enzymes: No results for input(s): CKTOTAL, CKMB, CKMBINDEX, TROPONINI in the last 168 hours. BNP (last 3 results) No  results for input(s): PROBNP in the last 8760 hours. HbA1C: No results for input(s): HGBA1C in the last 72 hours. CBG: No results for input(s): GLUCAP in the last 168 hours. Lipid Profile: No results for input(s): CHOL, HDL, LDLCALC, TRIG, CHOLHDL, LDLDIRECT in the last 72 hours. Thyroid Function Tests: No results for input(s): TSH, T4TOTAL, FREET4, T3FREE, THYROIDAB in the last 72 hours. Anemia Panel: No results for input(s): VITAMINB12, FOLATE, FERRITIN, TIBC, IRON, RETICCTPCT in the last 72 hours. Urine analysis:    Component Value Date/Time   COLORURINE YELLOW 04/07/2016 [place] 04/07/2016 1525   LABSPEC 1.010 04/07/2016 1525   PHURINE 6.5 04/07/2016 1525   GLUCOSEU NEGATIVE 04/07/2016 1525   HGBUR NEGATIVE 04/07/2016 1525   BILIRUBINUR NEGATIVE 04/07/2016 1525   BILIRUBINUR negative 04/07/2016 1408   KETONESUR NEGATIVE 04/07/2016 1525   PROTEINUR negative 04/07/2016 1408   UROBILINOGEN 0.2 04/07/2016 1525   NITRITE NEGATIVE 04/07/2016 1525  LEUKOCYTESUR NEGATIVE 04/07/2016 1525    Radiological Exams on Admission: Dg Knee Complete 4 Views Left  Result Date: 03/15/2018 CLINICAL DATA:  Left knee pain after fall EXAM: LEFT KNEE - COMPLETE 4+ VIEW COMPARISON:  None. FINDINGS: No acute fracture nor joint dislocation. Subchondral areas of osteopenia involving the weight-bearing portion of the medial femoral condyle may reflect chondral fissuring and/or thinning. Degenerative spurring is noted of the tibial spines. No joint effusion. IMPRESSION: Mild femorotibial joint osteoarthritis.  No acute fracture. Electronically Signed   By: [name] Royalty M.D.   On: 03/15/2018 03:43   Dg Knee Complete 4 Views Right  Result Date: 03/15/2018 CLINICAL DATA:  Patient fell today and has right knee pain. EXAM: RIGHT KNEE - COMPLETE 4+ VIEW COMPARISON:  None. FINDINGS: Acute oblique displaced closed fracture of the distal right femoral diaphysis extending into the metaphysis. There is  of the 16 mm of medial displacement of the femoral condyles and distal fracture fragment relative to the femoral diaphysis. No intra-articular extension is identified. Osteoarthritic joint space narrowing of the femorotibial compartment is seen. The patellofemoral appears intact. No joint effusion. IMPRESSION: Acute, closed, oblique fracture of the distal femoral diaphysis exiting out the lateral femoral metaphysis and with resultant 16 mm of medial displacement of the femoral condyles and distal fracture fragment. Electronically Signed   By: [name] Royalty M.D.   On: 03/15/2018 03:41   Dg Hip Unilat W Or Wo Pelvis 2-3 Views Right  Result Date: 03/15/2018 CLINICAL DATA:  Right hip pain after fall today. EXAM: DG HIP (WITH OR WITHOUT PELVIS) 2-3V RIGHT COMPARISON:  None. FINDINGS: AP view the pelvis demonstrates degenerative disc disease of the included lower lumbar spine with spurring off the endplates from L2 through S1. Osteoarthritis of the SI joints and pubic symphysis without diastasis. No acute pelvic fracture. Left greater than right joint space narrowing of the hips without fracture or joint dislocation. Femoral heads are spherical in appearance without flattening. IMPRESSION: Lower lumbar degenerative disc facet arthropathy. No acute pelvic or hip fracture. Osteoarthritis of the SI joints, pubic symphysis and left greater than right hip joint space narrowing. Electronically Signed   By: [name] Royalty M.D.   On: 03/15/2018 03:45   Dg Femur Min 2 Views Right  Result Date: 03/15/2018 CLINICAL DATA:  Pain after fall. EXAM: RIGHT FEMUR 2 VIEWS COMPARISON:  None. FINDINGS: Acute, closed, oblique fracture of the right distal femoral diaphysis exiting the lateral femoral metaphysis. Resultant medial displacement of the distal fracture fragment which includes both femoral condyles at least 1/2 shaft width. Osteoarthritis of the femorotibial compartment with spurring. No intra-articular extension of fracture. No  joint effusion. IMPRESSION: Acute oblique distal femoral diaphyseal fracture exiting the lateral femoral metaphysis with resultant medial displacement of the distal fracture fragment by 1/2 shaft width. Osteoarthritic joint space narrowing of the femorotibial compartment. Electronically Signed   By: [name] Royalty M.D.   On: 03/15/2018 03:47    EKG: Independently reviewed. Vent. rate 84 BPM PR interval * ms QRS duration 96 ms QT/QTc 375/444 ms P-R-T axes 38 -24 33 Sinus rhythm Borderline left axis deviation  Assessment/Plan Principal Problem:   Right femoral fracture (HCC) Admit to [facility]/inpatient/MedSurg. Gentle IV fluids. Analgesics as needed. Toradol 15 mg IVP x1 dose. Oxycodone 5 mg p.o. every 6 hours as needed. Morphine 2 mg IVP every 2 hours as needed for breakthrough or severe pain. Orthopedic surgery to evaluate.  Active Problems:   Hyponatremia Gentle and time-limited IV hydration.  Follow-up sodium level.    Obstructive chronic bronchitis without exacerbation (HCC) Continue supplemental oxygen. Bronchodilators as needed.    Hypertension Monitor blood pressure. Resume furosemide and losartan once dose has been confirmed by pharmacy.    Hyperlipidemia with target LDL less than 130 Not on medical therapy.    Hyperglycemia Check CBG every 6 hours while n.p.o. Switch to ACE and at bedtime once cleared for oral intake.    Anemia Monitor hematocrit and hemoglobin.   DVT prophylaxis: SCDs preop. Code Status: Full code. Family Communication:  Disposition Plan: Admit to [facility] for orthopedic surgery evaluation. Consults called: Dr. Doreatha Martin was contacted by Dr. Wyvonnia Dusky Admission status: Inpatient/MedSurg.   Reubin Milan MD [facility] Pager [phone].  If 7PM-7AM, please contact night-coverage [email or web address] Password [facility]  03/15/2018, 6:57 AM

## 2018-03-15 NOTE — Consult Note (Signed)
Reason for Consult:Femur fx Referring Physician: P Chan Carrie Crawford is an 84 y.o. female.  HPI: Carrie Crawford was at home and getting out of bed. She didn't have hold of her RW and slipped and fell onto her right side. She had immediate pain and couldn't get up by herself. Her son came and helped and then she was brought to [facility] ED where x-rays showed a distal femur fx. Orthopedic surgery was consulted and she was transferred to [facility] for further care. She c/o localized pain in the area of her fx.  Past Medical History:  Diagnosis Date  . COPD (chronic obstructive pulmonary disease) (HCC)    chronic bronchitis  . Dyslipidemia   . Hypertension   . Multiple allergies   . Osteopenia   . Rhinitis, chronic     Past Surgical History:  Procedure Laterality Date  . CHOLECYSTECTOMY    . THYROIDECTOMY, PARTIAL  2004   [facility]    Family History  Problem Relation Age of Onset  . Heart disease Brother   . Cancer Daughter        endometial/uterine cancer(nodule in right lung)    Social History:  reports that she has never smoked. She has never used smokeless tobacco. She reports that she does not drink alcohol or use drugs.  Allergies: No Known Allergies  Medications: I have reviewed the patient's current medications.  Results for orders placed or performed during the hospital encounter of 03/15/18 (from the past 48 hour(s))  CBC with Differential/Platelet     Status: Abnormal   Collection Time: 03/15/18  4:26 AM  Result Value Ref Range   WBC 10.2 4.0 - 10.5 K/uL   RBC 3.80 (L) 3.87 - 5.11 MIL/uL   Hemoglobin 11.8 (L) 12.0 - 15.0 g/dL   HCT 35.1 (L) 36.0 - 46.0 %   MCV 92.4 78.0 - 100.0 fL   MCH 31.1 26.0 - 34.0 pg   MCHC 33.6 30.0 - 36.0 g/dL   RDW 13.5 11.5 - 15.5 %   Platelets 226 150 - 400 K/uL   Neutrophils Relative % 83 %   Neutro Abs 8.5 (H) 1.7 - 7.7 K/uL   Lymphocytes Relative 8 %   Lymphs Abs 0.8 0.7 - 4.0 K/uL   Monocytes Relative 9 %   Monocytes Absolute 0.9 0.1 - 1.0 K/uL    Eosinophils Relative 0 %   Eosinophils Absolute 0.0 0.0 - 0.7 K/uL   Basophils Relative 0 %   Basophils Absolute 0.0 0.0 - 0.1 K/uL    Comment: Performed at [facility], [address] [place], [place] [address]  Basic metabolic panel     Status: Abnormal   Collection Time: 03/15/18  4:26 AM  Result Value Ref Range   Sodium 129 (L) 135 - 145 mmol/L   Potassium 3.7 3.5 - 5.1 mmol/L   Chloride 95 (L) 98 - 111 mmol/L   CO2 27 22 - 32 mmol/L   Glucose, Bld 129 (H) 70 - 99 mg/dL   BUN 12 8 - 23 mg/dL   Creatinine, Ser 0.66 0.44 - 1.00 mg/dL   Calcium 8.2 (L) 8.9 - 10.3 mg/dL   GFR calc non Af Amer >60 >60 mL/min   GFR calc Af Amer >60 >60 mL/min    Comment: (NOTE) The eGFR has been calculated using the CKD EPI equation. This calculation has not been validated in all clinical situations. eGFR's persistently <60 mL/min signify possible Chronic Kidney Disease.    Anion gap 7 5 -  15    Comment: Performed at [facility], [address] [place], [place] [address]  Protime-INR     Status: None   Collection Time: 03/15/18  4:26 AM  Result Value Ref Range   Prothrombin Time 13.8 11.4 - 15.2 seconds   INR 1.07     Comment: Performed at [facility], [address] [place], [place] [address]    Chest Portable 1 View  Result Date: 03/15/2018 CLINICAL DATA:  Preoperative evaluation, fall this morning, hypertension, COPD EXAM: PORTABLE CHEST 1 VIEW COMPARISON:  Portable exam 0650 hours compared to 02/06/2016 FINDINGS: Enlargement of cardiac silhouette with pulmonary vascular congestion. Atherosclerotic calcification aorta. Bronchitic changes with LEFT basilar opacity question atelectasis versus infiltrate. Minimal RIGHT basilar atelectasis. Upper lungs clear. No gross pleural effusion or pneumothorax. Bones demineralized. IMPRESSION: Enlargement of cardiac silhouette with mild RIGHT basilar atelectasis and either atelectasis or consolidation at LEFT base. Electronically Signed   By: Mark  Boles  M.D.   On: 03/15/2018 07:09   Dg Knee Complete 4 Views Left  Result Date: 03/15/2018 CLINICAL DATA:  Left knee pain after fall EXAM: LEFT KNEE - COMPLETE 4+ VIEW COMPARISON:  None. FINDINGS: No acute fracture nor joint dislocation. Subchondral areas of osteopenia involving the weight-bearing portion of the medial femoral condyle may reflect chondral fissuring and/or thinning. Degenerative spurring is noted of the tibial spines. No joint effusion. IMPRESSION: Mild femorotibial joint osteoarthritis.  No acute fracture. Electronically Signed   By: David  Kwon M.D.   On: 03/15/2018 03:43   Dg Knee Complete 4 Views Right  Result Date: 03/15/2018 CLINICAL DATA:  Patient fell today and has right knee pain. EXAM: RIGHT KNEE - COMPLETE 4+ VIEW COMPARISON:  None. FINDINGS: Acute oblique displaced closed fracture of the distal right femoral diaphysis extending into the metaphysis. There is of the 16 mm of medial displacement of the femoral condyles and distal fracture fragment relative to the femoral diaphysis. No intra-articular extension is identified. Osteoarthritic joint space narrowing of the femorotibial compartment is seen. The patellofemoral appears intact. No joint effusion. IMPRESSION: Acute, closed, oblique fracture of the distal femoral diaphysis exiting out the lateral femoral metaphysis and with resultant 16 mm of medial displacement of the femoral condyles and distal fracture fragment. Electronically Signed   By: David  Kwon M.D.   On: 03/15/2018 03:41   Dg Hip Unilat W Or Wo Pelvis 2-3 Views Right  Result Date: 03/15/2018 CLINICAL DATA:  Right hip pain after fall today. EXAM: DG HIP (WITH OR WITHOUT PELVIS) 2-3V RIGHT COMPARISON:  None. FINDINGS: AP view the pelvis demonstrates degenerative disc disease of the included lower lumbar spine with spurring off the endplates from L2 through S1. Osteoarthritis of the SI joints and pubic symphysis without diastasis. No acute pelvic fracture. Left greater than  right joint space narrowing of the hips without fracture or joint dislocation. Femoral heads are spherical in appearance without flattening. IMPRESSION: Lower lumbar degenerative disc facet arthropathy. No acute pelvic or hip fracture. Osteoarthritis of the SI joints, pubic symphysis and left greater than right hip joint space narrowing. Electronically Signed   By: David  Kwon M.D.   On: 03/15/2018 03:45   Dg Femur Min 2 Views Right  Result Date: 03/15/2018 CLINICAL DATA:  Pain after fall. EXAM: RIGHT FEMUR 2 VIEWS COMPARISON:  None. FINDINGS: Acute, closed, oblique fracture of the right distal femoral diaphysis exiting the lateral femoral metaphysis. Resultant medial displacement of the distal fracture fragment which includes both femoral condyles at least 1/2   shaft width. Osteoarthritis of the femorotibial compartment with spurring. No intra-articular extension of fracture. No joint effusion. IMPRESSION: Acute oblique distal femoral diaphyseal fracture exiting the lateral femoral metaphysis with resultant medial displacement of the distal fracture fragment by 1/2 shaft width. Osteoarthritic joint space narrowing of the femorotibial compartment. Electronically Signed   By: [name] Royalty M.D.   On: 03/15/2018 03:47    Review of Systems  Constitutional: Negative for weight loss.  HENT: Negative for ear discharge, ear pain, hearing loss and tinnitus.   Eyes: Negative for blurred vision, double vision, photophobia and pain.  Respiratory: Negative for cough, sputum production and shortness of breath.   Cardiovascular: Negative for chest pain.  Gastrointestinal: Negative for abdominal pain, nausea and vomiting.  Genitourinary: Negative for dysuria, flank pain, frequency and urgency.  Musculoskeletal: Positive for joint pain (Right knee). Negative for back pain, falls, myalgias and neck pain.  Neurological: Negative for dizziness, tingling, sensory change, focal weakness, loss of consciousness and headaches.   Endo/Heme/Allergies: Does not bruise/bleed easily.  Psychiatric/Behavioral: Negative for depression, memory loss and substance abuse. The patient is not nervous/anxious.    Blood pressure (!) 173/73, pulse 87, temperature 98.2 F (36.8 C), temperature source Oral, resp. rate (!) 21, height 5' 2" (1.575 m), weight 90.7 kg, SpO2 96 %. Physical Exam  Constitutional: She appears well-developed and well-nourished. No distress.  HENT:  Head: Normocephalic and atraumatic.  Eyes: Conjunctivae are normal. Right eye exhibits no discharge. Left eye exhibits no discharge. No scleral icterus.  Neck: Normal range of motion.  Cardiovascular: Normal rate and regular rhythm.  Respiratory: Effort normal. No respiratory distress.  Musculoskeletal:  RLE No traumatic wounds, ecchymosis, or rash  In KI  No ankle effusion  Sens DPN, SPN, TN intact  Motor EHL, ext, flex, evers 5/5  DP 2+, PT 1+, No significant edema  Neurological: She is alert.  Skin: Skin is warm and dry. She is not diaphoretic.  Psychiatric: She has a normal mood and affect. Her behavior is normal.    Assessment/Plan: Right femur fx -- Plan ORIF tomorrow with Dr. Doreatha Martin. NPO after MN. Multiple medical problems including COPD, HLD, HTN, osteopenia, and chronic rhinitis    Lisette Abu, PA-C Orthopedic Surgery [phone] 03/15/2018, 9:34 AM

## 2018-03-15 NOTE — Progress Notes (Signed)
RN called pharmacy and spoke to [facility] and asked to get pt medications verified still haven't been verified. That is why patient has not received any medications yet. RN messaged Pharmacy through [facility] to see if they can verify it.

## 2018-03-15 NOTE — H&P (View-Only) (Signed)
Reason for Consult:Femur fx Referring Physician: P Shah  Carrie Crawford is an 84 y.o. female.  HPI: Carrie Crawford was at home and getting out of bed. She didn't have hold of her RW and slipped and fell onto her right side. She had immediate pain and couldn't get up by herself. Her son came and helped and then she was brought to [facility] ED where x-rays showed a distal femur fx. Orthopedic surgery was consulted and she was transferred to [facility] for further care. She c/o localized pain in the area of her fx.  Past Medical History:  Diagnosis Date  . COPD (chronic obstructive pulmonary disease) (HCC)    chronic bronchitis  . Dyslipidemia   . Hypertension   . Multiple allergies   . Osteopenia   . Rhinitis, chronic     Past Surgical History:  Procedure Laterality Date  . CHOLECYSTECTOMY    . THYROIDECTOMY, PARTIAL  2004   [facility]    Family History  Problem Relation Age of Onset  . Heart disease Brother   . Cancer Daughter        endometial/uterine cancer(nodule in right lung)    Social History:  reports that she has never smoked. She has never used smokeless tobacco. She reports that she does not drink alcohol or use drugs.  Allergies: No Known Allergies  Medications: I have reviewed the patient's current medications.  Results for orders placed or performed during the hospital encounter of 03/15/18 (from the past 48 hour(s))  CBC with Differential/Platelet     Status: Abnormal   Collection Time: 03/15/18  4:26 AM  Result Value Ref Range   WBC 10.2 4.0 - 10.5 K/uL   RBC 3.80 (L) 3.87 - 5.11 MIL/uL   Hemoglobin 11.8 (L) 12.0 - 15.0 g/dL   HCT 35.1 (L) 36.0 - 46.0 %   MCV 92.4 78.0 - 100.0 fL   MCH 31.1 26.0 - 34.0 pg   MCHC 33.6 30.0 - 36.0 g/dL   RDW 13.5 11.5 - 15.5 %   Platelets 226 150 - 400 K/uL   Neutrophils Relative % 83 %   Neutro Abs 8.5 (H) 1.7 - 7.7 K/uL   Lymphocytes Relative 8 %   Lymphs Abs 0.8 0.7 - 4.0 K/uL   Monocytes Relative 9 %   Monocytes Absolute 0.9 0.1 - 1.0 K/uL    Eosinophils Relative 0 %   Eosinophils Absolute 0.0 0.0 - 0.7 K/uL   Basophils Relative 0 %   Basophils Absolute 0.0 0.0 - 0.1 K/uL    Comment: Performed at [facility], [address] [place], [place] [address]  Basic metabolic panel     Status: Abnormal   Collection Time: 03/15/18  4:26 AM  Result Value Ref Range   Sodium 129 (L) 135 - 145 mmol/L   Potassium 3.7 3.5 - 5.1 mmol/L   Chloride 95 (L) 98 - 111 mmol/L   CO2 27 22 - 32 mmol/L   Glucose, Bld 129 (H) 70 - 99 mg/dL   BUN 12 8 - 23 mg/dL   Creatinine, Ser 0.66 0.44 - 1.00 mg/dL   Calcium 8.2 (L) 8.9 - 10.3 mg/dL   GFR calc non Af Amer >60 >60 mL/min   GFR calc Af Amer >60 >60 mL/min    Comment: (NOTE) The eGFR has been calculated using the CKD EPI equation. This calculation has not been validated in all clinical situations. eGFR's persistently <60 mL/min signify possible Chronic Kidney Disease.    Anion gap 7 5 -   15    Comment: Performed at [facility], [address] [place], [place] [address]  Protime-INR     Status: None   Collection Time: 03/15/18  4:26 AM  Result Value Ref Range   Prothrombin Time 13.8 11.4 - 15.2 seconds   INR 1.07     Comment: Performed at [facility], [address] [place], [place] [address]    Chest Portable 1 View  Result Date: 03/15/2018 CLINICAL DATA:  Preoperative evaluation, fall this morning, hypertension, COPD EXAM: PORTABLE CHEST 1 VIEW COMPARISON:  Portable exam 0650 hours compared to 02/06/2016 FINDINGS: Enlargement of cardiac silhouette with pulmonary vascular congestion. Atherosclerotic calcification aorta. Bronchitic changes with LEFT basilar opacity question atelectasis versus infiltrate. Minimal RIGHT basilar atelectasis. Upper lungs clear. No gross pleural effusion or pneumothorax. Bones demineralized. IMPRESSION: Enlargement of cardiac silhouette with mild RIGHT basilar atelectasis and either atelectasis or consolidation at LEFT base. Electronically Signed   By: Mark  Boles  M.D.   On: 03/15/2018 07:09   Dg Knee Complete 4 Views Left  Result Date: 03/15/2018 CLINICAL DATA:  Left knee pain after fall EXAM: LEFT KNEE - COMPLETE 4+ VIEW COMPARISON:  None. FINDINGS: No acute fracture nor joint dislocation. Subchondral areas of osteopenia involving the weight-bearing portion of the medial femoral condyle may reflect chondral fissuring and/or thinning. Degenerative spurring is noted of the tibial spines. No joint effusion. IMPRESSION: Mild femorotibial joint osteoarthritis.  No acute fracture. Electronically Signed   By: David  Kwon M.D.   On: 03/15/2018 03:43   Dg Knee Complete 4 Views Right  Result Date: 03/15/2018 CLINICAL DATA:  Patient fell today and has right knee pain. EXAM: RIGHT KNEE - COMPLETE 4+ VIEW COMPARISON:  None. FINDINGS: Acute oblique displaced closed fracture of the distal right femoral diaphysis extending into the metaphysis. There is of the 16 mm of medial displacement of the femoral condyles and distal fracture fragment relative to the femoral diaphysis. No intra-articular extension is identified. Osteoarthritic joint space narrowing of the femorotibial compartment is seen. The patellofemoral appears intact. No joint effusion. IMPRESSION: Acute, closed, oblique fracture of the distal femoral diaphysis exiting out the lateral femoral metaphysis and with resultant 16 mm of medial displacement of the femoral condyles and distal fracture fragment. Electronically Signed   By: David  Kwon M.D.   On: 03/15/2018 03:41   Dg Hip Unilat W Or Wo Pelvis 2-3 Views Right  Result Date: 03/15/2018 CLINICAL DATA:  Right hip pain after fall today. EXAM: DG HIP (WITH OR WITHOUT PELVIS) 2-3V RIGHT COMPARISON:  None. FINDINGS: AP view the pelvis demonstrates degenerative disc disease of the included lower lumbar spine with spurring off the endplates from L2 through S1. Osteoarthritis of the SI joints and pubic symphysis without diastasis. No acute pelvic fracture. Left greater than  right joint space narrowing of the hips without fracture or joint dislocation. Femoral heads are spherical in appearance without flattening. IMPRESSION: Lower lumbar degenerative disc facet arthropathy. No acute pelvic or hip fracture. Osteoarthritis of the SI joints, pubic symphysis and left greater than right hip joint space narrowing. Electronically Signed   By: David  Kwon M.D.   On: 03/15/2018 03:45   Dg Femur Min 2 Views Right  Result Date: 03/15/2018 CLINICAL DATA:  Pain after fall. EXAM: RIGHT FEMUR 2 VIEWS COMPARISON:  None. FINDINGS: Acute, closed, oblique fracture of the right distal femoral diaphysis exiting the lateral femoral metaphysis. Resultant medial displacement of the distal fracture fragment which includes both femoral condyles at least 1/2   shaft width. Osteoarthritis of the femorotibial compartment with spurring. No intra-articular extension of fracture. No joint effusion. IMPRESSION: Acute oblique distal femoral diaphyseal fracture exiting the lateral femoral metaphysis with resultant medial displacement of the distal fracture fragment by 1/2 shaft width. Osteoarthritic joint space narrowing of the femorotibial compartment. Electronically Signed   By: David  Kwon M.D.   On: 03/15/2018 03:47    Review of Systems  Constitutional: Negative for weight loss.  HENT: Negative for ear discharge, ear pain, hearing loss and tinnitus.   Eyes: Negative for blurred vision, double vision, photophobia and pain.  Respiratory: Negative for cough, sputum production and shortness of breath.   Cardiovascular: Negative for chest pain.  Gastrointestinal: Negative for abdominal pain, nausea and vomiting.  Genitourinary: Negative for dysuria, flank pain, frequency and urgency.  Musculoskeletal: Positive for joint pain (Right knee). Negative for back pain, falls, myalgias and neck pain.  Neurological: Negative for dizziness, tingling, sensory change, focal weakness, loss of consciousness and headaches.   Endo/Heme/Allergies: Does not bruise/bleed easily.  Psychiatric/Behavioral: Negative for depression, memory loss and substance abuse. The patient is not nervous/anxious.    Blood pressure (!) 173/73, pulse 87, temperature 98.2 F (36.8 C), temperature source Oral, resp. rate (!) 21, height 5' 2" (1.575 m), weight 90.7 kg, SpO2 96 %. Physical Exam  Constitutional: She appears well-developed and well-nourished. No distress.  HENT:  Head: Normocephalic and atraumatic.  Eyes: Conjunctivae are normal. Right eye exhibits no discharge. Left eye exhibits no discharge. No scleral icterus.  Neck: Normal range of motion.  Cardiovascular: Normal rate and regular rhythm.  Respiratory: Effort normal. No respiratory distress.  Musculoskeletal:  RLE No traumatic wounds, ecchymosis, or rash  In KI  No ankle effusion  Sens DPN, SPN, TN intact  Motor EHL, ext, flex, evers 5/5  DP 2+, PT 1+, No significant edema  Neurological: She is alert.  Skin: Skin is warm and dry. She is not diaphoretic.  Psychiatric: She has a normal mood and affect. Her behavior is normal.    Assessment/Plan: Right femur fx -- Plan ORIF tomorrow with Dr. Haddix. NPO after MN. Multiple medical problems including COPD, HLD, HTN, osteopenia, and chronic rhinitis    Dyllen Menning J. Hiroto Saltzman, PA-C Orthopedic Surgery [phone] 03/15/2018, 9:34 AM  

## 2018-03-15 NOTE — ED Triage Notes (Signed)
Pt fell from standing while trying to get back in bed. C/o R knee pain and swelling. Pt has dementia per EMS. Son enroute.

## 2018-03-15 NOTE — Progress Notes (Signed)
Nutrition Consult/Brief Note  RD consulted via hip fracture protocol.  Per readings below, pt's weight has been stable.  Wt Readings from Last 15 Encounters:  03/15/18 90.7 kg  02/07/18 86 kg  11/08/17 86.2 kg  09/10/17 85.9 kg  04/30/17 83.9 kg  04/29/17 82.6 kg  02/16/17 83.5 kg  10/27/16 81 kg  04/23/16 85.7 kg  04/13/16 87.1 kg  04/07/16 87.1 kg  02/17/16 87.4 kg  12/26/15 87.1 kg  12/02/15 89.2 kg  10/22/15 88.7 kg   Body mass index is 36.58 kg/m. Patient meets criteria for Obesity Class II based on current BMI.   Current diet order is Heart Healthy. Labs and medications reviewed. Plan for ORIF tomorrow.  No nutrition interventions warranted at this time. If nutrition issues arise, please consult RD.   Arthur Holms, RD, LDN Pager #: [phone] After-Hours Pager #: [phone]

## 2018-03-15 NOTE — Progress Notes (Signed)
Patient with right distal femur fracture. Will need to undergo ORIF. Tentatively plan for OR tomorrow 9/4. Patient may have diet today from orthopaedic perspective. Formal consult to occur once patient arrives to [facility].  Shona Needles, MD [facility] [phone] (phone)

## 2018-03-15 NOTE — Progress Notes (Addendum)
PROGRESS NOTE    Carrie Crawford  ZOX:[id number] DOB: Dec 04, 1925 DOA: 03/15/2018 PCP: Eustaquio Maize, MD    Brief Narrative:  84 year old female who presented after a mechanical fall.  She does have the significant past medical history for COPD, dyslipidemia, hypertension, obesity and osteopenia.  She sustained a mechanical fall from her own height while trying to get to the bathroom.  She developed significant pain after the trauma unable to stand back on her feet.  On her initial physical examination temperature was 98.2, heart rate 94, respiratory rate 20, blood pressure 181/76, oxygen saturation 94%.  Moist mucous membranes, lungs were clear to auscultation bilaterally, heart S1-S2 present and rhythmic, abdomen was soft, protuberant, nontender, no lower extremity edema, right knee was in a soft cast immobilization with local ecchymosis.  129, potassium 3.7, chloride 95, bicarb 27, glucose 129, BUN 12, creatinine 0.6, white count 10.2, hemoglobin 11.8, 35.1, platelets 226.  Chest x-ray with left rotation, mild cardiomegaly, left base atelectasis.  EKG with left axis deviation, normal intervals.  Right femur x-ray with acute oblique distal femoral diaphyseal fracture existing  the lateral femoral metaphysis with resultant medial displacement of the distal fracture fragment it by half shaft width.  Patient is admitted to the hospital with a working diagnosis of right distal femoral fracture.    Assessment & Plan:   Principal Problem:   Right femoral fracture (HCC) Active Problems:   Obstructive chronic bronchitis without exacerbation (HCC)   Hypertension   Hyperlipidemia with target LDL less than 130   Hyperglycemia   Anemia   Hyponatremia  1. Right distal femoral fracture. Will continue pain control (oxucodone/ morphine) and dvt prophylaxis, follow with orthopedic recommendations. Possible surgical intervention in am.   2. HTN. Continue blood pressure control with losartan.   3.  Hyponatremia due to dehydration. Will continue hydration with isotonic saline at 75 ml per hour, will follow on renal panel in am.   4. COPD. Stable with no signs of exacerbation, will continue inhales fluticasone vilanterol and tiotropium.   DVT prophylaxis:scd   Code Status:  full Family Communication: I spoke with patient's sons at the bedside and all questions were addressed.  Disposition Plan/ discharge barriers: pending surgical intervention    Consultants:   Orthopedics  Procedures:     Antimicrobials:       Subjective: Patient continue to have pain on the right hip, worse with movement, no nausea or vomiting, no chest pain or dyspnea.   Objective: Vitals:   03/15/18 0715 03/15/18 0730 03/15/18 0800 03/15/18 1236  BP:  (!) 161/78 (!) 173/73 (!) 176/74  Pulse: 87 86 87 73  Resp: 18 (!) 26 (!) 21 20  Temp:    97.9 F (36.6 C)  TempSrc:    Oral  SpO2: 98% 97% 96% 94%  Weight:      Height:       No intake or output data in the 24 hours ending 03/15/18 1317 Filed Weights   03/15/18 0129  Weight: 90.7 kg    Examination:   General: Not in pain or dyspnea, deconditioned  Neurology: Awake and alert, non focal  E ENT: mild pallor, no icterus, oral mucosa moist Cardiovascular: No JVD. S1-S2 present, rhythmic, no gallops, rubs, or murmurs. Non pitting lower extremity edema. Pulmonary: vesicular breath sounds bilaterally, adequate air movement, no wheezing, rhonchi or rales. Gastrointestinal. Abdomen protuberant no organomegaly, non tender, no rebound or guarding Skin. No rashes Musculoskeletal: no joint deformities/ tender right hip and thigh  Data Reviewed: I have personally reviewed following labs and imaging studies  CBC: Recent Labs  Lab 03/15/18 0426  WBC 10.2  NEUTROABS 8.5*  HGB 11.8*  HCT 35.1*  MCV 92.4  PLT 413   Basic Metabolic Panel: Recent Labs  Lab 03/15/18 0426  NA 129*  K 3.7  CL 95*  CO2 27  GLUCOSE 129*  BUN 12    CREATININE 0.66  CALCIUM 8.2*   GFR: Estimated Creatinine Clearance: 49.9 mL/min (by C-G formula based on SCr of 0.66 mg/dL). Liver Function Tests: No results for input(s): AST, ALT, ALKPHOS, BILITOT, PROT, ALBUMIN in the last 168 hours. No results for input(s): LIPASE, AMYLASE in the last 168 hours. No results for input(s): AMMONIA in the last 168 hours. Coagulation Profile: Recent Labs  Lab 03/15/18 0426  INR 1.07   Cardiac Enzymes: No results for input(s): CKTOTAL, CKMB, CKMBINDEX, TROPONINI in the last 168 hours. BNP (last 3 results) No results for input(s): PROBNP in the last 8760 hours. HbA1C: No results for input(s): HGBA1C in the last 72 hours. CBG: No results for input(s): GLUCAP in the last 168 hours. Lipid Profile: No results for input(s): CHOL, HDL, LDLCALC, TRIG, CHOLHDL, LDLDIRECT in the last 72 hours. Thyroid Function Tests: No results for input(s): TSH, T4TOTAL, FREET4, T3FREE, THYROIDAB in the last 72 hours. Anemia Panel: No results for input(s): VITAMINB12, FOLATE, FERRITIN, TIBC, IRON, RETICCTPCT in the last 72 hours.    Radiology Studies: I have reviewed all of the imaging during this hospital visit personally     Scheduled Meds: . aspirin  81 mg Oral Daily  . azelastine  1 spray Each Nare BID  . Calcium Carbonate-Vitamin D  1 tablet Oral BID  . fluticasone  1 spray Each Nare Daily  . fluticasone furoate-vilanterol  1 puff Inhalation Daily  . losartan  100 mg Oral Daily  . multivitamin with minerals  1 tablet Oral Daily  . tiotropium  18 mcg Inhalation Daily   Continuous Infusions: . sodium chloride 75 mL/hr at 03/15/18 0810     LOS: 0 days        Tawni Millers, MD [facility] Pager [phone]

## 2018-03-16 ENCOUNTER — Encounter (HOSPITAL_COMMUNITY): Payer: Self-pay | Admitting: General Practice

## 2018-03-16 ENCOUNTER — Inpatient Hospital Stay (HOSPITAL_COMMUNITY): Payer: Medicare HMO

## 2018-03-16 ENCOUNTER — Inpatient Hospital Stay (HOSPITAL_COMMUNITY): Payer: Medicare HMO | Admitting: Certified Registered Nurse Anesthetist

## 2018-03-16 ENCOUNTER — Encounter (HOSPITAL_COMMUNITY)
Admission: EM | Disposition: A | Discharge status: Skilled Nursing Facility | Payer: Self-pay | Source: Home / Self Care | Attending: Internal Medicine

## 2018-03-16 DIAGNOSIS — E871 Hypo-osmolality and hyponatremia: Secondary | ICD-10-CM

## 2018-03-16 DIAGNOSIS — W19XXXA Unspecified fall, initial encounter: Secondary | ICD-10-CM

## 2018-03-16 DIAGNOSIS — S72409A Unspecified fracture of lower end of unspecified femur, initial encounter for closed fracture: Secondary | ICD-10-CM

## 2018-03-16 DIAGNOSIS — F0150 Vascular dementia without behavioral disturbance: Secondary | ICD-10-CM

## 2018-03-16 HISTORY — PX: OPEN REDUCTION INTERNAL FIXATION (ORIF) DISTAL RADIAL FRACTURE: SHX5989

## 2018-03-16 HISTORY — PX: ORIF FEMUR FRACTURE: SHX2119

## 2018-03-16 LAB — CBC
HEMATOCRIT: 34 % — AB (ref 36.0–46.0)
Hemoglobin: 11.2 g/dL — ABNORMAL LOW (ref 12.0–15.0)
MCH: 30.7 pg (ref 26.0–34.0)
MCHC: 32.9 g/dL (ref 30.0–36.0)
MCV: 93.2 fL (ref 78.0–100.0)
PLATELETS: 203 10*3/uL (ref 150–400)
RBC: 3.65 MIL/uL — AB (ref 3.87–5.11)
RDW: 13.2 % (ref 11.5–15.5)
WBC: 7.4 10*3/uL (ref 4.0–10.5)

## 2018-03-16 LAB — SURGICAL PCR SCREEN
MRSA, PCR: NEGATIVE
STAPHYLOCOCCUS AUREUS: NEGATIVE

## 2018-03-16 LAB — GLUCOSE, CAPILLARY
GLUCOSE-CAPILLARY: 114 mg/dL — AB (ref 70–99)
GLUCOSE-CAPILLARY: 97 mg/dL (ref 70–99)

## 2018-03-16 LAB — BASIC METABOLIC PANEL
Anion gap: 6 (ref 5–15)
BUN: 5 mg/dL — ABNORMAL LOW (ref 8–23)
CHLORIDE: 100 mmol/L (ref 98–111)
CO2: 23 mmol/L (ref 22–32)
CREATININE: 0.55 mg/dL (ref 0.44–1.00)
Calcium: 7.7 mg/dL — ABNORMAL LOW (ref 8.9–10.3)
GFR calc non Af Amer: >60 mL/min (ref >60)
Glucose, Bld: 115 mg/dL — ABNORMAL HIGH (ref 70–99)
Potassium: 4.1 mmol/L (ref 3.5–5.1)
Sodium: 129 mmol/L — ABNORMAL LOW (ref 135–145)

## 2018-03-16 SURGERY — OPEN REDUCTION INTERNAL FIXATION (ORIF) DISTAL FEMUR FRACTURE
Anesthesia: Regional | Site: Leg Upper | Laterality: Right

## 2018-03-16 MED ORDER — DEXAMETHASONE SODIUM PHOSPHATE 10 MG/ML IJ SOLN
INTRAMUSCULAR | Status: DC | PRN
  Administered 2018-03-16: 10 mg via INTRAVENOUS

## 2018-03-16 MED ORDER — ROCURONIUM BROMIDE 100 MG/10ML IV SOLN
INTRAVENOUS | Status: DC | PRN
  Administered 2018-03-16: 40 mg via INTRAVENOUS

## 2018-03-16 MED ORDER — FENTANYL CITRATE (PF) 100 MCG/2ML IJ SOLN: 25.0000 ug | INTRAMUSCULAR | Status: DC | PRN

## 2018-03-16 MED ORDER — PROPOFOL 10 MG/ML IV BOLUS
INTRAVENOUS | Status: AC
  Filled 2018-03-16: qty 20

## 2018-03-16 MED ORDER — VANCOMYCIN HCL 1000 MG IV SOLR
INTRAVENOUS | Status: AC
  Filled 2018-03-16: qty 1000

## 2018-03-16 MED ORDER — VANCOMYCIN HCL 1000 MG IV SOLR
INTRAVENOUS | Status: DC | PRN
  Administered 2018-03-16: 1000 mg via TOPICAL

## 2018-03-16 MED ORDER — OXYCODONE HCL 5 MG PO TABS: 5.0000 mg | ORAL_TABLET | Freq: Once | ORAL | Status: DC | PRN

## 2018-03-16 MED ORDER — FENTANYL CITRATE (PF) 250 MCG/5ML IJ SOLN
INTRAMUSCULAR | Status: AC
  Filled 2018-03-16: qty 5

## 2018-03-16 MED ORDER — SODIUM CHLORIDE 0.9 % IV SOLN
INTRAVENOUS | Status: DC | PRN
  Administered 2018-03-16: 30 ug/min via INTRAVENOUS

## 2018-03-16 MED ORDER — ONDANSETRON HCL 4 MG/2ML IJ SOLN
INTRAMUSCULAR | Status: DC | PRN
  Administered 2018-03-16: 4 mg via INTRAVENOUS

## 2018-03-16 MED ORDER — LIDOCAINE HCL (CARDIAC) PF 100 MG/5ML IV SOSY
PREFILLED_SYRINGE | INTRAVENOUS | Status: DC | PRN
  Administered 2018-03-16: 60 mg via INTRAVENOUS

## 2018-03-16 MED ORDER — BACITRACIN 500 UNIT/GM EX OINT
TOPICAL_OINTMENT | CUTANEOUS | Status: DC | PRN
  Administered 2018-03-16: 1 via TOPICAL

## 2018-03-16 MED ORDER — PROPOFOL 10 MG/ML IV BOLUS
INTRAVENOUS | Status: DC | PRN
  Administered 2018-03-16: 125 mg via INTRAVENOUS

## 2018-03-16 MED ORDER — OXYCODONE HCL 5 MG/5ML PO SOLN: 5.0000 mg | Freq: Once | ORAL | Status: DC | PRN

## 2018-03-16 MED ORDER — FENTANYL CITRATE (PF) 100 MCG/2ML IJ SOLN
INTRAMUSCULAR | Status: DC | PRN
  Administered 2018-03-16: 50 ug via INTRAVENOUS
  Administered 2018-03-16 (×2): 25 ug via INTRAVENOUS
  Administered 2018-03-16: 50 ug via INTRAVENOUS
  Administered 2018-03-16 (×2): 25 ug via INTRAVENOUS
  Administered 2018-03-16: 50 ug via INTRAVENOUS

## 2018-03-16 MED ORDER — BACITRACIN ZINC 500 UNIT/GM EX OINT
TOPICAL_OINTMENT | CUTANEOUS | Status: AC
  Filled 2018-03-16: qty 28.35

## 2018-03-16 MED ORDER — CEFAZOLIN SODIUM-DEXTROSE 2-3 GM-%(50ML) IV SOLR
INTRAVENOUS | Status: DC | PRN
  Administered 2018-03-16: 2 g via INTRAVENOUS

## 2018-03-16 MED ORDER — INFLUENZA VAC SPLIT HIGH-DOSE 0.5 ML IM SUSY
0.5000 mL | PREFILLED_SYRINGE | INTRAMUSCULAR | Status: AC
  Administered 2018-03-17: 0.5 mL via INTRAMUSCULAR
  Filled 2018-03-16: qty 0.5

## 2018-03-16 MED ORDER — CEFAZOLIN SODIUM-DEXTROSE 2-4 GM/100ML-% IV SOLN
2.0000 g | Freq: Three times a day (TID) | INTRAVENOUS | Status: AC
  Administered 2018-03-16 – 2018-03-17 (×3): 2 g via INTRAVENOUS
  Filled 2018-03-16 (×3): qty 100

## 2018-03-16 MED ORDER — 0.9 % SODIUM CHLORIDE (POUR BTL) OPTIME
TOPICAL | Status: DC | PRN
  Administered 2018-03-16: 1000 mL

## 2018-03-16 MED ORDER — SUGAMMADEX SODIUM 200 MG/2ML IV SOLN
INTRAVENOUS | Status: DC | PRN
  Administered 2018-03-16: 200 mg via INTRAVENOUS

## 2018-03-16 MED ORDER — LACTATED RINGERS IV SOLN
INTRAVENOUS | Status: DC | PRN
  Administered 2018-03-16: 08:00:00 via INTRAVENOUS

## 2018-03-16 MED ORDER — PHENYLEPHRINE HCL 10 MG/ML IJ SOLN
INTRAMUSCULAR | Status: DC | PRN
  Administered 2018-03-16: 80 ug via INTRAVENOUS

## 2018-03-16 MED ORDER — BUPIVACAINE-EPINEPHRINE (PF) 0.5% -1:200000 IJ SOLN
INTRAMUSCULAR | Status: DC | PRN
  Administered 2018-03-16: 30 mL via PERINEURAL

## 2018-03-16 MED ORDER — ONDANSETRON HCL 4 MG/2ML IJ SOLN: 4.0000 mg | Freq: Four times a day (QID) | INTRAMUSCULAR | Status: DC | PRN

## 2018-03-16 SURGICAL SUPPLY — 73 items
BIT DRILL 2.5 NCB (BIT) ×1 IMPLANT
BIT DRILL 2.5MM NCB (BIT) ×1
BIT DRILL 4.3 (BIT) ×2
BIT DRILL 4.3MM (BIT) ×1
BIT DRILL 4.3X300MM (BIT) IMPLANT
BIT DRILL LONG 3.3 (BIT) ×1 IMPLANT
BIT DRILL LONG 3.3MM (BIT) ×1
BIT DRILL QC 3.3X195 (BIT) ×2 IMPLANT
BNDG CMPR MED 10X6 ELC LF (GAUZE/BANDAGES/DRESSINGS) ×1
BNDG COHESIVE 6X5 TAN STRL LF (GAUZE/BANDAGES/DRESSINGS) ×3 IMPLANT
BNDG ELASTIC 6X10 VLCR STRL LF (GAUZE/BANDAGES/DRESSINGS) ×2 IMPLANT
BRUSH SCRUB SURG 4.25 DISP (MISCELLANEOUS) ×6 IMPLANT
CANISTER SUCT 3000ML PPV (MISCELLANEOUS) ×3 IMPLANT
CAP LOCK NCB (Cap) ×18 IMPLANT
CHLORAPREP W/TINT 26ML (MISCELLANEOUS) ×5 IMPLANT
COVER SURGICAL LIGHT HANDLE (MISCELLANEOUS) ×3 IMPLANT
DRAPE C-ARM 42X72 X-RAY (DRAPES) ×3 IMPLANT
DRAPE C-ARMOR (DRAPES) ×3 IMPLANT
DRAPE ORTHO SPLIT 77X108 STRL (DRAPES) ×9
DRAPE SURG 17X23 STRL (DRAPES) ×3 IMPLANT
DRAPE SURG ORHT 6 SPLT 77X108 (DRAPES) ×2 IMPLANT
DRAPE U-SHAPE 47X51 STRL (DRAPES) ×3 IMPLANT
DRSG ADAPTIC 3X8 NADH LF (GAUZE/BANDAGES/DRESSINGS) ×2 IMPLANT
DRSG MEPILEX BORDER 4X8 (GAUZE/BANDAGES/DRESSINGS) ×4 IMPLANT
ELECT REM PT RETURN 9FT ADLT (ELECTROSURGICAL) ×3
ELECTRODE REM PT RTRN 9FT ADLT (ELECTROSURGICAL) ×1 IMPLANT
GAUZE SPONGE 4X4 12PLY STRL (GAUZE/BANDAGES/DRESSINGS) ×2 IMPLANT
GLOVE BIO SURGEON STRL SZ7.5 (GLOVE) ×12 IMPLANT
GLOVE BIOGEL PI IND STRL 7.5 (GLOVE) ×1 IMPLANT
GLOVE BIOGEL PI INDICATOR 7.5 (GLOVE) ×4
GOWN STRL REUS W/ TWL LRG LVL3 (GOWN DISPOSABLE) ×3 IMPLANT
GOWN STRL REUS W/TWL LRG LVL3 (GOWN DISPOSABLE) ×9
K-WIRE 1.8 (WIRE) ×6
K-WIRE 2.0 (WIRE) ×9
K-WIRE FX200X1.8XTROC TIP (WIRE) ×2
K-WIRE FXSTD 280X2XNS SS (WIRE) ×3
KIT BASIN OR (CUSTOM PROCEDURE TRAY) ×3 IMPLANT
KIT TURNOVER KIT B (KITS) ×3 IMPLANT
KWIRE FX200X1.8XTROC TIP (WIRE) IMPLANT
KWIRE FXSTD 280X2XNS SS (WIRE) IMPLANT
NS IRRIG 1000ML POUR BTL (IV SOLUTION) ×3 IMPLANT
PACK TOTAL JOINT (CUSTOM PROCEDURE TRAY) ×3 IMPLANT
PAD ABD 8X10 STRL (GAUZE/BANDAGES/DRESSINGS) ×2 IMPLANT
PAD ARMBOARD 7.5X6 YLW CONV (MISCELLANEOUS) ×6 IMPLANT
PAD CAST 4YDX4 CTTN HI CHSV (CAST SUPPLIES) ×1 IMPLANT
PADDING CAST COTTON 4X4 STRL (CAST SUPPLIES) ×3
PADDING CAST COTTON 6X4 STRL (CAST SUPPLIES) ×3 IMPLANT
PLATE FEM DIST NCB PP 278MM (Plate) ×2 IMPLANT
SCREW 5.0 70MM (Screw) ×2 IMPLANT
SCREW 5.0 80MM (Screw) ×4 IMPLANT
SCREW CANN 5.0X48MM TI (Screw) ×2 IMPLANT
SCREW CANNCELLOUS LEG NCB 5X50 (Screw) ×2 IMPLANT
SCREW CORTICAL NCB 5.0X65 (Screw) ×2 IMPLANT
SCREW NCB 3.5X75X5X6.2XST (Screw) IMPLANT
SCREW NCB 4.0MX34M (Screw) ×2 IMPLANT
SCREW NCB 4.0MX44M (Screw) ×2 IMPLANT
SCREW NCB 4.0MX46M (Screw) ×2 IMPLANT
SCREW NCB 5.0X34MM (Screw) ×4 IMPLANT
SCREW NCB 5.0X36MM (Screw) ×2 IMPLANT
SCREW NCB 5.0X75MM (Screw) ×6 IMPLANT
SCREW PT 5.0X46MM (Screw) ×2 IMPLANT
STAPLER VISISTAT 35W (STAPLE) ×3 IMPLANT
SUCTION FRAZIER HANDLE 10FR (MISCELLANEOUS) ×2
SUCTION TUBE FRAZIER 10FR DISP (MISCELLANEOUS) ×1 IMPLANT
SUT ETHILON 3 0 PS 1 (SUTURE) ×4 IMPLANT
SUT VIC AB 0 CT1 27 (SUTURE) ×3
SUT VIC AB 0 CT1 27XBRD ANBCTR (SUTURE) IMPLANT
SUT VIC AB 1 CT1 27 (SUTURE) ×3
SUT VIC AB 1 CT1 27XBRD ANBCTR (SUTURE) IMPLANT
SUT VIC AB 2-0 CT1 27 (SUTURE) ×6
SUT VIC AB 2-0 CT1 TAPERPNT 27 (SUTURE) ×2 IMPLANT
TOWEL GREEN STERILE (TOWEL DISPOSABLE) ×2 IMPLANT
WATER STERILE IRR 1000ML POUR (IV SOLUTION) ×6 IMPLANT

## 2018-03-16 NOTE — Anesthesia Procedure Notes (Signed)
Procedure Name: Intubation Date/Time: 03/16/2018 8:39 AM Performed by: Shirlyn Goltz, CRNA Pre-anesthesia Checklist: Patient identified, Emergency Drugs available, Suction available and Patient being monitored Patient Re-evaluated:Patient Re-evaluated prior to induction Oxygen Delivery Method: Circle system utilized Preoxygenation: Pre-oxygenation with 100% oxygen Induction Type: IV induction Ventilation: Mask ventilation without difficulty and Oral airway inserted - appropriate to patient size Laryngoscope Size: Mac and 3 Grade View: Grade III Tube type: Oral Tube size: 7.0 mm Number of attempts: 1 Airway Equipment and Method: Stylet Placement Confirmation: ETT inserted through vocal cords under direct vision,  positive ETCO2 and breath sounds checked- equal and bilateral Secured at: 21 cm Tube secured with: Tape Dental Injury: Teeth and Oropharynx as per pre-operative assessment

## 2018-03-16 NOTE — Anesthesia Procedure Notes (Signed)
Anesthesia Regional Block: Femoral nerve block   Pre-Anesthetic Checklist: ,, timeout performed, Correct Patient, Correct Site, Correct Laterality, Correct Procedure,, site marked, risks and benefits discussed, Surgical consent,  Pre-op evaluation,  At surgeon's request and post-op pain management  Laterality: Right  Prep: chloraprep       Needles:  Injection technique: Single-shot  Needle Type: Echogenic Stimulator Needle     Needle Length: 9cm  Needle Gauge: 21     Additional Needles:   Procedures:, nerve stimulator,,,, intact distal pulses,,,   Nerve Stimulator or Paresthesia:  Response: Quadriceps muscle contraction, 0.45 mA,   Additional Responses:   Narrative:  Start time: 03/16/2018 7:53 AM End time: 03/16/2018 8:02 AM Injection made incrementally with aspirations every 5 mL.  Performed by: Personally  Anesthesiologist: Albertha Ghee, MD  Additional Notes: Functioning IV was confirmed and monitors were applied.  A 6mm 21ga Arrow echogenic stimulator needle was used. Sterile prep and drape,hand hygiene and sterile gloves were used.  Negative aspiration and negative test dose prior to incremental administration of local anesthetic. The patient tolerated the procedure well.

## 2018-03-16 NOTE — Anesthesia Preprocedure Evaluation (Signed)
Anesthesia Evaluation  Patient identified by MRN, date of birth, ID band Patient awake    Reviewed: Allergy & Precautions, H&P , NPO status , Patient's Chart, lab work & pertinent test results  Airway Mallampati: II   Neck ROM: full    Dental   Pulmonary COPD,    breath sounds clear to auscultation       Cardiovascular hypertension,  Rhythm:regular Rate:Normal     Neuro/Psych  Neuromuscular disease    GI/Hepatic   Endo/Other  obese  Renal/GU      Musculoskeletal Distal femur fx   Abdominal   Peds  Hematology   Anesthesia Other Findings   Reproductive/Obstetrics                             Anesthesia Physical Anesthesia Plan  ASA: III  Anesthesia Plan: General and Regional   Post-op Pain Management:  Regional for Post-op pain   Induction: Intravenous  PONV Risk Score and Plan: 3 and Ondansetron, Dexamethasone, Scopolamine patch - Pre-op and Treatment may vary due to age or medical condition  Airway Management Planned: Oral ETT  Additional Equipment:   Intra-op Plan:   Post-operative Plan: Extubation in OR  Informed Consent: I have reviewed the patients History and Physical, chart, labs and discussed the procedure including the risks, benefits and alternatives for the proposed anesthesia with the patient or authorized representative who has indicated his/her understanding and acceptance.     Plan Discussed with: CRNA, Anesthesiologist and Surgeon  Anesthesia Plan Comments:         Anesthesia Quick Evaluation

## 2018-03-16 NOTE — Transfer of Care (Signed)
Immediate Anesthesia Transfer of Care Note  Patient: PURITY IRMEN  Procedure(s) Performed: OPEN REDUCTION INTERNAL FIXATION (ORIF) DISTAL FEMUR FRACTURE (Right Leg Upper)  Patient Location: PACU  Anesthesia Type:General and Regional  Level of Consciousness: drowsy and patient cooperative  Airway & Oxygen Therapy: Patient Spontanous Breathing and Patient connected to nasal cannula oxygen  Post-op Assessment: Report given to RN and Post -op Vital signs reviewed and stable  Post vital signs: Reviewed and stable  Last Vitals:  Vitals Value Taken Time  BP 130/52 03/16/2018 10:36 AM  Temp    Pulse 79 03/16/2018 10:37 AM  Resp 21 03/16/2018 10:37 AM  SpO2 91 % 03/16/2018 10:37 AM  Vitals shown include unvalidated device data.  Last Pain:  Vitals:   03/16/18 0523  TempSrc:   PainSc: Asleep      Patients Stated Pain Goal: 0 (04/59/13 6859)  Complications: No apparent anesthesia complications

## 2018-03-16 NOTE — Progress Notes (Signed)
PROGRESS NOTE  Carrie Crawford:[id number] DOB: 01/16/1926 DOA: 03/15/2018 PCP: Eustaquio Maize, MD  HPI/Recap of past 24 hours:  She does not remember she has surgery today, she denies pain, no fever Daughter at bedside  Assessment/Plan: Principal Problem:   Right femoral fracture ([place]) Active Problems:   Obstructive chronic bronchitis without exacerbation ([place])   Hypertension   Hyperlipidemia with target LDL less than 130   Hyperglycemia   Anemia   Hyponatremia  Right supracondylar distal femur fracture  -s/p Open reduction internal fixation of right distal femur fracture on 9/4 by ortho Dr Doreatha Martin - per DR Haddix "Patient will be weightbearing as tolerated to right lower extremity.  She will receive Ancef for postoperative surgical prophylaxis.  Will recommend Lovenox for DVT prophylaxis.  She will mobilize with physical and occupational therapy"  Hyponatremia Appear chronic for years, baseline around 127-130 sHe has been on lasix at home, currently appear dehydrated, lasix held, received  gentle hydration Repeat lab in am   HTN Home meds cozaar/lasix  chronic bronchitis, rhinitis, never smoker Continue home meds on breo/spiriva/nasal spry at home  Dementia? With impaired memory, baseline not oriented to time, but to place and person  Code Status: full  Family Communication: patient and daughter  Disposition Plan: likely will need snf   Consultants:  orthopedics  Procedures:  s/p Open reduction internal fixation of right distal femur fracture on 9/4 by ortho Dr Doreatha Martin  Antibiotics:  perioperative   Objective: BP 135/61 (BP Location: Left Arm)   Pulse 94   Temp (!) 97.4 F (36.3 C) (Oral)   Resp 12   Ht 5\' 2"  (1.575 m)   Wt 90.7 kg   SpO2 97%   BMI 36.58 kg/m   Intake/Output Summary (Last 24 hours) at 03/16/2018 1748 Last data filed at 03/16/2018 1118 Gross per 24 hour  Intake 1595 ml  Output 1625 ml  Net -30 ml   Filed Weights   03/15/18 0129  Weight: 90.7 kg    Exam: Patient is examined daily including today on 03/16/2018, exams remain the same as of yesterday except that has changed    General:  NAD, calm and pleasant, baseline dementia  Cardiovascular: RRR  Respiratory: CTABL  Abdomen: Soft/ND/NT, positive BS  Musculoskeletal: No Edema, right leg post op changes  Neuro: alert, oriented to place and person, impaired memory  Data Reviewed: Basic Metabolic Panel: Recent Labs  Lab 03/15/18 0426 03/16/18 0430  NA 129* 129*  K 3.7 4.1  CL 95* 100  CO2 27 23  GLUCOSE 129* 115*  BUN 12 5*  CREATININE 0.66 0.55  CALCIUM 8.2* 7.7*   Liver Function Tests: No results for input(s): AST, ALT, ALKPHOS, BILITOT, PROT, ALBUMIN in the last 168 hours. No results for input(s): LIPASE, AMYLASE in the last 168 hours. No results for input(s): AMMONIA in the last 168 hours. CBC: Recent Labs  Lab 03/15/18 0426 03/16/18 0430  WBC 10.2 7.4  NEUTROABS 8.5*  --   HGB 11.8* 11.2*  HCT 35.1* 34.0*  MCV 92.4 93.2  PLT 226 203   Cardiac Enzymes:   No results for input(s): CKTOTAL, CKMB, CKMBINDEX, TROPONINI in the last 168 hours. BNP (last 3 results) No results for input(s): BNP in the last 8760 hours.  ProBNP (last 3 results) No results for input(s): PROBNP in the last 8760 hours.  CBG: Recent Labs  Lab 03/16/18 0040 03/16/18 0606  GLUCAP 97 114*    Recent Results (from the past 240  hour(s))  Surgical PCR screen     Status: None   Collection Time: 03/15/18  8:21 PM  Result Value Ref Range Status   MRSA, PCR NEGATIVE NEGATIVE Final   Staphylococcus aureus NEGATIVE NEGATIVE Final    Comment: (NOTE) The Xpert SA Assay (FDA approved for NASAL specimens in patients 60 years of age and older), is one component of a comprehensive surveillance program. It is not intended to diagnose infection nor to guide or monitor treatment. Performed at [facility], [place] [address], [place],  [place] [address]      Studies: Dg C-arm 1-60 Min  Result Date: 03/16/2018 CLINICAL DATA:  ORIF distal RIGHT femoral fracture EXAM: RIGHT FEMUR 2 VIEWS; RIGHT FEMUR PORTABLE 2 VIEW; DG C-ARM 61-120 MIN COMPARISON:  Preoperative evaluation 03/15/2018 FLUOROSCOPY TIME:  2 minutes 12 seconds Images obtained: 8 intraoperatively, 3 postoperatively FINDINGS: RIGHT femur 8 views: Digital C-arm fluoroscopic images obtained intraoperatively demonstrate sequential reduction of previously identified distal RIGHT femoral oblique metadiaphyseal fracture and placement of 2 cannulated screws. Additionally, a lateral plate and multiple screws was placed extending from the proximal to mid RIGHT femoral diaphysis to the lateral femoral condyle. Knee joint alignment normal. RIGHT knee two views: Images obtained postoperatively portably in recovery demonstrate lateral plate and multiple screws across a reduced oblique fracture of the distal RIGHT femoral diaphysis. An additional nondisplaced fracture plane seen extending into the mid femoral diaphysis. Knee joint alignment normal no degenerative changes and knee joint are present. Hip joint space is preserved. There is no evidence of fracture or other focal bone lesions. Soft tissues are unremarkable. IMPRESSION: Intraoperative and postoperative images of the RIGHT femur demonstrating ORIF for previously identified distal RIGHT femur fracture. Note is made of an additional oblique fracture plane extending into the mid to distal RIGHT femoral diaphysis, better visualized than on preoperative exam. Electronically Signed   By: Lavonia Dana M.D.   On: 03/16/2018 13:42   Dg Femur, Min 2 Views Right  Result Date: 03/16/2018 CLINICAL DATA:  ORIF distal RIGHT femoral fracture EXAM: RIGHT FEMUR 2 VIEWS; RIGHT FEMUR PORTABLE 2 VIEW; DG C-ARM 61-120 MIN COMPARISON:  Preoperative evaluation 03/15/2018 FLUOROSCOPY TIME:  2 minutes 12 seconds Images obtained: 8 intraoperatively, 3 postoperatively  FINDINGS: RIGHT femur 8 views: Digital C-arm fluoroscopic images obtained intraoperatively demonstrate sequential reduction of previously identified distal RIGHT femoral oblique metadiaphyseal fracture and placement of 2 cannulated screws. Additionally, a lateral plate and multiple screws was placed extending from the proximal to mid RIGHT femoral diaphysis to the lateral femoral condyle. Knee joint alignment normal. RIGHT knee two views: Images obtained postoperatively portably in recovery demonstrate lateral plate and multiple screws across a reduced oblique fracture of the distal RIGHT femoral diaphysis. An additional nondisplaced fracture plane seen extending into the mid femoral diaphysis. Knee joint alignment normal no degenerative changes and knee joint are present. Hip joint space is preserved. There is no evidence of fracture or other focal bone lesions. Soft tissues are unremarkable. IMPRESSION: Intraoperative and postoperative images of the RIGHT femur demonstrating ORIF for previously identified distal RIGHT femur fracture. Note is made of an additional oblique fracture plane extending into the mid to distal RIGHT femoral diaphysis, better visualized than on preoperative exam. Electronically Signed   By: Lavonia Dana M.D.   On: 03/16/2018 13:42   Dg Femur Port, Min 2 Views Right  Result Date: 03/16/2018 CLINICAL DATA:  ORIF distal RIGHT femoral fracture EXAM: RIGHT FEMUR 2 VIEWS; RIGHT FEMUR PORTABLE 2 VIEW;  DG C-ARM 61-120 MIN COMPARISON:  Preoperative evaluation 03/15/2018 FLUOROSCOPY TIME:  2 minutes 12 seconds Images obtained: 8 intraoperatively, 3 postoperatively FINDINGS: RIGHT femur 8 views: Digital C-arm fluoroscopic images obtained intraoperatively demonstrate sequential reduction of previously identified distal RIGHT femoral oblique metadiaphyseal fracture and placement of 2 cannulated screws. Additionally, a lateral plate and multiple screws was placed extending from the proximal to mid  RIGHT femoral diaphysis to the lateral femoral condyle. Knee joint alignment normal. RIGHT knee two views: Images obtained postoperatively portably in recovery demonstrate lateral plate and multiple screws across a reduced oblique fracture of the distal RIGHT femoral diaphysis. An additional nondisplaced fracture plane seen extending into the mid femoral diaphysis. Knee joint alignment normal no degenerative changes and knee joint are present. Hip joint space is preserved. There is no evidence of fracture or other focal bone lesions. Soft tissues are unremarkable. IMPRESSION: Intraoperative and postoperative images of the RIGHT femur demonstrating ORIF for previously identified distal RIGHT femur fracture. Note is made of an additional oblique fracture plane extending into the mid to distal RIGHT femoral diaphysis, better visualized than on preoperative exam. Electronically Signed   By: Lavonia Dana M.D.   On: 03/16/2018 13:42    Scheduled Meds: . aspirin EC  81 mg Oral Daily  . azelastine  1 spray Each Nare BID  . calcium-vitamin D  1 tablet Oral BID  . fluticasone  1 spray Each Nare Daily  . fluticasone furoate-vilanterol  1 puff Inhalation Daily  . losartan  100 mg Oral Daily  . multivitamin with minerals  1 tablet Oral Daily  . mupirocin ointment  1 application Nasal BID  . tiotropium  18 mcg Inhalation Daily    Continuous Infusions: .  ceFAZolin (ANCEF) IV 2 g (03/16/18 1645)     Time spent: 35mins I have personally reviewed and interpreted on  03/16/2018 daily labs, imagings as discussed above under date review session and assessment and plans.  I reviewed all nursing notes, pharmacy notes, consultant notes,  vitals, pertinent old records  I have discussed plan of care as described above with RN , patient and family on 03/16/2018   Florencia Reasons MD, PhD  [facility] Pager [phone]. If 7PM-7AM, please contact night-coverage at [email or web address], password [facility] 03/16/2018, 5:48 PM  LOS: 1  day

## 2018-03-16 NOTE — Interval H&P Note (Signed)
History and Physical Interval Note:  03/16/2018 8:26 AM  Carrie Crawford  has presented today for surgery, with the diagnosis of Right distal femur fracture  The various methods of treatment have been discussed with the patient and family. After consideration of risks, benefits and other options for treatment, the patient has consented to  Procedure(s): OPEN REDUCTION INTERNAL FIXATION (ORIF) DISTAL FEMUR FRACTURE (Right) as a surgical intervention .  The patient's history has been reviewed, patient examined, no change in status, stable for surgery.  I have reviewed the patient's chart and labs.  Questions were answered to the patient's satisfaction.     Lennette Bihari P Jaquilla Woodroof

## 2018-03-16 NOTE — Op Note (Signed)
OrthopaedicSurgeryOperativeNote (GEX:[id number]) Date of Surgery: 03/16/2018  Admit Date: 03/15/2018   Diagnoses: Pre-Op Diagnoses: Right supracondylar distal femur fracture   Post-Op Diagnosis: Same  Procedures: CPT 27511-Open reduction internal fixation of right distal femur fracture  Surgeons: Primary: Shona Needles, MD   Location:[facility] OR ROOM 07   AnesthesiaGeneral   Antibiotics:Ancef 2g preop   Tourniquettime:* No tourniquets in log * .  [id number]:59 mL   Complications:None  Specimens:None  Implants: Implant Name Type Inv. Item Serial No. Manufacturer Lot No. LRB No. Used Action  SCREW CANN 5.0X48MM TI - [id number] Screw SCREW CANN 5.0X48MM TI  [employer](ORTH,TRAU,BIO,SG)  Right 1 Implanted  SCREW NCB 5.0X75MM - [id number] Screw SCREW NCB 5.0X75MM  [employer](ORTH,TRAU,BIO,SG)  Right 2 Implanted  SCREW 5.0 80MM - [id number] Screw SCREW 5.0 80MM  [employer](ORTH,TRAU,BIO,SG)  Right 2 Implanted  SCREW CORTICAL NCB 5.0X65 - [id number] Screw SCREW CORTICAL NCB 5.0X65  [employer](ORTH,TRAU,BIO,SG)  Right 1 Implanted  SCREW NCB 5.0X36MM - [id number] Screw SCREW NCB 5.0X36MM  [employer](ORTH,TRAU,BIO,SG)  Right 1 Implanted  SCREW NCB 5.0X34MM - [id number] Screw SCREW NCB 5.0X34MM  [employer](ORTH,TRAU,BIO,SG)  Right 2 Implanted  SCREW 5.0 70MM - [id number] Screw SCREW 5.0 70MM  [employer](ORTH,TRAU,BIO,SG)  Right 1 Implanted  SCREW NCB 4.0MX34M - [id number] Screw SCREW NCB 4.0MX34M  [employer](ORTH,TRAU,BIO,SG)  Right 1 Implanted  PLATE FEM DIST NCB PP 278MM - [id number] Plate PLATE FEM DIST NCB PP 278MM  [employer](ORTH,TRAU,BIO,SG)  Right 1 Implanted  5.54mm Canulated screw x 46    [employer]  Right 1 Implanted  CAP LOCK NCB - [id number] Cap CAP LOCK NCB  [employer](ORTH,TRAU,BIO,SG)  Right 9 Implanted    IndicationsforSurgery: 84 year old female history of COPD and a dementia with a ground-level fall with a distal  extra-articular femur fracture.  I feel that open reduction internal fixation is most appropriate to restore function and ambulatory capabilities.  I discussed risks and benefits with the patient and her son. Risks discussed included bleeding requiring blood transfusion, bleeding causing a hematoma, infection, malunion, nonunion, damage to surrounding nerves and blood vessels, pain, hardware prominence or irritation, hardware failure, stiffness, post-traumatic arthritis, DVT/PE, compartment syndrome, and even death.  They agreed to proceed with surgery and consent was obtained.  Operative Findings: Open reduction internal fixation of right distal femur fracture.  Lag fixation of oblique fracture using 5.0 mm partially-threaded cannulated screws.  Neutralization plating using a 12 hole [employer] NCB distal femoral locking plate.  Procedure: The patient was identified in the preoperative holding area. Consent was confirmed with the patient and their family and all questions were answered. The operative extremity was marked after confirmation with the patient. The patientwas then brought back to the operating room by our anesthesia colleagues. The patient was placed under general anesthesia and was carefully transferred over to a radiolucent flattop table. They were secured to the bed and all bony prominences were padded.  A bump was placed under the operative hip. The operative extremity was then prepped and draped in usual sterile fashion. A timeout was performed to verify the patient, the procedure and extremity. Preoperative antibiotics were dosed.  Fluoroscopy was used to identify the fracture. A lateral approach was made to the distal femur. It was taken down through the skin and the IT band was incised in line with the incision. The distal portion of the vastus lateralis was reflected off the IT band and the distal articular block of the lateral femoral condyle was exposed.  I used a Cobb elevator  to clean out the fracture as it was a simple oblique fracture.  I used a bone hook to bring the medial cortex together to the lateral cortex.  A ball spike pusher was used to reduce the fracture and it was held in place provisionally with 2.0 mm K wires.  I then proceeded to place two, 5.0 mm cannulated screws.  Good compression of the fracture was obtained.  I then chose a 12 hole distal femoral locking plate.  Was placed on the jig and slid submuscularly along the lateral cortex.  A 2.0 mm K wire was placed to the distal articular segment to line up with the condyles.  A 3.3 mm drill bit was placed in the proximal hole of the plate to provisionally hold the proximal portion of the plate.  Fluoroscopic images were obtained to confirm adequate reduction and placement of the plate.   The distal segment was drilled and 5.70mm cortical screws were placed in the distal fracture segment. A total of 3 screws were placed initially. A 5.0 mm screw was placed percutaneously in the shaft to reduce the plate to bone. Four screws were placed into the shaft. Locking caps were placed in the shaft screws except for the most proximal screw to prevent a stress riser. The targeting guide was removed. Three more screws were placed in the distal segment. Fluoroscopy was used to confirm adequate reduction of the fracture and appropriate position of the plate and screws. Locking caps were placed on all of the distal screws  The incisions were irrigated with normal saline. A gram of vancomycin powder was placed in the incision around the plate. The IT band was closed with 0-vicryl. The skin was closed with 2-vicryl, 3-0 nylon. The incisions were dressed with a Mepilex dressings and the leg was wrapped with a ACE wrap. The patient was then transferred to the regular floor bed and taken to the PACU in stable condition.  Post Op Plan/Instructions: Patient will be weightbearing as tolerated to right lower extremity.  She will  receive Ancef for postoperative surgical prophylaxis.  Will recommend Lovenox for DVT prophylaxis.  She will mobilize with physical and occupational therapy.  I was present and performed the entire surgery.  Katha Hamming, MD [facility]

## 2018-03-16 NOTE — Clinical Social Work Note (Signed)
Clinical Social Work Assessment  Patient Details  Name: Carrie Crawford MRN: [id number] Date of Birth: [birth year]  Date of referral:  03/16/18               Reason for consult:  Facility Placement                Permission sought to share information with:  Family Supports Permission granted to share information::  Yes, Verbal Permission Granted  Name::     Camera operator::  SNFs  Relationship::  Son  Contact Information:  [phone]  Housing/Transportation Living arrangements for the past 2 months:  Single Family Home Source of Information:  Adult Children Patient Interpreter Needed:  None Criminal Activity/Legal Involvement Pertinent to Current Situation/Hospitalization:  No - Comment as needed Significant Relationships:  Adult Children Lives with:  Adult Children Do you feel safe going back to the place where you live?  No Need for family participation in patient care:  Yes (Comment)  Care giving concerns:  CSW received consult for discharge needs. CSW spoke with patient's family and her son, Carrie Crawford regarding possibility  of SNF placement at time of discharge. Patient lives with her son. Family expressed they want her mobility to increase so they can better care for her at home.    Social Worker assessment / plan: CSW spoke with patient's family concerning possibility of rehab at [facility] before returning home.  Employment status:  Retired Forensic scientist:  Medicare PT Recommendations:  Not assessed at this time [place] / Referral to community resources:  [place]  Patient/Family's Response to care:  Patient's family  recognizes need for rehab before returning home and is agreeable to a SNF placement. Patient's family reported preference for [facility] in [place] because it is close to their home and they are familiar with this facility.  Patient/Family's Understanding of and Emotional Response to Diagnosis, Current Treatment, and Prognosis: Patient's family  is realistic regarding therapy needs and expressed being hopeful for SNF placement. Patient's son Carrie Crawford, was tearful and expressed that this experience had drained him and he was mentally tried. CSW discussed self-care, family support and reaching out to resources in the community. Family was receptive and appreciative of discussion.  Family expressed understanding of CSW role and discharge process as well as medical condition. No questions/concerns about plan or treatment.  Emotional Assessment Appearance:  Appears stated age Attitude/Demeanor/Rapport:  Engaged Affect (typically observed):  Calm, Appropriate Orientation:  Oriented to Self Alcohol / Substance use:  Not Applicable Psych involvement (Current and /or in the community):  No (Comment)  Discharge Needs  Concerns to be addressed:  Care Coordination Readmission within the last 30 days:  No Current discharge risk:  None Barriers to Discharge:  Continued Medical Work up   [employer], [place] 03/16/2018, 5:35 PM

## 2018-03-16 NOTE — Progress Notes (Signed)
Called to assess pt for cough. Pt in no obvious respiratory distress at this time. Vitals are stable. Pt with strong cough, productive at times, but lungs are clear. Pt and family state she has coughed like this mainly at night for years. Advised pt to notify RN if coughing became worse and we could possibly get cough medicine ordered. RT will continue to monitor.

## 2018-03-16 NOTE — Progress Notes (Signed)
Orthopedic Tech Progress Note Patient Details:  MARLEIGH KAYLOR 04-May-1926 [id number]  Patient ID: Carrie Crawford, female   DOB: Nov 27, 1925, 84 y.o.   MRN: [id number] Pt unable to use trapeze bar patient helper; RN notified  Hildred Priest 03/16/2018, 5:49 PM

## 2018-03-16 NOTE — NC FL2 (Signed)
[place] LEVEL OF CARE SCREENING TOOL     IDENTIFICATION  Patient Name: Carrie Crawford Birthdate: 1926/04/20 Sex: female Admission Date (Current Location): 03/15/2018  [facility] and [place] Number:  [employer] and Address:  [facility], [place] [address], [place], [place] [address]      Provider Number: [id number]  Attending Physician Name and Address:  Florencia Reasons, MD  Relative Name and Phone Number:  [name] [phone]    Current Level of Care: Hospital Recommended Level of Care: [place] Prior Approval Number:    Date Approved/Denied:   PASRR Number: [id number]  Discharge Plan: SNF    Current Diagnoses: Patient Active Problem List   Diagnosis Date Noted  . Right femoral fracture ([place]) 03/15/2018  . Anemia 03/15/2018  . Hyponatremia 03/15/2018  . Hyperglycemia 04/29/2017  . Obesity (BMI 30-39.9) 02/17/2017  . OAB (overactive bladder) 02/16/2017  . Age-related osteoporosis with current pathological fracture 04/23/2016  . Estrogen deficiency 04/23/2016  . Fracture of posterior thoracic vertebral body with routine healing 04/23/2016  . Diastolic dysfunction with acute on chronic heart failure ([place]) 02/17/2016  . Routine general medical examination at a health care facility 04/23/2015  . Hyperlipidemia with target LDL less than 130   . Hypertension 04/26/2013  . Rhinitis, chronic 01/16/2011  . Obstructive chronic bronchitis without exacerbation ([place]) 01/16/2011    Orientation RESPIRATION BLADDER Height & Weight     Self  O2(nasal cannula 2L/min) Incontinent(uninary catheter ) Weight: 200 lb (90.7 kg) Height:  5\' 2"  (157.5 cm)  BEHAVIORAL SYMPTOMS/MOOD NEUROLOGICAL BOWEL NUTRITION STATUS      Continent Diet(please see discharge summary)  AMBULATORY STATUS COMMUNICATION OF NEEDS Skin     Verbally Other (Comment)(incision(closed) right leg)                       Personal Care Assistance Level of  Assistance  Bathing, Feeding, Dressing Bathing Assistance: Maximum assistance Feeding assistance: Limited assistance Dressing Assistance: Maximum assistance     Functional Limitations Info  Sight, Hearing, Speech Sight Info: Adequate Hearing Info: Adequate Speech Info: Adequate    SPECIAL CARE FACTORS FREQUENCY                       Contractures Contractures Info: Not present    Additional Factors Info  Code Status, Allergies Code Status Info: FULL Allergies Info: NKA           Current Medications (03/16/2018):  This is the current hospital active medication list Current Facility-Administered Medications  Medication Dose Route Frequency Provider Last Rate Last Dose  . aspirin EC tablet 81 mg  81 mg Oral Daily Haddix, Thomasene Lot, MD   81 mg at 03/16/18 1219  . azelastine (ASTELIN) 0.1 % nasal spray 1 spray  1 spray Each Nare BID Haddix, Thomasene Lot, MD   1 spray at 03/16/18 1220  . calcium-vitamin D (OSCAL WITH D) 500-200 MG-UNIT per tablet 1 tablet  1 tablet Oral BID Haddix, Thomasene Lot, MD   1 tablet at 03/16/18 1645  . ceFAZolin (ANCEF) IVPB 2g/100 mL premix  2 g Intravenous Q8H Haddix, Thomasene Lot, MD 200 mL/hr at 03/16/18 1645 2 g at 03/16/18 1645  . fluticasone (FLONASE) 50 MCG/ACT nasal spray 1 spray  1 spray Each Nare Daily Haddix, Thomasene Lot, MD   1 spray at 03/16/18 1221  . fluticasone furoate-vilanterol (BREO ELLIPTA) 100-25 MCG/INH 1 puff  1 puff Inhalation  Daily Haddix, Thomasene Lot, MD      . losartan (COZAAR) tablet 100 mg  100 mg Oral Daily Haddix, Thomasene Lot, MD   50 mg at 03/16/18 1219  . morphine 2 MG/ML injection 2 mg  2 mg Intravenous Q2H PRN Haddix, Thomasene Lot, MD   2 mg at 03/15/18 0810  . multivitamin with minerals tablet 1 tablet  1 tablet Oral Daily Haddix, Thomasene Lot, MD   1 tablet at 03/16/18 1219  . mupirocin ointment (BACTROBAN) 2 % 1 application  1 application Nasal BID Haddix, Thomasene Lot, MD      . ondansetron (ZOFRAN) tablet 4 mg  4 mg Oral Q6H PRN Haddix, Thomasene Lot, MD        Or  . ondansetron (ZOFRAN) injection 4 mg  4 mg Intravenous Q6H PRN Haddix, Thomasene Lot, MD      . oxyCODONE (Oxy IR/ROXICODONE) immediate release tablet 5 mg  5 mg Oral Q6H PRN Haddix, Thomasene Lot, MD   5 mg at 03/16/18 0423  . tiotropium (SPIRIVA) inhalation capsule 18 mcg  18 mcg Inhalation Daily Haddix, Thomasene Lot, MD         Discharge Medications: Please see discharge summary for a list of discharge medications.  Relevant Imaging Results:  Relevant Lab Results:   Additional Information SSN [id number]  Vinie Sill, LCSWA

## 2018-03-17 ENCOUNTER — Inpatient Hospital Stay (HOSPITAL_COMMUNITY): Payer: Medicare HMO

## 2018-03-17 ENCOUNTER — Encounter (HOSPITAL_COMMUNITY): Payer: Self-pay | Admitting: Student

## 2018-03-17 LAB — CBC WITH DIFFERENTIAL/PLATELET
ABS IMMATURE GRANULOCYTES: 0 10*3/uL (ref 0.0–0.1)
Basophils Absolute: 0 10*3/uL (ref 0.0–0.1)
Basophils Relative: 0 %
Eosinophils Absolute: 0 10*3/uL (ref 0.0–0.7)
Eosinophils Relative: 0 %
HEMATOCRIT: 29.7 % — AB (ref 36.0–46.0)
Hemoglobin: 9.8 g/dL — ABNORMAL LOW (ref 12.0–15.0)
Immature Granulocytes: 0 %
LYMPHS ABS: 1.4 10*3/uL (ref 0.7–4.0)
LYMPHS PCT: 15 %
MCH: 30.8 pg (ref 26.0–34.0)
MCHC: 33 g/dL (ref 30.0–36.0)
MCV: 93.4 fL (ref 78.0–100.0)
MONO ABS: 1.1 10*3/uL — AB (ref 0.1–1.0)
MONOS PCT: 11 %
NEUTROS ABS: 7 10*3/uL (ref 1.7–7.7)
Neutrophils Relative %: 74 %
Platelets: 190 10*3/uL (ref 150–400)
RBC: 3.18 MIL/uL — ABNORMAL LOW (ref 3.87–5.11)
RDW: 13.1 % (ref 11.5–15.5)
WBC: 9.5 10*3/uL (ref 4.0–10.5)

## 2018-03-17 LAB — BASIC METABOLIC PANEL
Anion gap: 7 (ref 5–15)
BUN: 11 mg/dL (ref 8–23)
CHLORIDE: 95 mmol/L — AB (ref 98–111)
CO2: 28 mmol/L (ref 22–32)
Calcium: 7.9 mg/dL — ABNORMAL LOW (ref 8.9–10.3)
Creatinine, Ser: 0.73 mg/dL (ref 0.44–1.00)
GFR calc Af Amer: >60 mL/min (ref >60)
GFR calc non Af Amer: >60 mL/min (ref >60)
GLUCOSE: 118 mg/dL — AB (ref 70–99)
Potassium: 4.2 mmol/L (ref 3.5–5.1)
SODIUM: 130 mmol/L — AB (ref 135–145)

## 2018-03-17 LAB — MAGNESIUM: MAGNESIUM: 2.1 mg/dL (ref 1.7–2.4)

## 2018-03-17 MED ORDER — ENOXAPARIN SODIUM 40 MG/0.4ML ~~LOC~~ SOLN
40.0000 mg | SUBCUTANEOUS | Status: DC
  Administered 2018-03-17 – 2018-03-18 (×2): 40 mg via SUBCUTANEOUS
  Filled 2018-03-17 (×2): qty 0.4

## 2018-03-17 MED ORDER — ACETAMINOPHEN 325 MG PO TABS
650.0000 mg | ORAL_TABLET | Freq: Four times a day (QID) | ORAL | Status: DC | PRN
  Administered 2018-03-17 – 2018-03-18 (×2): 650 mg via ORAL
  Filled 2018-03-17 (×2): qty 2

## 2018-03-17 NOTE — Progress Notes (Signed)
Orthopaedic Trauma Progress Note  S: Patient confused this morning.  Daughter at bedside.  She is about to work with therapy.  Having no pain in her leg.  O:  Vitals:   03/16/18 2134 03/17/18 0358  BP: (!) 106/52 (!) 119/54  Pulse: 87 87  Resp: 15 16  Temp: 98.8 F (37.1 C) 98.3 F (36.8 C)  SpO2: 94% 95%   General: No acute distress awake and alert.  Oriented to person and place but not time  Right lower extremity: Dressing is in place clean dry and intact.  Gentle range of motion of the knee without pain.  She is neurovascularly intact distally.  Imaging: Stable post op imaging  Labs:  Results for orders placed or performed during the hospital encounter of 03/15/18 (from the past 24 hour(s))  CBC with Differential/Platelet     Status: Abnormal   Collection Time: 03/17/18  8:35 AM  Result Value Ref Range   WBC 9.5 4.0 - 10.5 K/uL   RBC 3.18 (L) 3.87 - 5.11 MIL/uL   Hemoglobin 9.8 (L) 12.0 - 15.0 g/dL   HCT 29.7 (L) 36.0 - 46.0 %   MCV 93.4 78.0 - 100.0 fL   MCH 30.8 26.0 - 34.0 pg   MCHC 33.0 30.0 - 36.0 g/dL   RDW 13.1 11.5 - 15.5 %   Platelets 190 150 - 400 K/uL   Neutrophils Relative % 74 %   Neutro Abs 7.0 1.7 - 7.7 K/uL   Lymphocytes Relative 15 %   Lymphs Abs 1.4 0.7 - 4.0 K/uL   Monocytes Relative 11 %   Monocytes Absolute 1.1 (H) 0.1 - 1.0 K/uL   Eosinophils Relative 0 %   Eosinophils Absolute 0.0 0.0 - 0.7 K/uL   Basophils Relative 0 %   Basophils Absolute 0.0 0.0 - 0.1 K/uL   Immature Granulocytes 0 %   Abs Immature Granulocytes 0.0 0.0 - 0.1 K/uL    Assessment: 84 year old female s/p fall  Injuries: Right distal femur fracture s/p ORIF  Weightbearing: WBAT RLE  Insicional and dressing care: Dressing change POD2  Orthopedic device(s):None needed  CV/Blood loss:Hgb 9.8, hemodynamically stable this AM  Pain management: 1. Oxycodone 5 mg q 6 hours PRN 2. Morphine 2 mg q 2hrs PRN 3. Will start tylenol for pain to be able to limit narcotic  medications  VTE prophylaxis: Lovenox 40 mg to start today  ID: Ancef 2 gm for 24 hours  Foley/Lines: Foley to be removed post PT, KVO IVF  Medical co-morbidities: 1. COPD-home meds 2. HTN-stable 3. Dementia  Impediments to Fracture Healing: Osteoporosis  Dispo: PT/OT eval likely SNF  Follow - up plan: 2 weeks for suture removal and x-rays  Shona Needles, MD [facility] [phone] (phone)

## 2018-03-17 NOTE — Plan of Care (Signed)
Problem: Education: Goal: Knowledge of General Education information will improve Description Including pain rating scale, medication(s)/side effects and non-pharmacologic comfort measures Outcome: Progressing   Problem: Health Behavior/Discharge Planning: Goal: Ability to manage health-related needs will improve Outcome: Progressing   Problem: Clinical Measurements: Goal: Respiratory complications will improve Outcome: Progressing   Problem: Activity: Goal: Risk for activity intolerance will decrease Outcome: Progressing   Problem: Nutrition: Goal: Adequate nutrition will be maintained Outcome: Progressing   Problem: Pain Managment: Goal: General experience of comfort will improve Outcome: Progressing   Problem: Safety: Goal: Ability to remain free from injury will improve Outcome: Progressing

## 2018-03-17 NOTE — Progress Notes (Signed)
Physical Therapy Evaluation Patient Details Name: Carrie Crawford MRN: [id number] DOB: [birth year] Today's Date: 03/17/2018   History of Present Illness   Pt is a 84 y.o. Female s/p ORIF on R distal distal femur. Pt is WBAT on RLE. PMH significant for COPD, hyperlipidemia, HTN, and OP.   Clinical Impression  Patient is s/p above surgery resulting in deficits listed below (see PT Problem List). At time of evaluation pt required gross max A +2 for bed mobility secondary to pain in RLE and confusion. Unsure of assist pt was receiving PTA as pt lives with son,but was not present at time of session. Pt is appropriate for use of stedy with transfers for staff. Recommending d/c to SNF to improve independence with mobility and maximize safety.  Patient will benefit from skilled PT to increase their independence and safety with mobility to allow discharge to the venue listed below.       Follow Up Recommendations SNF;Supervision/Assistance - 24 hour    Equipment Recommendations       Recommendations for Other Services OT consult     Precautions / Restrictions        Mobility  Bed Mobility Overal bed mobility: Needs Assistance Bed Mobility: Sit to Supine       Sit to supine: Max assist;+2 for physical assistance;HOB elevated   General bed mobility comments: Pt required Total A +2 to sit EOB. VCs needed for technqiue and sequencing; hand over hand to place LUE onto R bedrail to assist trunk elevation; pt actively moving LLE off side of bed but requiring total assist for RLE; Total assist with bed bad utilized to elevate trunk and scoot EOB  Transfers Overall transfer level: Needs assistance   Transfers: Sit to/from Stand Sit to Stand: Mod assist;+2 physical assistance;+2 safety/equipment         General transfer comment: Mod A +2 to assist to stand; VCs required for BIL hip ext in order to place flaps behind hips; Tactile and VCs required for pt to pull to stand with use of stedy bar;  placed pillow in chair to elevate hard surface in order to improve safety with descent to chair   Ambulation/Gait             General Gait Details: unable   Stairs            Wheelchair Mobility    Modified Rankin (Stroke Patients Only)       Balance Overall balance assessment: Needs assistance Sitting-balance support: Single extremity supported;Feet supported Sitting balance-Leahy Scale: Fair Sitting balance - Comments: Single UE support required while placing gait belt around pt's waist to maintain seated balance   Standing balance support: Bilateral upper extremity supported;During functional activity Standing balance-Leahy Scale: Poor Standing balance comment: Required bil UE support for static standing balance; unable to perform dynamic balance this session                             Pertinent Vitals/Pain Pain Assessment: Faces Faces Pain Scale: Hurts little more Pain Location: R Distal femur  Pain Descriptors / Indicators: Grimacing;Guarding Pain Intervention(s): Limited activity within patient's tolerance;Monitored during session    Home Living                        Prior Function           Comments: Pt's daughter reports she is unsure how much assistance was required prior  to fall. Believes son has been assisting pt alot more in past few weeks with mobility, but need to confirm with pt's son.     Hand Dominance        Extremity/Trunk Assessment   Upper Extremity Assessment Upper Extremity Assessment: Defer to OT evaluation    Lower Extremity Assessment Lower Extremity Assessment: RLE deficits/detail RLE Deficits / Details: s/p ORIF in distal femur     Cervical / Trunk Assessment Cervical / Trunk Assessment: Other exceptions Cervical / Trunk Exceptions: difficult to assess  Communication   Communication: No difficulties(Pleasently confused)  Cognition Arousal/Alertness: Awake/alert Behavior During Therapy:  Anxious Overall Cognitive Status: History of cognitive impairments - at baseline                                 General Comments: Pt's daughter reporting she believes pt's cognitive function has been getting worse but unsure of the extent without speaking to the pt's son who lived with her.      General Comments General comments (skin integrity, edema, etc.): Pt's daughter present for session; asked daughter to step out during transfer    Exercises     Assessment/Plan    PT Assessment Patient needs continued PT services  PT Problem List Decreased strength;Decreased range of motion;Decreased activity tolerance;Decreased balance;Decreased mobility;Decreased coordination;Decreased cognition;Decreased knowledge of use of DME;Decreased safety awareness;Decreased knowledge of precautions;Pain       PT Treatment Interventions DME instruction;Gait training;Functional mobility training;Therapeutic activities;Therapeutic exercise;Balance training;Patient/family education;Modalities;Wheelchair mobility training    PT Goals (Current goals can be found in the Care Plan section)  Acute Rehab PT Goals Patient Stated Goal: to avoid another fall  PT Goal Formulation: With patient Time For Goal Achievement: 03/24/18 Potential to Achieve Goals: Fair    Frequency Min 3X/week   Barriers to discharge        Co-evaluation               AM-PAC PT "6 Clicks" Daily Activity  Outcome Measure Difficulty turning over in bed (including adjusting bedclothes, sheets and blankets)?: Unable Difficulty moving from lying on back to sitting on the side of the bed? : Unable Difficulty sitting down on and standing up from a chair with arms (e.g., wheelchair, bedside commode, etc,.)?: Unable Help needed moving to and from a bed to chair (including a wheelchair)?: Total Help needed walking in hospital room?: Total Help needed climbing 3-5 steps with a railing? : Total 6 Click Score: 6     End of Session Equipment Utilized During Treatment: Gait belt Activity Tolerance: Patient limited by pain Patient left: in chair;with call bell/phone within reach;with chair alarm set;with family/visitor present Nurse Communication: Need for lift equipment;Mobility status PT Visit Diagnosis: Other abnormalities of gait and mobility (R26.89);Muscle weakness (generalized) (M62.81);History of falling (Z91.81);Difficulty in walking, not elsewhere classified (R26.2);Pain;Unsteadiness on feet (R26.81) Pain - Right/Left: Right Pain - part of body: Leg    Time: 2993-7169 PT Time Calculation (min) (ACUTE ONLY): 45 min   Charges:   PT Evaluation $PT Eval Moderate Complexity: 1 Mod PT Treatments $Gait Training: 23-37 mins        Einar Crow, [place]  Student Physical Therapist Acute Rehab [phone]   Einar Crow 03/17/2018, 3:13 PM

## 2018-03-17 NOTE — Progress Notes (Signed)
PROGRESS NOTE  Carrie Crawford BRA:[id number] DOB: 1925-09-24 DOA: 03/15/2018 PCP: Eustaquio Maize, MD  HPI/Recap of past 24 hours:  She does not remember she has surgery yesterday,  she denies pain, no fever She is sitting up in chair, talking to her family  RN reports patient coughed at night, patient reports this is chronic  son at bedside  Assessment/Plan: Principal Problem:   Right femoral fracture ([place]) Active Problems:   Obstructive chronic bronchitis without exacerbation ([place])   Hypertension   Hyperlipidemia with target LDL less than 130   Hyperglycemia   Anemia   Hyponatremia  Right supracondylar distal femur fracture  -s/p Open reduction internal fixation of right distal femur fracture on 9/4 by ortho Dr Doreatha Martin - per DR Haddix "Patient will be weightbearing as tolerated to right lower extremity.  She will receive Ancef for postoperative surgical prophylaxis.  Will recommend Lovenox for DVT prophylaxis.  She will mobilize with physical and occupational therapy"  Hyponatremia Appear chronic for years, baseline around 127-130 sHe has been on lasix at home, currently appear dehydrated, lasix held, received  gentle hydration She has mild wheezing on physical exam today, she is now off ivf, will get cxr   HTN Home meds cozaar/lasix  chronic bronchitis, rhinitis, never smoker Continue home meds on breo/spiriva/nasal spry at home  Dementia? With impaired memory, baseline not oriented to time, but to place and person  Code Status: full  Family Communication: patient and son at bedside  Disposition Plan: snf when bed available, hopefully on 9/6   Consultants:  orthopedics  Procedures:  s/p Open reduction internal fixation of right distal femur fracture on 9/4 by ortho Dr Doreatha Martin  Antibiotics:  perioperative   Objective: BP (!) 119/54 (BP Location: Left Arm)   Pulse 87   Temp 98.3 F (36.8 C) (Oral)   Resp 16   Ht 5\' 2"  (1.575 m)   Wt 90.7 kg    SpO2 95%   BMI 36.58 kg/m   Intake/Output Summary (Last 24 hours) at 03/17/2018 1053 Last data filed at 03/17/2018 0900 Gross per 24 hour  Intake 650 ml  Output 175 ml  Net 475 ml   Filed Weights   03/15/18 0129  Weight: 90.7 kg    Exam: Patient is examined daily including today on 03/17/2018, exams remain the same as of yesterday except that has changed    General:  NAD, calm and pleasant, baseline dementia  Cardiovascular: RRR  Respiratory: mild wheezing at basis  Abdomen: Soft/ND/NT, positive BS  Musculoskeletal: No Edema, right leg post op changes  Neuro: alert, oriented to place and person, not oriented to time, she has impaired memory  Data Reviewed: Basic Metabolic Panel: Recent Labs  Lab 03/15/18 0426 03/16/18 0430 03/17/18 0835  NA 129* 129* 130*  K 3.7 4.1 4.2  CL 95* 100 95*  CO2 27 23 28   GLUCOSE 129* 115* 118*  BUN 12 5* 11  CREATININE 0.66 0.55 0.73  CALCIUM 8.2* 7.7* 7.9*  MG  --   --  2.1   Liver Function Tests: No results for input(s): AST, ALT, ALKPHOS, BILITOT, PROT, ALBUMIN in the last 168 hours. No results for input(s): LIPASE, AMYLASE in the last 168 hours. No results for input(s): AMMONIA in the last 168 hours. CBC: Recent Labs  Lab 03/15/18 0426 03/16/18 0430 03/17/18 0835  WBC 10.2 7.4 9.5  NEUTROABS 8.5*  --  7.0  HGB 11.8* 11.2* 9.8*  HCT 35.1* 34.0* 29.7*  MCV 92.4  93.2 93.4  PLT 226 203 190   Cardiac Enzymes:   No results for input(s): CKTOTAL, CKMB, CKMBINDEX, TROPONINI in the last 168 hours. BNP (last 3 results) No results for input(s): BNP in the last 8760 hours.  ProBNP (last 3 results) No results for input(s): PROBNP in the last 8760 hours.  CBG: Recent Labs  Lab 03/16/18 0040 03/16/18 0606  GLUCAP 97 114*    Recent Results (from the past 240 hour(s))  Surgical PCR screen     Status: None   Collection Time: 03/15/18  8:21 PM  Result Value Ref Range Status   MRSA, PCR NEGATIVE NEGATIVE Final    Staphylococcus aureus NEGATIVE NEGATIVE Final    Comment: (NOTE) The Xpert SA Assay (FDA approved for NASAL specimens in patients 38 years of age and older), is one component of a comprehensive surveillance program. It is not intended to diagnose infection nor to guide or monitor treatment. Performed at [facility], [place] [address] [place], [place] [address]      Studies: Dg Femur [place], [place] 2 Views Right  Result Date: 03/16/2018 CLINICAL DATA:  ORIF distal RIGHT femoral fracture EXAM: RIGHT FEMUR 2 VIEWS; RIGHT FEMUR PORTABLE 2 VIEW; DG C-ARM 61-120 MIN COMPARISON:  Preoperative evaluation 03/15/2018 FLUOROSCOPY TIME:  2 minutes 12 seconds Images obtained: 8 intraoperatively, 3 postoperatively FINDINGS: RIGHT femur 8 views: Digital C-arm fluoroscopic images obtained intraoperatively demonstrate sequential reduction of previously identified distal RIGHT femoral oblique metadiaphyseal fracture and placement of 2 cannulated screws. Additionally, a lateral plate and multiple screws was placed extending from the proximal to mid RIGHT femoral diaphysis to the lateral femoral condyle. Knee joint alignment normal. RIGHT knee two views: Images obtained postoperatively portably in recovery demonstrate lateral plate and multiple screws across a reduced oblique fracture of the distal RIGHT femoral diaphysis. An additional nondisplaced fracture plane seen extending into the mid femoral diaphysis. Knee joint alignment normal no degenerative changes and knee joint are present. Hip joint space is preserved. There is no evidence of fracture or other focal bone lesions. Soft tissues are unremarkable. IMPRESSION: Intraoperative and postoperative images of the RIGHT femur demonstrating ORIF for previously identified distal RIGHT femur fracture. Note is made of an additional oblique fracture plane extending into the mid to distal RIGHT femoral diaphysis, better visualized than on preoperative exam. Electronically  Signed   By: Lavonia Dana M.D.   On: 03/16/2018 13:42    Scheduled Meds: . aspirin EC  81 mg Oral Daily  . azelastine  1 spray Each Nare BID  . calcium-vitamin D  1 tablet Oral BID  . enoxaparin (LOVENOX) injection  40 mg Subcutaneous Q24H  . fluticasone  1 spray Each Nare Daily  . fluticasone furoate-vilanterol  1 puff Inhalation Daily  . losartan  100 mg Oral Daily  . multivitamin with minerals  1 tablet Oral Daily  . mupirocin ointment  1 application Nasal BID  . tiotropium  18 mcg Inhalation Daily    Continuous Infusions:    Time spent: 51mins I have personally reviewed and interpreted on  03/17/2018 daily labs, imagings as discussed above under date review session and assessment and plans.  I reviewed all nursing notes, pharmacy notes, consultant notes,  vitals, pertinent old records  I have discussed plan of care as described above with RN , patient and family on 03/17/2018   Florencia Reasons MD, PhD  [facility] Pager [phone]. If 7PM-7AM, please contact night-coverage at [email or web address], password [facility] 03/17/2018, 10:53 AM  LOS: 2 days

## 2018-03-17 NOTE — Evaluation (Signed)
Occupational Therapy Evaluation Patient Details Name: Carrie Crawford MRN: [id number] DOB: February 06, 1926 Today's Date: 03/17/2018    History of Present Illness Pt is a 84 y.o. female s/p ORIF in R distal femur secondary to a fall. Pt is WBAT on RLE. PMH is significant for COPD, hyperlipidemia, HTN, OP, and chronic rhinitis.   Clinical Impression   Pt with impaired baseline cognition, presents with generalized weakness, decreased balance and pain. She requires +2 max assist for mobility and use of lift equipment for OOB. Pt needs set up to total assist for ADL. She will need post acute rehab in SNF prior to return home with her supportive family.     Follow Up Recommendations  SNF;Supervision/Assistance - 24 hour    Equipment Recommendations  Other (comment)(defer to next venue)    Recommendations for Other Services       Precautions / Restrictions Precautions Precautions: Fall Restrictions Weight Bearing Restrictions: Yes RLE Weight Bearing: Weight bearing as tolerated      Mobility Bed Mobility Overal bed mobility: Needs Assistance Bed Mobility: Sit to Supine       Sit to supine: Total assist;+2 for physical assistance;HOB elevated   General bed mobility comments: assist to guide trunk and for B LEs to return to bed  Transfers Overall transfer level: Needs assistance   Transfers: Sit to/from Stand Sit to Stand: +2 physical assistance;Max assist         General transfer comment: extensive assistance to rise and tuck buttocks for stedy flaps to be placed/removed    Balance Overall balance assessment: Needs assistance Sitting-balance support: Single extremity supported;Feet supported Sitting balance-Leahy Scale: Poor Sitting balance - Comments: min to min guard assist Postural control: Posterior lean Standing balance support: Bilateral upper extremity supported;During functional activity Standing balance-Leahy Scale: Poor Standing balance comment: Required bil UE  support for static standing balance; unable to perform dynamic balance this session                           ADL either performed or assessed with clinical judgement   ADL Overall ADL's : Needs assistance/impaired Eating/Feeding: Set up;Sitting   Grooming: Wash/dry hands;Sitting;Min guard   Upper Body Bathing: Moderate assistance;Sitting   Lower Body Bathing: Total assistance;+2 for physical assistance;Sit to/from stand   Upper Body Dressing : Minimal assistance;Sitting   Lower Body Dressing: Total assistance;+2 for physical assistance;Sit to/from stand   Toilet Transfer: +2 for physical assistance;Total assistance;BSC Toilet Transfer Details (indicate cue type and reason): used stedy to transfer pt Toileting- Clothing Manipulation and Hygiene: +2 for physical assistance;Total assistance;Sit to/from stand       Functional mobility during ADLs: (pt unable to ambulate)       Vision Patient Visual Report: No change from baseline       Perception     Praxis      Pertinent Vitals/Pain Pain Assessment: Faces Faces Pain Scale: Hurts little more Pain Location: R LE Pain Descriptors / Indicators: Grimacing;Guarding Pain Intervention(s): Monitored during session;Patient requesting pain meds-RN notified     Hand Dominance Right   Extremity/Trunk Assessment Upper Extremity Assessment Upper Extremity Assessment: Generalized weakness   Lower Extremity Assessment Lower Extremity Assessment: Defer to PT evaluation RLE Deficits / Details: s/p ORIF in distal femur    Cervical / Trunk Assessment Cervical / Trunk Assessment: Other exceptions Cervical / Trunk Exceptions: obesity   Communication Communication Communication: No difficulties(Pleasently confused)   Cognition Arousal/Alertness: Awake/alert Behavior During Therapy: WFL for  tasks assessed/performed Overall Cognitive Status: History of cognitive impairments - at baseline                                  General Comments: pt with poor memory and direction following   General Comments      Exercises     Shoulder Instructions      Home Living Family/patient expects to be discharged to:: Skilled nursing facility Living Arrangements: Children                               Additional Comments: family is agreeable to snf      Prior Functioning/Environment Level of Independence: Needs assistance    ADL's / Homemaking Assistance Needed: assisted for bathing and dressing, self fed            OT Problem List: Decreased strength;Impaired balance (sitting and/or standing);Decreased cognition;Decreased knowledge of use of DME or AE;Obesity;Pain      OT Treatment/Interventions:      OT Goals(Current goals can be found in the care plan section) Acute Rehab OT Goals Patient Stated Goal: to avoid another fall   OT Frequency:     Barriers to D/C:            Co-evaluation              AM-PAC PT "6 Clicks" Daily Activity     Outcome Measure Help from another person eating meals?: A Little Help from another person taking care of personal grooming?: A Little Help from another person toileting, which includes using toliet, bedpan, or urinal?: Total Help from another person bathing (including washing, rinsing, drying)?: A Lot Help from another person to put on and taking off regular upper body clothing?: A Little Help from another person to put on and taking off regular lower body clothing?: Total 6 Click Score: 13   End of Session Equipment Utilized During Treatment: Gait belt;Other (comment)(stedy) Nurse Communication: Patient requests pain meds  Activity Tolerance: Patient tolerated treatment well Patient left: in bed;with bed alarm set;with call bell/phone within reach;with family/visitor present  OT Visit Diagnosis: Pain;Other symptoms and signs involving cognitive function;Unsteadiness on feet (R26.81);Muscle weakness (generalized) (M62.81)                 Time: 6761-9509 OT Time Calculation (min): 20 min Charges:  OT General Charges $OT Visit: 1 Visit OT Evaluation $OT Eval Moderate Complexity: 1 Mod  03/17/2018 Nestor Lewandowsky, OTR/L Pager: [phone]  Werner Lean, Haze Boyden 03/17/2018, 3:46 PM

## 2018-03-18 ENCOUNTER — Inpatient Hospital Stay
Admission: RE | Admit: 2018-03-18 | Discharge: 2018-04-09 | Disposition: A | Discharge status: Other Acute Inpatient Hospital | Payer: Medicare HMO | Source: Ambulatory Visit | Attending: Internal Medicine | Admitting: Internal Medicine

## 2018-03-18 DIAGNOSIS — R0602 Shortness of breath: Secondary | ICD-10-CM | POA: Diagnosis not present

## 2018-03-18 DIAGNOSIS — S72409A Unspecified fracture of lower end of unspecified femur, initial encounter for closed fracture: Secondary | ICD-10-CM | POA: Diagnosis not present

## 2018-03-18 DIAGNOSIS — Z7401 Bed confinement status: Secondary | ICD-10-CM | POA: Diagnosis not present

## 2018-03-18 DIAGNOSIS — E669 Obesity, unspecified: Secondary | ICD-10-CM | POA: Diagnosis not present

## 2018-03-18 DIAGNOSIS — S72401D Unspecified fracture of lower end of right femur, subsequent encounter for closed fracture with routine healing: Secondary | ICD-10-CM | POA: Diagnosis not present

## 2018-03-18 DIAGNOSIS — R05 Cough: Secondary | ICD-10-CM | POA: Diagnosis not present

## 2018-03-18 DIAGNOSIS — I5030 Unspecified diastolic (congestive) heart failure: Secondary | ICD-10-CM | POA: Diagnosis not present

## 2018-03-18 DIAGNOSIS — J189 Pneumonia, unspecified organism: Secondary | ICD-10-CM | POA: Diagnosis not present

## 2018-03-18 DIAGNOSIS — Z7982 Long term (current) use of aspirin: Secondary | ICD-10-CM | POA: Diagnosis not present

## 2018-03-18 DIAGNOSIS — N3281 Overactive bladder: Secondary | ICD-10-CM | POA: Diagnosis not present

## 2018-03-18 DIAGNOSIS — Y92009 Unspecified place in unspecified non-institutional (private) residence as the place of occurrence of the external cause: Secondary | ICD-10-CM | POA: Diagnosis not present

## 2018-03-18 DIAGNOSIS — S72431S Displaced fracture of medial condyle of right femur, sequela: Secondary | ICD-10-CM | POA: Diagnosis not present

## 2018-03-18 DIAGNOSIS — Z23 Encounter for immunization: Secondary | ICD-10-CM | POA: Diagnosis not present

## 2018-03-18 DIAGNOSIS — R531 Weakness: Secondary | ICD-10-CM | POA: Diagnosis not present

## 2018-03-18 DIAGNOSIS — D649 Anemia, unspecified: Secondary | ICD-10-CM | POA: Diagnosis not present

## 2018-03-18 DIAGNOSIS — R41841 Cognitive communication deficit: Secondary | ICD-10-CM | POA: Diagnosis not present

## 2018-03-18 DIAGNOSIS — R2689 Other abnormalities of gait and mobility: Secondary | ICD-10-CM | POA: Diagnosis not present

## 2018-03-18 DIAGNOSIS — M2550 Pain in unspecified joint: Secondary | ICD-10-CM | POA: Diagnosis not present

## 2018-03-18 DIAGNOSIS — Z79899 Other long term (current) drug therapy: Secondary | ICD-10-CM | POA: Diagnosis not present

## 2018-03-18 DIAGNOSIS — J449 Chronic obstructive pulmonary disease, unspecified: Secondary | ICD-10-CM | POA: Diagnosis not present

## 2018-03-18 DIAGNOSIS — S79929A Unspecified injury of unspecified thigh, initial encounter: Secondary | ICD-10-CM | POA: Diagnosis not present

## 2018-03-18 DIAGNOSIS — I5033 Acute on chronic diastolic (congestive) heart failure: Secondary | ICD-10-CM | POA: Diagnosis not present

## 2018-03-18 DIAGNOSIS — F0390 Unspecified dementia without behavioral disturbance: Secondary | ICD-10-CM | POA: Diagnosis not present

## 2018-03-18 DIAGNOSIS — I110 Hypertensive heart disease with heart failure: Secondary | ICD-10-CM | POA: Diagnosis not present

## 2018-03-18 DIAGNOSIS — R262 Difficulty in walking, not elsewhere classified: Secondary | ICD-10-CM | POA: Diagnosis not present

## 2018-03-18 DIAGNOSIS — E785 Hyperlipidemia, unspecified: Secondary | ICD-10-CM | POA: Diagnosis not present

## 2018-03-18 DIAGNOSIS — W19XXXA Unspecified fall, initial encounter: Secondary | ICD-10-CM | POA: Diagnosis not present

## 2018-03-18 DIAGNOSIS — E871 Hypo-osmolality and hyponatremia: Secondary | ICD-10-CM | POA: Diagnosis not present

## 2018-03-18 DIAGNOSIS — I5032 Chronic diastolic (congestive) heart failure: Secondary | ICD-10-CM | POA: Diagnosis not present

## 2018-03-18 DIAGNOSIS — I509 Heart failure, unspecified: Secondary | ICD-10-CM | POA: Diagnosis not present

## 2018-03-18 DIAGNOSIS — R0902 Hypoxemia: Secondary | ICD-10-CM | POA: Diagnosis not present

## 2018-03-18 DIAGNOSIS — C3490 Malignant neoplasm of unspecified part of unspecified bronchus or lung: Secondary | ICD-10-CM | POA: Diagnosis not present

## 2018-03-18 DIAGNOSIS — M6281 Muscle weakness (generalized): Secondary | ICD-10-CM | POA: Diagnosis not present

## 2018-03-18 DIAGNOSIS — W010XXA Fall on same level from slipping, tripping and stumbling without subsequent striking against object, initial encounter: Secondary | ICD-10-CM | POA: Diagnosis not present

## 2018-03-18 DIAGNOSIS — I10 Essential (primary) hypertension: Secondary | ICD-10-CM | POA: Diagnosis not present

## 2018-03-18 DIAGNOSIS — M810 Age-related osteoporosis without current pathological fracture: Secondary | ICD-10-CM | POA: Diagnosis not present

## 2018-03-18 DIAGNOSIS — F0150 Vascular dementia without behavioral disturbance: Secondary | ICD-10-CM | POA: Diagnosis not present

## 2018-03-18 LAB — BASIC METABOLIC PANEL
ANION GAP: 9 (ref 5–15)
BUN: 11 mg/dL (ref 8–23)
CALCIUM: 8 mg/dL — AB (ref 8.9–10.3)
CO2: 25 mmol/L (ref 22–32)
Chloride: 96 mmol/L — ABNORMAL LOW (ref 98–111)
Creatinine, Ser: 0.55 mg/dL (ref 0.44–1.00)
GFR calc Af Amer: >60 mL/min (ref >60)
GFR calc non Af Amer: >60 mL/min (ref >60)
Glucose, Bld: 112 mg/dL — ABNORMAL HIGH (ref 70–99)
Potassium: 5 mmol/L (ref 3.5–5.1)
Sodium: 130 mmol/L — ABNORMAL LOW (ref 135–145)

## 2018-03-18 LAB — CBC
HCT: 31.5 % — ABNORMAL LOW (ref 36.0–46.0)
HEMOGLOBIN: 10.7 g/dL — AB (ref 12.0–15.0)
MCH: 31.2 pg (ref 26.0–34.0)
MCHC: 34 g/dL (ref 30.0–36.0)
MCV: 91.8 fL (ref 78.0–100.0)
Platelets: 206 10*3/uL (ref 150–400)
RBC: 3.43 MIL/uL — ABNORMAL LOW (ref 3.87–5.11)
RDW: 13.1 % (ref 11.5–15.5)
WBC: 8.5 10*3/uL (ref 4.0–10.5)

## 2018-03-18 MED ORDER — FUROSEMIDE 20 MG PO TABS: 20.0000 mg | ORAL_TABLET | ORAL | 0 refills | Status: DC

## 2018-03-18 MED ORDER — ENOXAPARIN SODIUM 40 MG/0.4ML ~~LOC~~ SOLN: 40.0000 mg | SUBCUTANEOUS | 0 refills | Status: DC

## 2018-03-18 MED ORDER — SENNOSIDES-DOCUSATE SODIUM 8.6-50 MG PO TABS: 1.0000 | ORAL_TABLET | Freq: Every day | ORAL | 0 refills | Status: AC

## 2018-03-18 MED ORDER — OXYCODONE HCL 5 MG PO TABS: 5.0000 mg | ORAL_TABLET | Freq: Four times a day (QID) | ORAL | 0 refills | Status: DC | PRN

## 2018-03-18 NOTE — Discharge Summary (Addendum)
Discharge Summary  Carrie Crawford MWU:[id number] DOB: 07-01-1926  PCP: Eustaquio Maize, MD  Admit date: 03/15/2018 Discharge date: 03/18/2018  Time spent: 42mins, more than 50% time spent on coordination of care  Recommendations for Outpatient Follow-up:  1. F/u with SNF MD for hospital discharge follow up, repeat cbc/bmp at follow up 2. F/u with orthopedics Dr Doreatha Martin in two weeks  Discharge Diagnoses:  Active Hospital Problems   Diagnosis Date Noted  . Right femoral fracture ([facility]) 03/15/2018  . Anemia 03/15/2018  . Hyponatremia 03/15/2018  . Hyperglycemia 04/29/2017  . Hyperlipidemia with target LDL less than 130   . Hypertension 04/26/2013  . Obstructive chronic bronchitis without exacerbation ([place]) 01/16/2011    Resolved Hospital Problems  No resolved problems to display.    Discharge Condition: stable  Diet recommendation: regular diet  Filed Weights   03/15/18 0129  Weight: 90.7 kg    History of present illness: (per admitting MD Dr Olevia Bowens) PCP: Eustaquio Maize, MD   Patient coming from: Home.  I have personally briefly reviewed patient's old medical records in [place]  Chief Complaint: Fall.  HPI: Carrie Crawford is a 84 y.o. female with medical history significant of COPD, hyperlipidemia, hypertension, multiple environmental allergies, osteopenia, chronic rhinitis who is brought to the emergency department via EMS after having a mechanical fall at home while she was trying to get to the bathroom.  She denies fever, chills, sore throat, worsening dyspnea, chest pain, palpitations, dizziness, diaphoresis, PND orthopnea.  No abdominal pain, nausea, emesis, diarrhea, constipation, melena or hematochezia.  Denies dysuria, frequency or hematuria.  No polyuria, polydipsia or blurred vision.  ED Course: Initial vital signs temperature 98.2 F, pulse 94, respirations 20, blood pressure 181/76 mmHg and O2 sat 94% on room air.  She was given a tablet of  hydrocodone 5 mg with acetaminophen 325 mg p.o. x1.  She states that her pain is much better.  Work-up shows a white count of 10.2, hemoglobin 11.8 and platelets 226.  PT was 13.8 and INR 1.07.  Her sodium 129, potassium 3.7, chloride 95 and CO2 27 mmol/L.  BUN was 12, creatinine 0.66, calcium 8.2 and glucose 129 mg/dL.  Left knee radiograph show some osteoarthritis, but no fracture.  Right hip and pelvis showed lower lumbar DDD.  No fracture was seen.  However right knee x-ray shows an acute oblique distal femoral diaphyseal fracture exiting the lateral femoral meta physis with resultant medial displacement of the distal fracture fragment by half a shaft width.  Please see images and full radiology report for further detail.  Hospital Course:  Principal Problem:   Right femoral fracture (HCC) Active Problems:   Obstructive chronic bronchitis without exacerbation (HCC)   Hypertension   Hyperlipidemia with target LDL less than 130   Hyperglycemia   Anemia   Hyponatremia   Right supracondylar distal femur fracture -s/p Open reduction internal fixation of right distal femur fracture on 9/4 by ortho Dr Doreatha Martin - per DR Haddix "Patient will be weightbearing as tolerated to right lower extremity. She will receive Ancef for postoperative surgical prophylaxis. Will recommend Lovenox for DVT prophylaxis. She will mobilize with physical and occupational therapy" -she is to follow up with Dr Doreatha Martin in two weeks  Hyponatremia Appear chronic for years, baseline around 127-130 sHe has been on lasix at home, currently appear dehydrated, lasix held, received  gentle hydration She has mild wheezing on physical exam today, she is now off ivf,  cxr no acute  infiltrate Mild wheezing resolved at ivf d/ced She is discharged on reduced dose of lasix ( from lasix 20mg  daily to 20mg  qmwf)   HTN On  Cozaar/lasix  Chronic diastolic chf euvolemic at discharge She is discharged on reduced dose of  lasix Close monitor volume status, further lasix dose adjustment per snf MD.  chronic bronchitis, rhinitis, never smoker Continue home meds on breo/spiriva/nasal spry at home  Dementia? With impaired memory, baseline not oriented to time, but to place and person  Code Status: full  Family Communication: patient and duaghter at bedside  Disposition Plan: snf    Consultants:  orthopedics  Procedures:  s/p Open reduction internal fixation of right distal femur fracture on 9/4 by ortho Dr Doreatha Martin  Antibiotics:  perioperative   Discharge Exam: BP (!) 144/76 (BP Location: Left Arm)   Pulse (!) 101   Temp 99.3 F (37.4 C) (Oral)   Resp 16   Ht 5\' 2"  (1.575 m)   Wt 90.7 kg   SpO2 93%   BMI 36.58 kg/m    General:  NAD, calm and pleasant, baseline dementia  Cardiovascular: RRR  Respiratory: CTABL  Abdomen: Soft/ND/NT, positive BS  Musculoskeletal: No Edema, right leg post op changes  Neuro: alert, oriented to place and person, not oriented to time, she has impaired memory   Discharge Instructions You were cared for by a hospitalist during your hospital stay. If you have any questions about your discharge medications or the care you received while you were in the hospital after you are discharged, you can call the unit and asked to speak with the hospitalist on call if the hospitalist that took care of you is not available. Once you are discharged, your primary care physician will handle any further medical issues. Please note that NO REFILLS for any discharge medications will be authorized once you are discharged, as it is imperative that you return to your primary care physician (or establish a relationship with a primary care physician if you do not have one) for your aftercare needs so that they can reassess your need for medications and monitor your lab values.  Discharge Instructions    Diet general   Complete by:  As directed    Increase activity  slowly   Complete by:  As directed    weightbearing as tolerated to right lower extremity     Allergies as of 03/18/2018   No Known Allergies     Medication List    TAKE these medications   aspirin 81 MG tablet Take 81 mg by mouth daily.   azelastine 0.1 % nasal spray Commonly known as:  ASTELIN 2 sprays in each nostril at bedtime.   Calcium Carbonate-Vitamin D 600-400 MG-UNIT tablet Take 1 tablet by mouth 2 (two) times daily.   enoxaparin 40 MG/0.4ML injection Commonly known as:  LOVENOX Inject 0.4 mLs (40 mg total) into the skin daily. Start taking on:  03/19/2018   fluticasone 50 MCG/ACT nasal spray Commonly known as:  FLONASE 2 SPRAYS IN EACH NOSTRIL QD   fluticasone furoate-vilanterol 100-25 MCG/INH Aepb Commonly known as:  BREO ELLIPTA INHALE 1 PUFF ONCE DAILY AS DIRECTED   furosemide 20 MG tablet Commonly known as:  LASIX Take 1 tablet (20 mg total) by mouth every Monday, Wednesday, and Friday. What changed:  when to take this   losartan 100 MG tablet Commonly known as:  COZAAR Take 1 tablet (100 mg total) by mouth daily.   multivitamin with minerals tablet Take 1  tablet by mouth daily.   oxyCODONE 5 MG immediate release tablet Commonly known as:  Oxy IR/ROXICODONE Take 1 tablet (5 mg total) by mouth every 6 (six) hours as needed for breakthrough pain ((for MODERATE breakthrough pain)).   senna-docusate 8.6-50 MG tablet Commonly known as:  Senokot-S Take 1 tablet by mouth at bedtime for 7 days.   tiotropium 18 MCG inhalation capsule Commonly known as:  SPIRIVA INHALE THE CONTENTS OF ONE CAPSULE ONCE DAILY      No Known Allergies  Contact information for follow-up providers    Haddix, Thomasene Lot, MD Follow up in 2 week(s).   Specialty:  Orthopedic Surgery Why:  suture removal and x-rays Contact information: [address] [place] [address] [phone]        Eustaquio Maize, MD Follow up.   Specialty:  Pediatrics Contact  information: [place] [address] [phone]            Contact information for after-discharge care    [facility] .   Service:  Skilled Nursing Contact information: [address] [place] [phone]                   The results of significant diagnostics from this hospitalization (including imaging, microbiology, ancillary and laboratory) are listed below for reference.    Significant Diagnostic Studies: Dg Chest Port 1 View  Result Date: 03/17/2018 CLINICAL DATA:  Cough today, some shortness of breath, recent leg surgery, history CHF, hypertension EXAM: PORTABLE CHEST 1 VIEW COMPARISON:  Portable exam 0914 hours compared to 03/15/2017 FINDINGS: Enlargement of cardiac silhouette with slight pulmonary vascular congestion. Atherosclerotic calcification aorta. Mediastinal contours stable. Interstitial infiltrates slightly increased since previous exam question minimal pulmonary edema. Bibasilar atelectasis. No segmental consolidation or pneumothorax. Bones demineralized. IMPRESSION: Enlargement of cardiac silhouette with bibasilar atelectasis and slightly increased perihilar infiltrates question minimal pulmonary edema. Electronically Signed   By: Lavonia Dana M.D.   On: 03/17/2018 11:30   Chest Portable 1 View  Result Date: 03/15/2018 CLINICAL DATA:  Preoperative evaluation, fall this morning, hypertension, COPD EXAM: PORTABLE CHEST 1 VIEW COMPARISON:  Portable exam 0650 hours compared to 02/06/2016 FINDINGS: Enlargement of cardiac silhouette with pulmonary vascular congestion. Atherosclerotic calcification aorta. Bronchitic changes with LEFT basilar opacity question atelectasis versus infiltrate. Minimal RIGHT basilar atelectasis. Upper lungs clear. No gross pleural effusion or pneumothorax. Bones demineralized. IMPRESSION: Enlargement of cardiac silhouette with mild RIGHT basilar atelectasis  and either atelectasis or consolidation at LEFT base. Electronically Signed   By: Lavonia Dana M.D.   On: 03/15/2018 07:09   Dg Knee Complete 4 Views Left  Result Date: 03/15/2018 CLINICAL DATA:  Left knee pain after fall EXAM: LEFT KNEE - COMPLETE 4+ VIEW COMPARISON:  None. FINDINGS: No acute fracture nor joint dislocation. Subchondral areas of osteopenia involving the weight-bearing portion of the medial femoral condyle may reflect chondral fissuring and/or thinning. Degenerative spurring is noted of the tibial spines. No joint effusion. IMPRESSION: Mild femorotibial joint osteoarthritis.  No acute fracture. Electronically Signed   By: [name] Royalty M.D.   On: 03/15/2018 03:43   Dg Knee Complete 4 Views Right  Result Date: 03/15/2018 CLINICAL DATA:  Patient fell today and has right knee pain. EXAM: RIGHT KNEE - COMPLETE 4+ VIEW COMPARISON:  None. FINDINGS: Acute oblique displaced closed fracture of the distal right femoral diaphysis extending into the metaphysis. There is of the  16 mm of medial displacement of the femoral condyles and distal fracture fragment relative to the femoral diaphysis. No intra-articular extension is identified. Osteoarthritic joint space narrowing of the femorotibial compartment is seen. The patellofemoral appears intact. No joint effusion. IMPRESSION: Acute, closed, oblique fracture of the distal femoral diaphysis exiting out the lateral femoral metaphysis and with resultant 16 mm of medial displacement of the femoral condyles and distal fracture fragment. Electronically Signed   By: [name] Royalty M.D.   On: 03/15/2018 03:41   Dg C-arm 1-60 Min  Result Date: 03/16/2018 CLINICAL DATA:  ORIF distal RIGHT femoral fracture EXAM: RIGHT FEMUR 2 VIEWS; RIGHT FEMUR PORTABLE 2 VIEW; DG C-ARM 61-120 MIN COMPARISON:  Preoperative evaluation 03/15/2018 FLUOROSCOPY TIME:  2 minutes 12 seconds Images obtained: 8 intraoperatively, 3 postoperatively FINDINGS: RIGHT femur 8 views: Digital C-arm  fluoroscopic images obtained intraoperatively demonstrate sequential reduction of previously identified distal RIGHT femoral oblique metadiaphyseal fracture and placement of 2 cannulated screws. Additionally, a lateral plate and multiple screws was placed extending from the proximal to mid RIGHT femoral diaphysis to the lateral femoral condyle. Knee joint alignment normal. RIGHT knee two views: Images obtained postoperatively portably in recovery demonstrate lateral plate and multiple screws across a reduced oblique fracture of the distal RIGHT femoral diaphysis. An additional nondisplaced fracture plane seen extending into the mid femoral diaphysis. Knee joint alignment normal no degenerative changes and knee joint are present. Hip joint space is preserved. There is no evidence of fracture or other focal bone lesions. Soft tissues are unremarkable. IMPRESSION: Intraoperative and postoperative images of the RIGHT femur demonstrating ORIF for previously identified distal RIGHT femur fracture. Note is made of an additional oblique fracture plane extending into the mid to distal RIGHT femoral diaphysis, better visualized than on preoperative exam. Electronically Signed   By: Lavonia Dana M.D.   On: 03/16/2018 13:42   Dg Hip Unilat W Or Wo Pelvis 2-3 Views Right  Result Date: 03/15/2018 CLINICAL DATA:  Right hip pain after fall today. EXAM: DG HIP (WITH OR WITHOUT PELVIS) 2-3V RIGHT COMPARISON:  None. FINDINGS: AP view the pelvis demonstrates degenerative disc disease of the included lower lumbar spine with spurring off the endplates from L2 through S1. Osteoarthritis of the SI joints and pubic symphysis without diastasis. No acute pelvic fracture. Left greater than right joint space narrowing of the hips without fracture or joint dislocation. Femoral heads are spherical in appearance without flattening. IMPRESSION: Lower lumbar degenerative disc facet arthropathy. No acute pelvic or hip fracture. Osteoarthritis of  the SI joints, pubic symphysis and left greater than right hip joint space narrowing. Electronically Signed   By: [name] Royalty M.D.   On: 03/15/2018 03:45   Dg Femur, Min 2 Views Right  Result Date: 03/16/2018 CLINICAL DATA:  ORIF distal RIGHT femoral fracture EXAM: RIGHT FEMUR 2 VIEWS; RIGHT FEMUR PORTABLE 2 VIEW; DG C-ARM 61-120 MIN COMPARISON:  Preoperative evaluation 03/15/2018 FLUOROSCOPY TIME:  2 minutes 12 seconds Images obtained: 8 intraoperatively, 3 postoperatively FINDINGS: RIGHT femur 8 views: Digital C-arm fluoroscopic images obtained intraoperatively demonstrate sequential reduction of previously identified distal RIGHT femoral oblique metadiaphyseal fracture and placement of 2 cannulated screws. Additionally, a lateral plate and multiple screws was placed extending from the proximal to mid RIGHT femoral diaphysis to the lateral femoral condyle. Knee joint alignment normal. RIGHT knee two views: Images obtained postoperatively portably in recovery demonstrate lateral plate and multiple screws across a reduced oblique fracture of the distal RIGHT femoral diaphysis. An additional nondisplaced  fracture plane seen extending into the mid femoral diaphysis. Knee joint alignment normal no degenerative changes and knee joint are present. Hip joint space is preserved. There is no evidence of fracture or other focal bone lesions. Soft tissues are unremarkable. IMPRESSION: Intraoperative and postoperative images of the RIGHT femur demonstrating ORIF for previously identified distal RIGHT femur fracture. Note is made of an additional oblique fracture plane extending into the mid to distal RIGHT femoral diaphysis, better visualized than on preoperative exam. Electronically Signed   By: Lavonia Dana M.D.   On: 03/16/2018 13:42   Dg Femur Min 2 Views Right  Result Date: 03/15/2018 CLINICAL DATA:  Pain after fall. EXAM: RIGHT FEMUR 2 VIEWS COMPARISON:  None. FINDINGS: Acute, closed, oblique fracture of the right  distal femoral diaphysis exiting the lateral femoral metaphysis. Resultant medial displacement of the distal fracture fragment which includes both femoral condyles at least 1/2 shaft width. Osteoarthritis of the femorotibial compartment with spurring. No intra-articular extension of fracture. No joint effusion. IMPRESSION: Acute oblique distal femoral diaphyseal fracture exiting the lateral femoral metaphysis with resultant medial displacement of the distal fracture fragment by 1/2 shaft width. Osteoarthritic joint space narrowing of the femorotibial compartment. Electronically Signed   By: [name] Royalty M.D.   On: 03/15/2018 03:47   Dg Femur Port, Min 2 Views Right  Result Date: 03/16/2018 CLINICAL DATA:  ORIF distal RIGHT femoral fracture EXAM: RIGHT FEMUR 2 VIEWS; RIGHT FEMUR PORTABLE 2 VIEW; DG C-ARM 61-120 MIN COMPARISON:  Preoperative evaluation 03/15/2018 FLUOROSCOPY TIME:  2 minutes 12 seconds Images obtained: 8 intraoperatively, 3 postoperatively FINDINGS: RIGHT femur 8 views: Digital C-arm fluoroscopic images obtained intraoperatively demonstrate sequential reduction of previously identified distal RIGHT femoral oblique metadiaphyseal fracture and placement of 2 cannulated screws. Additionally, a lateral plate and multiple screws was placed extending from the proximal to mid RIGHT femoral diaphysis to the lateral femoral condyle. Knee joint alignment normal. RIGHT knee two views: Images obtained postoperatively portably in recovery demonstrate lateral plate and multiple screws across a reduced oblique fracture of the distal RIGHT femoral diaphysis. An additional nondisplaced fracture plane seen extending into the mid femoral diaphysis. Knee joint alignment normal no degenerative changes and knee joint are present. Hip joint space is preserved. There is no evidence of fracture or other focal bone lesions. Soft tissues are unremarkable. IMPRESSION: Intraoperative and postoperative images of the RIGHT femur  demonstrating ORIF for previously identified distal RIGHT femur fracture. Note is made of an additional oblique fracture plane extending into the mid to distal RIGHT femoral diaphysis, better visualized than on preoperative exam. Electronically Signed   By: Lavonia Dana M.D.   On: 03/16/2018 13:42    Microbiology: Recent Results (from the past 240 hour(s))  Surgical PCR screen     Status: None   Collection Time: 03/15/18  8:21 PM  Result Value Ref Range Status   MRSA, PCR NEGATIVE NEGATIVE Final   Staphylococcus aureus NEGATIVE NEGATIVE Final    Comment: (NOTE) The Xpert SA Assay (FDA approved for NASAL specimens in patients 34 years of age and older), is one component of a comprehensive surveillance program. It is not intended to diagnose infection nor to guide or monitor treatment. Performed at [facility], [place] [address] [place], [place] [address]      Labs: Basic Metabolic Panel: Recent Labs  Lab 03/15/18 0426 03/16/18 0430 03/17/18 0835 03/18/18 0509  NA 129* 129* 130* 130*  K 3.7 4.1 4.2 5.0  CL 95* 100 95*  96*  CO2 27 23 28 25   GLUCOSE 129* 115* 118* 112*  BUN 12 5* 11 11  CREATININE 0.66 0.55 0.73 0.55  CALCIUM 8.2* 7.7* 7.9* 8.0*  MG  --   --  2.1  --    Liver Function Tests: No results for input(s): AST, ALT, ALKPHOS, BILITOT, PROT, ALBUMIN in the last 168 hours. No results for input(s): LIPASE, AMYLASE in the last 168 hours. No results for input(s): AMMONIA in the last 168 hours. CBC: Recent Labs  Lab 03/15/18 0426 03/16/18 0430 03/17/18 0835 03/18/18 0655  WBC 10.2 7.4 9.5 8.5  NEUTROABS 8.5*  --  7.0  --   HGB 11.8* 11.2* 9.8* 10.7*  HCT 35.1* 34.0* 29.7* 31.5*  MCV 92.4 93.2 93.4 91.8  PLT 226 203 190 206   Cardiac Enzymes: No results for input(s): CKTOTAL, CKMB, CKMBINDEX, TROPONINI in the last 168 hours. BNP: BNP (last 3 results) No results for input(s): BNP in the last 8760 hours.  ProBNP (last 3 results) No results for input(s):  PROBNP in the last 8760 hours.  CBG: Recent Labs  Lab 03/16/18 0040 03/16/18 0606  GLUCAP 97 114*       Signed:  Florencia Reasons MD, PhD  [facility] 03/18/2018, 3:32 PM

## 2018-03-18 NOTE — Clinical Social Work Note (Signed)
Facility has received authorization. Pt can transfer to SNF today. RN updated.   [place], [place]

## 2018-03-18 NOTE — Clinical Social Work Note (Signed)
Family requesting to speak with CSW at bedside regarding d/c. Pt's family upset that pt is getting moved so late in the day. CSW explained that facility just received insurance authorization. Family upset stating they don't know what the patient will need at the facility and they are unsure of how "emotionally" the pt will handle the transfer. CSW explained that MD has d/c patient at this time. Family requesting call from MD--MD notified. Family requesting if she "has to go" that [facility] set up transport now. Transport is set.   Loletha Grayer, MSW [phone]

## 2018-03-18 NOTE — Clinical Social Work Note (Signed)
Clinical Social Worker facilitated patient discharge including contacting patient family and facility to confirm patient discharge plans.  Clinical information faxed to facility and family agreeable with plan.  CSW arranged ambulance transport via [facility] to [facility] .  RN to call [phone] for report prior to discharge.  Clinical Social Worker will sign off for now as social work intervention is no longer needed. Please consult [place] again if new need arises.  [place], [place]

## 2018-03-18 NOTE — Anesthesia Postprocedure Evaluation (Signed)
Anesthesia Post Note  Patient: Carrie Crawford  Procedure(s) Performed: OPEN REDUCTION INTERNAL FIXATION (ORIF) DISTAL FEMUR FRACTURE (Right Leg Upper)     Patient location during evaluation: PACU Anesthesia Type: Regional and General Level of consciousness: awake and alert Pain management: pain level controlled Vital Signs Assessment: post-procedure vital signs reviewed and stable Respiratory status: spontaneous breathing, nonlabored ventilation, respiratory function stable and patient connected to nasal cannula oxygen Cardiovascular status: blood pressure returned to baseline and stable Postop Assessment: no apparent nausea or vomiting Anesthetic complications: no    Last Vitals:  Vitals:   03/17/18 1432 03/17/18 2003  BP: (!) 142/61 (!) 151/57  Pulse: 94 85  Resp: 16 18  Temp: 36.8 C 36.7 C  SpO2: 93% 95%    Last Pain:  Vitals:   03/18/18 0845  TempSrc:   PainSc: 0-No pain                 [name] S

## 2018-03-18 NOTE — Progress Notes (Signed)
Report called to nurse Estill Bamberg at [facility]. Pt family at bedside awaiting [facility] for transport. Pt resting comfortably without complaint.

## 2018-03-18 NOTE — Progress Notes (Signed)
[facility] is here to transfer pt to SNF. Pt's vital signs are stable; no pain. RN in SNF is notified.

## 2018-03-18 NOTE — Clinical Social Work Placement (Signed)
   CLINICAL SOCIAL WORK PLACEMENT  NOTE  Date:  03/18/2018  Patient Details  Name: Carrie Crawford MRN: [id number] Date of Birth: [birth year]  Clinical Social Work is seeking post-discharge placement for this patient at the [place] level of care (*CSW will initial, date and re-position this form in  chart as items are completed):      Patient/family provided with [facility] Work Department's list of facilities offering this level of care within the geographic area requested by the patient (or if unable, by the patient's family).      Patient/family informed of their freedom to choose among providers that offer the needed level of care, that participate in Medicare, Medicaid or managed care program needed by the patient, have an available bed and are willing to accept the patient.      Patient/family informed of [facility]'s ownership interest in [facility] and [facility], as well as of the fact that they are under no obligation to receive care at these facilities.  PASRR submitted to EDS on       PASRR number received on 03/16/18     Existing PASRR number confirmed on       FL2 transmitted to all facilities in geographic area requested by pt/family on       FL2 transmitted to all facilities within larger geographic area on       Patient informed that his/her managed care company has contracts with or will negotiate with certain facilities, including the following:        Yes   Patient/family informed of bed offers received.  Patient chooses bed at [facility]     Physician recommends and patient chooses bed at      Patient to be transferred to [facility] on 03/18/18.  Patient to be transferred to facility by [facility]     Patient family notified on 03/18/18 of transfer.  Name of family member notified:  [name] and Zigmund Daniel at bedside     PHYSICIAN       Additional Comment:     _______________________________________________ Eileen Stanford, LCSW 03/18/2018, 3:45 PM

## 2018-03-18 NOTE — Care Management Important Message (Signed)
Important Message  Patient Details  Name: Carrie Crawford MRN: [id number] Date of Birth: 02-23-1926   Medicare Important Message Given:  Yes    [name] 03/18/2018, 3:56 PM

## 2018-03-19 ENCOUNTER — Encounter (HOSPITAL_COMMUNITY)
Admission: AD | Admit: 2018-03-19 | Discharge: 2018-03-19 | Disposition: A | Discharge status: Home / Self Care | Payer: Medicare HMO | Source: Skilled Nursing Facility | Attending: Internal Medicine | Admitting: Internal Medicine

## 2018-03-19 LAB — CBC WITH DIFFERENTIAL/PLATELET
BASOS ABS: 0 10*3/uL (ref 0.0–0.1)
Basophils Relative: 0 %
EOS PCT: 3 %
Eosinophils Absolute: 0.2 10*3/uL (ref 0.0–0.7)
HCT: 31.8 % — ABNORMAL LOW (ref 36.0–46.0)
Hemoglobin: 10.6 g/dL — ABNORMAL LOW (ref 12.0–15.0)
LYMPHS PCT: 19 %
Lymphs Abs: 1.4 10*3/uL (ref 0.7–4.0)
MCH: 30.6 pg (ref 26.0–34.0)
MCHC: 33.3 g/dL (ref 30.0–36.0)
MCV: 91.9 fL (ref 78.0–100.0)
MONO ABS: 0.9 10*3/uL (ref 0.1–1.0)
Monocytes Relative: 13 %
Neutro Abs: 4.5 10*3/uL (ref 1.7–7.7)
Neutrophils Relative %: 65 %
PLATELETS: 259 10*3/uL (ref 150–400)
RBC: 3.46 MIL/uL — ABNORMAL LOW (ref 3.87–5.11)
RDW: 13.4 % (ref 11.5–15.5)
WBC: 7 10*3/uL (ref 4.0–10.5)

## 2018-03-19 LAB — BASIC METABOLIC PANEL
Anion gap: 8 (ref 5–15)
BUN: 10 mg/dL (ref 8–23)
CO2: 28 mmol/L (ref 22–32)
CREATININE: 0.5 mg/dL (ref 0.44–1.00)
Calcium: 8.2 mg/dL — ABNORMAL LOW (ref 8.9–10.3)
Chloride: 93 mmol/L — ABNORMAL LOW (ref 98–111)
GFR calc Af Amer: >60 mL/min (ref >60)
GLUCOSE: 113 mg/dL — AB (ref 70–99)
POTASSIUM: 4 mmol/L (ref 3.5–5.1)
Sodium: 129 mmol/L — ABNORMAL LOW (ref 135–145)

## 2018-03-21 ENCOUNTER — Encounter: Payer: Self-pay | Admitting: Internal Medicine

## 2018-03-21 ENCOUNTER — Non-Acute Institutional Stay (SKILLED_NURSING_FACILITY): Payer: Medicare HMO | Admitting: Internal Medicine

## 2018-03-21 DIAGNOSIS — D649 Anemia, unspecified: Secondary | ICD-10-CM

## 2018-03-21 DIAGNOSIS — E785 Hyperlipidemia, unspecified: Secondary | ICD-10-CM | POA: Diagnosis not present

## 2018-03-21 DIAGNOSIS — I10 Essential (primary) hypertension: Secondary | ICD-10-CM

## 2018-03-21 DIAGNOSIS — S72431S Displaced fracture of medial condyle of right femur, sequela: Secondary | ICD-10-CM | POA: Diagnosis not present

## 2018-03-21 DIAGNOSIS — J189 Pneumonia, unspecified organism: Secondary | ICD-10-CM

## 2018-03-21 DIAGNOSIS — E871 Hypo-osmolality and hyponatremia: Secondary | ICD-10-CM

## 2018-03-21 DIAGNOSIS — J449 Chronic obstructive pulmonary disease, unspecified: Secondary | ICD-10-CM | POA: Diagnosis not present

## 2018-03-21 DIAGNOSIS — I5033 Acute on chronic diastolic (congestive) heart failure: Secondary | ICD-10-CM

## 2018-03-21 NOTE — Progress Notes (Signed)
Provider: Veleta Miners MD  Location:    [place] Room Number: 103/P Place of Service:  SNF (31)  PCP: Eustaquio Maize, MD Patient Care Team: Eustaquio Maize, MD as PCP - General (Pediatrics) Deneise Lever, MD (Pulmonary Disease)  Extended Emergency Contact Information Primary Emergency Contact: Loflin,Bobby Address: [address]          [place] Phone: [phone] Relation: Son Secondary Emergency Contact: Theressa, [place] Mobile Phone: [phone] Relation: Daughter  Code Status: Full Code Goals of Care: Advanced Directive information Advanced Directives 03/21/2018  Does Patient Have a Medical Advance Directive? Yes  Type of Advance Directive (No Data)  Does patient want to make changes to medical advance directive? No - Patient declined  Copy of [place] in Chart? -  Would patient like information on creating a medical advance directive? No - Patient declined      Chief Complaint  Patient presents with  . New Admit To SNF    New Admission Visit    HPI: Patient is a 84 y.o. female seen today for admission to SNF for therapy after staying in the hospital from 09/03-09/06 for Right Distal Femoral Fracture requiring ORIF on 09/04. Patient has a history of COPD, hyperlipidemia, hypertension who slipped at home and was found to have distal Right femur fracture.  She underwent ORIF.  Now is in the facility for therapy.  In the facility patient was found to have cough and her chest x-ray done showed left basal infiltrate.  She was started on Augmentin. Patient denies any shortness of breath or chest pain.  She denies any fever or chills.  Her only complaint is pain in that leg.  Per nurses she has been very confused.  Her family was also concerned that she has gained some weight and had swelling in both her legs. Patient lives with her son.  Walks with a cane at home.  Was independent in her  ADL Past Medical History:  Diagnosis Date  . Arthritis    "spine" (03/16/2018)  . CHF (congestive heart failure) ([place])   . Chronic bronchitis ([place])   . Compression fracture of lumbar vertebra ([place])   . Dyslipidemia   . Hypertension   . Multiple allergies   . Osteopenia   . Rhinitis, chronic    Past Surgical History:  Procedure Laterality Date  . CATARACT EXTRACTION W/ INTRAOCULAR LENS  IMPLANT, BILATERAL Bilateral   . CHOLECYSTECTOMY    . FRACTURE SURGERY    . OPEN REDUCTION INTERNAL FIXATION (ORIF) DISTAL RADIAL FRACTURE Right 03/16/2018  . ORIF FEMUR FRACTURE Right 03/16/2018   Procedure: OPEN REDUCTION INTERNAL FIXATION (ORIF) DISTAL FEMUR FRACTURE;  Surgeon: Shona Needles, MD;  Location: [facility];  Service: Orthopedics;  Laterality: Right;  . THYROIDECTOMY, PARTIAL  2004   [place]    reports that she has never smoked. She has never used smokeless tobacco. She reports that she does not drink alcohol or use drugs. Social History   Socioeconomic History  . Marital status: Widowed    Spouse name: Not on file  . Number of children: Not on file  . Years of education: Not on file  . Highest education level: Not on file  Occupational History  . Not on file  Social Needs  . Financial resource strain: Not on file  . Food insecurity:    Worry: Not on file    Inability: Not on file  .  Transportation needs:    Medical: Not on file    Non-medical: Not on file  Tobacco Use  . Smoking status: Never Smoker  . Smokeless tobacco: Never Used  Substance and Sexual Activity  . Alcohol use: No    Alcohol/week: 0.0 standard drinks  . Drug use: No  . Sexual activity: Not on file  Lifestyle  . Physical activity:    Days per week: Not on file    Minutes per session: Not on file  . Stress: Not on file  Relationships  . Social connections:    Talks on phone: Not on file    Gets together: Not on file    Attends religious service: Not on file    Active member of club or organization: Not  on file    Attends meetings of clubs or organizations: Not on file    Relationship status: Not on file  . Intimate partner violence:    Fear of current or ex partner: Not on file    Emotionally abused: Not on file    Physically abused: Not on file    Forced sexual activity: Not on file  Other Topics Concern  . Not on file  Social History Narrative  . Not on file    Functional Status Survey:    Family History  Problem Relation Age of Onset  . Heart disease Brother   . Cancer Daughter        endometial/uterine cancer(nodule in right lung)    Health Maintenance  Topic Date Due  . TETANUS/TDAP  04/22/2025  . INFLUENZA VACCINE  Completed  . DEXA SCAN  Completed  . PNA vac Low Risk Adult  Completed    No Known Allergies  Outpatient Encounter Medications as of 03/21/2018  Medication Sig  . acetaminophen (TYLENOL) 325 MG tablet Take 650 mg by mouth every 6 (six) hours as needed.  Marland Kitchen amoxicillin-clavulanate (AUGMENTIN) 500-125 MG tablet Take 1 tablet by mouth 2 (two) times daily. Take for 7 days from 03/20/2018-03/26/2018  . aspirin 81 MG tablet Take 81 mg by mouth daily.    Marland Kitchen azelastine (ASTELIN) 0.1 % nasal spray 2 sprays in each nostril at bedtime.  . Calcium Carbonate-Vitamin D 600-400 MG-UNIT tablet Take 1 tablet by mouth 2 (two) times daily.  Marland Kitchen enoxaparin (LOVENOX) 40 MG/0.4ML injection Inject 0.4 mLs (40 mg total) into the skin daily.  . fluticasone (FLONASE) 50 MCG/ACT nasal spray 2 SPRAYS IN EACH NOSTRIL QD  . fluticasone furoate-vilanterol (BREO ELLIPTA) 100-25 MCG/INH AEPB INHALE 1 PUFF ONCE DAILY AS DIRECTED  . furosemide (LASIX) 20 MG tablet Take 1 tablet (20 mg total) by mouth every Monday, Wednesday, and Friday.  Marland Kitchen guaiFENesin (MUCINEX) 600 MG 12 hr tablet Take 600 mg by mouth 2 (two) times daily.  Marland Kitchen ipratropium (ATROVENT) 0.02 % nebulizer solution Take 0.5 mg by nebulization every 6 (six) hours as needed for wheezing or shortness of breath.  . losartan (COZAAR) 100 MG  tablet Take 1 tablet (100 mg total) by mouth daily.  . Multiple Vitamins-Minerals (MULTIVITAMIN WITH MINERALS) tablet Take 1 tablet by mouth daily.  . Probiotic Product (RISA-BID PROBIOTIC PO) Take 1 tablet by mouth twice a day form 03/20/2018-03/30/2018  . senna-docusate (SENOKOT-S) 8.6-50 MG tablet Take 1 tablet by mouth at bedtime for 7 days.  Marland Kitchen tiotropium (SPIRIVA HANDIHALER) 18 MCG inhalation capsule INHALE THE CONTENTS OF ONE CAPSULE ONCE DAILY  . [DISCONTINUED] oxyCODONE (OXY IR/ROXICODONE) 5 MG immediate release tablet Take 1 tablet (5 mg total)  by mouth every 6 (six) hours as needed for breakthrough pain ((for MODERATE breakthrough pain)).   No facility-administered encounter medications on file as of 03/21/2018.      Review of Systems  Review of Systems  Constitutional: Negative for activity change, appetite change, chills, diaphoresis, fatigue and fever.  HENT: Negative for mouth sores, postnasal drip, rhinorrhea, sinus pain and sore throat.   Respiratory: Negative for apnea,  chest tightness, shortness of breath and wheezing.   Cardiovascular: Negative for chest pain, palpitations and leg swelling.  Gastrointestinal: Negative for abdominal distention, abdominal pain, constipation, diarrhea, nausea and vomiting.  Genitourinary: Negative for dysuria and frequency.  Musculoskeletal: Negative for arthralgias, joint swelling and myalgias.  Skin: Negative for rash.  Neurological: Negative for dizziness, syncope, weakness, light-headedness and numbness.  Psychiatric/Behavioral: Negative for behavioral problems, confusion and sleep disturbance.     Vitals:   03/21/18 0934  BP: (!) 160/80  Pulse: 77  Resp: 19  Temp: (!) 97.3 F (36.3 C)  TempSrc: Oral  SpO2: 94%   There is no height or weight on file to calculate BMI. Physical Exam  Constitutional: She appears well-developed and well-nourished.  HENT:  Head: Normocephalic.  Mouth/Throat: Oropharynx is clear and moist.  Eyes:  Pupils are equal, round, and reactive to light.  Neck: Neck supple.  Cardiovascular: Normal rate and regular rhythm.  No murmur heard. Pulmonary/Chest: Effort normal and breath sounds normal. No stridor. No respiratory distress. She has no wheezes.  Abdominal: Soft. Bowel sounds are normal. She exhibits no distension. There is no tenderness. There is no guarding.  Musculoskeletal:  Moderate Edema Bilateral  Neurological: She is alert.  Was oriented but will get confused about some details  Was moving all her extremities  Skin: Skin is warm and dry.  Psychiatric: She has a normal mood and affect. Her behavior is normal. Thought content normal.    Labs reviewed: Basic Metabolic Panel: Recent Labs    03/17/18 0835 03/18/18 0509 03/19/18 0340  NA 130* 130* 129*  K 4.2 5.0 4.0  CL 95* 96* 93*  CO2 28 25 28   GLUCOSE 118* 112* 113*  BUN 11 11 10   CREATININE 0.73 0.55 0.50  CALCIUM 7.9* 8.0* 8.2*  MG 2.1  --   --    Liver Function Tests: No results for input(s): AST, ALT, ALKPHOS, BILITOT, PROT, ALBUMIN in the last 8760 hours. No results for input(s): LIPASE, AMYLASE in the last 8760 hours. No results for input(s): AMMONIA in the last 8760 hours. CBC: Recent Labs    03/15/18 0426  03/17/18 0835 03/18/18 0655 03/19/18 0340  WBC 10.2   < > 9.5 8.5 7.0  NEUTROABS 8.5*  --  7.0  --  4.5  HGB 11.8*   < > 9.8* 10.7* 10.6*  HCT 35.1*   < > 29.7* 31.5* 31.8*  MCV 92.4   < > 93.4 91.8 91.9  PLT 226   < > 190 206 259   < > = values in this interval not displayed.   Cardiac Enzymes: No results for input(s): CKTOTAL, CKMB, CKMBINDEX, TROPONINI in the last 8760 hours. BNP: Invalid input(s): POCBNP Lab Results  Component Value Date   HGBA1C 5.8 04/29/2017   Lab Results  Component Value Date   TSH 2.84 10/27/2016   Lab Results  Component Value Date   VITAMINB12 362 12/15/2013   No results found for: FOLATE No results found for: IRON, TIBC, FERRITIN  Imaging and  Procedures obtained prior to SNF admission: No  results found.  Assessment/Plan  S/P ORIF for Distal Right Femure Fracture Request by family oxycodone was discontinued due to confusion We will continue using Tylenol PRN for pain Patient is WBAT Continue on Lovenox for DVT prophylaxis  Essential hypertension Patient's blood pressure mildly elevated today Are increasing Lasix daily will help Continue on losartan  Diastolic dysfunction with acute on chronic heart failure  Patient has gained 5 pounds since her baseline weight at home Will restart her Lasix daily HCAP Check pulse ox daily Vital signs every 4 hours Patient on Augmentin.  Her vitals are stable  Obstructive chronic bronchitis without exacerbation (HCC) Continue on Breo and Flonase  Anemia Post Op Hemoglobin stable Repeat CBC  Hyponatremia  Patient has chronic hyponatremia Since we are increasing the Lasix we will repeat BMP     Family/ staff Communication:   Labs/tests ordered:

## 2018-03-24 ENCOUNTER — Encounter (HOSPITAL_COMMUNITY)
Admission: RE | Admit: 2018-03-24 | Discharge: 2018-03-24 | Disposition: A | Discharge status: Home / Self Care | Payer: Medicare HMO | Source: Skilled Nursing Facility | Attending: Internal Medicine | Admitting: Internal Medicine

## 2018-03-24 DIAGNOSIS — Z4789 Encounter for other orthopedic aftercare: Secondary | ICD-10-CM | POA: Insufficient documentation

## 2018-03-24 DIAGNOSIS — D649 Anemia, unspecified: Secondary | ICD-10-CM | POA: Insufficient documentation

## 2018-03-24 DIAGNOSIS — S72401D Unspecified fracture of lower end of right femur, subsequent encounter for closed fracture with routine healing: Secondary | ICD-10-CM | POA: Insufficient documentation

## 2018-03-24 DIAGNOSIS — X58XXXD Exposure to other specified factors, subsequent encounter: Secondary | ICD-10-CM | POA: Insufficient documentation

## 2018-03-24 LAB — CBC WITH DIFFERENTIAL/PLATELET
BASOS ABS: 0 10*3/uL (ref 0.0–0.1)
BASOS PCT: 1 %
EOS ABS: 0.2 10*3/uL (ref 0.0–0.7)
EOS PCT: 4 %
HEMATOCRIT: 32.3 % — AB (ref 36.0–46.0)
Hemoglobin: 10.8 g/dL — ABNORMAL LOW (ref 12.0–15.0)
Lymphocytes Relative: 26 %
Lymphs Abs: 1.3 10*3/uL (ref 0.7–4.0)
MCH: 30.9 pg (ref 26.0–34.0)
MCHC: 33.4 g/dL (ref 30.0–36.0)
MCV: 92.3 fL (ref 78.0–100.0)
MONO ABS: 0.8 10*3/uL (ref 0.1–1.0)
Monocytes Relative: 17 %
Neutro Abs: 2.6 10*3/uL (ref 1.7–7.7)
Neutrophils Relative %: 52 %
PLATELETS: 348 10*3/uL (ref 150–400)
RBC: 3.5 MIL/uL — ABNORMAL LOW (ref 3.87–5.11)
RDW: 13.7 % (ref 11.5–15.5)
WBC: 5 10*3/uL (ref 4.0–10.5)

## 2018-03-24 LAB — BASIC METABOLIC PANEL
ANION GAP: 7 (ref 5–15)
BUN: 12 mg/dL (ref 8–23)
CO2: 27 mmol/L (ref 22–32)
Calcium: 8.4 mg/dL — ABNORMAL LOW (ref 8.9–10.3)
Chloride: 96 mmol/L — ABNORMAL LOW (ref 98–111)
Creatinine, Ser: 0.58 mg/dL (ref 0.44–1.00)
Glucose, Bld: 99 mg/dL (ref 70–99)
Potassium: 4 mmol/L (ref 3.5–5.1)
SODIUM: 130 mmol/L — AB (ref 135–145)

## 2018-03-29 ENCOUNTER — Encounter: Payer: Self-pay | Admitting: Internal Medicine

## 2018-03-29 ENCOUNTER — Non-Acute Institutional Stay (SKILLED_NURSING_FACILITY): Payer: Medicare HMO | Admitting: Internal Medicine

## 2018-03-29 DIAGNOSIS — E871 Hypo-osmolality and hyponatremia: Secondary | ICD-10-CM

## 2018-03-29 DIAGNOSIS — I5033 Acute on chronic diastolic (congestive) heart failure: Secondary | ICD-10-CM | POA: Diagnosis not present

## 2018-03-29 DIAGNOSIS — J449 Chronic obstructive pulmonary disease, unspecified: Secondary | ICD-10-CM

## 2018-03-29 DIAGNOSIS — S72431S Displaced fracture of medial condyle of right femur, sequela: Secondary | ICD-10-CM | POA: Diagnosis not present

## 2018-03-29 NOTE — Progress Notes (Signed)
Location:    [facility] Room Number: 103/P Place of Service:  SNF [phone]) Provider:  Hedwig Morton, MD  Patient Care Team: Eustaquio Maize, MD as PCP - General (Pediatrics) Deneise Lever, MD (Pulmonary Disease)  Extended Emergency Contact Information Primary Emergency Contact: Goodhart,Bobby Address: [place] [address]          [place] Litter of [place] Phone: [phone] Relation: Son Secondary Emergency Contact: Catharine, [place] Mobile Phone: [phone] Relation: Daughter  Code Status:  Full Code Goals of care: Advanced Directive information Advanced Directives 03/29/2018  Does Patient Have a Medical Advance Directive? Yes  Type of Advance Directive (No Data)  Does patient want to make changes to medical advance directive? No - Patient declined  Copy of [place] in Chart? -  Would patient like information on creating a medical advance directive? No - Patient declined     Chief Complaint  Patient presents with  . Acute Visit    F/U CHF  And COPD  HPI:  Pt is a 84 y.o. female seen today for an acute visit for follow-up of diastolic CHF as well as COPD  She is here for therapy after sustaining a right distal femoral fracture after a fall which was surgically repaired.  She also has a history of COPD as well as hyperlipidemia hypertension diastolic CHF.  She had gained a small amount of weight initially after being admitted here her Lasix was restarted low-dose 20 mg a day-her weight appears to have moderated currently 194 pounds which is down about 4-- 5 pounds from initial weight.  She also was treated initially after admission for left lower lobe infiltrate on Augmentin which she has completed-he is not complaining of any increased shortness of breath today nursing staff says she has been stable she is on DuoNeb's every 6 hours as needed and continues on Flonase Spiriva and Breo Ellipta  Currently  she is sitting in her wheelchair comfortably- she has worked with physical therapy.  She is not really complaining of pain.  She does have a history of chronic hyponatremia which appears stable with a sodium of 130 on lab done on September 12 update lab is pending for later this week.      Past Medical History:  Diagnosis Date  . Arthritis    "spine" (03/16/2018)  . CHF (congestive heart failure) ([place])   . Chronic bronchitis ([place])   . Compression fracture of lumbar vertebra ([place])   . Dyslipidemia   . Hypertension   . Multiple allergies   . Osteopenia   . Rhinitis, chronic    Past Surgical History:  Procedure Laterality Date  . CATARACT EXTRACTION W/ INTRAOCULAR LENS  IMPLANT, BILATERAL Bilateral   . CHOLECYSTECTOMY    . FRACTURE SURGERY    . OPEN REDUCTION INTERNAL FIXATION (ORIF) DISTAL RADIAL FRACTURE Right 03/16/2018  . ORIF FEMUR FRACTURE Right 03/16/2018   Procedure: OPEN REDUCTION INTERNAL FIXATION (ORIF) DISTAL FEMUR FRACTURE;  Surgeon: Shona Needles, MD;  Location: [facility];  Service: Orthopedics;  Laterality: Right;  . THYROIDECTOMY, PARTIAL  2004   [facility]    No Known Allergies  Outpatient Encounter Medications as of 03/29/2018  Medication Sig  . acetaminophen (TYLENOL) 325 MG tablet Take 650 mg by mouth every 6 (six) hours as needed.  Marland Kitchen aspirin 81 MG tablet Take 81 mg by mouth daily.    Marland Kitchen azelastine (ASTELIN) 0.1 % nasal spray 2 sprays in  each nostril at bedtime.  . Calcium Carbonate-Vitamin D 600-400 MG-UNIT tablet Take 1 tablet by mouth 2 (two) times daily.  Marland Kitchen enoxaparin (LOVENOX) 40 MG/0.4ML injection Inject 0.4 mLs (40 mg total) into the skin daily.  . fluticasone (FLONASE) 50 MCG/ACT nasal spray 2 SPRAYS IN EACH NOSTRIL QD  . fluticasone furoate-vilanterol (BREO ELLIPTA) 100-25 MCG/INH AEPB INHALE 1 PUFF ONCE DAILY AS DIRECTED  . furosemide (LASIX) 20 MG tablet Take 1 tablet (20 mg total) by mouth every Monday, Wednesday, and Friday.  Marland Kitchen ipratropium (ATROVENT)  0.02 % nebulizer solution Take 0.5 mg by nebulization every 6 (six) hours as needed for wheezing or shortness of breath.  . losartan (COZAAR) 100 MG tablet Take 1 tablet (100 mg total) by mouth daily.  . Multiple Vitamins-Minerals (MULTIVITAMIN WITH MINERALS) tablet Take 1 tablet by mouth daily.  . Probiotic Product (RISA-BID PROBIOTIC PO) Take 1 tablet by mouth twice a day form 03/20/2018-03/30/2018  . tiotropium (SPIRIVA HANDIHALER) 18 MCG inhalation capsule INHALE THE CONTENTS OF ONE CAPSULE ONCE DAILY  . [DISCONTINUED] amoxicillin-clavulanate (AUGMENTIN) 500-125 MG tablet Take 1 tablet by mouth 2 (two) times daily. Take for 7 days from 03/20/2018-03/26/2018  . [DISCONTINUED] guaiFENesin (MUCINEX) 600 MG 12 hr tablet Take 600 mg by mouth 2 (two) times daily.   No facility-administered encounter medications on file as of 03/29/2018.     Review of Systems  This is limited secondary patient being a poor historian provided by nursing as well.  In general is not complaining of fever chills.  Skin is not complain of rashes or itching.  Head ears eyes nose mouth and throat does not complain of a headache or sore throat or visual changes.  Respiratory has a history of COPD but is not complaining of shortness of breath or increased cough from baseline.  Cardiac is not complaining of chest pain -lower extremity edema is quite mild  GI is not complaining of abdominal pain nausea vomiting diarrhea constipation.  Musculoskeletal is not really complaining of joint pain at this time.  Neurologic is not complaining of headache dizziness syncope.  In psych does appear to have some cognitive deficits but does not appear to be overtly anxious or depressed  Immunization History  Administered Date(s) Administered  . Influenza, High Dose Seasonal PF 04/23/2016, 04/29/2017, 03/17/2018  . Influenza,inj,Quad PF,6+ Mos 04/26/2013, 04/19/2014, 04/23/2015  . Pneumococcal Conjugate-13 05/25/2014  . Pneumococcal  Polysaccharide-23 03/14/1999, 04/23/2015  . Tdap 04/23/2015  . Zoster 03/25/2011   Pertinent  Health Maintenance Due  Topic Date Due  . INFLUENZA VACCINE  Completed  . DEXA SCAN  Completed  . PNA vac Low Risk Adult  Completed   Fall Risk  02/07/2018 11/08/2017 09/10/2017 04/29/2017 02/18/2017  Falls in the past year? No No Yes Yes No  Number falls in past yr: - - 2 or more 1 -  Injury with Fall? - - No No -  Risk Factor Category  - - High Fall Risk - -  Risk for fall due to : - - History of fall(s);Impaired balance/gait - -  Follow up - - - Falls prevention discussed;Education provided -  Comment - - - - -   Functional Status Survey:    Temperature is 97.6 pulse 68 respirations 19 blood pressure 128/60 taken manually O2 saturation is 94% on room air weight is 194 pounds  Physical Exam   In general this is a pleasant elderly female in no distress she appears well-nourished.  Her skin is warm and dry  surgical site right hip could not be assessed secondary to patient positioning--Per nursing area is currently covered without sign of infection.  Eyes sclera and conjunctive are clear visual acuity appears grossly intact.  Oropharynx is clear mucous membranes moist.  Chest she has some  mild expiratory rhonchi but this clears largely with cough there is no labored breathing  Abdomen is obese soft nontender with positive bowel sounds.  Musculoskeletal Limited exam since she is in a wheelchair but it appears able to move all extremities with limitations of her right lower extremity status post surgery-she has quite mild lower extremity edema pedal pulses are intact bilaterally.  Neurologic appears grossly intact her speech is clear cranial nerves appear to be intact.  Psych she is grossly alert and oriented she does have some mild cognitive deficits and gets confused at times.   Labs reviewed: Recent Labs    03/17/18 0835 03/18/18 0509 03/19/18 0340 03/24/18 0700  NA 130* 130*  129* 130*  K 4.2 5.0 4.0 4.0  CL 95* 96* 93* 96*  CO2 28 25 28 27   GLUCOSE 118* 112* 113* 99  BUN 11 11 10 12   CREATININE 0.73 0.55 0.50 0.58  CALCIUM 7.9* 8.0* 8.2* 8.4*  MG 2.1  --   --   --    No results for input(s): AST, ALT, ALKPHOS, BILITOT, PROT, ALBUMIN in the last 8760 hours. Recent Labs    03/17/18 0835 03/18/18 0655 03/19/18 0340 03/24/18 0700  WBC 9.5 8.5 7.0 5.0  NEUTROABS 7.0  --  4.5 2.6  HGB 9.8* 10.7* 10.6* 10.8*  HCT 29.7* 31.5* 31.8* 32.3*  MCV 93.4 91.8 91.9 92.3  PLT 190 206 259 348   Lab Results  Component Value Date   TSH 2.84 10/27/2016   Lab Results  Component Value Date   HGBA1C 5.8 04/29/2017   Lab Results  Component Value Date   CHOL 184 10/27/2016   HDL 56.20 10/27/2016   LDLCALC 105 (H) 10/27/2016   TRIG 112.0 10/27/2016   CHOLHDL 3 10/27/2016    Significant Diagnostic Results in last 30 days:  Dg Chest Port 1 View  Result Date: 03/17/2018 CLINICAL DATA:  Cough today, some shortness of breath, recent leg surgery, history CHF, hypertension EXAM: PORTABLE CHEST 1 VIEW COMPARISON:  Portable exam 0914 hours compared to 03/15/2017 FINDINGS: Enlargement of cardiac silhouette with slight pulmonary vascular congestion. Atherosclerotic calcification aorta. Mediastinal contours stable. Interstitial infiltrates slightly increased since previous exam question minimal pulmonary edema. Bibasilar atelectasis. No segmental consolidation or pneumothorax. Bones demineralized. IMPRESSION: Enlargement of cardiac silhouette with bibasilar atelectasis and slightly increased perihilar infiltrates question minimal pulmonary edema. Electronically Signed   By: Lavonia Dana M.D.   On: 03/17/2018 11:30   Chest Portable 1 View  Result Date: 03/15/2018 CLINICAL DATA:  Preoperative evaluation, fall this morning, hypertension, COPD EXAM: PORTABLE CHEST 1 VIEW COMPARISON:  Portable exam 0650 hours compared to 02/06/2016 FINDINGS: Enlargement of cardiac silhouette with  pulmonary vascular congestion. Atherosclerotic calcification aorta. Bronchitic changes with LEFT basilar opacity question atelectasis versus infiltrate. Minimal RIGHT basilar atelectasis. Upper lungs clear. No gross pleural effusion or pneumothorax. Bones demineralized. IMPRESSION: Enlargement of cardiac silhouette with mild RIGHT basilar atelectasis and either atelectasis or consolidation at LEFT base. Electronically Signed   By: Lavonia Dana M.D.   On: 03/15/2018 07:09   Dg Knee Complete 4 Views Left  Result Date: 03/15/2018 CLINICAL DATA:  Left knee pain after fall EXAM: LEFT KNEE - COMPLETE 4+ VIEW COMPARISON:  None.  FINDINGS: No acute fracture nor joint dislocation. Subchondral areas of osteopenia involving the weight-bearing portion of the medial femoral condyle may reflect chondral fissuring and/or thinning. Degenerative spurring is noted of the tibial spines. No joint effusion. IMPRESSION: Mild femorotibial joint osteoarthritis.  No acute fracture. Electronically Signed   By: [name] Royalty M.D.   On: 03/15/2018 03:43   Dg Knee Complete 4 Views Right  Result Date: 03/15/2018 CLINICAL DATA:  Patient fell today and has right knee pain. EXAM: RIGHT KNEE - COMPLETE 4+ VIEW COMPARISON:  None. FINDINGS: Acute oblique displaced closed fracture of the distal right femoral diaphysis extending into the metaphysis. There is of the 16 mm of medial displacement of the femoral condyles and distal fracture fragment relative to the femoral diaphysis. No intra-articular extension is identified. Osteoarthritic joint space narrowing of the femorotibial compartment is seen. The patellofemoral appears intact. No joint effusion. IMPRESSION: Acute, closed, oblique fracture of the distal femoral diaphysis exiting out the lateral femoral metaphysis and with resultant 16 mm of medial displacement of the femoral condyles and distal fracture fragment. Electronically Signed   By: [name] Royalty M.D.   On: 03/15/2018 03:41   Dg C-arm  1-60 Min  Result Date: 03/16/2018 CLINICAL DATA:  ORIF distal RIGHT femoral fracture EXAM: RIGHT FEMUR 2 VIEWS; RIGHT FEMUR PORTABLE 2 VIEW; DG C-ARM 61-120 MIN COMPARISON:  Preoperative evaluation 03/15/2018 FLUOROSCOPY TIME:  2 minutes 12 seconds Images obtained: 8 intraoperatively, 3 postoperatively FINDINGS: RIGHT femur 8 views: Digital C-arm fluoroscopic images obtained intraoperatively demonstrate sequential reduction of previously identified distal RIGHT femoral oblique metadiaphyseal fracture and placement of 2 cannulated screws. Additionally, a lateral plate and multiple screws was placed extending from the proximal to mid RIGHT femoral diaphysis to the lateral femoral condyle. Knee joint alignment normal. RIGHT knee two views: Images obtained postoperatively portably in recovery demonstrate lateral plate and multiple screws across a reduced oblique fracture of the distal RIGHT femoral diaphysis. An additional nondisplaced fracture plane seen extending into the mid femoral diaphysis. Knee joint alignment normal no degenerative changes and knee joint are present. Hip joint space is preserved. There is no evidence of fracture or other focal bone lesions. Soft tissues are unremarkable. IMPRESSION: Intraoperative and postoperative images of the RIGHT femur demonstrating ORIF for previously identified distal RIGHT femur fracture. Note is made of an additional oblique fracture plane extending into the mid to distal RIGHT femoral diaphysis, better visualized than on preoperative exam. Electronically Signed   By: Lavonia Dana M.D.   On: 03/16/2018 13:42   Dg Hip Unilat W Or Wo Pelvis 2-3 Views Right  Result Date: 03/15/2018 CLINICAL DATA:  Right hip pain after fall today. EXAM: DG HIP (WITH OR WITHOUT PELVIS) 2-3V RIGHT COMPARISON:  None. FINDINGS: AP view the pelvis demonstrates degenerative disc disease of the included lower lumbar spine with spurring off the endplates from L2 through S1. Osteoarthritis of the  SI joints and pubic symphysis without diastasis. No acute pelvic fracture. Left greater than right joint space narrowing of the hips without fracture or joint dislocation. Femoral heads are spherical in appearance without flattening. IMPRESSION: Lower lumbar degenerative disc facet arthropathy. No acute pelvic or hip fracture. Osteoarthritis of the SI joints, pubic symphysis and left greater than right hip joint space narrowing. Electronically Signed   By: [name] Royalty M.D.   On: 03/15/2018 03:45   Dg Femur, Min 2 Views Right  Result Date: 03/16/2018 CLINICAL DATA:  ORIF distal RIGHT femoral fracture EXAM: RIGHT FEMUR 2 VIEWS;  RIGHT FEMUR PORTABLE 2 VIEW; DG C-ARM 61-120 MIN COMPARISON:  Preoperative evaluation 03/15/2018 FLUOROSCOPY TIME:  2 minutes 12 seconds Images obtained: 8 intraoperatively, 3 postoperatively FINDINGS: RIGHT femur 8 views: Digital C-arm fluoroscopic images obtained intraoperatively demonstrate sequential reduction of previously identified distal RIGHT femoral oblique metadiaphyseal fracture and placement of 2 cannulated screws. Additionally, a lateral plate and multiple screws was placed extending from the proximal to mid RIGHT femoral diaphysis to the lateral femoral condyle. Knee joint alignment normal. RIGHT knee two views: Images obtained postoperatively portably in recovery demonstrate lateral plate and multiple screws across a reduced oblique fracture of the distal RIGHT femoral diaphysis. An additional nondisplaced fracture plane seen extending into the mid femoral diaphysis. Knee joint alignment normal no degenerative changes and knee joint are present. Hip joint space is preserved. There is no evidence of fracture or other focal bone lesions. Soft tissues are unremarkable. IMPRESSION: Intraoperative and postoperative images of the RIGHT femur demonstrating ORIF for previously identified distal RIGHT femur fracture. Note is made of an additional oblique fracture plane extending into  the mid to distal RIGHT femoral diaphysis, better visualized than on preoperative exam. Electronically Signed   By: Lavonia Dana M.D.   On: 03/16/2018 13:42   Dg Femur Min 2 Views Right  Result Date: 03/15/2018 CLINICAL DATA:  Pain after fall. EXAM: RIGHT FEMUR 2 VIEWS COMPARISON:  None. FINDINGS: Acute, closed, oblique fracture of the right distal femoral diaphysis exiting the lateral femoral metaphysis. Resultant medial displacement of the distal fracture fragment which includes both femoral condyles at least 1/2 shaft width. Osteoarthritis of the femorotibial compartment with spurring. No intra-articular extension of fracture. No joint effusion. IMPRESSION: Acute oblique distal femoral diaphyseal fracture exiting the lateral femoral metaphysis with resultant medial displacement of the distal fracture fragment by 1/2 shaft width. Osteoarthritic joint space narrowing of the femorotibial compartment. Electronically Signed   By: [name] Royalty M.D.   On: 03/15/2018 03:47   Dg Femur Port, Min 2 Views Right  Result Date: 03/16/2018 CLINICAL DATA:  ORIF distal RIGHT femoral fracture EXAM: RIGHT FEMUR 2 VIEWS; RIGHT FEMUR PORTABLE 2 VIEW; DG C-ARM 61-120 MIN COMPARISON:  Preoperative evaluation 03/15/2018 FLUOROSCOPY TIME:  2 minutes 12 seconds Images obtained: 8 intraoperatively, 3 postoperatively FINDINGS: RIGHT femur 8 views: Digital C-arm fluoroscopic images obtained intraoperatively demonstrate sequential reduction of previously identified distal RIGHT femoral oblique metadiaphyseal fracture and placement of 2 cannulated screws. Additionally, a lateral plate and multiple screws was placed extending from the proximal to mid RIGHT femoral diaphysis to the lateral femoral condyle. Knee joint alignment normal. RIGHT knee two views: Images obtained postoperatively portably in recovery demonstrate lateral plate and multiple screws across a reduced oblique fracture of the distal RIGHT femoral diaphysis. An additional  nondisplaced fracture plane seen extending into the mid femoral diaphysis. Knee joint alignment normal no degenerative changes and knee joint are present. Hip joint space is preserved. There is no evidence of fracture or other focal bone lesions. Soft tissues are unremarkable. IMPRESSION: Intraoperative and postoperative images of the RIGHT femur demonstrating ORIF for previously identified distal RIGHT femur fracture. Note is made of an additional oblique fracture plane extending into the mid to distal RIGHT femoral diaphysis, better visualized than on preoperative exam. Electronically Signed   By: Lavonia Dana M.D.   On: 03/16/2018 13:42    Assessment/Plan  #1- history of COPD-with left lung infiltrate- she has completed a course of Augmentin this appears to have stabilized-she continues on duo nebs as needed-she  also is on Breo-- Ellipta-Flonase-and Spiriva Oxygen saturations are stable she has some coughing but this is more so in the morning per nursing and I suspect is relatively baseline--continue to monitor.  2.  History of diastolic CHF again her weight is slowly trending down she has been started low-dose Lasix- her sodium which is chronically somewhat low appears to be stable at 130 but this will be updated later this week-.  3.  History of right femur fracture status post repair-she continues on Lovenox for DVT prophylaxis she is on Tylenol for pain apparently oxycodone causes increased confusion- she is participating somewhat therapy-at this point will monitor.  4.  Postop anemia-most recent hemoglobin appears relatively stable at 10.8 will update that later this week as well.  5.  History of chronic hyponatremia as noted above will update a sodium level appears stable on most recent lab at 130.  6.  Hypothyroidism she is on Synthroid will update a TSH for updated values  [id number]

## 2018-03-31 ENCOUNTER — Encounter (HOSPITAL_COMMUNITY)
Admission: RE | Admit: 2018-03-31 | Discharge: 2018-03-31 | Disposition: A | Discharge status: Home / Self Care | Payer: Medicare HMO | Source: Skilled Nursing Facility | Attending: Internal Medicine | Admitting: Internal Medicine

## 2018-03-31 DIAGNOSIS — I5032 Chronic diastolic (congestive) heart failure: Secondary | ICD-10-CM | POA: Insufficient documentation

## 2018-03-31 LAB — BASIC METABOLIC PANEL
Anion gap: 5 (ref 5–15)
BUN: 10 mg/dL (ref 8–23)
CHLORIDE: 100 mmol/L (ref 98–111)
CO2: 30 mmol/L (ref 22–32)
Calcium: 8.3 mg/dL — ABNORMAL LOW (ref 8.9–10.3)
Creatinine, Ser: 0.56 mg/dL (ref 0.44–1.00)
GFR calc non Af Amer: >60 mL/min (ref >60)
Glucose, Bld: 100 mg/dL — ABNORMAL HIGH (ref 70–99)
POTASSIUM: 3.4 mmol/L — AB (ref 3.5–5.1)
SODIUM: 135 mmol/L (ref 135–145)

## 2018-04-07 ENCOUNTER — Encounter (HOSPITAL_COMMUNITY)
Admission: RE | Admit: 2018-04-07 | Discharge: 2018-04-07 | Disposition: A | Discharge status: Home / Self Care | Payer: Medicare HMO | Source: Skilled Nursing Facility | Attending: Internal Medicine | Admitting: Internal Medicine

## 2018-04-07 ENCOUNTER — Encounter: Payer: Self-pay | Admitting: Internal Medicine

## 2018-04-07 ENCOUNTER — Non-Acute Institutional Stay (SKILLED_NURSING_FACILITY): Payer: Medicare HMO | Admitting: Internal Medicine

## 2018-04-07 DIAGNOSIS — S72431S Displaced fracture of medial condyle of right femur, sequela: Secondary | ICD-10-CM | POA: Diagnosis not present

## 2018-04-07 DIAGNOSIS — Z4789 Encounter for other orthopedic aftercare: Secondary | ICD-10-CM | POA: Insufficient documentation

## 2018-04-07 DIAGNOSIS — E871 Hypo-osmolality and hyponatremia: Secondary | ICD-10-CM

## 2018-04-07 DIAGNOSIS — I5033 Acute on chronic diastolic (congestive) heart failure: Secondary | ICD-10-CM | POA: Diagnosis not present

## 2018-04-07 DIAGNOSIS — D649 Anemia, unspecified: Secondary | ICD-10-CM | POA: Diagnosis not present

## 2018-04-07 DIAGNOSIS — I10 Essential (primary) hypertension: Secondary | ICD-10-CM

## 2018-04-07 DIAGNOSIS — J449 Chronic obstructive pulmonary disease, unspecified: Secondary | ICD-10-CM | POA: Diagnosis not present

## 2018-04-07 DIAGNOSIS — S72401D Unspecified fracture of lower end of right femur, subsequent encounter for closed fracture with routine healing: Secondary | ICD-10-CM | POA: Insufficient documentation

## 2018-04-07 LAB — BASIC METABOLIC PANEL
Anion gap: 8 (ref 5–15)
BUN: 13 mg/dL (ref 8–23)
CHLORIDE: 95 mmol/L — AB (ref 98–111)
CO2: 28 mmol/L (ref 22–32)
Calcium: 8.1 mg/dL — ABNORMAL LOW (ref 8.9–10.3)
Creatinine, Ser: 0.66 mg/dL (ref 0.44–1.00)
Glucose, Bld: 100 mg/dL — ABNORMAL HIGH (ref 70–99)
POTASSIUM: 3.6 mmol/L (ref 3.5–5.1)
SODIUM: 131 mmol/L — AB (ref 135–145)

## 2018-04-07 NOTE — Progress Notes (Signed)
Location:    [facility] Room Number: 103/P Place of Service:  SNF [phone])  Provider: Granville Lewis PA-C  PCP: Eustaquio Maize, MD Patient Care Team: Eustaquio Maize, MD as PCP - General (Pediatrics) Deneise Lever, MD (Pulmonary Disease)  Extended Emergency Contact Information Primary Emergency Contact: Gillean,Bobby Address: [place]          [place] of [place] Phone: [phone] Relation: Son Secondary Emergency Contact: Arlinda, [place] Mobile Phone: [phone] Relation: Daughter  Code Status: Full Code Goals of care:  Advanced Directive information Advanced Directives 04/07/2018  Does Patient Have a Medical Advance Directive? Yes  Type of Advance Directive (No Data)  Does patient want to make changes to medical advance directive? No - Patient declined  Copy of [place] in Chart? -  Would patient like information on creating a medical advance directive? No - Patient declined     No Known Allergies  Chief Complaint  Patient presents with  . Discharge Note    Discharge Visit    HPI:  84 y.o. female seen today for discharge from facility later this week. She came here for therapy after sustaining a right distal femoral fracture after a fall this was surgically repaired.  She also has a history of COPD as well as hyperlipidemia hypertension diastolic CHF postop anemia.  Per nursing she appears to be doing well- she is not really complaining of hip pain she is not on any narcotics secondary to concerns that she does not tolerate these well and has increased confusion.  She is receiving Tylenol and this appears to be effective.  She will have follow-up with orthopedics on Monday.  She is on Lovenox for anticoagulation DVT prophylaxis.  She also was treated after admission here for left lower lobe infiltrate with Augmentin and this appears to have resolved she does have a history of COPD continues  on Flonase Spiriva and Breo  Ellipta---  She does have a history of diastolic CHF and had gained a small amount of weight after her initial time here her Lasix was restarted low-dose 20 mg a day and weight appears to have moderated currently 190.8 pounds which is actually down about 10 pounds from her initial weight.  She does have a history of chronic hyponatremia but this appears stable with a sodium of 131 on today's lab.  She also had postop anemia which appears to have stabilized at 10.8.  In regards to hypertension she continues on losartan and Lasix this appears stable blood pressure today was 129/83 previous blood pressures 132/73-the highest recent one I see is 149/85 but this appears to be more somewhat abnormal.  Currently she is sitting in her chair comfortably she does have some dementia and is a poor historian but per nursing she is doing well with supportive care and she has no complaints today.       Past Medical History:  Diagnosis Date  . Arthritis    "spine" (03/16/2018)  . CHF (congestive heart failure) ([place])   . Chronic bronchitis ([place])   . Compression fracture of lumbar vertebra ([place])   . Dyslipidemia   . Hypertension   . Multiple allergies   . Osteopenia   . Rhinitis, chronic     Past Surgical History:  Procedure Laterality Date  . CATARACT EXTRACTION W/ INTRAOCULAR LENS  IMPLANT, BILATERAL Bilateral   . CHOLECYSTECTOMY    . FRACTURE SURGERY    . OPEN REDUCTION  INTERNAL FIXATION (ORIF) DISTAL RADIAL FRACTURE Right 03/16/2018  . ORIF FEMUR FRACTURE Right 03/16/2018   Procedure: OPEN REDUCTION INTERNAL FIXATION (ORIF) DISTAL FEMUR FRACTURE;  Surgeon: Shona Needles, MD;  Location: [facility];  Service: Orthopedics;  Laterality: Right;  . THYROIDECTOMY, PARTIAL  2004   [place]      reports that she has never smoked. She has never used smokeless tobacco. She reports that she does not drink alcohol or use drugs. Social History   Socioeconomic History  . Marital  status: Widowed    Spouse name: Not on file  . Number of children: Not on file  . Years of education: Not on file  . Highest education level: Not on file  Occupational History  . Not on file  Social Needs  . Financial resource strain: Not on file  . Food insecurity:    Worry: Not on file    Inability: Not on file  . Transportation needs:    Medical: Not on file    Non-medical: Not on file  Tobacco Use  . Smoking status: Never Smoker  . Smokeless tobacco: Never Used  Substance and Sexual Activity  . Alcohol use: No    Alcohol/week: 0.0 standard drinks  . Drug use: No  . Sexual activity: Not on file  Lifestyle  . Physical activity:    Days per week: Not on file    Minutes per session: Not on file  . Stress: Not on file  Relationships  . Social connections:    Talks on phone: Not on file    Gets together: Not on file    Attends religious service: Not on file    Active member of club or organization: Not on file    Attends meetings of clubs or organizations: Not on file    Relationship status: Not on file  . Intimate partner violence:    Fear of current or ex partner: Not on file    Emotionally abused: Not on file    Physically abused: Not on file    Forced sexual activity: Not on file  Other Topics Concern  . Not on file  Social History Narrative  . Not on file   Functional Status Survey:    No Known Allergies  Pertinent  Health Maintenance Due  Topic Date Due  . INFLUENZA VACCINE  Completed  . DEXA SCAN  Completed  . PNA vac Low Risk Adult  Completed    Medications: Outpatient Encounter Medications as of 04/07/2018  Medication Sig  . acetaminophen (TYLENOL) 325 MG tablet Take 650 mg by mouth every 6 (six) hours as needed.  Marland Kitchen aspirin 81 MG tablet Take 81 mg by mouth daily.    Marland Kitchen azelastine (ASTELIN) 0.1 % nasal spray 2 sprays in each nostril at bedtime.  . Calcium Carbonate-Vitamin D 600-400 MG-UNIT tablet Take 1 tablet by mouth 2 (two) times daily.  Marland Kitchen  enoxaparin (LOVENOX) 40 MG/0.4ML injection Inject 0.4 mLs (40 mg total) into the skin daily.  . fluticasone (FLONASE) 50 MCG/ACT nasal spray 2 SPRAYS IN EACH NOSTRIL QD  . fluticasone furoate-vilanterol (BREO ELLIPTA) 100-25 MCG/INH AEPB INHALE 1 PUFF ONCE DAILY AS DIRECTED  . furosemide (LASIX) 20 MG tablet Take 1 tablet (20 mg total) by mouth every Monday, Wednesday, and Friday.  . losartan (COZAAR) 100 MG tablet Take 1 tablet (100 mg total) by mouth daily.  . Multiple Vitamins-Minerals (MULTIVITAMIN WITH MINERALS) tablet Take 1 tablet by mouth daily.  . potassium chloride SA (K-DUR,KLOR-CON) 20  MEQ tablet Take 20 mEq by mouth daily.  Marland Kitchen tiotropium (SPIRIVA HANDIHALER) 18 MCG inhalation capsule INHALE THE CONTENTS OF ONE CAPSULE ONCE DAILY  . [DISCONTINUED] ipratropium (ATROVENT) 0.02 % nebulizer solution Take 0.5 mg by nebulization every 6 (six) hours as needed for wheezing or shortness of breath.  . [DISCONTINUED] Probiotic Product (RISA-BID PROBIOTIC PO) Take 1 tablet by mouth twice a day form 03/20/2018-03/30/2018   No facility-administered encounter medications on file as of 04/07/2018.      Review of Systems   I this is limited somewhat secondary to patient being a poor historian provided by nursing as well.  General no complaints of fever chills weight appears to have stabilized.  Skin is not complain of rashes or itching surgical site is benign-appearing per nursing with no sign of infection.  Head ears eyes nose mouth and throat she has prescription lenses is not complain of visual changes has not complained of a sore throat.  Respiratory is not complaining of any shortness of breath or cough she does have a history of COPD recently treated for pneumonia as well.  Cardiac is not complaining of any chest pain has baseline lower extremity edema.  GI does not complain of abdominal discomfort nausea vomiting diarrhea constipation appears to have a fairly decent appetite she is eating  her lunch.  GU does not complain of dysuria.  Musculoskeletal does not complain of joint pain at this time per nursing she does not really complain of this-she does have Tylenol as needed for pain.  Neurologic she does not complain of feeling dizzy or having headaches or syncopal.   psych she does have some cognitive deficits but appears to do well with supportive care nursing does not report any behaviors  Vitals:   04/07/18 1431  BP: 129/83  Pulse: 76  Resp: 19  Temp: 98 F (36.7 C)  TempSrc: Oral   Weight is 190.8 pounds  Physical Exam   In general this is a pleasant elderly female in no distress she appears well-nourished and in good spirits.  Her skin is warm and dry surgical site right hip per nursing is covered stitches are in place with no sign of infection.  Eyes she has prescription lenses sclera and conjunctive are clear visual acuity appears grossly intact.  Oropharynx is clear mucous membranes moist.  Chest is clear to auscultation with somewhat shallow air entry she has some slight bronchial sounds but no overt congestion there is no labored breathing.  Heart is regular rate and rhythm without murmur gallop or rub.  She has baseline lower extremity edema.  Abdomen is obese soft nontender positive bowel sounds.  Musculoskeletal continues to be in a wheelchair so limited exam but is able to move all extremities with limitations of her right lower extremity because of recent surgery and has baseline lower extremity weakness upper extremity strength appears preserved.  Neurologic is grossly intact she is alert no lateralizing findings speech is clear.  Psych she is oriented to self does follow simple verbal commands she is pleasant and appropriate.      Labs reviewed: Basic Metabolic Panel: Recent Labs    03/17/18 0835  03/24/18 0700 03/31/18 0700 04/07/18 0700  NA 130*   < > 130* 135 131*  K 4.2   < > 4.0 3.4* 3.6  CL 95*   < > 96* 100 95*  CO2  28   < > 27 30 28   GLUCOSE 118*   < > 99 100* 100*  BUN 11   < > 12 10 13   CREATININE 0.73   < > 0.58 0.56 0.66  CALCIUM 7.9*   < > 8.4* 8.3* 8.1*  MG 2.1  --   --   --   --    < > = values in this interval not displayed.   Liver Function Tests: No results for input(s): AST, ALT, ALKPHOS, BILITOT, PROT, ALBUMIN in the last 8760 hours. No results for input(s): LIPASE, AMYLASE in the last 8760 hours. No results for input(s): AMMONIA in the last 8760 hours. CBC: Recent Labs    03/17/18 0835 03/18/18 0655 03/19/18 0340 03/24/18 0700  WBC 9.5 8.5 7.0 5.0  NEUTROABS 7.0  --  4.5 2.6  HGB 9.8* 10.7* 10.6* 10.8*  HCT 29.7* 31.5* 31.8* 32.3*  MCV 93.4 91.8 91.9 92.3  PLT 190 206 259 348   Cardiac Enzymes: No results for input(s): CKTOTAL, CKMB, CKMBINDEX, TROPONINI in the last 8760 hours. BNP: Invalid input(s): POCBNP CBG: Recent Labs    03/16/18 0040 03/16/18 0606  GLUCAP 97 114*    Procedures and Imaging Studies During Stay: Dg Chest Port 1 View  Result Date: 03/17/2018 CLINICAL DATA:  Cough today, some shortness of breath, recent leg surgery, history CHF, hypertension EXAM: PORTABLE CHEST 1 VIEW COMPARISON:  Portable exam 0914 hours compared to 03/15/2017 FINDINGS: Enlargement of cardiac silhouette with slight pulmonary vascular congestion. Atherosclerotic calcification aorta. Mediastinal contours stable. Interstitial infiltrates slightly increased since previous exam question minimal pulmonary edema. Bibasilar atelectasis. No segmental consolidation or pneumothorax. Bones demineralized. IMPRESSION: Enlargement of cardiac silhouette with bibasilar atelectasis and slightly increased perihilar infiltrates question minimal pulmonary edema. Electronically Signed   By: Lavonia Dana M.D.   On: 03/17/2018 11:30   Chest Portable 1 View  Result Date: 03/15/2018 CLINICAL DATA:  Preoperative evaluation, fall this morning, hypertension, COPD EXAM: PORTABLE CHEST 1 VIEW COMPARISON:  Portable  exam 0650 hours compared to 02/06/2016 FINDINGS: Enlargement of cardiac silhouette with pulmonary vascular congestion. Atherosclerotic calcification aorta. Bronchitic changes with LEFT basilar opacity question atelectasis versus infiltrate. Minimal RIGHT basilar atelectasis. Upper lungs clear. No gross pleural effusion or pneumothorax. Bones demineralized. IMPRESSION: Enlargement of cardiac silhouette with mild RIGHT basilar atelectasis and either atelectasis or consolidation at LEFT base. Electronically Signed   By: Lavonia Dana M.D.   On: 03/15/2018 07:09   Dg Knee Complete 4 Views Left  Result Date: 03/15/2018 CLINICAL DATA:  Left knee pain after fall EXAM: LEFT KNEE - COMPLETE 4+ VIEW COMPARISON:  None. FINDINGS: No acute fracture nor joint dislocation. Subchondral areas of osteopenia involving the weight-bearing portion of the medial femoral condyle may reflect chondral fissuring and/or thinning. Degenerative spurring is noted of the tibial spines. No joint effusion. IMPRESSION: Mild femorotibial joint osteoarthritis.  No acute fracture. Electronically Signed   By: [name] Royalty M.D.   On: 03/15/2018 03:43   Dg Knee Complete 4 Views Right  Result Date: 03/15/2018 CLINICAL DATA:  Patient fell today and has right knee pain. EXAM: RIGHT KNEE - COMPLETE 4+ VIEW COMPARISON:  None. FINDINGS: Acute oblique displaced closed fracture of the distal right femoral diaphysis extending into the metaphysis. There is of the 16 mm of medial displacement of the femoral condyles and distal fracture fragment relative to the femoral diaphysis. No intra-articular extension is identified. Osteoarthritic joint space narrowing of the femorotibial compartment is seen. The patellofemoral appears intact. No joint effusion. IMPRESSION: Acute, closed, oblique fracture of the distal femoral diaphysis exiting out the lateral femoral metaphysis  and with resultant 16 mm of medial displacement of the femoral condyles and distal fracture  fragment. Electronically Signed   By: [name] Royalty M.D.   On: 03/15/2018 03:41   Dg C-arm 1-60 Min  Result Date: 03/16/2018 CLINICAL DATA:  ORIF distal RIGHT femoral fracture EXAM: RIGHT FEMUR 2 VIEWS; RIGHT FEMUR PORTABLE 2 VIEW; DG C-ARM 61-120 MIN COMPARISON:  Preoperative evaluation 03/15/2018 FLUOROSCOPY TIME:  2 minutes 12 seconds Images obtained: 8 intraoperatively, 3 postoperatively FINDINGS: RIGHT femur 8 views: Digital C-arm fluoroscopic images obtained intraoperatively demonstrate sequential reduction of previously identified distal RIGHT femoral oblique metadiaphyseal fracture and placement of 2 cannulated screws. Additionally, a lateral plate and multiple screws was placed extending from the proximal to mid RIGHT femoral diaphysis to the lateral femoral condyle. Knee joint alignment normal. RIGHT knee two views: Images obtained postoperatively portably in recovery demonstrate lateral plate and multiple screws across a reduced oblique fracture of the distal RIGHT femoral diaphysis. An additional nondisplaced fracture plane seen extending into the mid femoral diaphysis. Knee joint alignment normal no degenerative changes and knee joint are present. Hip joint space is preserved. There is no evidence of fracture or other focal bone lesions. Soft tissues are unremarkable. IMPRESSION: Intraoperative and postoperative images of the RIGHT femur demonstrating ORIF for previously identified distal RIGHT femur fracture. Note is made of an additional oblique fracture plane extending into the mid to distal RIGHT femoral diaphysis, better visualized than on preoperative exam. Electronically Signed   By: Lavonia Dana M.D.   On: 03/16/2018 13:42   Dg Hip Unilat W Or Wo Pelvis 2-3 Views Right  Result Date: 03/15/2018 CLINICAL DATA:  Right hip pain after fall today. EXAM: DG HIP (WITH OR WITHOUT PELVIS) 2-3V RIGHT COMPARISON:  None. FINDINGS: AP view the pelvis demonstrates degenerative disc disease of the included  lower lumbar spine with spurring off the endplates from L2 through S1. Osteoarthritis of the SI joints and pubic symphysis without diastasis. No acute pelvic fracture. Left greater than right joint space narrowing of the hips without fracture or joint dislocation. Femoral heads are spherical in appearance without flattening. IMPRESSION: Lower lumbar degenerative disc facet arthropathy. No acute pelvic or hip fracture. Osteoarthritis of the SI joints, pubic symphysis and left greater than right hip joint space narrowing. Electronically Signed   By: [name] Royalty M.D.   On: 03/15/2018 03:45   Dg Femur, Min 2 Views Right  Result Date: 03/16/2018 CLINICAL DATA:  ORIF distal RIGHT femoral fracture EXAM: RIGHT FEMUR 2 VIEWS; RIGHT FEMUR PORTABLE 2 VIEW; DG C-ARM 61-120 MIN COMPARISON:  Preoperative evaluation 03/15/2018 FLUOROSCOPY TIME:  2 minutes 12 seconds Images obtained: 8 intraoperatively, 3 postoperatively FINDINGS: RIGHT femur 8 views: Digital C-arm fluoroscopic images obtained intraoperatively demonstrate sequential reduction of previously identified distal RIGHT femoral oblique metadiaphyseal fracture and placement of 2 cannulated screws. Additionally, a lateral plate and multiple screws was placed extending from the proximal to mid RIGHT femoral diaphysis to the lateral femoral condyle. Knee joint alignment normal. RIGHT knee two views: Images obtained postoperatively portably in recovery demonstrate lateral plate and multiple screws across a reduced oblique fracture of the distal RIGHT femoral diaphysis. An additional nondisplaced fracture plane seen extending into the mid femoral diaphysis. Knee joint alignment normal no degenerative changes and knee joint are present. Hip joint space is preserved. There is no evidence of fracture or other focal bone lesions. Soft tissues are unremarkable. IMPRESSION: Intraoperative and postoperative images of the RIGHT femur demonstrating ORIF for previously identified  distal RIGHT femur fracture. Note is made of an additional oblique fracture plane extending into the mid to distal RIGHT femoral diaphysis, better visualized than on preoperative exam. Electronically Signed   By: Lavonia Dana M.D.   On: 03/16/2018 13:42   Dg Femur Min 2 Views Right  Result Date: 03/15/2018 CLINICAL DATA:  Pain after fall. EXAM: RIGHT FEMUR 2 VIEWS COMPARISON:  None. FINDINGS: Acute, closed, oblique fracture of the right distal femoral diaphysis exiting the lateral femoral metaphysis. Resultant medial displacement of the distal fracture fragment which includes both femoral condyles at least 1/2 shaft width. Osteoarthritis of the femorotibial compartment with spurring. No intra-articular extension of fracture. No joint effusion. IMPRESSION: Acute oblique distal femoral diaphyseal fracture exiting the lateral femoral metaphysis with resultant medial displacement of the distal fracture fragment by 1/2 shaft width. Osteoarthritic joint space narrowing of the femorotibial compartment. Electronically Signed   By: [name] Royalty M.D.   On: 03/15/2018 03:47   Dg Femur Port, Min 2 Views Right  Result Date: 03/16/2018 CLINICAL DATA:  ORIF distal RIGHT femoral fracture EXAM: RIGHT FEMUR 2 VIEWS; RIGHT FEMUR PORTABLE 2 VIEW; DG C-ARM 61-120 MIN COMPARISON:  Preoperative evaluation 03/15/2018 FLUOROSCOPY TIME:  2 minutes 12 seconds Images obtained: 8 intraoperatively, 3 postoperatively FINDINGS: RIGHT femur 8 views: Digital C-arm fluoroscopic images obtained intraoperatively demonstrate sequential reduction of previously identified distal RIGHT femoral oblique metadiaphyseal fracture and placement of 2 cannulated screws. Additionally, a lateral plate and multiple screws was placed extending from the proximal to mid RIGHT femoral diaphysis to the lateral femoral condyle. Knee joint alignment normal. RIGHT knee two views: Images obtained postoperatively portably in recovery demonstrate lateral plate and multiple  screws across a reduced oblique fracture of the distal RIGHT femoral diaphysis. An additional nondisplaced fracture plane seen extending into the mid femoral diaphysis. Knee joint alignment normal no degenerative changes and knee joint are present. Hip joint space is preserved. There is no evidence of fracture or other focal bone lesions. Soft tissues are unremarkable. IMPRESSION: Intraoperative and postoperative images of the RIGHT femur demonstrating ORIF for previously identified distal RIGHT femur fracture. Note is made of an additional oblique fracture plane extending into the mid to distal RIGHT femoral diaphysis, better visualized than on preoperative exam. Electronically Signed   By: Lavonia Dana M.D.   On: 03/16/2018 13:42    Assessment/Plan:    #1 history of right distal femoral fracture status post repair after a fall- she continues on Lovenox for DVT prophylaxis she will have orthopedic follow-up on Monday-she does have Tylenol as needed for pain and appears that this is effective again family prefers no stronger pain medication since she does not apparently do well and gets increasingly confused apparently on the oxycodone  2.-History of diastolic CHF she appears to be stable she has been restarted on low-dose Lasix sodium continues to be stable but this will need updating next week by home health and primary care provider notified of results.  3.-  History of COPD with recent left lung infiltrate-she has completed antibiotic and appears to be doing well at this point she continues on BRE O Ellipta as well as Flonase and Spiriva- at times still has some baseline coughing but it apparently this is somewhat chronic.  4.-  History of postop anemia this appears to have stabilized with hemoglobin of 10.8 on most recent lab we will update this before discharge  5.-  History of hypertension as noted above this appears stable on losartan and Lasix  blood pressure today 129/83 this appears to be  relatively baseline.  6.-History of hypothyroidism she is on Synthroid-appears TSH last year was within normal limits will order an updated TSH before discharge--   She will be going home with family who is very supportive she will need continued PT and OT and will have orthopedic follow-up again she appears to be doing well with Tylenol for her pain. --We will have orthopedic contacted about the need to continue Lovenox  [id number]-of note greater than 30 minutes spent on this discharge summary- greater than 50% of time spent coordinating a plan of care for numerous diagnoses

## 2018-04-08 ENCOUNTER — Encounter (HOSPITAL_COMMUNITY)
Admission: RE | Admit: 2018-04-08 | Discharge: 2018-04-08 | Disposition: A | Discharge status: Home / Self Care | Payer: Medicare HMO | Source: Skilled Nursing Facility | Attending: Internal Medicine | Admitting: Internal Medicine

## 2018-04-08 DIAGNOSIS — D649 Anemia, unspecified: Secondary | ICD-10-CM | POA: Insufficient documentation

## 2018-04-08 DIAGNOSIS — E785 Hyperlipidemia, unspecified: Secondary | ICD-10-CM | POA: Insufficient documentation

## 2018-04-08 LAB — CBC WITH DIFFERENTIAL/PLATELET
BASOS PCT: 1 %
Basophils Absolute: 0 10*3/uL (ref 0.0–0.1)
Eosinophils Absolute: 0.1 10*3/uL (ref 0.0–0.7)
Eosinophils Relative: 2 %
HCT: 36.2 % (ref 36.0–46.0)
HEMOGLOBIN: 12 g/dL (ref 12.0–15.0)
LYMPHS PCT: 34 %
Lymphs Abs: 1.2 10*3/uL (ref 0.7–4.0)
MCH: 31.2 pg (ref 26.0–34.0)
MCHC: 33.1 g/dL (ref 30.0–36.0)
MCV: 94 fL (ref 78.0–100.0)
MONO ABS: 0.6 10*3/uL (ref 0.1–1.0)
MONOS PCT: 18 %
Neutro Abs: 1.6 10*3/uL — ABNORMAL LOW (ref 1.7–7.7)
Neutrophils Relative %: 45 %
Platelets: 266 10*3/uL (ref 150–400)
RBC: 3.85 MIL/uL — ABNORMAL LOW (ref 3.87–5.11)
RDW: 14.6 % (ref 11.5–15.5)
WBC: 3.4 10*3/uL — ABNORMAL LOW (ref 4.0–10.5)

## 2018-04-08 LAB — TSH: TSH: 1.762 u[IU]/mL (ref 0.350–4.500)

## 2018-04-09 ENCOUNTER — Encounter (HOSPITAL_COMMUNITY): Payer: Self-pay | Admitting: Emergency Medicine

## 2018-04-09 ENCOUNTER — Emergency Department (HOSPITAL_COMMUNITY)
Admission: EM | Admit: 2018-04-09 | Discharge: 2018-04-09 | Disposition: A | Discharge status: Home / Self Care | Payer: Medicare HMO | Attending: Emergency Medicine | Admitting: Emergency Medicine

## 2018-04-09 ENCOUNTER — Emergency Department (HOSPITAL_COMMUNITY): Payer: Medicare HMO

## 2018-04-09 DIAGNOSIS — R0602 Shortness of breath: Secondary | ICD-10-CM | POA: Diagnosis not present

## 2018-04-09 DIAGNOSIS — J189 Pneumonia, unspecified organism: Secondary | ICD-10-CM | POA: Insufficient documentation

## 2018-04-09 DIAGNOSIS — I110 Hypertensive heart disease with heart failure: Secondary | ICD-10-CM | POA: Diagnosis not present

## 2018-04-09 DIAGNOSIS — I509 Heart failure, unspecified: Secondary | ICD-10-CM | POA: Diagnosis not present

## 2018-04-09 DIAGNOSIS — Z7982 Long term (current) use of aspirin: Secondary | ICD-10-CM | POA: Insufficient documentation

## 2018-04-09 DIAGNOSIS — I5033 Acute on chronic diastolic (congestive) heart failure: Secondary | ICD-10-CM | POA: Diagnosis not present

## 2018-04-09 DIAGNOSIS — R05 Cough: Secondary | ICD-10-CM | POA: Diagnosis not present

## 2018-04-09 DIAGNOSIS — Z79899 Other long term (current) drug therapy: Secondary | ICD-10-CM | POA: Diagnosis not present

## 2018-04-09 DIAGNOSIS — E871 Hypo-osmolality and hyponatremia: Secondary | ICD-10-CM | POA: Diagnosis not present

## 2018-04-09 DIAGNOSIS — J449 Chronic obstructive pulmonary disease, unspecified: Secondary | ICD-10-CM | POA: Diagnosis not present

## 2018-04-09 DIAGNOSIS — M810 Age-related osteoporosis without current pathological fracture: Secondary | ICD-10-CM | POA: Diagnosis not present

## 2018-04-09 DIAGNOSIS — N3281 Overactive bladder: Secondary | ICD-10-CM | POA: Diagnosis not present

## 2018-04-09 DIAGNOSIS — E785 Hyperlipidemia, unspecified: Secondary | ICD-10-CM | POA: Diagnosis not present

## 2018-04-09 LAB — CBC WITH DIFFERENTIAL/PLATELET
Basophils Absolute: 0 10*3/uL (ref 0.0–0.1)
Basophils Relative: 0 %
Eosinophils Absolute: 0.1 10*3/uL (ref 0.0–0.7)
Eosinophils Relative: 1 %
HCT: 35.5 % — ABNORMAL LOW (ref 36.0–46.0)
HEMOGLOBIN: 12 g/dL (ref 12.0–15.0)
LYMPHS ABS: 1.3 10*3/uL (ref 0.7–4.0)
LYMPHS PCT: 31 %
MCH: 31.3 pg (ref 26.0–34.0)
MCHC: 33.8 g/dL (ref 30.0–36.0)
MCV: 92.7 fL (ref 78.0–100.0)
MONOS PCT: 15 %
Monocytes Absolute: 0.6 10*3/uL (ref 0.1–1.0)
NEUTROS PCT: 53 %
Neutro Abs: 2.2 10*3/uL (ref 1.7–7.7)
Platelets: 278 10*3/uL (ref 150–400)
RBC: 3.83 MIL/uL — ABNORMAL LOW (ref 3.87–5.11)
RDW: 14.4 % (ref 11.5–15.5)
WBC: 4.3 10*3/uL (ref 4.0–10.5)

## 2018-04-09 LAB — BASIC METABOLIC PANEL
ANION GAP: 5 (ref 5–15)
BUN: 12 mg/dL (ref 8–23)
CO2: 28 mmol/L (ref 22–32)
Calcium: 7.9 mg/dL — ABNORMAL LOW (ref 8.9–10.3)
Chloride: 96 mmol/L — ABNORMAL LOW (ref 98–111)
Creatinine, Ser: 0.65 mg/dL (ref 0.44–1.00)
GLUCOSE: 110 mg/dL — AB (ref 70–99)
POTASSIUM: 3.5 mmol/L (ref 3.5–5.1)
Sodium: 129 mmol/L — ABNORMAL LOW (ref 135–145)

## 2018-04-09 LAB — BRAIN NATRIURETIC PEPTIDE: B Natriuretic Peptide: 61 pg/mL (ref 0.0–100.0)

## 2018-04-09 LAB — I-STAT TROPONIN, ED: TROPONIN I, POC: 0 ng/mL (ref 0.00–0.08)

## 2018-04-09 MED ORDER — SODIUM CHLORIDE 0.9 % IV SOLN
1.0000 g | Freq: Once | INTRAVENOUS | Status: AC
  Administered 2018-04-09: 1 g via INTRAVENOUS
  Filled 2018-04-09: qty 10

## 2018-04-09 MED ORDER — IPRATROPIUM-ALBUTEROL 0.5-2.5 (3) MG/3ML IN SOLN
3.0000 mL | Freq: Once | RESPIRATORY_TRACT | Status: AC
  Administered 2018-04-09: 3 mL via RESPIRATORY_TRACT
  Filled 2018-04-09: qty 3

## 2018-04-09 MED ORDER — FUROSEMIDE 10 MG/ML IJ SOLN
40.0000 mg | Freq: Once | INTRAMUSCULAR | Status: AC
  Administered 2018-04-09: 40 mg via INTRAVENOUS
  Filled 2018-04-09: qty 4

## 2018-04-09 MED ORDER — AZITHROMYCIN 250 MG PO TABS: ORAL_TABLET | ORAL | 0 refills | Status: DC

## 2018-04-09 MED ORDER — AZITHROMYCIN 250 MG PO TABS
500.0000 mg | ORAL_TABLET | Freq: Once | ORAL | Status: AC
  Administered 2018-04-09: 500 mg via ORAL
  Filled 2018-04-09: qty 2

## 2018-04-09 NOTE — ED Provider Notes (Signed)
[facility] EMERGENCY DEPARTMENT Provider Note   CSN: [id number] Arrival date & time: 04/09/18  1647     History   Chief Complaint Chief Complaint  Patient presents with  . Cough    HPI Carrie Crawford is a 84 y.o. female.  Pt presents to the ED today with a cough.  The pt has been at [facility] after rehab for a femur fracture.  She was discharged from there this morning.  She has had a bad cough, so her son thought she needed to be evaluated for that.  Pt said she feels ok, but can't stop coughing.  Pt's son said she was d/c from [facility], but has not been able to walk yet.  He is her sole caregiver.  He said the [place] came out today for the initial interview.  She is the one who thought pt should go to the hospital.  The pt does have a hoyer lift and a bedside commode that was delivered today.  The son has hired a company to sit with pt, but they don't start until Monday.  Alvis Lemmings also does not start until Monday or Tuesday.      Past Medical History:  Diagnosis Date  . Arthritis    "spine" (03/16/2018)  . CHF (congestive heart failure) ([place])   . Chronic bronchitis ([place])   . Compression fracture of lumbar vertebra ([place])   . Dyslipidemia   . Hypertension   . Multiple allergies   . Osteopenia   . Rhinitis, chronic     Patient Active Problem List   Diagnosis Date Noted  . Right femoral fracture ([place]) 03/15/2018  . Anemia 03/15/2018  . Hyponatremia 03/15/2018  . Hyperglycemia 04/29/2017  . Obesity (BMI 30-39.9) 02/17/2017  . OAB (overactive bladder) 02/16/2017  . Age-related osteoporosis with current pathological fracture 04/23/2016  . Estrogen deficiency 04/23/2016  . Fracture of posterior thoracic vertebral body with routine healing 04/23/2016  . Diastolic dysfunction with acute on chronic heart failure ([place]) 02/17/2016  . Routine general medical examination at a health care facility 04/23/2015  . Hyperlipidemia with target LDL less than 130     . Hypertension 04/26/2013  . Rhinitis, chronic 01/16/2011  . Obstructive chronic bronchitis without exacerbation ([place]) 01/16/2011    Past Surgical History:  Procedure Laterality Date  . CATARACT EXTRACTION W/ INTRAOCULAR LENS  IMPLANT, BILATERAL Bilateral   . CHOLECYSTECTOMY    . FRACTURE SURGERY    . OPEN REDUCTION INTERNAL FIXATION (ORIF) DISTAL RADIAL FRACTURE Right 03/16/2018  . ORIF FEMUR FRACTURE Right 03/16/2018   Procedure: OPEN REDUCTION INTERNAL FIXATION (ORIF) DISTAL FEMUR FRACTURE;  Surgeon: Shona Needles, MD;  Location: [facility];  Service: Orthopedics;  Laterality: Right;  . THYROIDECTOMY, PARTIAL  2004   [place]     OB History   None      Home Medications    Prior to Admission medications   Medication Sig Start Date End Date Taking? Authorizing Provider  acetaminophen (TYLENOL) 325 MG tablet Take 650 mg by mouth every 6 (six) hours as needed.    [provider]  aspirin 81 MG tablet Take 81 mg by mouth daily.      [provider]  azelastine (ASTELIN) 0.1 % nasal spray 2 sprays in each nostril at bedtime. 09/16/17   Eustaquio Maize, MD  azithromycin (ZITHROMAX) 250 MG tablet Take  1 every day until finished. 04/09/18   Isla Pence, MD  Calcium Carbonate-Vitamin D 600-400 MG-UNIT tablet  Take 1 tablet by mouth 2 (two) times daily. 04/14/16   Nche, Charlene Brooke, NP  enoxaparin (LOVENOX) 40 MG/0.4ML injection Inject 0.4 mLs (40 mg total) into the skin daily. 03/19/18 04/18/18  Florencia Reasons, MD  fluticasone [facility]) 50 MCG/ACT nasal spray 2 SPRAYS IN EACH NOSTRIL QD 09/16/17   Eustaquio Maize, MD  fluticasone furoate-vilanterol (BREO ELLIPTA) 100-25 MCG/INH AEPB INHALE 1 PUFF ONCE DAILY AS DIRECTED 09/10/17   Eustaquio Maize, MD  furosemide (LASIX) 20 MG tablet Take 1 tablet (20 mg total) by mouth every Monday, Wednesday, and Friday. 03/18/18   Florencia Reasons, MD  losartan (COZAAR) 100 MG tablet Take 1 tablet (100 mg total) by mouth daily. 09/10/17   Eustaquio Maize, MD   Multiple Vitamins-Minerals (MULTIVITAMIN WITH MINERALS) tablet Take 1 tablet by mouth daily.    [provider]  potassium chloride SA (K-DUR,KLOR-CON) 20 MEQ tablet Take 20 mEq by mouth daily.    [provider]  tiotropium (SPIRIVA HANDIHALER) 18 MCG inhalation capsule INHALE THE CONTENTS OF ONE CAPSULE ONCE DAILY 09/10/17   Eustaquio Maize, MD    Family History Family History  Problem Relation Age of Onset  . Heart disease Brother   . Cancer Daughter        endometial/uterine cancer(nodule in right lung)    Social History Social History   Tobacco Use  . Smoking status: Never Smoker  . Smokeless tobacco: Never Used  Substance Use Topics  . Alcohol use: No    Alcohol/week: 0.0 standard drinks  . Drug use: No     Allergies   Patient has no known allergies.   Review of Systems Review of Systems  Respiratory: Positive for cough and shortness of breath.   All other systems reviewed and are negative.    Physical Exam Updated Vital Signs BP (!) 167/124   Pulse 88   Temp 98.1 F (36.7 C) (Oral)   Resp 20   Ht 5\' 2"  (1.575 m)   Wt 91 kg   SpO2 93%   BMI 36.69 kg/m   Physical Exam  Constitutional: She is oriented to person, place, and time. She appears well-developed and well-nourished.  HENT:  Head: Normocephalic and atraumatic.  Right Ear: External ear normal.  Left Ear: External ear normal.  Nose: Nose normal.  Mouth/Throat: Oropharynx is clear and moist.  Eyes: Pupils are equal, round, and reactive to light. Conjunctivae and EOM are normal.  Neck: Normal range of motion. Neck supple.  Cardiovascular: Normal rate, regular rhythm, normal heart sounds and intact distal pulses.  Pulmonary/Chest: Effort normal. She has wheezes.  Abdominal: Soft. Bowel sounds are normal.  Musculoskeletal: Normal range of motion.  Neurological: She is alert and oriented to person, place, and time.  Skin: Skin is warm and dry. Capillary refill takes less than 2  seconds.  Psychiatric: She has a normal mood and affect. Her behavior is normal. Judgment and thought content normal.  Nursing note and vitals reviewed.    ED Treatments / Results  Labs (all labs ordered are listed, but only abnormal results are displayed) Labs Reviewed  CBC WITH DIFFERENTIAL/PLATELET - Abnormal; Notable for the following components:      Result Value   RBC 3.83 (*)    HCT 35.5 (*)    All other components within normal limits  BASIC METABOLIC PANEL - Abnormal; Notable for the following components:   Sodium 129 (*)    Chloride 96 (*)    Glucose, Bld 110 (*)  Calcium 7.9 (*)    All other components within normal limits  BRAIN NATRIURETIC PEPTIDE  I-STAT TROPONIN, ED    EKG None  Radiology Dg Chest 2 View  Result Date: 04/09/2018 CLINICAL DATA:  Shortness of breath with cough EXAM: CHEST - 2 VIEW COMPARISON:  03/17/2018, 03/15/2017 FINDINGS: Small pleural effusions. Cardiomegaly with vascular congestion. Patchy atelectasis or infiltrates at the left greater than right lung base. Aortic atherosclerosis. No pneumothorax. IMPRESSION: 1. Small pleural effusions with patchy left greater than right basilar atelectasis or infiltrates 2. Cardiomegaly with vascular congestion Electronically Signed   By: Donavan Foil M.D.   On: 04/09/2018 18:23    Procedures Procedures (including critical care time)  Medications Ordered in ED Medications  ipratropium-albuterol (DUONEB) 0.5-2.5 (3) MG/3ML nebulizer solution 3 mL (3 mLs Nebulization Given 04/09/18 1808)  furosemide (LASIX) injection 40 mg (40 mg Intravenous Given 04/09/18 1937)  cefTRIAXone (ROCEPHIN) 1 g in sodium chloride 0.9 % 100 mL IVPB (0 g Intravenous Stopped 04/09/18 2007)  azithromycin (ZITHROMAX) tablet 500 mg (500 mg Oral Given 04/09/18 1938)     Initial Impression / Assessment and Plan / ED Course  I have reviewed the triage vital signs and the nursing notes.  Pertinent labs & imaging results that were  available during my care of the patient were reviewed by me and considered in my medical decision making (see chart for details).    Pt does not ambulate, so we can't check ambulation with pulse ox.  O2 sat sitting is good and she does not have sob when she moves in bed.  She does have hyponatremia, but that looks to be chronic.    She is given a dose of rocephin and zithromax in ED.  She will be d/c home on zithromax.  I will not increase her lasix at home as that will likely decrease her sodium more, but she was given 1 dose in ED.  She knows to return if worse and to f/u with pcp.  Her son is going to try to get some assistance at home tomorrow, but at this time, she does not meet admission criteria.  She does have home health in the works.  Final Clinical Impressions(s) / ED Diagnoses   Final diagnoses:  Congestive heart failure, unspecified HF chronicity, unspecified heart failure type ([place])  Community acquired pneumonia, unspecified laterality  Hyponatremia    ED Discharge Orders         Ordered    azithromycin (ZITHROMAX) 250 MG tablet     04/09/18 2011           Isla Pence, MD 04/09/18 2018

## 2018-04-09 NOTE — ED Triage Notes (Signed)
Pt was at [facility] for rehab from femur fracture.  Was d/c at 10am this morning and they return for evaluation of congested cough.

## 2018-04-09 NOTE — ED Notes (Signed)
Pt does not ambulate.

## 2018-04-09 NOTE — ED Notes (Signed)
Purewik placed on pt at this time.

## 2018-04-11 DIAGNOSIS — S72451D Displaced supracondylar fracture without intracondylar extension of lower end of right femur, subsequent encounter for closed fracture with routine healing: Secondary | ICD-10-CM | POA: Diagnosis not present

## 2018-04-13 ENCOUNTER — Ambulatory Visit: Payer: Medicare HMO | Admitting: Family Medicine

## 2018-04-13 DIAGNOSIS — M5136 Other intervertebral disc degeneration, lumbar region: Secondary | ICD-10-CM | POA: Diagnosis not present

## 2018-04-13 DIAGNOSIS — I5032 Chronic diastolic (congestive) heart failure: Secondary | ICD-10-CM | POA: Diagnosis not present

## 2018-04-13 DIAGNOSIS — J449 Chronic obstructive pulmonary disease, unspecified: Secondary | ICD-10-CM | POA: Diagnosis not present

## 2018-04-13 DIAGNOSIS — M80051D Age-related osteoporosis with current pathological fracture, right femur, subsequent encounter for fracture with routine healing: Secondary | ICD-10-CM | POA: Diagnosis not present

## 2018-04-13 DIAGNOSIS — M479 Spondylosis, unspecified: Secondary | ICD-10-CM | POA: Diagnosis not present

## 2018-04-13 DIAGNOSIS — I110 Hypertensive heart disease with heart failure: Secondary | ICD-10-CM | POA: Diagnosis not present

## 2018-04-14 ENCOUNTER — Inpatient Hospital Stay (HOSPITAL_COMMUNITY)
Admission: EM | Admit: 2018-04-14 | Discharge: 2018-04-18 | DRG: 202 | Disposition: A | Discharge status: Skilled Nursing Facility | Payer: Medicare HMO | Attending: Internal Medicine | Admitting: Internal Medicine

## 2018-04-14 ENCOUNTER — Other Ambulatory Visit: Payer: Self-pay

## 2018-04-14 ENCOUNTER — Emergency Department (HOSPITAL_COMMUNITY): Payer: Medicare HMO

## 2018-04-14 ENCOUNTER — Encounter (HOSPITAL_COMMUNITY): Payer: Self-pay

## 2018-04-14 ENCOUNTER — Encounter: Payer: Self-pay | Admitting: Family Medicine

## 2018-04-14 ENCOUNTER — Ambulatory Visit (INDEPENDENT_AMBULATORY_CARE_PROVIDER_SITE_OTHER): Payer: Medicare HMO | Admitting: Family Medicine

## 2018-04-14 VITALS — BP 118/67 | HR 75 | Temp 98.4°F

## 2018-04-14 DIAGNOSIS — I700 Atherosclerosis of aorta: Secondary | ICD-10-CM | POA: Diagnosis present

## 2018-04-14 DIAGNOSIS — J9601 Acute respiratory failure with hypoxia: Secondary | ICD-10-CM | POA: Diagnosis not present

## 2018-04-14 DIAGNOSIS — Z5189 Encounter for other specified aftercare: Secondary | ICD-10-CM | POA: Diagnosis not present

## 2018-04-14 DIAGNOSIS — Z7951 Long term (current) use of inhaled steroids: Secondary | ICD-10-CM

## 2018-04-14 DIAGNOSIS — F0390 Unspecified dementia without behavioral disturbance: Secondary | ICD-10-CM | POA: Diagnosis present

## 2018-04-14 DIAGNOSIS — I110 Hypertensive heart disease with heart failure: Secondary | ICD-10-CM | POA: Diagnosis present

## 2018-04-14 DIAGNOSIS — W19XXXA Unspecified fall, initial encounter: Secondary | ICD-10-CM | POA: Diagnosis not present

## 2018-04-14 DIAGNOSIS — Z8049 Family history of malignant neoplasm of other genital organs: Secondary | ICD-10-CM | POA: Diagnosis not present

## 2018-04-14 DIAGNOSIS — Z7982 Long term (current) use of aspirin: Secondary | ICD-10-CM

## 2018-04-14 DIAGNOSIS — J209 Acute bronchitis, unspecified: Secondary | ICD-10-CM | POA: Diagnosis not present

## 2018-04-14 DIAGNOSIS — S51811A Laceration without foreign body of right forearm, initial encounter: Secondary | ICD-10-CM

## 2018-04-14 DIAGNOSIS — R404 Transient alteration of awareness: Secondary | ICD-10-CM | POA: Diagnosis not present

## 2018-04-14 DIAGNOSIS — Z09 Encounter for follow-up examination after completed treatment for conditions other than malignant neoplasm: Secondary | ICD-10-CM | POA: Diagnosis not present

## 2018-04-14 DIAGNOSIS — J189 Pneumonia, unspecified organism: Secondary | ICD-10-CM

## 2018-04-14 DIAGNOSIS — J42 Unspecified chronic bronchitis: Secondary | ICD-10-CM | POA: Diagnosis not present

## 2018-04-14 DIAGNOSIS — G9340 Encephalopathy, unspecified: Secondary | ICD-10-CM | POA: Diagnosis not present

## 2018-04-14 DIAGNOSIS — I10 Essential (primary) hypertension: Secondary | ICD-10-CM | POA: Diagnosis not present

## 2018-04-14 DIAGNOSIS — Z9841 Cataract extraction status, right eye: Secondary | ICD-10-CM

## 2018-04-14 DIAGNOSIS — Z7401 Bed confinement status: Secondary | ICD-10-CM | POA: Diagnosis not present

## 2018-04-14 DIAGNOSIS — I5030 Unspecified diastolic (congestive) heart failure: Secondary | ICD-10-CM | POA: Diagnosis not present

## 2018-04-14 DIAGNOSIS — Z8249 Family history of ischemic heart disease and other diseases of the circulatory system: Secondary | ICD-10-CM | POA: Diagnosis not present

## 2018-04-14 DIAGNOSIS — S7291XD Unspecified fracture of right femur, subsequent encounter for closed fracture with routine healing: Secondary | ICD-10-CM

## 2018-04-14 DIAGNOSIS — R293 Abnormal posture: Secondary | ICD-10-CM | POA: Diagnosis not present

## 2018-04-14 DIAGNOSIS — R410 Disorientation, unspecified: Secondary | ICD-10-CM | POA: Diagnosis not present

## 2018-04-14 DIAGNOSIS — J181 Lobar pneumonia, unspecified organism: Secondary | ICD-10-CM | POA: Diagnosis not present

## 2018-04-14 DIAGNOSIS — M6281 Muscle weakness (generalized): Secondary | ICD-10-CM | POA: Diagnosis not present

## 2018-04-14 DIAGNOSIS — Z961 Presence of intraocular lens: Secondary | ICD-10-CM | POA: Diagnosis present

## 2018-04-14 DIAGNOSIS — I5032 Chronic diastolic (congestive) heart failure: Secondary | ICD-10-CM | POA: Diagnosis present

## 2018-04-14 DIAGNOSIS — R531 Weakness: Secondary | ICD-10-CM | POA: Diagnosis not present

## 2018-04-14 DIAGNOSIS — Z9842 Cataract extraction status, left eye: Secondary | ICD-10-CM

## 2018-04-14 DIAGNOSIS — E876 Hypokalemia: Secondary | ICD-10-CM | POA: Diagnosis not present

## 2018-04-14 DIAGNOSIS — M8580 Other specified disorders of bone density and structure, unspecified site: Secondary | ICD-10-CM | POA: Diagnosis not present

## 2018-04-14 DIAGNOSIS — Z6839 Body mass index (BMI) 39.0-39.9, adult: Secondary | ICD-10-CM | POA: Diagnosis not present

## 2018-04-14 DIAGNOSIS — R0989 Other specified symptoms and signs involving the circulatory and respiratory systems: Secondary | ICD-10-CM | POA: Diagnosis not present

## 2018-04-14 DIAGNOSIS — S72301D Unspecified fracture of shaft of right femur, subsequent encounter for closed fracture with routine healing: Secondary | ICD-10-CM | POA: Diagnosis not present

## 2018-04-14 DIAGNOSIS — E785 Hyperlipidemia, unspecified: Secondary | ICD-10-CM | POA: Diagnosis not present

## 2018-04-14 DIAGNOSIS — I959 Hypotension, unspecified: Secondary | ICD-10-CM | POA: Diagnosis not present

## 2018-04-14 DIAGNOSIS — S72431S Displaced fracture of medial condyle of right femur, sequela: Secondary | ICD-10-CM

## 2018-04-14 DIAGNOSIS — E871 Hypo-osmolality and hyponatremia: Secondary | ICD-10-CM | POA: Diagnosis not present

## 2018-04-14 DIAGNOSIS — J310 Chronic rhinitis: Secondary | ICD-10-CM | POA: Diagnosis present

## 2018-04-14 DIAGNOSIS — J449 Chronic obstructive pulmonary disease, unspecified: Secondary | ICD-10-CM | POA: Diagnosis not present

## 2018-04-14 DIAGNOSIS — R0902 Hypoxemia: Secondary | ICD-10-CM | POA: Diagnosis not present

## 2018-04-14 DIAGNOSIS — R2681 Unsteadiness on feet: Secondary | ICD-10-CM | POA: Diagnosis not present

## 2018-04-14 DIAGNOSIS — I5033 Acute on chronic diastolic (congestive) heart failure: Secondary | ICD-10-CM | POA: Diagnosis not present

## 2018-04-14 DIAGNOSIS — R5381 Other malaise: Secondary | ICD-10-CM | POA: Diagnosis not present

## 2018-04-14 DIAGNOSIS — R278 Other lack of coordination: Secondary | ICD-10-CM | POA: Diagnosis not present

## 2018-04-14 DIAGNOSIS — R0689 Other abnormalities of breathing: Secondary | ICD-10-CM | POA: Diagnosis not present

## 2018-04-14 LAB — CBC WITH DIFFERENTIAL/PLATELET
Basophils Absolute: 0 10*3/uL (ref 0.0–0.1)
Basophils Relative: 0 %
EOS ABS: 0.2 10*3/uL (ref 0.0–0.7)
EOS PCT: 2 %
HCT: 37.6 % (ref 36.0–46.0)
HEMOGLOBIN: 12.4 g/dL (ref 12.0–15.0)
LYMPHS ABS: 1.5 10*3/uL (ref 0.7–4.0)
LYMPHS PCT: 21 %
MCH: 31 pg (ref 26.0–34.0)
MCHC: 33 g/dL (ref 30.0–36.0)
MCV: 94 fL (ref 78.0–100.0)
MONOS PCT: 16 %
Monocytes Absolute: 1.2 10*3/uL — ABNORMAL HIGH (ref 0.1–1.0)
NEUTROS ABS: 4.5 10*3/uL (ref 1.7–7.7)
Neutrophils Relative %: 61 %
PLATELETS: 311 10*3/uL (ref 150–400)
RBC: 4 MIL/uL (ref 3.87–5.11)
RDW: 14.3 % (ref 11.5–15.5)
WBC: 7.4 10*3/uL (ref 4.0–10.5)

## 2018-04-14 LAB — COMPREHENSIVE METABOLIC PANEL
ALBUMIN: 3 g/dL — AB (ref 3.5–5.0)
ALK PHOS: 78 U/L (ref 38–126)
ALT: 23 U/L (ref 0–44)
ANION GAP: 7 (ref 5–15)
AST: 21 U/L (ref 15–41)
BILIRUBIN TOTAL: 0.6 mg/dL (ref 0.3–1.2)
BUN: 12 mg/dL (ref 8–23)
CO2: 27 mmol/L (ref 22–32)
Calcium: 8.2 mg/dL — ABNORMAL LOW (ref 8.9–10.3)
Chloride: 99 mmol/L (ref 98–111)
Creatinine, Ser: 0.61 mg/dL (ref 0.44–1.00)
GFR calc Af Amer: >60 mL/min (ref >60)
GFR calc non Af Amer: >60 mL/min (ref >60)
GLUCOSE: 117 mg/dL — AB (ref 70–99)
POTASSIUM: 3.3 mmol/L — AB (ref 3.5–5.1)
SODIUM: 133 mmol/L — AB (ref 135–145)
Total Protein: 6.8 g/dL (ref 6.5–8.1)

## 2018-04-14 LAB — URINALYSIS, ROUTINE W REFLEX MICROSCOPIC
BILIRUBIN URINE: NEGATIVE
GLUCOSE, UA: NEGATIVE mg/dL
Hgb urine dipstick: NEGATIVE
Ketones, ur: NEGATIVE mg/dL
Leukocytes, UA: NEGATIVE
Nitrite: NEGATIVE
Protein, ur: NEGATIVE mg/dL
SPECIFIC GRAVITY, URINE: 1.016 (ref 1.005–1.030)
pH: 6 (ref 5.0–8.0)

## 2018-04-14 LAB — TROPONIN I: Troponin I: <0.03 ng/mL (ref <0.03)

## 2018-04-14 LAB — PROCALCITONIN

## 2018-04-14 LAB — BRAIN NATRIURETIC PEPTIDE: B NATRIURETIC PEPTIDE 5: 73 pg/mL (ref 0.0–100.0)

## 2018-04-14 MED ORDER — CALCIUM CARBONATE-VITAMIN D 500-200 MG-UNIT PO TABS
1.0000 | ORAL_TABLET | Freq: Two times a day (BID) | ORAL | Status: DC
  Administered 2018-04-15 – 2018-04-18 (×7): 1 via ORAL
  Filled 2018-04-14 (×9): qty 1

## 2018-04-14 MED ORDER — IPRATROPIUM-ALBUTEROL 0.5-2.5 (3) MG/3ML IN SOLN
3.0000 mL | Freq: Four times a day (QID) | RESPIRATORY_TRACT | Status: DC
  Administered 2018-04-14 – 2018-04-15 (×5): 3 mL via RESPIRATORY_TRACT
  Filled 2018-04-14 (×5): qty 3

## 2018-04-14 MED ORDER — ADULT MULTIVITAMIN W/MINERALS CH
1.0000 | ORAL_TABLET | Freq: Every day | ORAL | Status: DC
  Administered 2018-04-15 – 2018-04-18 (×4): 1 via ORAL
  Filled 2018-04-14 (×4): qty 1

## 2018-04-14 MED ORDER — SODIUM CHLORIDE 0.9 % IV SOLN
1.0000 g | INTRAVENOUS | Status: DC
  Filled 2018-04-14 (×2): qty 10

## 2018-04-14 MED ORDER — LOSARTAN POTASSIUM 50 MG PO TABS
100.0000 mg | ORAL_TABLET | Freq: Every day | ORAL | Status: DC
  Administered 2018-04-15 – 2018-04-18 (×4): 100 mg via ORAL
  Filled 2018-04-14 (×4): qty 2

## 2018-04-14 MED ORDER — POTASSIUM CHLORIDE CRYS ER 20 MEQ PO TBCR
40.0000 meq | EXTENDED_RELEASE_TABLET | Freq: Once | ORAL | Status: AC
  Administered 2018-04-14: 40 meq via ORAL
  Filled 2018-04-14: qty 2

## 2018-04-14 MED ORDER — ACETAMINOPHEN 325 MG PO TABS
650.0000 mg | ORAL_TABLET | Freq: Four times a day (QID) | ORAL | Status: DC | PRN
  Administered 2018-04-15: 650 mg via ORAL
  Filled 2018-04-14: qty 2

## 2018-04-14 MED ORDER — DM-GUAIFENESIN ER 30-600 MG PO TB12
1.0000 | ORAL_TABLET | Freq: Two times a day (BID) | ORAL | Status: DC
  Administered 2018-04-14 – 2018-04-18 (×8): 1 via ORAL
  Filled 2018-04-14 (×8): qty 1

## 2018-04-14 MED ORDER — PREDNISONE 20 MG PO TABS
40.0000 mg | ORAL_TABLET | Freq: Every day | ORAL | Status: DC
  Administered 2018-04-14 – 2018-04-18 (×5): 40 mg via ORAL
  Filled 2018-04-14 (×5): qty 2

## 2018-04-14 MED ORDER — DOXYCYCLINE HYCLATE 100 MG PO TABS
100.0000 mg | ORAL_TABLET | Freq: Two times a day (BID) | ORAL | Status: DC
  Administered 2018-04-14: 100 mg via ORAL
  Filled 2018-04-14: qty 1

## 2018-04-14 MED ORDER — ASPIRIN 81 MG PO CHEW
81.0000 mg | CHEWABLE_TABLET | Freq: Every day | ORAL | Status: DC
  Administered 2018-04-15 – 2018-04-18 (×4): 81 mg via ORAL
  Filled 2018-04-14 (×4): qty 1

## 2018-04-14 MED ORDER — ENOXAPARIN SODIUM 40 MG/0.4ML ~~LOC~~ SOLN
40.0000 mg | SUBCUTANEOUS | Status: DC
  Administered 2018-04-14 – 2018-04-17 (×4): 40 mg via SUBCUTANEOUS
  Filled 2018-04-14 (×4): qty 0.4

## 2018-04-14 NOTE — ED Provider Notes (Signed)
[facility] EMERGENCY DEPARTMENT Provider Note   CSN: [id number] Arrival date & time: 04/14/18  1454     History   Chief Complaint Chief Complaint  Patient presents with  . Shortness of Breath    HPI Carrie Crawford is a 84 y.o. female.  HPI  Patient presents with altered mental status.  Brought in as a follow-up visit to her primary care doctor today after recent ER visit and recent hospitalization.  Had been treated for pneumonia.  Reportedly has been more confused and not eating as much.  Patient states she went to the doctor is not sure why she is here.  Past Medical History:  Diagnosis Date  . Arthritis    "spine" (03/16/2018)  . CHF (congestive heart failure) ([place])   . Chronic bronchitis ([place])   . Compression fracture of lumbar vertebra ([place])   . Dyslipidemia   . Hypertension   . Multiple allergies   . Osteopenia   . Rhinitis, chronic     Patient Active Problem List   Diagnosis Date Noted  . Aortic atherosclerosis ([place]) 04/14/2018  . Right femoral fracture ([place]) 03/15/2018  . Anemia 03/15/2018  . Hyponatremia 03/15/2018  . Hyperglycemia 04/29/2017  . Obesity (BMI 30-39.9) 02/17/2017  . OAB (overactive bladder) 02/16/2017  . Age-related osteoporosis with current pathological fracture 04/23/2016  . Estrogen deficiency 04/23/2016  . Fracture of posterior thoracic vertebral body with routine healing 04/23/2016  . Diastolic dysfunction with acute on chronic heart failure ([place]) 02/17/2016  . Routine general medical examination at a health care facility 04/23/2015  . Hyperlipidemia with target LDL less than 130   . Hypertension 04/26/2013  . Rhinitis, chronic 01/16/2011  . Obstructive chronic bronchitis without exacerbation ([place]) 01/16/2011    Past Surgical History:  Procedure Laterality Date  . CATARACT EXTRACTION W/ INTRAOCULAR LENS  IMPLANT, BILATERAL Bilateral   . CHOLECYSTECTOMY    . FRACTURE SURGERY    . OPEN REDUCTION INTERNAL FIXATION (ORIF) DISTAL  RADIAL FRACTURE Right 03/16/2018  . ORIF FEMUR FRACTURE Right 03/16/2018   Procedure: OPEN REDUCTION INTERNAL FIXATION (ORIF) DISTAL FEMUR FRACTURE;  Surgeon: Shona Needles, MD;  Location: [facility];  Service: Orthopedics;  Laterality: Right;  . THYROIDECTOMY, PARTIAL  2004   [place]     OB History   None      Home Medications    Prior to Admission medications   Medication Sig Start Date End Date Taking? Authorizing Provider  acetaminophen (TYLENOL) 325 MG tablet Take 650 mg by mouth every 6 (six) hours as needed.   Yes [provider]  aspirin 81 MG tablet Take 81 mg by mouth daily.     Yes [provider]  azelastine (ASTELIN) 0.1 % nasal spray 2 sprays in each nostril at bedtime. 09/16/17  Yes Eustaquio Maize, MD  azithromycin (ZITHROMAX) 250 MG tablet Take 250 mg by mouth daily. 04/09/18 04/14/18 Yes [provider]  Calcium Carbonate-Vitamin D 600-400 MG-UNIT tablet Take 1 tablet by mouth 2 (two) times daily. 04/14/16  Yes Nche, Charlene Brooke, NP  enoxaparin (LOVENOX) 40 MG/0.4ML injection Inject 0.4 mLs (40 mg total) into the skin daily. 03/19/18 04/18/18 Yes Florencia Reasons, MD  fluticasone [facility]) 50 MCG/ACT nasal spray 2 SPRAYS IN EACH NOSTRIL QD 09/16/17  Yes Eustaquio Maize, MD  fluticasone furoate-vilanterol (BREO ELLIPTA) 100-25 MCG/INH AEPB INHALE 1 PUFF ONCE DAILY AS DIRECTED 09/10/17  Yes Eustaquio Maize, MD  furosemide (LASIX) 20 MG tablet Take 1 tablet (20 mg total)  by mouth every Monday, Wednesday, and Friday. 03/18/18  Yes Florencia Reasons, MD  losartan (COZAAR) 100 MG tablet Take 1 tablet (100 mg total) by mouth daily. 09/10/17  Yes Eustaquio Maize, MD  Multiple Vitamins-Minerals (MULTIVITAMIN WITH MINERALS) tablet Take 1 tablet by mouth daily.   Yes [provider]  potassium chloride SA (K-DUR,KLOR-CON) 20 MEQ tablet Take 20 mEq by mouth daily.   Yes [provider]  tiotropium (SPIRIVA HANDIHALER) 18 MCG inhalation capsule INHALE THE CONTENTS OF ONE  CAPSULE ONCE DAILY 09/10/17  Yes Eustaquio Maize, MD    Family History Family History  Problem Relation Age of Onset  . Heart disease Brother   . Cancer Daughter        endometial/uterine cancer(nodule in right lung)    Social History Social History   Tobacco Use  . Smoking status: Never Smoker  . Smokeless tobacco: Never Used  Substance Use Topics  . Alcohol use: No    Alcohol/week: 0.0 standard drinks  . Drug use: No     Allergies   Patient has no known allergies.   Review of Systems Review of Systems  Unable to perform ROS: Mental status change     Physical Exam Updated Vital Signs BP (!) 159/91   Pulse 77   Temp 97.9 F (36.6 C) (Oral)   Resp 19   SpO2 97%   Physical Exam  Constitutional: She appears well-developed.  HENT:  Head: Normocephalic.  Neck: Neck supple.  Cardiovascular: Normal rate.  Pulmonary/Chest:  Diffuse harsh breath sounds.  Abdominal: There is no tenderness.  Musculoskeletal: She exhibits edema.  Some pitting edema bilateral lower extremities.  Neurological: She is alert.  Awake and pleasant but some confusion.  Skin: Skin is warm. Capillary refill takes less than 2 seconds.     ED Treatments / Results  Labs (all labs ordered are listed, but only abnormal results are displayed) Labs Reviewed  COMPREHENSIVE METABOLIC PANEL - Abnormal; Notable for the following components:      Result Value   Sodium 133 (*)    Potassium 3.3 (*)    Glucose, Bld 117 (*)    Calcium 8.2 (*)    Albumin 3.0 (*)    All other components within normal limits  CBC WITH DIFFERENTIAL/PLATELET - Abnormal; Notable for the following components:   Monocytes Absolute 1.2 (*)    All other components within normal limits  URINALYSIS, ROUTINE W REFLEX MICROSCOPIC - Abnormal; Notable for the following components:   Color, Urine AMBER (*)    APPearance HAZY (*)    All other components within normal limits  BRAIN NATRIURETIC PEPTIDE  TROPONIN I     EKG EKG Interpretation  Date/Time:  Thursday April 14 2018 15:05:16 EDT Ventricular Rate:  76 PR Interval:    QRS Duration: 108 QT Interval:  398 QTC Calculation: 445 R Axis:   -16 Text Interpretation:  Sinus rhythm Borderline left axis deviation Low voltage, precordial leads Consider anterior infarct Borderline T abnormalities, inferior leads Confirmed by Davonna Belling [phone]) on 04/14/2018 3:17:28 PM   Radiology Dg Chest 2 View  Result Date: 04/14/2018 CLINICAL DATA:  Chest congestion, decreased mental status, hypersomnolence. Recently hospitalized for CHF, hyponatremia, and community-acquired pneumonia. Femur fracture on September 3rd 2019. EXAM: CHEST - 2 VIEW COMPARISON:  Chest x-ray of April 09, 2018 FINDINGS: The lungs are reasonably well inflated. The retrocardiac region on the left remains dense with obscuration of the hemidiaphragm. The right lung is clear. The cardiac silhouette  is enlarged. The central pulmonary vascularity is mildly prominent. There is no definite pulmonary edema. There is calcification in the wall of the thoracic aorta. The bony thorax exhibits no acute abnormality. IMPRESSION: Increased density in the left retrocardiac region compatible with atelectasis or pneumonia. This has not changed greatly since the previous study. Cardiomegaly without pulmonary edema. Thoracic aortic atherosclerosis. Electronically Signed   By: David  [place]ique M.D.   On: 04/14/2018 16:23    Procedures Procedures (including critical care time)  Medications Ordered in ED Medications - No data to display   Initial Impression / Assessment and Plan / ED Course  I have reviewed the triage vital signs and the nursing notes.  Pertinent labs & imaging results that were available during my care of the patient were reviewed by me and considered in my medical decision making (see chart for details).     Patient with shortness of breath and confusion.  Differential diagnosis  recently with pneumonia.  Has been on antibiotics.  However has had sats that we will dip in 280s.  Has had a cough.  Generalized weakness with some confusion.  Will admit to hospitalist.  Final Clinical Impressions(s) / ED Diagnoses   Final diagnoses:  Community acquired pneumonia, unspecified laterality  Encephalopathy    ED Discharge Orders    None       Davonna Belling, MD 04/14/18 [phone]

## 2018-04-14 NOTE — ED Triage Notes (Signed)
Pt sent from [facility].  Provider reports pt had a femur fracture on 9/3 and was d/c'd to a SNF.  Reports was dc'd from SNF on 9/28 and then admitted to hospital for CHF, hyponatremia, and CAP shortly after d/c.  Pt recently finished zpack and was evaluated in office today.  Provider says pt still congested, obtunded, falls asleep while talking.  O2 sat 90-92% and increased confusion, fatigue, and weakness.

## 2018-04-14 NOTE — Progress Notes (Signed)
Subjective:    Patient ID: Danella Maiers, female    DOB: 09-Aug-1925, 84 y.o.   MRN: [id number]  Chief Complaint:  Hospitalization Follow-up (in ER on 9/28 with pneumonia, still having productive cough; [facility] nurse there yesterday, still felt like fluid in right lung, complains of right lung hurting when she coughs; home health aide noticed rattling in chest yesterday)   HPI: GWENYTH DINGEE is a 84 y.o. female presenting on 04/14/2018 for Hospitalization Follow-up (in ER on 9/28 with pneumonia, still having productive cough; [place] there yesterday, still felt like fluid in right lung, complains of right lung hurting when she coughs; home health aide noticed rattling in chest yesterday)  Pt presents today for Emergency Department follow-up. Pt was seen on 04-09-18 and diagnosed with CAP, CHF, and hyponatremia. Pt had a recent right femur fracture with ORIF. Pt was discharged home after IV rocephin and oral Zithromax. Pt completed oral Zithromax today. Pt has not improved since discharge. Family and caregiver state she has been experiencing increased fatigue, weakness, confusion, shortness of breath, and cough. They report she has had chills and a decreased appetite. Pt obtunded in office, will arouse to stimuli, but immediately falls back to sleep. Pt confused. Family states she has some memory loss, but this has increased since her femur fracture.   Relevant past medical, surgical, family, and social history reviewed and updated as indicated.  Allergies and medications reviewed and updated.   Past Medical History:  Diagnosis Date  . Arthritis    "spine" (03/16/2018)  . CHF (congestive heart failure) ([place])   . Chronic bronchitis ([place])   . Compression fracture of lumbar vertebra ([place])   . Dyslipidemia   . Hypertension   . Multiple allergies   . Osteopenia   . Rhinitis, chronic     Past Surgical History:  Procedure Laterality Date  . CATARACT EXTRACTION W/ INTRAOCULAR LENS   IMPLANT, BILATERAL Bilateral   . CHOLECYSTECTOMY    . FRACTURE SURGERY    . OPEN REDUCTION INTERNAL FIXATION (ORIF) DISTAL RADIAL FRACTURE Right 03/16/2018  . ORIF FEMUR FRACTURE Right 03/16/2018   Procedure: OPEN REDUCTION INTERNAL FIXATION (ORIF) DISTAL FEMUR FRACTURE;  Surgeon: Shona Needles, MD;  Location: [facility];  Service: Orthopedics;  Laterality: Right;  . THYROIDECTOMY, PARTIAL  2004   [facility]    Social History   Socioeconomic History  . Marital status: Widowed    Spouse name: Not on file  . Number of children: Not on file  . Years of education: Not on file  . Highest education level: Not on file  Occupational History  . Not on file  Social Needs  . Financial resource strain: Not on file  . Food insecurity:    Worry: Not on file    Inability: Not on file  . Transportation needs:    Medical: Not on file    Non-medical: Not on file  Tobacco Use  . Smoking status: Never Smoker  . Smokeless tobacco: Never Used  Substance and Sexual Activity  . Alcohol use: No    Alcohol/week: 0.0 standard drinks  . Drug use: No  . Sexual activity: Not on file  Lifestyle  . Physical activity:    Days per week: Not on file    Minutes per session: Not on file  . Stress: Not on file  Relationships  . Social connections:    Talks on phone: Not on file    Gets together: Not on file  Attends religious service: Not on file    Active member of club or organization: Not on file    Attends meetings of clubs or organizations: Not on file    Relationship status: Not on file  . Intimate partner violence:    Fear of current or ex partner: Not on file    Emotionally abused: Not on file    Physically abused: Not on file    Forced sexual activity: Not on file  Other Topics Concern  . Not on file  Social History Narrative  . Not on file    Outpatient Encounter Medications as of 04/14/2018  Medication Sig  . acetaminophen (TYLENOL) 325 MG tablet Take 650 mg by mouth every 6 (six) hours as  needed.  Marland Kitchen aspirin 81 MG tablet Take 81 mg by mouth daily.    Marland Kitchen azelastine (ASTELIN) 0.1 % nasal spray 2 sprays in each nostril at bedtime.  . Calcium Carbonate-Vitamin D 600-400 MG-UNIT tablet Take 1 tablet by mouth 2 (two) times daily.  Marland Kitchen enoxaparin (LOVENOX) 40 MG/0.4ML injection Inject 0.4 mLs (40 mg total) into the skin daily.  . fluticasone (FLONASE) 50 MCG/ACT nasal spray 2 SPRAYS IN EACH NOSTRIL QD  . fluticasone furoate-vilanterol (BREO ELLIPTA) 100-25 MCG/INH AEPB INHALE 1 PUFF ONCE DAILY AS DIRECTED  . furosemide (LASIX) 20 MG tablet Take 1 tablet (20 mg total) by mouth every Monday, Wednesday, and Friday.  . losartan (COZAAR) 100 MG tablet Take 1 tablet (100 mg total) by mouth daily.  . Multiple Vitamins-Minerals (MULTIVITAMIN WITH MINERALS) tablet Take 1 tablet by mouth daily.  . potassium chloride SA (K-DUR,KLOR-CON) 20 MEQ tablet Take 20 mEq by mouth daily.  Marland Kitchen tiotropium (SPIRIVA HANDIHALER) 18 MCG inhalation capsule INHALE THE CONTENTS OF ONE CAPSULE ONCE DAILY  . [DISCONTINUED] azithromycin (ZITHROMAX) 250 MG tablet Take  1 every day until finished.   No facility-administered encounter medications on file as of 04/14/2018.     No Known Allergies  Review of Systems  Constitutional: Positive for activity change, appetite change, chills, fatigue and fever.  HENT: Positive for congestion.   Respiratory: Positive for cough, shortness of breath (orthopnea) and wheezing.   Cardiovascular: Positive for chest pain and leg swelling.  Psychiatric/Behavioral: Positive for confusion.        Objective:    BP 118/67   Pulse 75   Temp 98.4 F (36.9 C) (Oral)   SpO2 90%    Wt Readings from Last 3 Encounters:  04/09/18 200 lb 9.9 oz (91 kg)  03/15/18 200 lb (90.7 kg)  02/07/18 189 lb 9.6 oz (86 kg)    Physical Exam  Constitutional: She appears well-developed. She appears lethargic. She appears distressed (obtunded).  Neck: No JVD present.  Cardiovascular: Normal rate,  regular rhythm and intact distal pulses. Exam reveals gallop and S3.  Pulmonary/Chest: She is in respiratory distress (mild). She has decreased breath sounds. She has wheezes. She has rhonchi in the left lower field. She has rales in the right lower field and the left lower field.  Musculoskeletal: She exhibits edema (1+ edema to bilateral lower extremities ).  Neurological: She appears lethargic. She is disoriented.  Skin: Skin is warm and dry. Capillary refill takes less than 2 seconds.     Psychiatric: She is slowed. Cognition and memory are impaired.    Results for orders placed or performed during the hospital encounter of 04/09/18  Brain natriuretic peptide  Result Value Ref Range   B Natriuretic Peptide 61.0 0.0 - 100.0  pg/mL  CBC with Differential  Result Value Ref Range   WBC 4.3 4.0 - 10.5 K/uL   RBC 3.83 (L) 3.87 - 5.11 MIL/uL   Hemoglobin 12.0 12.0 - 15.0 g/dL   HCT 35.5 (L) 36.0 - 46.0 %   MCV 92.7 78.0 - 100.0 fL   MCH 31.3 26.0 - 34.0 pg   MCHC 33.8 30.0 - 36.0 g/dL   RDW 14.4 11.5 - 15.5 %   Platelets 278 150 - 400 K/uL   Neutrophils Relative % 53 %   Neutro Abs 2.2 1.7 - 7.7 K/uL   Lymphocytes Relative 31 %   Lymphs Abs 1.3 0.7 - 4.0 K/uL   Monocytes Relative 15 %   Monocytes Absolute 0.6 0.1 - 1.0 K/uL   Eosinophils Relative 1 %   Eosinophils Absolute 0.1 0.0 - 0.7 K/uL   Basophils Relative 0 %   Basophils Absolute 0.0 0.0 - 0.1 K/uL  Basic metabolic panel  Result Value Ref Range   Sodium 129 (L) 135 - 145 mmol/L   Potassium 3.5 3.5 - 5.1 mmol/L   Chloride 96 (L) 98 - 111 mmol/L   CO2 28 22 - 32 mmol/L   Glucose, Bld 110 (H) 70 - 99 mg/dL   BUN 12 8 - 23 mg/dL   Creatinine, Ser 0.65 0.44 - 1.00 mg/dL   Calcium 7.9 (L) 8.9 - 10.3 mg/dL   GFR calc non Af Amer >60 >60 mL/min   GFR calc Af Amer >60 >60 mL/min   Anion gap 5 5 - 15  I-stat troponin, ED  Result Value Ref Range   Troponin i, poc 0.00 0.00 - 0.08 ng/mL   Comment 3             Total time  spent with pt and consults greater than 60 minutes Assessment & Plan:  Rami Waddle presents today for emergency department follow up  1. Hospital discharge follow-up  2. Aortic atherosclerosis ([place])  3. Hyponatremia  4. Diastolic dysfunction with acute on chronic heart failure (HCC)  5. Closed displaced fracture of medial condyle of right femur, sequela  6. Skin tear of right forearm without complication, initial encounter Cleaned and dressed with nonstick dressing in office.    Continue all other maintenance medications.  Follow up plan: After evaluation of pt, discussion with Dr. Warrick Parisian and family, it is agreed upon that the pt should return to the ED for further work up and possible admission. Ambulance called. Deferred in office studies, as pt is being sent to the ED.  Pt sent to the Emergency Department, [facility] - spoke with staff, aware of pt status.  Monia Pouch, FNP-C [facility] [phone]

## 2018-04-14 NOTE — ED Notes (Signed)
Dressing to right FA, dry and intact.  Dressing is from home.

## 2018-04-14 NOTE — H&P (Addendum)
History and Physical    ARYONNA GUNNERSON YWV:[id number] DOB: Sep 24, 1925 DOA: 04/14/2018  PCP: Eustaquio Maize, MD   Patient coming from: Home   Chief Complaint: Cough  HPI: DERRY ARBOGAST is a 84 y.o. female with medical history significant of hypertension, hyperlipidemia, chronic bronchitis, CHF, recent right femur fracture with ORIF presenting to the hospital with a chief complaint of a cough productive of sputum.  Patient states she was diagnosed with pneumonia of few days ago and has been feeling weak since then.  She has been taking azithromycin without any improvement.  Dates her back hurts when she coughs.  Denies having any chest pain, fever, chills, or shortness of breath.  Reports having a low appetite.  Family at bedside confirm that patient has been taking Lovenox shots every day since she had her hip fracture; last dose was taken today.  Per chart review, patient was recently seen in the ED on April 09, 2018 and treated for pneumonia with IV Rocephin and oral azithromycin.  She was then seen at [facility] on April 14, 2018 for a follow-up found to have not improved since discharge.  Patient was found to be obtunded in their office, arousing to stimuli but immediately falling back asleep.  As well as noted to be confused.  As such, an ambulance was called and she was sent [facility] ED.  ED Course: Vitals on arrival to the ED-afebrile, pulse 80, respiratory rate 22, blood pressure 165/83, and SPO2 92% on room air.  Noted to have oxygen sats in the 80s in the ED.  Labs showing no leukocytosis.  Urinalysis not suggestive of infection.  Chest x-ray done in the ED today showing increased density in the left retrocardiac region compatible with atelectasis or pneumonia; no significant change since previous study.  EKG showing sinus rhythm with borderline left axis deviation; similar to prior tracing.  Review of Systems: As per HPI otherwise 10 point review of systems  negative.    Past Medical History:  Diagnosis Date  . Arthritis    "spine" (03/16/2018)  . CHF (congestive heart failure) ([place])   . Chronic bronchitis ([place])   . Compression fracture of lumbar vertebra ([place])   . Dyslipidemia   . Hypertension   . Multiple allergies   . Osteopenia   . Rhinitis, chronic     Past Surgical History:  Procedure Laterality Date  . CATARACT EXTRACTION W/ INTRAOCULAR LENS  IMPLANT, BILATERAL Bilateral   . CHOLECYSTECTOMY    . FRACTURE SURGERY    . OPEN REDUCTION INTERNAL FIXATION (ORIF) DISTAL RADIAL FRACTURE Right 03/16/2018  . ORIF FEMUR FRACTURE Right 03/16/2018   Procedure: OPEN REDUCTION INTERNAL FIXATION (ORIF) DISTAL FEMUR FRACTURE;  Surgeon: Shona Needles, MD;  Location: [facility];  Service: Orthopedics;  Laterality: Right;  . THYROIDECTOMY, PARTIAL  2004   [place]   Social history  reports that she has never smoked. She has never used smokeless tobacco. She reports that she does not drink alcohol or use drugs.  No Known Allergies  Family History  Problem Relation Age of Onset  . Heart disease Brother   . Cancer Daughter        endometial/uterine cancer(nodule in right lung)    Prior to Admission medications   Medication Sig Start Date End Date Taking? Authorizing Provider  acetaminophen (TYLENOL) 325 MG tablet Take 650 mg by mouth every 6 (six) hours as needed.   Yes [provider]  aspirin 81 MG tablet Take  81 mg by mouth daily.     Yes [provider]  azelastine (ASTELIN) 0.1 % nasal spray 2 sprays in each nostril at bedtime. 09/16/17  Yes Eustaquio Maize, MD  azithromycin (ZITHROMAX) 250 MG tablet Take 250 mg by mouth daily. 04/09/18 04/14/18 Yes [provider]  Calcium Carbonate-Vitamin D 600-400 MG-UNIT tablet Take 1 tablet by mouth 2 (two) times daily. 04/14/16  Yes Nche, Charlene Brooke, NP  enoxaparin (LOVENOX) 40 MG/0.4ML injection Inject 0.4 mLs (40 mg total) into the skin daily. 03/19/18 04/18/18 Yes Florencia Reasons, MD    fluticasone [facility]) 50 MCG/ACT nasal spray 2 SPRAYS IN EACH NOSTRIL QD 09/16/17  Yes Eustaquio Maize, MD  fluticasone furoate-vilanterol (BREO ELLIPTA) 100-25 MCG/INH AEPB INHALE 1 PUFF ONCE DAILY AS DIRECTED 09/10/17  Yes Eustaquio Maize, MD  furosemide (LASIX) 20 MG tablet Take 1 tablet (20 mg total) by mouth every Monday, Wednesday, and Friday. 03/18/18  Yes Florencia Reasons, MD  losartan (COZAAR) 100 MG tablet Take 1 tablet (100 mg total) by mouth daily. 09/10/17  Yes Eustaquio Maize, MD  Multiple Vitamins-Minerals (MULTIVITAMIN WITH MINERALS) tablet Take 1 tablet by mouth daily.   Yes [provider]  potassium chloride SA (K-DUR,KLOR-CON) 20 MEQ tablet Take 20 mEq by mouth daily.   Yes [provider]  tiotropium (SPIRIVA HANDIHALER) 18 MCG inhalation capsule INHALE THE CONTENTS OF ONE CAPSULE ONCE DAILY 09/10/17  Yes Eustaquio Maize, MD    Physical Exam: Vitals:   04/14/18 1830 04/14/18 1930 04/14/18 2017 04/14/18 2048  BP: (!) 163/91 (!) 172/92 (!) 173/90   Pulse: 74 79 87   Resp: 17 (!) 22 18   Temp:   (!) 97.5 F (36.4 C)   TempSrc:   Oral   SpO2: 97% 93% 98% 96%  Weight:   86 kg     Constitutional: NAD, calm, comfortable Vitals:   04/14/18 1830 04/14/18 1930 04/14/18 2017 04/14/18 2048  BP: (!) 163/91 (!) 172/92 (!) 173/90   Pulse: 74 79 87   Resp: 17 (!) 22 18   Temp:   (!) 97.5 F (36.4 C)   TempSrc:   Oral   SpO2: 97% 93% 98% 96%  Weight:   86 kg    Physical Exam  Constitutional: No distress.  Coughing  HENT:  Mouth/Throat: Oropharynx is clear and moist.  Eyes: Right eye exhibits no discharge. Left eye exhibits no discharge.  Neck: Neck supple. No tracheal deviation present.  Cardiovascular: Normal rate, regular rhythm and intact distal pulses.  Pulmonary/Chest: Effort normal. She has no wheezes.  Coarse breath sounds throughout.  Abdominal: Soft. Bowel sounds are normal. She exhibits no distension. There is no tenderness.  Musculoskeletal: She  exhibits no edema.  Neurological: She is alert.  Skin: Skin is warm and dry. She is not diaphoretic.   Labs on Admission: I have personally reviewed following labs and imaging studies  CBC: Recent Labs  Lab 04/08/18 0700 04/09/18 1718 04/14/18 1530  WBC 3.4* 4.3 7.4  NEUTROABS 1.6* 2.2 4.5  HGB 12.0 12.0 12.4  HCT 36.2 35.5* 37.6  MCV 94.0 92.7 94.0  PLT 266 278 540   Basic Metabolic Panel: Recent Labs  Lab 04/09/18 1746 04/14/18 1530  NA 129* 133*  K 3.5 3.3*  CL 96* 99  CO2 28 27  GLUCOSE 110* 117*  BUN 12 12  CREATININE 0.65 0.61  CALCIUM 7.9* 8.2*   GFR: Estimated Creatinine Clearance: 47.6 mL/min (by C-G formula based on  SCr of 0.61 mg/dL). Liver Function Tests: Recent Labs  Lab 04/14/18 1530  AST 21  ALT 23  ALKPHOS 78  BILITOT 0.6  PROT 6.8  ALBUMIN 3.0*   No results for input(s): LIPASE, AMYLASE in the last 168 hours. No results for input(s): AMMONIA in the last 168 hours. Coagulation Profile: No results for input(s): INR, PROTIME in the last 168 hours. Cardiac Enzymes: Recent Labs  Lab 04/14/18 1530  TROPONINI <0.03   BNP (last 3 results) No results for input(s): PROBNP in the last 8760 hours. HbA1C: No results for input(s): HGBA1C in the last 72 hours. CBG: No results for input(s): GLUCAP in the last 168 hours. Lipid Profile: No results for input(s): CHOL, HDL, LDLCALC, TRIG, CHOLHDL, LDLDIRECT in the last 72 hours. Thyroid Function Tests: No results for input(s): TSH, T4TOTAL, FREET4, T3FREE, THYROIDAB in the last 72 hours. Anemia Panel: No results for input(s): VITAMINB12, FOLATE, FERRITIN, TIBC, IRON, RETICCTPCT in the last 72 hours. Urine analysis:    Component Value Date/Time   COLORURINE AMBER (A) 04/14/2018 1514   APPEARANCEUR HAZY (A) 04/14/2018 1514   LABSPEC 1.016 04/14/2018 1514   PHURINE 6.0 04/14/2018 1514   GLUCOSEU NEGATIVE 04/14/2018 1514   GLUCOSEU NEGATIVE 04/07/2016 1525   HGBUR NEGATIVE 04/14/2018 1514    BILIRUBINUR NEGATIVE 04/14/2018 1514   BILIRUBINUR negative 04/07/2016 1408   KETONESUR NEGATIVE 04/14/2018 1514   PROTEINUR NEGATIVE 04/14/2018 1514   UROBILINOGEN 0.2 04/07/2016 1525   NITRITE NEGATIVE 04/14/2018 1514   LEUKOCYTESUR NEGATIVE 04/14/2018 1514    Radiological Exams on Admission: Dg Chest 2 View  Result Date: 04/14/2018 CLINICAL DATA:  Chest congestion, decreased mental status, hypersomnolence. Recently hospitalized for CHF, hyponatremia, and community-acquired pneumonia. Femur fracture on September 3rd 2019. EXAM: CHEST - 2 VIEW COMPARISON:  Chest x-ray of April 09, 2018 FINDINGS: The lungs are reasonably well inflated. The retrocardiac region on the left remains dense with obscuration of the hemidiaphragm. The right lung is clear. The cardiac silhouette is enlarged. The central pulmonary vascularity is mildly prominent. There is no definite pulmonary edema. There is calcification in the wall of the thoracic aorta. The bony thorax exhibits no acute abnormality. IMPRESSION: Increased density in the left retrocardiac region compatible with atelectasis or pneumonia. This has not changed greatly since the previous study. Cardiomegaly without pulmonary edema. Thoracic aortic atherosclerosis. Electronically Signed   By: David  Martinique M.D.   On: 04/14/2018 16:23    EKG: Independently reviewed. Sinus rhythm with borderline left axis deviation; similar to prior tracing.  Assessment/Plan Principal Problem:   Bronchitis, chronic with acute exacerbation (HCC)  Acute exacerbation of chronic bronchitis Noted to be hypoxemic sats in the 80s in the ED and required supplemental oxygen.  Recently treated for community-acquired pneumonia with a azithromycin. Chest x-ray done in the ED today showing increased density in the left retrocardiac region compatible with atelectasis or questionable pneumonia.  Although pneumonia less likely as she does not have leukocytosis and procalcitonin negative.   PE less likely as she is not tachycardic and has been taking Lovenox for anticoagulation due to history of recent right femur fracture. -Discontinue antibiotics (Ceftriaxone and doxycycline were previously ordered for procalcitonin resulted) -Scheduled DuoNebs every 6 hours -Prednisone 40 mg daily -Mucinex -Incentive spirometry -Supplemental oxygen  Mild hypokalemia Potassium 3.3.  Likely secondary to poor oral intake. -K. Dur 40 mEq once -Repeat BMP in a.m.  Congestive heart failure Stable.  Currently volume overloaded.  BNP normal.  Echo done in May 2019 showing  EF 60 to 65% and grade 1 diastolic dysfunction. -Hold home Lasix at this time -Continue home losartan  Recent right femur fracture with ORIF -Patient has finished her course of Lovenox.  Last dose was administered at home prior to admission.  Deconditioning -PT consult  Diet: Heart healthy  DVT prophylaxis: Lovenox Code Status: Patient wishes to be full code. Family Communication: Daughter at bedside and son on phone have been updated. Disposition Plan: Anticipate discharge in 1 to 2 days. Consults called: None Admission status: Inpatient It is my clinical opinion that admission to INPATIENT is reasonable and necessary in this 84 y.o. female . presenting with acute exacerbation of chronic bronchitis requiring supplemental oxygen . in the context of PMH including: Chronic bronchitis, recently treated for pneumonia  Given the aforementioned, the predictability of an adverse outcome is felt to be significant. I expect that the patient will require at least 2 midnights in the hospital to treat this condition.   Shela Leff MD [facility] Pager [phone]  If 7PM-7AM, please contact night-coverage [email or web address] Password [email or web address]  04/15/2018, 12:11 AM

## 2018-04-14 NOTE — ED Notes (Signed)
Patient's o2 sat was 90% on room air When patient talked o2 sat dropped to 89% 2 lpm o2 patient's sat increased to 96%

## 2018-04-15 ENCOUNTER — Other Ambulatory Visit: Payer: Self-pay

## 2018-04-15 DIAGNOSIS — E876 Hypokalemia: Secondary | ICD-10-CM

## 2018-04-15 DIAGNOSIS — J209 Acute bronchitis, unspecified: Principal | ICD-10-CM

## 2018-04-15 DIAGNOSIS — I5032 Chronic diastolic (congestive) heart failure: Secondary | ICD-10-CM

## 2018-04-15 DIAGNOSIS — J42 Unspecified chronic bronchitis: Secondary | ICD-10-CM

## 2018-04-15 DIAGNOSIS — R5381 Other malaise: Secondary | ICD-10-CM

## 2018-04-15 LAB — BASIC METABOLIC PANEL
Anion gap: 8 (ref 5–15)
BUN: 12 mg/dL (ref 8–23)
CALCIUM: 8.6 mg/dL — AB (ref 8.9–10.3)
CHLORIDE: 100 mmol/L (ref 98–111)
CO2: 27 mmol/L (ref 22–32)
CREATININE: 0.57 mg/dL (ref 0.44–1.00)
GFR calc Af Amer: >60 mL/min (ref >60)
Glucose, Bld: 139 mg/dL — ABNORMAL HIGH (ref 70–99)
Potassium: 4.1 mmol/L (ref 3.5–5.1)
SODIUM: 135 mmol/L (ref 135–145)

## 2018-04-15 MED ORDER — IPRATROPIUM-ALBUTEROL 0.5-2.5 (3) MG/3ML IN SOLN
3.0000 mL | Freq: Three times a day (TID) | RESPIRATORY_TRACT | Status: DC
  Administered 2018-04-16 – 2018-04-18 (×8): 3 mL via RESPIRATORY_TRACT
  Filled 2018-04-15 (×8): qty 3

## 2018-04-15 NOTE — NC FL2 (Signed)
[place] LEVEL OF CARE SCREENING TOOL     IDENTIFICATION  Patient Name: DEVOIRY CORRIHER Birthdate: [birth year] Sex: female Admission Date (Current Location): 04/14/2018  [facility] and [place] Number:  [employer] and Address:  [facility] [address], [place]      Provider Number: [id number]  Attending Physician Name and Address:  Cristal Ford, DO  Relative Name and Phone Number:       Current Level of Care: Hospital Recommended Level of Care: [place] Prior Approval Number:    Date Approved/Denied:   PASRR Number: [id number]  Discharge Plan: SNF    Current Diagnoses: Patient Active Problem List   Diagnosis Date Noted  . Bronchitis, chronic with acute exacerbation ([place]) 04/15/2018  . Aortic atherosclerosis ([place]) 04/14/2018  . Right femoral fracture ([place]) 03/15/2018  . Anemia 03/15/2018  . Hyponatremia 03/15/2018  . Hyperglycemia 04/29/2017  . Obesity (BMI 30-39.9) 02/17/2017  . OAB (overactive bladder) 02/16/2017  . Age-related osteoporosis with current pathological fracture 04/23/2016  . Estrogen deficiency 04/23/2016  . Fracture of posterior thoracic vertebral body with routine healing 04/23/2016  . Diastolic dysfunction with acute on chronic heart failure ([place]) 02/17/2016  . Routine general medical examination at a health care facility 04/23/2015  . Hyperlipidemia with target LDL less than 130   . Hypertension 04/26/2013  . Rhinitis, chronic 01/16/2011  . Obstructive chronic bronchitis without exacerbation ([place]) 01/16/2011    Orientation RESPIRATION BLADDER Height & Weight     Self, Time, Place  O2(2L ) Continent Weight: 189 lb 9.5 oz (86 kg) Height:  5\' 4"  (162.6 cm)  BEHAVIORAL SYMPTOMS/MOOD NEUROLOGICAL BOWEL NUTRITION STATUS      Continent (heart healthy)  AMBULATORY STATUS COMMUNICATION OF NEEDS Skin   Extensive Assist(Per son patient uses a wheelchair or is in bed. ) Verbally  Normal                       Personal Care Assistance Level of Assistance  Bathing, Feeding, Dressing Bathing Assistance: Maximum assistance Feeding assistance: Independent Dressing Assistance: Maximum assistance     Functional Limitations Info  Sight, Hearing, Speech Sight Info: Adequate Hearing Info: Adequate Speech Info: Adequate    SPECIAL CARE FACTORS FREQUENCY  PT (By licensed PT)     PT Frequency: 5x/week              Contractures Contractures Info: Not present    Additional Factors Info  Code Status, Allergies Code Status Info: Full Code Allergies Info: NKA           Current Medications (04/15/2018):  This is the current hospital active medication list Current Facility-Administered Medications  Medication Dose Route Frequency Provider Last Rate Last Dose  . acetaminophen (TYLENOL) tablet 650 mg  650 mg Oral Q6H PRN Shela Leff, MD   650 mg at 04/15/18 0137  . aspirin chewable tablet 81 mg  81 mg Oral Daily Shela Leff, MD   81 mg at 04/15/18 1012  . calcium-vitamin D (OSCAL WITH D) 500-200 MG-UNIT per tablet 1 tablet  1 tablet Oral BID Shela Leff, MD   1 tablet at 04/15/18 1010  . dextromethorphan-guaiFENesin (MUCINEX DM) 30-600 MG per 12 hr tablet 1 tablet  1 tablet Oral BID Shela Leff, MD   1 tablet at 04/15/18 1012  . enoxaparin (LOVENOX) injection 40 mg  40 mg Subcutaneous Q24H Shela Leff, MD   40 mg at 04/14/18 2209  . ipratropium-albuterol (  DUONEB) 0.5-2.5 (3) MG/3ML nebulizer solution 3 mL  3 mL Nebulization Q6H Shela Leff, MD   3 mL at 04/15/18 0747  . losartan (COZAAR) tablet 100 mg  100 mg Oral Daily Shela Leff, MD   100 mg at 04/15/18 1012  . multivitamin with minerals tablet 1 tablet  1 tablet Oral Q1200 Shela Leff, MD      . predniSONE (DELTASONE) tablet 40 mg  40 mg Oral Daily Shela Leff, MD   40 mg at 04/15/18 1010     Discharge Medications: Please see discharge  summary for a list of discharge medications.  Relevant Imaging Results:  Relevant Lab Results:   Additional Information SSN [id number]  Ihor Gully, LCSW

## 2018-04-15 NOTE — Progress Notes (Signed)
PROGRESS NOTE    Carrie Crawford  DDU:[id number] DOB: 01-Sep-1925 DOA: 04/14/2018 PCP: Eustaquio Maize, MD   Brief Narrative:  HPI on 04/14/2018 by Dr. Garlan Fair is a 84 y.o. female with medical history significant of hypertension, hyperlipidemia, chronic bronchitis, CHF, recent right femur fracture with ORIF presenting to the hospital with a chief complaint of a cough productive of sputum.  Patient states she was diagnosed with pneumonia of few days ago and has been feeling weak since then.  She has been taking azithromycin without any improvement.  Dates her back hurts when she coughs.  Denies having any chest pain, fever, chills, or shortness of breath.  Reports having a low appetite.  Family at bedside confirm that patient has been taking Lovenox shots every day since she had her hip fracture; last dose was taken today.  Per chart review, patient was recently seen in the ED on April 09, 2018 and treated for pneumonia with IV Rocephin and oral azithromycin.  She was then seen at [facility] on April 14, 2018 for a follow-up found to have not improved since discharge.  Patient was found to be obtunded in their office, arousing to stimuli but immediately falling back asleep.  As well as noted to be confused.  As such, an ambulance was called and she was sent [facility].  Interim history  Admitted for acute on chronic bronchitis. Noted to be very weak. PT consulted and recommended SNF. Pending authorization.  Assessment & Plan   Acute exacerbation of chronic bronchitis/acute hypoxic respiratory failure -Patient noted to be hypoxemic with saturations in the 80s upon presentation to the ED.  She was recently treated for community-acquired pneumonia with azithromycin. -Chest x-ray -Unlikely to have pulmonary embolism as patient is currently on Lovenox injections status post right femur fracture -Continue nebulizer treatments, prednisone, Mucinex,  incentive spirometry and supplemental oxygen to maintain saturation above 92%  Hypokalemia -resolved, continue to monitor BMP  Chronic diastolic congestive heart failure -Patient appears to be euvolemic and compensated -Echocardiogram May 2019 shows an EF 60 to 23%, grade 1 diastolic dysfunction -Lasix currently held -Continue monitor intake and output, daily weights  Recent right femur fracture -s/p ORIF -currently lovenox  Deconditioning  -PT consulted and recommended SNF  Social issues -recently discharged from SNF, however, after speaking with several family members it seems that she needs more care than be offered at home -currently patient is weak and cannot stand on her own -she does live with her son (POA) -social work consulted and pending insurance auth   DVT Prophylaxis  lovenox  Code Status: Full  Family Communication: Daughter at bedside, son via phone  Disposition Plan: Admitted. Pending SNF placement  Consultants None  Procedures  None  Antibiotics   Anti-infectives (From admission, onward)   Start     Dose/Rate Route Frequency Ordered Stop   04/14/18 2200  doxycycline (VIBRA-TABS) tablet 100 mg  Status:  Discontinued     100 mg Oral Every 12 hours 04/14/18 2022 04/14/18 2359   04/14/18 2100  cefTRIAXone (ROCEPHIN) 1 g in sodium chloride 0.9 % 100 mL IVPB  Status:  Discontinued     1 g 200 mL/hr over 30 Minutes Intravenous Every 24 hours 04/14/18 2022 04/14/18 2359      Subjective:   Carrie Crawford seen and examined today. Patent with history of dementia (per daughter). Feels breathing is about the same. Currently denies chest pain, abdominal pain, N/V/D/C. Feels weak. States  she fell at home.  Objective:   Vitals:   04/15/18 0153 04/15/18 0540 04/15/18 0751 04/15/18 0754  BP:  139/77    Pulse:  93    Resp:  20    Temp:  98.6 F (37 C)    TempSrc:  Oral    SpO2: 93% 94% 93%   Weight:      Height:    5\' 4"  (1.626 m)    Intake/Output  Summary (Last 24 hours) at 04/15/2018 1415 Last data filed at 04/15/2018 0900 Gross per 24 hour  Intake 360 ml  Output -  Net 360 ml   Filed Weights   04/14/18 2017  Weight: 86 kg    Exam  General: Well developed, well nourished, NAD, appears stated age  HEENT: NCAT, mucous membranes moist.   Neck: Supple  Cardiovascular: S1 S2 auscultated, RRR, no murmur.  Respiratory: Diminished breath sounds. Occ cough, mild exp wheezing  Abdomen: Soft, nontender, nondistended, + bowel sounds  Extremities: warm dry without cyanosis clubbing. LE edema B/L   Neuro: AAOx3 (self, place, situation), nonfocal  Psych: Appropriate mood and affect   Data Reviewed: I have personally reviewed following labs and imaging studies  CBC: Recent Labs  Lab 04/09/18 1718 04/14/18 1530  WBC 4.3 7.4  NEUTROABS 2.2 4.5  HGB 12.0 12.4  HCT 35.5* 37.6  MCV 92.7 94.0  PLT 278 161   Basic Metabolic Panel: Recent Labs  Lab 04/09/18 1746 04/14/18 1530 04/15/18 0525  NA 129* 133* 135  K 3.5 3.3* 4.1  CL 96* 99 100  CO2 28 27 27   GLUCOSE 110* 117* 139*  BUN 12 12 12   CREATININE 0.65 0.61 0.57  CALCIUM 7.9* 8.2* 8.6*   GFR: Estimated Creatinine Clearance: 49.6 mL/min (by C-G formula based on SCr of 0.57 mg/dL). Liver Function Tests: Recent Labs  Lab 04/14/18 1530  AST 21  ALT 23  ALKPHOS 78  BILITOT 0.6  PROT 6.8  ALBUMIN 3.0*   No results for input(s): LIPASE, AMYLASE in the last 168 hours. No results for input(s): AMMONIA in the last 168 hours. Coagulation Profile: No results for input(s): INR, PROTIME in the last 168 hours. Cardiac Enzymes: Recent Labs  Lab 04/14/18 1530  TROPONINI <0.03   BNP (last 3 results) No results for input(s): PROBNP in the last 8760 hours. HbA1C: No results for input(s): HGBA1C in the last 72 hours. CBG: No results for input(s): GLUCAP in the last 168 hours. Lipid Profile: No results for input(s): CHOL, HDL, LDLCALC, TRIG, CHOLHDL, LDLDIRECT  in the last 72 hours. Thyroid Function Tests: No results for input(s): TSH, T4TOTAL, FREET4, T3FREE, THYROIDAB in the last 72 hours. Anemia Panel: No results for input(s): VITAMINB12, FOLATE, FERRITIN, TIBC, IRON, RETICCTPCT in the last 72 hours. Urine analysis:    Component Value Date/Time   COLORURINE AMBER (A) 04/14/2018 1514   APPEARANCEUR HAZY (A) 04/14/2018 1514   LABSPEC 1.016 04/14/2018 1514   PHURINE 6.0 04/14/2018 1514   GLUCOSEU NEGATIVE 04/14/2018 1514   GLUCOSEU NEGATIVE 04/07/2016 1525   HGBUR NEGATIVE 04/14/2018 1514   BILIRUBINUR NEGATIVE 04/14/2018 1514   BILIRUBINUR negative 04/07/2016 1408   KETONESUR NEGATIVE 04/14/2018 1514   PROTEINUR NEGATIVE 04/14/2018 1514   UROBILINOGEN 0.2 04/07/2016 1525   NITRITE NEGATIVE 04/14/2018 1514   LEUKOCYTESUR NEGATIVE 04/14/2018 1514   Sepsis Labs: @LABRCNTIP (procalcitonin:4,lacticidven:4)  )No results found for this or any previous visit (from the past 240 hour(s)).    Radiology Studies: Dg Chest 2 View  Result  Date: 04/14/2018 CLINICAL DATA:  Chest congestion, decreased mental status, hypersomnolence. Recently hospitalized for CHF, hyponatremia, and community-acquired pneumonia. Femur fracture on September 3rd 2019. EXAM: CHEST - 2 VIEW COMPARISON:  Chest x-ray of April 09, 2018 FINDINGS: The lungs are reasonably well inflated. The retrocardiac region on the left remains dense with obscuration of the hemidiaphragm. The right lung is clear. The cardiac silhouette is enlarged. The central pulmonary vascularity is mildly prominent. There is no definite pulmonary edema. There is calcification in the wall of the thoracic aorta. The bony thorax exhibits no acute abnormality. IMPRESSION: Increased density in the left retrocardiac region compatible with atelectasis or pneumonia. This has not changed greatly since the previous study. Cardiomegaly without pulmonary edema. Thoracic aortic atherosclerosis. Electronically Signed   By:  David  Martinique M.D.   On: 04/14/2018 16:23     Scheduled Meds: . aspirin  81 mg Oral Daily  . calcium-vitamin D  1 tablet Oral BID  . dextromethorphan-guaiFENesin  1 tablet Oral BID  . enoxaparin (LOVENOX) injection  40 mg Subcutaneous Q24H  . ipratropium-albuterol  3 mL Nebulization Q6H  . losartan  100 mg Oral Daily  . multivitamin with minerals  1 tablet Oral Q1200  . predniSONE  40 mg Oral Daily   Continuous Infusions:   LOS: 1 day   Time Spent in minutes   45 minutes (greater than 50% of time spent with patient face to face, as well as reviewing old records, and formulating a plan)   Winnie Barsky D.O. on 04/15/2018 at 2:15 PM  Between 7am to 7pm - Please see pager noted on [email or web address]  After 7pm go to [email or web address]  And look for the night coverage person covering for me after hours  [facility] Office  [phone]

## 2018-04-15 NOTE — Care Management Important Message (Signed)
Important Message  Patient Details  Name: Carrie Crawford MRN: [id number] Date of Birth: [birth year]   Medicare Important Message Given:  Yes    [name] 04/15/2018, 10:50 AM

## 2018-04-15 NOTE — Evaluation (Signed)
Physical Therapy Evaluation Patient Details Name: Carrie Crawford MRN: [id number] DOB: 12-18-1925 Today's Date: 04/15/2018   History of Present Illness  84 yo female with onset of AMS, congestion and CAP was admitted after recent admission for femoral fracture on 03/15/18 and subsequent trip to SNF.  Pt returned home and has not walked since then.  PMHx:  Cardiomegaly, thoracic aortic atherosclerosis, bronchitis, CHF, CABG, hypotension, angina,   Clinical Impression  Pt is quite confused and tired, so will work on her mobility at the level where she is.  Have a plan for SNF as she is not currently able to move well enough for a one person assist even with a mechanical lift.  Her son has been caregiver and is not availiable to talk with but daughter spoke with doctor and PT about pt's challenges.  Follow acutely until her dc for mobility and strengthening as tolerated.  Monitor O2 sats with and without O2 for confirmation of her sats.  93% on room air bedside today.    Follow Up Recommendations SNF    Equipment Recommendations  None recommended by PT    Recommendations for Other Services       Precautions / Restrictions Precautions Precautions: Fall(telemetry) Restrictions Weight Bearing Restrictions: No      Mobility  Bed Mobility Overal bed mobility: Needs Assistance Bed Mobility: Supine to Sit;Sit to Supine     Supine to sit: Total assist Sit to supine: Total assist   General bed mobility comments: pt is not following instructions to assist bed to sit and back  Transfers Overall transfer level: Needs assistance Equipment used: Rolling walker (2 wheeled);1 person hand held assist Transfers: Sit to/from Stand Sit to Stand: Total assist;From elevated surface         General transfer comment: could not get pt standing  Ambulation/Gait             General Gait Details: unable to walk  Stairs            Wheelchair Mobility    Modified Rankin (Stroke  Patients Only)       Balance Overall balance assessment: Needs assistance;History of Falls Sitting-balance support: Feet supported;Bilateral upper extremity supported Sitting balance-Leahy Scale: Poor                                       Pertinent Vitals/Pain Pain Assessment: Faces Faces Pain Scale: No hurt Pain Location: Sensitive on R foot    Home Living Family/patient expects to be discharged to:: Private residence Living Arrangements: Children Available Help at Discharge: Family;Available 24 hours/day Type of Home: House Home Access: Ramped entrance     Home Layout: One level Home Equipment: Walker - 2 wheels;Bedside commode;Wheelchair Administrator, sports (comment)(Hoyer lift)      Prior Function Level of Independence: Needs assistance   Gait / Transfers Assistance Needed: not ambulatory and uses mechanical lift  ADL's / Homemaking Assistance Needed: assisted for bathing and dressing, self fed   Comments: son is caregiver and uses depends rather than assisting to commode     Hand Dominance   Dominant Hand: Right    Extremity/Trunk Assessment   Upper Extremity Assessment Upper Extremity Assessment: Generalized weakness    Lower Extremity Assessment Lower Extremity Assessment: Generalized weakness    Cervical / Trunk Assessment Cervical / Trunk Assessment: Kyphotic  Communication   Communication: No difficulties(confused)  Cognition Arousal/Alertness: Lethargic Behavior During Therapy:  Flat affect;Agitated Overall Cognitive Status: History of cognitive impairments - at baseline                                 General Comments: pt has required assistance due to her congnition      General Comments      Exercises     Assessment/Plan    PT Assessment Patient needs continued PT services  PT Problem List Decreased strength;Decreased range of motion;Decreased activity tolerance;Decreased balance;Decreased mobility;Decreased  coordination;Decreased cognition;Decreased knowledge of use of DME;Decreased safety awareness;Cardiopulmonary status limiting activity;Obesity;Decreased skin integrity       PT Treatment Interventions DME instruction;Gait training;Functional mobility training;Therapeutic activities;Therapeutic exercise;Balance training;Neuromuscular re-education;Patient/family education    PT Goals (Current goals can be found in the Care Plan section)  Acute Rehab PT Goals Patient Stated Goal: none stated PT Goal Formulation: With family Time For Goal Achievement: 04/29/18 Potential to Achieve Goals: Fair    Frequency Min 2X/week   Barriers to discharge Decreased caregiver support home with one person and two are required for safety    Co-evaluation               AM-PAC PT "6 Clicks" Daily Activity  Outcome Measure Difficulty turning over in bed (including adjusting bedclothes, sheets and blankets)?: Unable Difficulty moving from lying on back to sitting on the side of the bed? : Unable Difficulty sitting down on and standing up from a chair with arms (e.g., wheelchair, bedside commode, etc,.)?: Unable Help needed moving to and from a bed to chair (including a wheelchair)?: A Lot Help needed walking in hospital room?: Total Help needed climbing 3-5 steps with a railing? : Total 6 Click Score: 7    End of Session Equipment Utilized During Treatment: Oxygen Activity Tolerance: Patient limited by fatigue;Patient limited by lethargy;Other (comment)(able to maintain 93% O2 sat off cannula) Patient left: in bed;with call bell/phone within reach;with nursing/sitter in room Nurse Communication: Mobility status;Other (comment)(reported to MD about pt eval) PT Visit Diagnosis: Muscle weakness (generalized) (M62.81);Difficulty in walking, not elsewhere classified (R26.2);Adult, failure to thrive (R62.7)    Time: 2778-2423 PT Time Calculation (min) (ACUTE ONLY): 28 min   Charges:   PT  Evaluation $PT Eval Moderate Complexity: 1 Mod PT Treatments $Therapeutic Activity: 8-22 mins       Ramond Dial 04/15/2018, 11:26 AM  11:28 AM, 04/15/18 Mee Hives, PT, MS Physical Therapist - [place] [phone] (Office)

## 2018-04-15 NOTE — Clinical Social Work Note (Signed)
Patient was at [facility] from 03/18/18-04/09/18 for rehab. She was discharged from therapy due to not participating.   Patient's son and POA, Carrie Crawford, stated that he has spoken with his sibling and they wanted patient referred to [facility] (fax [phone] attn: Clarita Crane). He indicated that his brother is a physician who works at the facility. He stated that since release from [facility], patient was uses a wheelchair or been in bed.  LCSW discussed that patient may be in insurance copay days.  LCSW also discussed that insurance may not authorize patient again since she just recently got out of SNF a few days ago.   LCSW faxed referral to [facility] at family's request.      Tawny Asal, Clydene Pugh, LCSW

## 2018-04-16 DIAGNOSIS — J9601 Acute respiratory failure with hypoxia: Secondary | ICD-10-CM

## 2018-04-16 LAB — BASIC METABOLIC PANEL
ANION GAP: 4 — AB (ref 5–15)
BUN: 12 mg/dL (ref 8–23)
CHLORIDE: 97 mmol/L — AB (ref 98–111)
CO2: 30 mmol/L (ref 22–32)
Calcium: 8.1 mg/dL — ABNORMAL LOW (ref 8.9–10.3)
Creatinine, Ser: 0.56 mg/dL (ref 0.44–1.00)
GFR calc non Af Amer: >60 mL/min (ref >60)
Glucose, Bld: 99 mg/dL (ref 70–99)
POTASSIUM: 3.3 mmol/L — AB (ref 3.5–5.1)
Sodium: 131 mmol/L — ABNORMAL LOW (ref 135–145)

## 2018-04-16 LAB — MAGNESIUM: Magnesium: 2.1 mg/dL (ref 1.7–2.4)

## 2018-04-16 MED ORDER — HYDRALAZINE HCL 20 MG/ML IJ SOLN
10.0000 mg | Freq: Four times a day (QID) | INTRAMUSCULAR | Status: DC | PRN
  Filled 2018-04-16: qty 1

## 2018-04-16 MED ORDER — POTASSIUM CHLORIDE CRYS ER 20 MEQ PO TBCR
40.0000 meq | EXTENDED_RELEASE_TABLET | Freq: Once | ORAL | Status: AC
  Administered 2018-04-16: 40 meq via ORAL
  Filled 2018-04-16: qty 2

## 2018-04-16 NOTE — Progress Notes (Signed)
PROGRESS NOTE    Carrie Crawford  HCW:[id number] DOB: April 09, 1926 DOA: 04/14/2018 PCP: Eustaquio Maize, MD   Brief Narrative:  HPI on 04/14/2018 by Dr. Garlan Fair is a 84 y.o. female with medical history significant of hypertension, hyperlipidemia, chronic bronchitis, CHF, recent right femur fracture with ORIF presenting to the hospital with a chief complaint of a cough productive of sputum.  Patient states she was diagnosed with pneumonia of few days ago and has been feeling weak since then.  She has been taking azithromycin without any improvement.  Dates her back hurts when she coughs.  Denies having any chest pain, fever, chills, or shortness of breath.  Reports having a low appetite.  Family at bedside confirm that patient has been taking Lovenox shots every day since she had her hip fracture; last dose was taken today.  Per chart review, patient was recently seen in the ED on April 09, 2018 and treated for pneumonia with IV Rocephin and oral azithromycin.  She was then seen at [facility] family medicine on April 14, 2018 for a follow-up found to have not improved since discharge.  Patient was found to be obtunded in their office, arousing to stimuli but immediately falling back asleep.  As well as noted to be confused.  As such, an ambulance was called and she was sent [facility].  Interim history  Admitted for acute on chronic bronchitis. Noted to be very weak. PT consulted and recommended SNF. Pending authorization.  Assessment & Plan   Acute exacerbation of chronic bronchitis/acute hypoxic respiratory failure -Patient noted to be hypoxemic with saturations in the 80s upon presentation to the ED.  She was recently treated for community-acquired pneumonia with azithromycin. -Chest x-ray reviewed, increased density in the left retrocardic region, ?atelectasis or pneumonia- not changed greatly since prior study.  -Unlikely to have pulmonary embolism as patient  is currently on Lovenox injections status post right femur fracture -Continue nebulizer treatments, prednisone, Mucinex, incentive spirometry and supplemental oxygen to maintain saturation above 92% -Currently on room air and maintaining oxygen saturations in the 90s  Hypokalemia -K 3.2 today, will replace and continue to monitor -will obtain magnesium level  Chronic diastolic congestive heart failure -Patient appears to be euvolemic and compensated -Echocardiogram May 2019 shows an EF 60 to 17%, grade 1 diastolic dysfunction -Lasix currently held -Continue monitor intake and output, daily weights  Recent right femur fracture -s/p ORIF -currently lovenox  Deconditioning  -PT consulted and recommended SNF  Social issues -recently discharged from SNF, however, after speaking with several family members it seems that she needs more care than be offered at home -currently patient is weak and cannot stand on her own -she does live with her son (POA) -social work consulted and pending insurance auth   DVT Prophylaxis  lovenox  Code Status: Full  Family Communication: Daughter at bedside  Disposition Plan: Admitted. Pending SNF placement. Feel patient cannot be taken care of at home.  Currently she is unable to feed herself or perform other ADLs.  Consultants None  Procedures  None  Antibiotics   Anti-infectives (From admission, onward)   Start     Dose/Rate Route Frequency Ordered Stop   04/14/18 2200  doxycycline (VIBRA-TABS) tablet 100 mg  Status:  Discontinued     100 mg Oral Every 12 hours 04/14/18 2022 04/14/18 2359   04/14/18 2100  cefTRIAXone (ROCEPHIN) 1 g in sodium chloride 0.9 % 100 mL IVPB  Status:  Discontinued  1 g 200 mL/hr over 30 Minutes Intravenous Every 24 hours 04/14/18 2022 04/14/18 2359      Subjective:   Carrie Crawford seen and examined today. Patent with history of dementia. Currently denies pain. Eating breakfast and states it is too much  food. Per daughter, she feels her mother has perked up some since admission and her cough is improving.  Objective:   Vitals:   04/15/18 1945 04/15/18 2117 04/16/18 0619 04/16/18 0800  BP:  (!) 160/70 (!) 172/71   Pulse:  98 89   Resp:  18 18   Temp:  99.7 F (37.6 C) 98.5 F (36.9 C)   TempSrc:  Oral Oral   SpO2: 91% 92% 91% 93%  Weight:      Height:        Intake/Output Summary (Last 24 hours) at 04/16/2018 1047 Last data filed at 04/16/2018 1941 Gross per 24 hour  Intake 360 ml  Output 500 ml  Net -140 ml   Filed Weights   04/14/18 2017  Weight: 86 kg   Exam  General: Well developed, well nourished, NAD, appears stated age  72: NCAT, mucous membranes moist.   Neck: Supple  Cardiovascular: S1 S2 auscultated, RRR, no murmur  Respiratory: Clear to auscultation bilaterally with equal chest rise  Abdomen: Soft, nontender, nondistended, + bowel sounds  Extremities: warm dry without cyanosis clubbing or edema  Neuro: AAOx2 (self, place), nonfocal  Psych: Appropriate mood and affect  Data Reviewed: I have personally reviewed following labs and imaging studies  CBC: Recent Labs  Lab 04/09/18 1718 04/14/18 1530  WBC 4.3 7.4  NEUTROABS 2.2 4.5  HGB 12.0 12.4  HCT 35.5* 37.6  MCV 92.7 94.0  PLT 278 740   Basic Metabolic Panel: Recent Labs  Lab 04/09/18 1746 04/14/18 1530 04/15/18 0525 04/16/18 0651  NA 129* 133* 135 131*  K 3.5 3.3* 4.1 3.3*  CL 96* 99 100 97*  CO2 28 27 27 30   GLUCOSE 110* 117* 139* 99  BUN 12 12 12 12   CREATININE 0.65 0.61 0.57 0.56  CALCIUM 7.9* 8.2* 8.6* 8.1*   GFR: Estimated Creatinine Clearance: 49.6 mL/min (by C-G formula based on SCr of 0.56 mg/dL). Liver Function Tests: Recent Labs  Lab 04/14/18 1530  AST 21  ALT 23  ALKPHOS 78  BILITOT 0.6  PROT 6.8  ALBUMIN 3.0*   No results for input(s): LIPASE, AMYLASE in the last 168 hours. No results for input(s): AMMONIA in the last 168 hours. Coagulation  Profile: No results for input(s): INR, PROTIME in the last 168 hours. Cardiac Enzymes: Recent Labs  Lab 04/14/18 1530  TROPONINI <0.03   BNP (last 3 results) No results for input(s): PROBNP in the last 8760 hours. HbA1C: No results for input(s): HGBA1C in the last 72 hours. CBG: No results for input(s): GLUCAP in the last 168 hours. Lipid Profile: No results for input(s): CHOL, HDL, LDLCALC, TRIG, CHOLHDL, LDLDIRECT in the last 72 hours. Thyroid Function Tests: No results for input(s): TSH, T4TOTAL, FREET4, T3FREE, THYROIDAB in the last 72 hours. Anemia Panel: No results for input(s): VITAMINB12, FOLATE, FERRITIN, TIBC, IRON, RETICCTPCT in the last 72 hours. Urine analysis:    Component Value Date/Time   COLORURINE AMBER (A) 04/14/2018 1514   APPEARANCEUR HAZY (A) 04/14/2018 1514   LABSPEC 1.016 04/14/2018 1514   PHURINE 6.0 04/14/2018 1514   GLUCOSEU NEGATIVE 04/14/2018 1514   GLUCOSEU NEGATIVE 04/07/2016 1525   HGBUR NEGATIVE 04/14/2018 1514   BILIRUBINUR NEGATIVE 04/14/2018 1514  BILIRUBINUR negative 04/07/2016 1408   KETONESUR NEGATIVE 04/14/2018 1514   PROTEINUR NEGATIVE 04/14/2018 1514   UROBILINOGEN 0.2 04/07/2016 1525   NITRITE NEGATIVE 04/14/2018 1514   LEUKOCYTESUR NEGATIVE 04/14/2018 1514   Sepsis Labs: @LABRCNTIP (procalcitonin:4,lacticidven:4)  )No results found for this or any previous visit (from the past 240 hour(s)).    Radiology Studies: Dg Chest 2 View  Result Date: 04/14/2018 CLINICAL DATA:  Chest congestion, decreased mental status, hypersomnolence. Recently hospitalized for CHF, hyponatremia, and community-acquired pneumonia. Femur fracture on September 3rd 2019. EXAM: CHEST - 2 VIEW COMPARISON:  Chest x-ray of April 09, 2018 FINDINGS: The lungs are reasonably well inflated. The retrocardiac region on the left remains dense with obscuration of the hemidiaphragm. The right lung is clear. The cardiac silhouette is enlarged. The central pulmonary  vascularity is mildly prominent. There is no definite pulmonary edema. There is calcification in the wall of the thoracic aorta. The bony thorax exhibits no acute abnormality. IMPRESSION: Increased density in the left retrocardiac region compatible with atelectasis or pneumonia. This has not changed greatly since the previous study. Cardiomegaly without pulmonary edema. Thoracic aortic atherosclerosis. Electronically Signed   By: David  Martinique M.D.   On: 04/14/2018 16:23     Scheduled Meds: . aspirin  81 mg Oral Daily  . calcium-vitamin D  1 tablet Oral BID  . dextromethorphan-guaiFENesin  1 tablet Oral BID  . enoxaparin (LOVENOX) injection  40 mg Subcutaneous Q24H  . ipratropium-albuterol  3 mL Nebulization TID  . losartan  100 mg Oral Daily  . multivitamin with minerals  1 tablet Oral Q1200  . predniSONE  40 mg Oral Daily   Continuous Infusions:   LOS: 2 days   Time Spent in minutes   30 minutes  Avalene Sealy D.O. on 04/16/2018 at 10:47 AM  Between 7am to 7pm - Please see pager noted on [email or web address]  After 7pm go to [email or web address]  And look for the night coverage person covering for me after hours  [facility] Office  [phone]

## 2018-04-17 LAB — BASIC METABOLIC PANEL
ANION GAP: 7 (ref 5–15)
BUN: 16 mg/dL (ref 8–23)
CALCIUM: 8.1 mg/dL — AB (ref 8.9–10.3)
CO2: 26 mmol/L (ref 22–32)
Chloride: 97 mmol/L — ABNORMAL LOW (ref 98–111)
Creatinine, Ser: 0.62 mg/dL (ref 0.44–1.00)
Glucose, Bld: 100 mg/dL — ABNORMAL HIGH (ref 70–99)
Potassium: 3.2 mmol/L — ABNORMAL LOW (ref 3.5–5.1)
SODIUM: 130 mmol/L — AB (ref 135–145)

## 2018-04-17 MED ORDER — POTASSIUM CHLORIDE CRYS ER 20 MEQ PO TBCR: 40.0000 meq | EXTENDED_RELEASE_TABLET | ORAL | Status: DC

## 2018-04-17 MED ORDER — POTASSIUM CHLORIDE CRYS ER 20 MEQ PO TBCR
40.0000 meq | EXTENDED_RELEASE_TABLET | ORAL | Status: AC
  Administered 2018-04-17: 40 meq via ORAL
  Filled 2018-04-17: qty 2

## 2018-04-17 MED ORDER — HYDRALAZINE HCL 20 MG/ML IJ SOLN
10.0000 mg | Freq: Four times a day (QID) | INTRAMUSCULAR | Status: DC | PRN
  Administered 2018-04-17: 10 mg via INTRAVENOUS
  Filled 2018-04-17: qty 1

## 2018-04-17 MED ORDER — POTASSIUM CHLORIDE CRYS ER 20 MEQ PO TBCR
40.0000 meq | EXTENDED_RELEASE_TABLET | Freq: Once | ORAL | Status: AC
  Administered 2018-04-17: 40 meq via ORAL

## 2018-04-17 NOTE — Progress Notes (Signed)
PROGRESS NOTE    Carrie Crawford  PYP:[id number] DOB: 06/27/1926 DOA: 04/14/2018 PCP: Eustaquio Maize, MD   Brief Narrative:  HPI on 04/14/2018 by Dr. Garlan Fair is a 84 y.o. female with medical history significant of hypertension, hyperlipidemia, chronic bronchitis, CHF, recent right femur fracture with ORIF presenting to the hospital with a chief complaint of a cough productive of sputum.  Patient states she was diagnosed with pneumonia of few days ago and has been feeling weak since then.  She has been taking azithromycin without any improvement.  Dates her back hurts when she coughs.  Denies having any chest pain, fever, chills, or shortness of breath.  Reports having a low appetite.  Family at bedside confirm that patient has been taking Lovenox shots every day since she had her hip fracture; last dose was taken today.  Per chart review, patient was recently seen in the ED on April 09, 2018 and treated for pneumonia with IV Rocephin and oral azithromycin.  She was then seen at [facility] family medicine on April 14, 2018 for a follow-up found to have not improved since discharge.  Patient was found to be obtunded in their office, arousing to stimuli but immediately falling back asleep.  As well as noted to be confused.  As such, an ambulance was called and she was sent [facility].  Interim history  Admitted for acute on chronic bronchitis. Noted to be very weak. PT consulted and recommended SNF. Pending authorization.  Assessment & Plan   Acute exacerbation of chronic bronchitis/acute hypoxic respiratory failure -Patient noted to be hypoxemic with saturations in the 80s upon presentation to the ED.  She was recently treated for community-acquired pneumonia with azithromycin. -Chest x-ray reviewed, increased density in the left retrocardic region, ?atelectasis or pneumonia- not changed greatly since prior study.  -Unlikely to have pulmonary embolism as patient  is currently on Lovenox injections status post right femur fracture -Continue nebulizer treatments, prednisone, Mucinex, incentive spirometry and supplemental oxygen to maintain saturation above 92% -Currently on room air and maintaining oxygen saturations in the 90s  Hypokalemia -K 3.2 today despite replacement -magnesium 2.1 (WNL) -will continue to replace potassium and monitor BMP  Chronic diastolic congestive heart failure -Patient appears to be euvolemic and compensated -Echocardiogram May 2019 shows an EF 60 to 24%, grade 1 diastolic dysfunction -Lasix currently held -Continue monitor intake and output, daily weights  Recent right femur fracture -s/p ORIF -currently lovenox  Deconditioning  -PT consulted and recommended SNF  Social issues -recently discharged from SNF, however, after speaking with several family members it seems that she needs more care than be offered at home -currently patient is weak and cannot stand on her own -she does live with her son (POA) -social work consulted and pending insurance auth   Goals of care -discussed with patient's son, patient's POA (other son) feels she should be full code   DVT Prophylaxis  lovenox  Code Status: Full  Family Communication: None at bedside  Disposition Plan: Admitted. Pending SNF placement. Feel patient cannot be taken care of at home.  Currently she is unable to perform other ADLs.  Consultants None  Procedures  None  Antibiotics   Anti-infectives (From admission, onward)   Start     Dose/Rate Route Frequency Ordered Stop   04/14/18 2200  doxycycline (VIBRA-TABS) tablet 100 mg  Status:  Discontinued     100 mg Oral Every 12 hours 04/14/18 2022 04/14/18 2359   04/14/18  2100  cefTRIAXone (ROCEPHIN) 1 g in sodium chloride 0.9 % 100 mL IVPB  Status:  Discontinued     1 g 200 mL/hr over 30 Minutes Intravenous Every 24 hours 04/14/18 2022 04/14/18 2359      Subjective:   Carrie Crawford seen and  examined today. Patent with history of dementia. Eating breakfast and feels it is too much food. She denies current chest pain, shortness of breath, abdominal pain.  Objective:   Vitals:   04/16/18 2200 04/16/18 2309 04/17/18 0615 04/17/18 0727  BP: (!) 170/88 (!) 154/85 (!) 167/86   Pulse: 97 95 96   Resp:   18   Temp:   98.2 F (36.8 C)   TempSrc:   Oral   SpO2: 94% 90% 91% 94%  Weight:      Height:        Intake/Output Summary (Last 24 hours) at 04/17/2018 1028 Last data filed at 04/17/2018 0800 Gross per 24 hour  Intake 720 ml  Output 1875 ml  Net -1155 ml   Filed Weights   04/14/18 2017  Weight: 86 kg   Exam  General: Well developed, well nourished, NAD, appears stated age  67: NCAT, mucous membranes moist.   Neck: Supple  Cardiovascular: S1 S2 auscultated, RRR, no murmur  Respiratory: Clear to auscultation bilaterally with equal chest rise  Abdomen: Soft, nontender, nondistended, + bowel sounds  Extremities: warm dry without cyanosis clubbing or edema  Neuro: AAOx2 (self and place), nonfocal  Psych: Appropriate mood and affect, pleasant   Data Reviewed: I have personally reviewed following labs and imaging studies  CBC: Recent Labs  Lab 04/14/18 1530  WBC 7.4  NEUTROABS 4.5  HGB 12.4  HCT 37.6  MCV 94.0  PLT 013   Basic Metabolic Panel: Recent Labs  Lab 04/14/18 1530 04/15/18 0525 04/16/18 0651 04/17/18 0613  NA 133* 135 131* 130*  K 3.3* 4.1 3.3* 3.2*  CL 99 100 97* 97*  CO2 27 27 30 26   GLUCOSE 117* 139* 99 100*  BUN 12 12 12 16   CREATININE 0.61 0.57 0.56 0.62  CALCIUM 8.2* 8.6* 8.1* 8.1*  MG  --   --  2.1  --    GFR: Estimated Creatinine Clearance: 49.6 mL/min (by C-G formula based on SCr of 0.62 mg/dL). Liver Function Tests: Recent Labs  Lab 04/14/18 1530  AST 21  ALT 23  ALKPHOS 78  BILITOT 0.6  PROT 6.8  ALBUMIN 3.0*   No results for input(s): LIPASE, AMYLASE in the last 168 hours. No results for input(s):  AMMONIA in the last 168 hours. Coagulation Profile: No results for input(s): INR, PROTIME in the last 168 hours. Cardiac Enzymes: Recent Labs  Lab 04/14/18 1530  TROPONINI <0.03   BNP (last 3 results) No results for input(s): PROBNP in the last 8760 hours. HbA1C: No results for input(s): HGBA1C in the last 72 hours. CBG: No results for input(s): GLUCAP in the last 168 hours. Lipid Profile: No results for input(s): CHOL, HDL, LDLCALC, TRIG, CHOLHDL, LDLDIRECT in the last 72 hours. Thyroid Function Tests: No results for input(s): TSH, T4TOTAL, FREET4, T3FREE, THYROIDAB in the last 72 hours. Anemia Panel: No results for input(s): VITAMINB12, FOLATE, FERRITIN, TIBC, IRON, RETICCTPCT in the last 72 hours. Urine analysis:    Component Value Date/Time   COLORURINE AMBER (A) 04/14/2018 1514   APPEARANCEUR HAZY (A) 04/14/2018 1514   LABSPEC 1.016 04/14/2018 1514   PHURINE 6.0 04/14/2018 1514   GLUCOSEU NEGATIVE 04/14/2018 1514  GLUCOSEU NEGATIVE 04/07/2016 [place] 04/14/2018 [place] 04/14/2018 1514   BILIRUBINUR negative 04/07/2016 1408   KETONESUR NEGATIVE 04/14/2018 1514   PROTEINUR NEGATIVE 04/14/2018 1514   UROBILINOGEN 0.2 04/07/2016 1525   NITRITE NEGATIVE 04/14/2018 1514   LEUKOCYTESUR NEGATIVE 04/14/2018 1514   Sepsis Labs: @LABRCNTIP (procalcitonin:4,lacticidven:4)  )No results found for this or any previous visit (from the past 240 hour(s)).    Radiology Studies: No results found.   Scheduled Meds: . aspirin  81 mg Oral Daily  . calcium-vitamin D  1 tablet Oral BID  . dextromethorphan-guaiFENesin  1 tablet Oral BID  . enoxaparin (LOVENOX) injection  40 mg Subcutaneous Q24H  . ipratropium-albuterol  3 mL Nebulization TID  . losartan  100 mg Oral Daily  . multivitamin with minerals  1 tablet Oral Q1200  . predniSONE  40 mg Oral Daily   Continuous Infusions:   LOS: 3 days   Time Spent in minutes   30 minutes  Kerissa Coia D.O. on 04/17/2018 at 10:28 AM  Between 7am to 7pm - Please see pager noted on [email or web address]  After 7pm go to [email or web address]  And look for the night coverage person covering for me after hours  [facility] Office  [phone]

## 2018-04-18 DIAGNOSIS — R5381 Other malaise: Secondary | ICD-10-CM | POA: Diagnosis not present

## 2018-04-18 DIAGNOSIS — R799 Abnormal finding of blood chemistry, unspecified: Secondary | ICD-10-CM | POA: Diagnosis not present

## 2018-04-18 DIAGNOSIS — R404 Transient alteration of awareness: Secondary | ICD-10-CM | POA: Diagnosis not present

## 2018-04-18 DIAGNOSIS — E871 Hypo-osmolality and hyponatremia: Secondary | ICD-10-CM | POA: Diagnosis not present

## 2018-04-18 DIAGNOSIS — R410 Disorientation, unspecified: Secondary | ICD-10-CM | POA: Diagnosis not present

## 2018-04-18 DIAGNOSIS — J181 Lobar pneumonia, unspecified organism: Secondary | ICD-10-CM | POA: Diagnosis not present

## 2018-04-18 DIAGNOSIS — J42 Unspecified chronic bronchitis: Secondary | ICD-10-CM | POA: Diagnosis not present

## 2018-04-18 DIAGNOSIS — J449 Chronic obstructive pulmonary disease, unspecified: Secondary | ICD-10-CM | POA: Diagnosis not present

## 2018-04-18 DIAGNOSIS — I5030 Unspecified diastolic (congestive) heart failure: Secondary | ICD-10-CM | POA: Diagnosis not present

## 2018-04-18 DIAGNOSIS — S72301D Unspecified fracture of shaft of right femur, subsequent encounter for closed fracture with routine healing: Secondary | ICD-10-CM | POA: Diagnosis not present

## 2018-04-18 DIAGNOSIS — R2681 Unsteadiness on feet: Secondary | ICD-10-CM | POA: Diagnosis not present

## 2018-04-18 DIAGNOSIS — Z7401 Bed confinement status: Secondary | ICD-10-CM | POA: Diagnosis not present

## 2018-04-18 DIAGNOSIS — Z5189 Encounter for other specified aftercare: Secondary | ICD-10-CM | POA: Diagnosis not present

## 2018-04-18 DIAGNOSIS — I10 Essential (primary) hypertension: Secondary | ICD-10-CM | POA: Diagnosis not present

## 2018-04-18 DIAGNOSIS — Z6839 Body mass index (BMI) 39.0-39.9, adult: Secondary | ICD-10-CM | POA: Diagnosis not present

## 2018-04-18 DIAGNOSIS — I5032 Chronic diastolic (congestive) heart failure: Secondary | ICD-10-CM | POA: Diagnosis not present

## 2018-04-18 DIAGNOSIS — M6281 Muscle weakness (generalized): Secondary | ICD-10-CM | POA: Diagnosis not present

## 2018-04-18 DIAGNOSIS — J9601 Acute respiratory failure with hypoxia: Secondary | ICD-10-CM | POA: Diagnosis not present

## 2018-04-18 DIAGNOSIS — E876 Hypokalemia: Secondary | ICD-10-CM | POA: Diagnosis not present

## 2018-04-18 DIAGNOSIS — J209 Acute bronchitis, unspecified: Secondary | ICD-10-CM | POA: Diagnosis not present

## 2018-04-18 DIAGNOSIS — I959 Hypotension, unspecified: Secondary | ICD-10-CM | POA: Diagnosis not present

## 2018-04-18 DIAGNOSIS — R0902 Hypoxemia: Secondary | ICD-10-CM | POA: Diagnosis not present

## 2018-04-18 DIAGNOSIS — R278 Other lack of coordination: Secondary | ICD-10-CM | POA: Diagnosis not present

## 2018-04-18 DIAGNOSIS — R293 Abnormal posture: Secondary | ICD-10-CM | POA: Diagnosis not present

## 2018-04-18 LAB — BASIC METABOLIC PANEL
ANION GAP: 5 (ref 5–15)
BUN: 14 mg/dL (ref 8–23)
CALCIUM: 8.1 mg/dL — AB (ref 8.9–10.3)
CO2: 26 mmol/L (ref 22–32)
Chloride: 100 mmol/L (ref 98–111)
Creatinine, Ser: 0.52 mg/dL (ref 0.44–1.00)
Glucose, Bld: 101 mg/dL — ABNORMAL HIGH (ref 70–99)
Potassium: 3.7 mmol/L (ref 3.5–5.1)
SODIUM: 131 mmol/L — AB (ref 135–145)

## 2018-04-18 MED ORDER — PREDNISONE 10 MG PO TABS: ORAL_TABLET | ORAL | Status: DC

## 2018-04-18 MED ORDER — IPRATROPIUM-ALBUTEROL 0.5-2.5 (3) MG/3ML IN SOLN: 3.0000 mL | RESPIRATORY_TRACT | Status: DC | PRN

## 2018-04-18 MED ORDER — DM-GUAIFENESIN ER 30-600 MG PO TB12: 1.0000 | ORAL_TABLET | Freq: Two times a day (BID) | ORAL | Status: DC

## 2018-04-18 NOTE — Progress Notes (Signed)
Called report to Vernelle Emerald, receiving nurse at [facility]. Facility is aware patient will be transported by her daughter and she anticipates about a 2 hour drive. Patient's son [name] and daughter present and agreeable to discharge plan. Pt in stable condition awaiting discharge. Donavan Foil, RN

## 2018-04-18 NOTE — Progress Notes (Signed)
Nursing attempted to get patient dressed for discharge. Pt unable to stand with 4 person assist to get dressed. Assisted back to bed, bedalarm on for safety. Discussed safe transport with patient's son [name] and daughter Zigmund Daniel at bedside. Stated they agree to EMS transport since she is unable to stand and safely get into daughter's vehicle. Notified Ambrose Pancoast, CSW of need for EMS transport. Discussed with Jeneen Rinks from PT who agrees with EMS transport from PT stand. Pt in stable condition awaiting discharge to SNF. Donavan Foil, RN

## 2018-04-18 NOTE — Clinical Social Work Placement (Signed)
   CLINICAL SOCIAL WORK PLACEMENT  NOTE  Date:  04/18/2018  Patient Details  Name: Carrie Crawford MRN: [id number] Date of Birth: October 17, 1925  Clinical Social Work is seeking post-discharge placement for this patient at the [place] level of care (*CSW will initial, date and re-position this form in  chart as items are completed):  Yes   Patient/family provided with [place] Work Department's list of facilities offering this level of care within the geographic area requested by the patient (or if unable, by the patient's family).  Yes   Patient/family informed of their freedom to choose among providers that offer the needed level of care, that participate in Medicare, Medicaid or managed care program needed by the patient, have an available bed and are willing to accept the patient.  Yes   Patient/family informed of [facility]'s ownership interest in [facility] and [facility], as well as of the fact that they are under no obligation to receive care at these facilities.  PASRR submitted to EDS on 04/15/18     PASRR number received on 04/15/18     Existing PASRR number confirmed on       FL2 transmitted to all facilities in geographic area requested by pt/family on 04/15/18     FL2 transmitted to all facilities within larger geographic area on       Patient informed that his/her managed care company has contracts with or will negotiate with certain facilities, including the following:        Yes   Patient/family informed of bed offers received.  Patient chooses bed at Other - please specify in the comment section below:([facility] )     Physician recommends and patient chooses bed at      Patient to be transferred to Other - please specify in the comment section below:([facility] ) on 04/18/18.  Patient to be transferred to facility by Daughter     Patient family notified on 04/18/18 of transfer.  Name of family  member notified:  [name], son     PHYSICIAN       Additional Comment:  Discharge clinicals sent to facility. LCSW signing off.   _______________________________________________ Ihor Gully, LCSW 04/18/2018, 12:35 PM

## 2018-04-18 NOTE — Care Management Important Message (Signed)
Important Message  Patient Details  Name: GENETTA FIERO MRN: [id number] Date of Birth: [birth year]   Medicare Important Message Given:  Yes    [name] 04/18/2018, 11:35 AM

## 2018-04-18 NOTE — Progress Notes (Signed)
Patient's son [name] and daugther stated this morning they would be planning on private pay at [facility] since insurance authorization has not yet been received. They state daughter will transport her to facility. Discussed with Ambrose Pancoast, CSW. She spoke with patient's son [name] to confirm plans. Donavan Foil, RN

## 2018-04-18 NOTE — Progress Notes (Signed)
EMS transport arranged by CSW. Patient picked up by EMS transport just now. Transferred to stretcher via EMS and nursing staff assistance. Son [name] and Daughter Zigmund Daniel at bedside at time of EMS arrival and agreeable to discharge plan. Patient left floor in stable condition via EMS transport. Nursing notified [facility] of patient in route. Nursing center personnel stated she would notify the receiving nurse that she is on the way. Donavan Foil, RN

## 2018-04-18 NOTE — Clinical Social Work Note (Signed)
Patient Information   Patient Name Carrie Crawford, Carrie Crawford ([id number]) Sex Female DOB 1926-06-10 SSN [id number]  Room Bed  A323 A323-01  Patient Demographics   Address [place] [address] Phone [phone] (Home) [phone] (Mobile) *Preferred* E-mail Address [email or web address]  Patient Ethnicity & Race   Ethnic Group Patient Race  Not Hispanic or Latino White or Caucasian  Emergency Contact(s)   Name Relation Home Work Mobile  [place] Son   [phone]  Jennelle, [place] Daughter   [phone]  Documents on File    Status Date Received Description  Documents for the Patient  [place] Not Received    [facility] E-Signature HIPAA Notice of Privacy Received 01/16/11   [place] E-Signature HIPAA Notice of Privacy Spanish Not Received    Driver's License Not Received    Insurance Card Received 01/16/11   Advance Directives/Living Will/HCPOA/POA Not Received    Financial Application Not Received    Insurance Card Received 12/02/15   Release of Information Not Received    Advanced Beneficiary Notice (ABN) Not Received    Driver's License Received 31/59/45 [facility]/JB  Insurance Card Received 01/18/13 BLUE MEDICARE 2017  Insurance Card Received 01/18/13 WR[facility]/BLUE MEDICARE/JB  Release of Information Not Received    Insurance Card Received 09/10/17 [email or web address].[email or web address]  Insurance Card Received 07/17/13 [facility]/BLUEMCR  Insurance Card Received 07/31/13 Blue Medicare 2015  HIM ROI Authorization (Expired) 08/23/13   HIM ROI Authorization  11/23/13 ROI  Other Photo ID Not Received    Insurance Card Received 12/02/15 BCBS medicare/lbpulm 2017  AMB Provider Completed Forms  05/06/16 CMN  AMB HH/NH/Hospice  85/92/92 CERTIFICATION/POC [facility]  AMB HH/NH/Hospice  44/62/86 CERTIFICATION/POC [facility]  AMB Provider Completed Forms  02/18/17 WHEELCHAIR ORDER FORM [facility]  E-Signature AOB Spanish Not Received     Insurance Card Received 09/10/17 [email or web address]  AMB HH/NH/Hospice Received 09/10/17 [facility]  Release of Information Received 09/13/17 DPR [facility]  AMB HH/NH/Hospice Received 38/17/71 [place]  Documents for the Encounter  AOB (Assignment of Insurance Benefits) Not Received    E-signature AOB Signed 04/14/18   MEDICARE RIGHTS Not Received    E-signature Medicare Rights Signed 04/14/18   ED Patient Billing Extract   ED PB Billing Extract  Cardiac Monitoring Strip Shift Summary Received 04/14/18   EMS Run Sheet Received 04/15/18   EKG Received 04/15/18   Admission Information   Attending Provider Admitting Provider Admission Type Admission Date/Time  Cristal Ford, DO Shela Leff, MD Emergency 04/14/18 1454  Discharge Date Hospital Service Auth/Cert Status Service Area   Internal Medicine Incomplete [facility]  Unit Room/Bed Admission Status   AP-DEPT 300 A323/A323-01 Admission (Confirmed)   Admission   Complaint  shortness of breath  Hospital Account   Name Acct ID Class Status Primary Coverage  Carrie Crawford, Carrie Crawford [id number] Inpatient Open [place] HMO      Guarantor Account (for Hospital Account [id number])   Name Relation to Pt Service Area Active? Acct Type  Danella Maiers Self CHSA Yes Personal/Family  Address Phone    [address] [place], [place] [address] [phone])        Coverage Information (for Hospital Account [id number])   F/O Payor/Plan Precert #  [facility] HMO   Subscriber Subscriber #  [place], Randhawa [id number]  Address Phone  [address] [place] [address] [phone]

## 2018-04-18 NOTE — Discharge Summary (Signed)
Physician Discharge Summary  Carrie Crawford DOB: 03-Nov-1925 DOA: 04/14/2018  PCP: Carrie Maize, MD  Admit date: 04/14/2018 Discharge date: 04/18/2018  Time spent: 45 minutes  Recommendations for Outpatient Follow-up:  Patient will be discharged to [facility] facility, continue physical and occupational therapy.  Patient will need to follow up with primary care provider within one week of discharge, repeat BMP.  Patient should continue medications as prescribed.  Patient should follow a heart healthy diet.   Discharge Diagnoses:  Acute exacerbation of chronic bronchitis/acute hypoxic respiratory failure Hypokalemia Chronic diastolic congestive heart failure Recent right femur fracture Deconditioning  Social issues  Discharge Condition: Stable  Diet recommendation: heart healthy  Filed Weights   04/14/18 2017  Weight: 86 kg    History of present illness:  on 04/14/2018 by Dr. Myrtie Neither Crawford a 85 y.o.femalewith medical history significant ofhypertension, hyperlipidemia, chronic bronchitis, CHF, recent right femur fracture with ORIFpresenting to the hospital with a chief complaint of a cough productive of sputum. Patient states she was diagnosed with pneumonia of few days ago and has been feeling weak since then. She has been taking azithromycin without any improvement. Dates her back hurts when she coughs. Denies having any chest pain, fever, chills, or shortness of breath. Reports having a low appetite. Family at bedside confirm that patient has been taking Lovenox shots every day since she had her hip fracture;last dose was taken today.  Per chart review, patient was recently seen in the ED on April 09, 2018 and treated for pneumonia with IV Rocephin and oral azithromycin. She was then seen at [facility] on April 14, 2018 for a follow-up found to have not improved since discharge. Patient was found  to be obtunded in their office, arousing to stimuli but immediately falling back asleep. As well as noted to be confused. As such, an ambulance was called and she was sent [facility].  Hospital Course:  Acute exacerbation of chronic bronchitis/acute hypoxic respiratory failure -Patient noted to be hypoxemic with saturations in the 80s upon presentation to the ED.  She was recently treated for community-acquired pneumonia with azithromycin. -Chest x-ray reviewed, increased density in the left retrocardic region, ?atelectasis or pneumonia- not changed greatly since prior study.  -Unlikely to have pulmonary embolism as patient is currently on Lovenox injections status post right femur fracture -was placed on nebulizer treatments, prednisone, Mucinex, incentive spirometry and supplemental oxygen to maintain saturation above 92% -Currently on room air and maintaining oxygen saturations in the 90s -Continue prednisone taper on discharge, mucinex, nebs PRN  Hypokalemia -K 3.7 today despite replacement -magnesium 2.1 (WNL) -Repeat BMP  Chronic diastolic congestive heart failure -Patient appears to be euvolemic and compensated -Echocardiogram May 2019 shows an EF 60 to 21%, grade 1 diastolic dysfunction -Lasix currently held -Continue monitor intake and output, daily weights  Recent right femur fracture -s/p ORIF -currently lovenox  Deconditioning  -PT consulted and recommended SNF  Social issues -recently discharged from SNF, however, after speaking with several family members it seems that she needs more care than be offered at home -currently patient is weak and cannot stand on her own -she does live with her son (POA) -social work consulted and pending Clinical biochemist None  Procedures None  Discharge Exam: Vitals:   04/18/18 0552 04/18/18 0740  BP: (!) 152/78   Pulse: 99   Resp: 18   Temp: 98 F (36.7 C)   SpO2: 93% 93%  Has no complaints today. Denies  chest pain, shortness of breath, abdominal pain, nausea, vomiting, diarrhea, constipation, dizziness, headache. Continues to have cough.   General: Well developed, well nourished, NAD, appears stated age  76: NCAT, mucous membranes moist.  Neck: Supple  Cardiovascular: S1 S2 auscultated, RRR, no murmurs  Respiratory: Diminished however Clear to auscultation bilaterally, no wheezing, cough  Abdomen: Soft, nontender, nondistended, + bowel sounds  Extremities: warm dry without cyanosis clubbing or edema  Neuro: AAOx2 (person, place, not time), nonfocal, some dementia  Psych: Pleasant, appropriate mood and affect  Discharge Instructions Discharge Instructions    Discharge instructions   Complete by:  As directed    Patient will be discharged to [facility] facility, continue physical and occupational therapy.  Patient will need to follow up with primary care provider within one week of discharge, repeat BMP.  Patient should continue medications as prescribed.  Patient should follow a heart healthy diet.     Allergies as of 04/18/2018   No Known Allergies     Medication List    STOP taking these medications   azithromycin 250 MG tablet Commonly known as:  ZITHROMAX     TAKE these medications   acetaminophen 325 MG tablet Commonly known as:  TYLENOL Take 650 mg by mouth every 6 (six) hours as needed.   aspirin 81 MG tablet Take 81 mg by mouth daily.   azelastine 0.1 % nasal spray Commonly known as:  ASTELIN 2 sprays in each nostril at bedtime.   Calcium Carbonate-Vitamin D 600-400 MG-UNIT tablet Take 1 tablet by mouth 2 (two) times daily.   dextromethorphan-guaiFENesin 30-600 MG 12hr tablet Commonly known as:  MUCINEX DM Take 1 tablet by mouth 2 (two) times daily.   enoxaparin 40 MG/0.4ML injection Commonly known as:  LOVENOX Inject 0.4 mLs (40 mg total) into the skin daily.   fluticasone 50 MCG/ACT nasal spray Commonly known as:  FLONASE 2 SPRAYS IN  EACH NOSTRIL QD   fluticasone furoate-vilanterol 100-25 MCG/INH Aepb Commonly known as:  BREO ELLIPTA INHALE 1 PUFF ONCE DAILY AS DIRECTED   furosemide 20 MG tablet Commonly known as:  LASIX Take 1 tablet (20 mg total) by mouth every Monday, Wednesday, and Friday.   ipratropium-albuterol 0.5-2.5 (3) MG/3ML Soln Commonly known as:  DUONEB Take 3 mLs by nebulization every 4 (four) hours as needed (shortness of breath or wheezing).   losartan 100 MG tablet Commonly known as:  COZAAR Take 1 tablet (100 mg total) by mouth daily.   multivitamin with minerals tablet Take 1 tablet by mouth daily.   potassium chloride SA 20 MEQ tablet Commonly known as:  K-DUR,KLOR-CON Take 20 mEq by mouth daily.   predniSONE 10 MG tablet Commonly known as:  DELTASONE 4 tabs x 1 days, then 3 tabs x 2days, then 2 tabs x 2days, then 1 tab x 2days   tiotropium 18 MCG inhalation capsule Commonly known as:  SPIRIVA INHALE THE CONTENTS OF ONE CAPSULE ONCE DAILY      No Known Allergies Follow-up Information    Carrie Maize, MD. Schedule an appointment as soon as possible for a visit in 1 week(s).   Specialty:  Pediatrics Why:  Hospital follow up Contact information: [place] [address] [phone]            The results of significant diagnostics from this hospitalization (including imaging, microbiology, ancillary and laboratory) are listed below for reference.    Significant Diagnostic Studies: Dg Chest  2 View  Result Date: 04/14/2018 CLINICAL DATA:  Chest congestion, decreased mental status, hypersomnolence. Recently hospitalized for CHF, hyponatremia, and community-acquired pneumonia. Femur fracture on September 3rd 2019. EXAM: CHEST - 2 VIEW COMPARISON:  Chest x-ray of April 09, 2018 FINDINGS: The lungs are reasonably well inflated. The retrocardiac region on the left remains dense with obscuration of the hemidiaphragm. The right lung is clear. The cardiac  silhouette is enlarged. The central pulmonary vascularity is mildly prominent. There is no definite pulmonary edema. There is calcification in the wall of the thoracic aorta. The bony thorax exhibits no acute abnormality. IMPRESSION: Increased density in the left retrocardiac region compatible with atelectasis or pneumonia. This has not changed greatly since the previous study. Cardiomegaly without pulmonary edema. Thoracic aortic atherosclerosis. Electronically Signed   By: David  Martinique M.D.   On: 04/14/2018 16:23   Dg Chest 2 View  Result Date: 04/09/2018 CLINICAL DATA:  Shortness of breath with cough EXAM: CHEST - 2 VIEW COMPARISON:  03/17/2018, 03/15/2017 FINDINGS: Small pleural effusions. Cardiomegaly with vascular congestion. Patchy atelectasis or infiltrates at the left greater than right lung base. Aortic atherosclerosis. No pneumothorax. IMPRESSION: 1. Small pleural effusions with patchy left greater than right basilar atelectasis or infiltrates 2. Cardiomegaly with vascular congestion Electronically Signed   By: Donavan Foil M.D.   On: 04/09/2018 18:23    Microbiology: No results found for this or any previous visit (from the past 240 hour(s)).   Labs: Basic Metabolic Panel: Recent Labs  Lab 04/14/18 1530 04/15/18 0525 04/16/18 0651 04/17/18 0613 04/18/18 0457  NA 133* 135 131* 130* 131*  K 3.3* 4.1 3.3* 3.2* 3.7  CL 99 100 97* 97* 100  CO2 27 27 30 26 26   GLUCOSE 117* 139* 99 100* 101*  BUN 12 12 12 16 14   CREATININE 0.61 0.57 0.56 0.62 0.52  CALCIUM 8.2* 8.6* 8.1* 8.1* 8.1*  MG  --   --  2.1  --   --    Liver Function Tests: Recent Labs  Lab 04/14/18 1530  AST 21  ALT 23  ALKPHOS 78  BILITOT 0.6  PROT 6.8  ALBUMIN 3.0*   No results for input(s): LIPASE, AMYLASE in the last 168 hours. No results for input(s): AMMONIA in the last 168 hours. CBC: Recent Labs  Lab 04/14/18 1530  WBC 7.4  NEUTROABS 4.5  HGB 12.4  HCT 37.6  MCV 94.0  PLT 311   Cardiac  Enzymes: Recent Labs  Lab 04/14/18 1530  TROPONINI <0.03   BNP: BNP (last 3 results) Recent Labs    04/09/18 1718 04/14/18 1530  BNP 61.0 73.0    ProBNP (last 3 results) No results for input(s): PROBNP in the last 8760 hours.  CBG: No results for input(s): GLUCAP in the last 168 hours.     Signed:  Cristal Ford  [facility] 04/18/2018, 10:58 AM

## 2018-04-18 NOTE — Discharge Instructions (Signed)

## 2018-04-20 ENCOUNTER — Ambulatory Visit: Payer: Medicare HMO

## 2018-04-29 DIAGNOSIS — R2681 Unsteadiness on feet: Secondary | ICD-10-CM | POA: Diagnosis not present

## 2018-04-29 DIAGNOSIS — J209 Acute bronchitis, unspecified: Secondary | ICD-10-CM | POA: Diagnosis not present

## 2018-04-29 DIAGNOSIS — M6281 Muscle weakness (generalized): Secondary | ICD-10-CM | POA: Diagnosis not present

## 2018-05-02 ENCOUNTER — Encounter: Payer: Medicare HMO | Admitting: *Deleted

## 2018-05-02 DIAGNOSIS — R2681 Unsteadiness on feet: Secondary | ICD-10-CM | POA: Diagnosis not present

## 2018-05-02 DIAGNOSIS — I5033 Acute on chronic diastolic (congestive) heart failure: Secondary | ICD-10-CM | POA: Diagnosis not present

## 2018-05-02 DIAGNOSIS — E669 Obesity, unspecified: Secondary | ICD-10-CM | POA: Diagnosis not present

## 2018-05-02 DIAGNOSIS — M6281 Muscle weakness (generalized): Secondary | ICD-10-CM | POA: Diagnosis not present

## 2018-05-02 DIAGNOSIS — I10 Essential (primary) hypertension: Secondary | ICD-10-CM | POA: Diagnosis not present

## 2018-05-02 DIAGNOSIS — J209 Acute bronchitis, unspecified: Secondary | ICD-10-CM | POA: Diagnosis not present

## 2018-05-02 DIAGNOSIS — C3490 Malignant neoplasm of unspecified part of unspecified bronchus or lung: Secondary | ICD-10-CM | POA: Diagnosis not present

## 2018-05-04 DIAGNOSIS — R2681 Unsteadiness on feet: Secondary | ICD-10-CM | POA: Diagnosis not present

## 2018-05-04 DIAGNOSIS — J209 Acute bronchitis, unspecified: Secondary | ICD-10-CM | POA: Diagnosis not present

## 2018-05-04 DIAGNOSIS — M6281 Muscle weakness (generalized): Secondary | ICD-10-CM | POA: Diagnosis not present

## 2018-05-05 DIAGNOSIS — J209 Acute bronchitis, unspecified: Secondary | ICD-10-CM | POA: Diagnosis not present

## 2018-05-05 DIAGNOSIS — M6281 Muscle weakness (generalized): Secondary | ICD-10-CM | POA: Diagnosis not present

## 2018-05-05 DIAGNOSIS — R2681 Unsteadiness on feet: Secondary | ICD-10-CM | POA: Diagnosis not present

## 2018-05-06 DIAGNOSIS — J209 Acute bronchitis, unspecified: Secondary | ICD-10-CM | POA: Diagnosis not present

## 2018-05-06 DIAGNOSIS — M6281 Muscle weakness (generalized): Secondary | ICD-10-CM | POA: Diagnosis not present

## 2018-05-06 DIAGNOSIS — R2681 Unsteadiness on feet: Secondary | ICD-10-CM | POA: Diagnosis not present

## 2018-05-08 DIAGNOSIS — R0602 Shortness of breath: Secondary | ICD-10-CM | POA: Diagnosis not present

## 2018-05-08 DIAGNOSIS — M25562 Pain in left knee: Secondary | ICD-10-CM | POA: Diagnosis not present

## 2018-05-08 DIAGNOSIS — R0989 Other specified symptoms and signs involving the circulatory and respiratory systems: Secondary | ICD-10-CM | POA: Diagnosis not present

## 2018-05-09 DIAGNOSIS — C3490 Malignant neoplasm of unspecified part of unspecified bronchus or lung: Secondary | ICD-10-CM | POA: Diagnosis not present

## 2018-05-09 DIAGNOSIS — M6281 Muscle weakness (generalized): Secondary | ICD-10-CM | POA: Diagnosis not present

## 2018-05-09 DIAGNOSIS — S72401D Unspecified fracture of lower end of right femur, subsequent encounter for closed fracture with routine healing: Secondary | ICD-10-CM | POA: Diagnosis not present

## 2018-05-09 DIAGNOSIS — J209 Acute bronchitis, unspecified: Secondary | ICD-10-CM | POA: Diagnosis not present

## 2018-05-09 DIAGNOSIS — R2681 Unsteadiness on feet: Secondary | ICD-10-CM | POA: Diagnosis not present

## 2018-05-09 DIAGNOSIS — I5030 Unspecified diastolic (congestive) heart failure: Secondary | ICD-10-CM | POA: Diagnosis not present

## 2018-05-09 DIAGNOSIS — E669 Obesity, unspecified: Secondary | ICD-10-CM | POA: Diagnosis not present

## 2018-05-09 DIAGNOSIS — I5033 Acute on chronic diastolic (congestive) heart failure: Secondary | ICD-10-CM | POA: Diagnosis not present

## 2018-05-09 DIAGNOSIS — I5032 Chronic diastolic (congestive) heart failure: Secondary | ICD-10-CM | POA: Diagnosis not present

## 2018-05-09 DIAGNOSIS — I10 Essential (primary) hypertension: Secondary | ICD-10-CM | POA: Diagnosis not present

## 2018-05-09 DIAGNOSIS — J449 Chronic obstructive pulmonary disease, unspecified: Secondary | ICD-10-CM | POA: Diagnosis not present

## 2018-05-10 DIAGNOSIS — J441 Chronic obstructive pulmonary disease with (acute) exacerbation: Secondary | ICD-10-CM | POA: Diagnosis not present

## 2018-05-10 DIAGNOSIS — J309 Allergic rhinitis, unspecified: Secondary | ICD-10-CM | POA: Diagnosis not present

## 2018-05-10 DIAGNOSIS — M6281 Muscle weakness (generalized): Secondary | ICD-10-CM | POA: Diagnosis not present

## 2018-05-10 DIAGNOSIS — I5042 Chronic combined systolic (congestive) and diastolic (congestive) heart failure: Secondary | ICD-10-CM | POA: Diagnosis not present

## 2018-05-10 DIAGNOSIS — I10 Essential (primary) hypertension: Secondary | ICD-10-CM | POA: Diagnosis not present

## 2018-05-10 DIAGNOSIS — J9610 Chronic respiratory failure, unspecified whether with hypoxia or hypercapnia: Secondary | ICD-10-CM | POA: Diagnosis not present

## 2018-05-11 DIAGNOSIS — J209 Acute bronchitis, unspecified: Secondary | ICD-10-CM | POA: Diagnosis not present

## 2018-05-11 DIAGNOSIS — R2681 Unsteadiness on feet: Secondary | ICD-10-CM | POA: Diagnosis not present

## 2018-05-11 DIAGNOSIS — M6281 Muscle weakness (generalized): Secondary | ICD-10-CM | POA: Diagnosis not present

## 2018-05-13 DIAGNOSIS — R293 Abnormal posture: Secondary | ICD-10-CM | POA: Diagnosis not present

## 2018-05-13 DIAGNOSIS — M6281 Muscle weakness (generalized): Secondary | ICD-10-CM | POA: Diagnosis not present

## 2018-05-13 DIAGNOSIS — I10 Essential (primary) hypertension: Secondary | ICD-10-CM | POA: Diagnosis not present

## 2018-05-13 DIAGNOSIS — I509 Heart failure, unspecified: Secondary | ICD-10-CM | POA: Diagnosis not present

## 2018-05-13 DIAGNOSIS — J209 Acute bronchitis, unspecified: Secondary | ICD-10-CM | POA: Diagnosis not present

## 2018-05-13 DIAGNOSIS — R2681 Unsteadiness on feet: Secondary | ICD-10-CM | POA: Diagnosis not present

## 2018-05-13 DIAGNOSIS — E875 Hyperkalemia: Secondary | ICD-10-CM | POA: Diagnosis not present

## 2018-05-16 DIAGNOSIS — I509 Heart failure, unspecified: Secondary | ICD-10-CM | POA: Diagnosis not present

## 2018-05-16 DIAGNOSIS — R293 Abnormal posture: Secondary | ICD-10-CM | POA: Diagnosis not present

## 2018-05-16 DIAGNOSIS — J209 Acute bronchitis, unspecified: Secondary | ICD-10-CM | POA: Diagnosis not present

## 2018-05-16 DIAGNOSIS — R2681 Unsteadiness on feet: Secondary | ICD-10-CM | POA: Diagnosis not present

## 2018-05-16 DIAGNOSIS — M6281 Muscle weakness (generalized): Secondary | ICD-10-CM | POA: Diagnosis not present

## 2018-05-18 DIAGNOSIS — R293 Abnormal posture: Secondary | ICD-10-CM | POA: Diagnosis not present

## 2018-05-18 DIAGNOSIS — J209 Acute bronchitis, unspecified: Secondary | ICD-10-CM | POA: Diagnosis not present

## 2018-05-18 DIAGNOSIS — R2681 Unsteadiness on feet: Secondary | ICD-10-CM | POA: Diagnosis not present

## 2018-05-18 DIAGNOSIS — M6281 Muscle weakness (generalized): Secondary | ICD-10-CM | POA: Diagnosis not present

## 2018-05-19 DIAGNOSIS — I5042 Chronic combined systolic (congestive) and diastolic (congestive) heart failure: Secondary | ICD-10-CM | POA: Diagnosis not present

## 2018-05-19 DIAGNOSIS — I10 Essential (primary) hypertension: Secondary | ICD-10-CM | POA: Diagnosis not present

## 2018-05-19 DIAGNOSIS — E876 Hypokalemia: Secondary | ICD-10-CM | POA: Diagnosis not present

## 2018-05-19 DIAGNOSIS — J309 Allergic rhinitis, unspecified: Secondary | ICD-10-CM | POA: Diagnosis not present

## 2018-05-19 DIAGNOSIS — J441 Chronic obstructive pulmonary disease with (acute) exacerbation: Secondary | ICD-10-CM | POA: Diagnosis not present

## 2018-05-19 DIAGNOSIS — M6281 Muscle weakness (generalized): Secondary | ICD-10-CM | POA: Diagnosis not present

## 2018-05-19 DIAGNOSIS — J9610 Chronic respiratory failure, unspecified whether with hypoxia or hypercapnia: Secondary | ICD-10-CM | POA: Diagnosis not present

## 2018-05-20 DIAGNOSIS — R2681 Unsteadiness on feet: Secondary | ICD-10-CM | POA: Diagnosis not present

## 2018-05-20 DIAGNOSIS — R293 Abnormal posture: Secondary | ICD-10-CM | POA: Diagnosis not present

## 2018-05-20 DIAGNOSIS — J209 Acute bronchitis, unspecified: Secondary | ICD-10-CM | POA: Diagnosis not present

## 2018-05-20 DIAGNOSIS — M6281 Muscle weakness (generalized): Secondary | ICD-10-CM | POA: Diagnosis not present

## 2018-05-23 DIAGNOSIS — R2681 Unsteadiness on feet: Secondary | ICD-10-CM | POA: Diagnosis not present

## 2018-05-23 DIAGNOSIS — R293 Abnormal posture: Secondary | ICD-10-CM | POA: Diagnosis not present

## 2018-05-23 DIAGNOSIS — M6281 Muscle weakness (generalized): Secondary | ICD-10-CM | POA: Diagnosis not present

## 2018-05-23 DIAGNOSIS — J209 Acute bronchitis, unspecified: Secondary | ICD-10-CM | POA: Diagnosis not present

## 2018-05-24 DIAGNOSIS — R2681 Unsteadiness on feet: Secondary | ICD-10-CM | POA: Diagnosis not present

## 2018-05-24 DIAGNOSIS — M6281 Muscle weakness (generalized): Secondary | ICD-10-CM | POA: Diagnosis not present

## 2018-05-24 DIAGNOSIS — R293 Abnormal posture: Secondary | ICD-10-CM | POA: Diagnosis not present

## 2018-05-24 DIAGNOSIS — J209 Acute bronchitis, unspecified: Secondary | ICD-10-CM | POA: Diagnosis not present

## 2018-05-25 DIAGNOSIS — J209 Acute bronchitis, unspecified: Secondary | ICD-10-CM | POA: Diagnosis not present

## 2018-05-25 DIAGNOSIS — M6281 Muscle weakness (generalized): Secondary | ICD-10-CM | POA: Diagnosis not present

## 2018-05-25 DIAGNOSIS — R2681 Unsteadiness on feet: Secondary | ICD-10-CM | POA: Diagnosis not present

## 2018-05-25 DIAGNOSIS — R293 Abnormal posture: Secondary | ICD-10-CM | POA: Diagnosis not present

## 2018-05-26 DIAGNOSIS — E871 Hypo-osmolality and hyponatremia: Secondary | ICD-10-CM | POA: Diagnosis not present

## 2018-05-26 DIAGNOSIS — J449 Chronic obstructive pulmonary disease, unspecified: Secondary | ICD-10-CM | POA: Diagnosis not present

## 2018-05-26 DIAGNOSIS — I10 Essential (primary) hypertension: Secondary | ICD-10-CM | POA: Diagnosis not present

## 2018-05-27 DIAGNOSIS — J209 Acute bronchitis, unspecified: Secondary | ICD-10-CM | POA: Diagnosis not present

## 2018-05-27 DIAGNOSIS — R2681 Unsteadiness on feet: Secondary | ICD-10-CM | POA: Diagnosis not present

## 2018-05-27 DIAGNOSIS — M6281 Muscle weakness (generalized): Secondary | ICD-10-CM | POA: Diagnosis not present

## 2018-05-27 DIAGNOSIS — R293 Abnormal posture: Secondary | ICD-10-CM | POA: Diagnosis not present

## 2018-05-30 DIAGNOSIS — R293 Abnormal posture: Secondary | ICD-10-CM | POA: Diagnosis not present

## 2018-05-30 DIAGNOSIS — J209 Acute bronchitis, unspecified: Secondary | ICD-10-CM | POA: Diagnosis not present

## 2018-05-30 DIAGNOSIS — M6281 Muscle weakness (generalized): Secondary | ICD-10-CM | POA: Diagnosis not present

## 2018-05-30 DIAGNOSIS — R2681 Unsteadiness on feet: Secondary | ICD-10-CM | POA: Diagnosis not present

## 2018-05-31 DIAGNOSIS — M6281 Muscle weakness (generalized): Secondary | ICD-10-CM | POA: Diagnosis not present

## 2018-05-31 DIAGNOSIS — R293 Abnormal posture: Secondary | ICD-10-CM | POA: Diagnosis not present

## 2018-05-31 DIAGNOSIS — J209 Acute bronchitis, unspecified: Secondary | ICD-10-CM | POA: Diagnosis not present

## 2018-05-31 DIAGNOSIS — R2681 Unsteadiness on feet: Secondary | ICD-10-CM | POA: Diagnosis not present

## 2018-06-01 DIAGNOSIS — R2681 Unsteadiness on feet: Secondary | ICD-10-CM | POA: Diagnosis not present

## 2018-06-01 DIAGNOSIS — M6281 Muscle weakness (generalized): Secondary | ICD-10-CM | POA: Diagnosis not present

## 2018-06-01 DIAGNOSIS — J209 Acute bronchitis, unspecified: Secondary | ICD-10-CM | POA: Diagnosis not present

## 2018-06-01 DIAGNOSIS — R293 Abnormal posture: Secondary | ICD-10-CM | POA: Diagnosis not present

## 2018-06-02 DIAGNOSIS — I10 Essential (primary) hypertension: Secondary | ICD-10-CM | POA: Diagnosis not present

## 2018-06-02 DIAGNOSIS — E669 Obesity, unspecified: Secondary | ICD-10-CM | POA: Diagnosis not present

## 2018-06-02 DIAGNOSIS — R2681 Unsteadiness on feet: Secondary | ICD-10-CM | POA: Diagnosis not present

## 2018-06-02 DIAGNOSIS — C3490 Malignant neoplasm of unspecified part of unspecified bronchus or lung: Secondary | ICD-10-CM | POA: Diagnosis not present

## 2018-06-02 DIAGNOSIS — J209 Acute bronchitis, unspecified: Secondary | ICD-10-CM | POA: Diagnosis not present

## 2018-06-02 DIAGNOSIS — R293 Abnormal posture: Secondary | ICD-10-CM | POA: Diagnosis not present

## 2018-06-02 DIAGNOSIS — M6281 Muscle weakness (generalized): Secondary | ICD-10-CM | POA: Diagnosis not present

## 2018-06-02 DIAGNOSIS — I5033 Acute on chronic diastolic (congestive) heart failure: Secondary | ICD-10-CM | POA: Diagnosis not present

## 2018-06-06 DIAGNOSIS — R2681 Unsteadiness on feet: Secondary | ICD-10-CM | POA: Diagnosis not present

## 2018-06-06 DIAGNOSIS — M6281 Muscle weakness (generalized): Secondary | ICD-10-CM | POA: Diagnosis not present

## 2018-06-06 DIAGNOSIS — R293 Abnormal posture: Secondary | ICD-10-CM | POA: Diagnosis not present

## 2018-06-06 DIAGNOSIS — J209 Acute bronchitis, unspecified: Secondary | ICD-10-CM | POA: Diagnosis not present

## 2018-06-07 DIAGNOSIS — R05 Cough: Secondary | ICD-10-CM | POA: Diagnosis not present

## 2018-06-07 DIAGNOSIS — R0989 Other specified symptoms and signs involving the circulatory and respiratory systems: Secondary | ICD-10-CM | POA: Diagnosis not present

## 2018-06-08 DIAGNOSIS — R293 Abnormal posture: Secondary | ICD-10-CM | POA: Diagnosis not present

## 2018-06-08 DIAGNOSIS — M6281 Muscle weakness (generalized): Secondary | ICD-10-CM | POA: Diagnosis not present

## 2018-06-08 DIAGNOSIS — J209 Acute bronchitis, unspecified: Secondary | ICD-10-CM | POA: Diagnosis not present

## 2018-06-08 DIAGNOSIS — R2681 Unsteadiness on feet: Secondary | ICD-10-CM | POA: Diagnosis not present

## 2018-06-09 DIAGNOSIS — I5033 Acute on chronic diastolic (congestive) heart failure: Secondary | ICD-10-CM | POA: Diagnosis not present

## 2018-06-09 DIAGNOSIS — J449 Chronic obstructive pulmonary disease, unspecified: Secondary | ICD-10-CM | POA: Diagnosis not present

## 2018-06-09 DIAGNOSIS — S72401D Unspecified fracture of lower end of right femur, subsequent encounter for closed fracture with routine healing: Secondary | ICD-10-CM | POA: Diagnosis not present

## 2018-06-09 DIAGNOSIS — E669 Obesity, unspecified: Secondary | ICD-10-CM | POA: Diagnosis not present

## 2018-06-09 DIAGNOSIS — I10 Essential (primary) hypertension: Secondary | ICD-10-CM | POA: Diagnosis not present

## 2018-06-09 DIAGNOSIS — I5032 Chronic diastolic (congestive) heart failure: Secondary | ICD-10-CM | POA: Diagnosis not present

## 2018-06-09 DIAGNOSIS — I5030 Unspecified diastolic (congestive) heart failure: Secondary | ICD-10-CM | POA: Diagnosis not present

## 2018-06-09 DIAGNOSIS — C3490 Malignant neoplasm of unspecified part of unspecified bronchus or lung: Secondary | ICD-10-CM | POA: Diagnosis not present

## 2018-06-10 DIAGNOSIS — R2681 Unsteadiness on feet: Secondary | ICD-10-CM | POA: Diagnosis not present

## 2018-06-10 DIAGNOSIS — R293 Abnormal posture: Secondary | ICD-10-CM | POA: Diagnosis not present

## 2018-06-10 DIAGNOSIS — R2241 Localized swelling, mass and lump, right lower limb: Secondary | ICD-10-CM | POA: Diagnosis not present

## 2018-06-10 DIAGNOSIS — M6281 Muscle weakness (generalized): Secondary | ICD-10-CM | POA: Diagnosis not present

## 2018-06-10 DIAGNOSIS — J209 Acute bronchitis, unspecified: Secondary | ICD-10-CM | POA: Diagnosis not present

## 2018-06-13 DIAGNOSIS — R293 Abnormal posture: Secondary | ICD-10-CM | POA: Diagnosis not present

## 2018-06-13 DIAGNOSIS — J209 Acute bronchitis, unspecified: Secondary | ICD-10-CM | POA: Diagnosis not present

## 2018-06-13 DIAGNOSIS — M6281 Muscle weakness (generalized): Secondary | ICD-10-CM | POA: Diagnosis not present

## 2018-06-13 DIAGNOSIS — R2681 Unsteadiness on feet: Secondary | ICD-10-CM | POA: Diagnosis not present

## 2018-06-15 DIAGNOSIS — R293 Abnormal posture: Secondary | ICD-10-CM | POA: Diagnosis not present

## 2018-06-15 DIAGNOSIS — R2681 Unsteadiness on feet: Secondary | ICD-10-CM | POA: Diagnosis not present

## 2018-06-15 DIAGNOSIS — M6281 Muscle weakness (generalized): Secondary | ICD-10-CM | POA: Diagnosis not present

## 2018-06-15 DIAGNOSIS — J209 Acute bronchitis, unspecified: Secondary | ICD-10-CM | POA: Diagnosis not present

## 2018-06-15 DIAGNOSIS — I5030 Unspecified diastolic (congestive) heart failure: Secondary | ICD-10-CM | POA: Diagnosis not present

## 2018-06-15 DIAGNOSIS — J181 Lobar pneumonia, unspecified organism: Secondary | ICD-10-CM | POA: Diagnosis not present

## 2018-06-15 DIAGNOSIS — J449 Chronic obstructive pulmonary disease, unspecified: Secondary | ICD-10-CM | POA: Diagnosis not present

## 2018-06-15 DIAGNOSIS — S72301D Unspecified fracture of shaft of right femur, subsequent encounter for closed fracture with routine healing: Secondary | ICD-10-CM | POA: Diagnosis not present

## 2018-06-16 DIAGNOSIS — J441 Chronic obstructive pulmonary disease with (acute) exacerbation: Secondary | ICD-10-CM | POA: Diagnosis not present

## 2018-06-16 DIAGNOSIS — J019 Acute sinusitis, unspecified: Secondary | ICD-10-CM | POA: Diagnosis not present

## 2018-06-16 DIAGNOSIS — I10 Essential (primary) hypertension: Secondary | ICD-10-CM | POA: Diagnosis not present

## 2018-06-16 DIAGNOSIS — J9610 Chronic respiratory failure, unspecified whether with hypoxia or hypercapnia: Secondary | ICD-10-CM | POA: Diagnosis not present

## 2018-06-16 DIAGNOSIS — I5042 Chronic combined systolic (congestive) and diastolic (congestive) heart failure: Secondary | ICD-10-CM | POA: Diagnosis not present

## 2018-06-16 DIAGNOSIS — J209 Acute bronchitis, unspecified: Secondary | ICD-10-CM | POA: Diagnosis not present

## 2018-06-16 DIAGNOSIS — M6281 Muscle weakness (generalized): Secondary | ICD-10-CM | POA: Diagnosis not present

## 2018-06-17 DIAGNOSIS — R05 Cough: Secondary | ICD-10-CM | POA: Diagnosis not present

## 2018-06-17 DIAGNOSIS — J209 Acute bronchitis, unspecified: Secondary | ICD-10-CM | POA: Diagnosis not present

## 2018-06-17 DIAGNOSIS — R0989 Other specified symptoms and signs involving the circulatory and respiratory systems: Secondary | ICD-10-CM | POA: Diagnosis not present

## 2018-06-17 DIAGNOSIS — M6281 Muscle weakness (generalized): Secondary | ICD-10-CM | POA: Diagnosis not present

## 2018-06-17 DIAGNOSIS — R2681 Unsteadiness on feet: Secondary | ICD-10-CM | POA: Diagnosis not present

## 2018-06-17 DIAGNOSIS — R293 Abnormal posture: Secondary | ICD-10-CM | POA: Diagnosis not present

## 2018-06-20 DIAGNOSIS — R293 Abnormal posture: Secondary | ICD-10-CM | POA: Diagnosis not present

## 2018-06-20 DIAGNOSIS — M6281 Muscle weakness (generalized): Secondary | ICD-10-CM | POA: Diagnosis not present

## 2018-06-20 DIAGNOSIS — J209 Acute bronchitis, unspecified: Secondary | ICD-10-CM | POA: Diagnosis not present

## 2018-06-20 DIAGNOSIS — R2681 Unsteadiness on feet: Secondary | ICD-10-CM | POA: Diagnosis not present

## 2018-06-22 DIAGNOSIS — J209 Acute bronchitis, unspecified: Secondary | ICD-10-CM | POA: Diagnosis not present

## 2018-06-22 DIAGNOSIS — M6281 Muscle weakness (generalized): Secondary | ICD-10-CM | POA: Diagnosis not present

## 2018-06-22 DIAGNOSIS — R293 Abnormal posture: Secondary | ICD-10-CM | POA: Diagnosis not present

## 2018-06-22 DIAGNOSIS — R2681 Unsteadiness on feet: Secondary | ICD-10-CM | POA: Diagnosis not present

## 2018-06-24 DIAGNOSIS — R293 Abnormal posture: Secondary | ICD-10-CM | POA: Diagnosis not present

## 2018-06-24 DIAGNOSIS — R2681 Unsteadiness on feet: Secondary | ICD-10-CM | POA: Diagnosis not present

## 2018-06-24 DIAGNOSIS — J209 Acute bronchitis, unspecified: Secondary | ICD-10-CM | POA: Diagnosis not present

## 2018-06-24 DIAGNOSIS — M6281 Muscle weakness (generalized): Secondary | ICD-10-CM | POA: Diagnosis not present

## 2018-07-02 DIAGNOSIS — I10 Essential (primary) hypertension: Secondary | ICD-10-CM | POA: Diagnosis not present

## 2018-07-02 DIAGNOSIS — E669 Obesity, unspecified: Secondary | ICD-10-CM | POA: Diagnosis not present

## 2018-07-02 DIAGNOSIS — I5033 Acute on chronic diastolic (congestive) heart failure: Secondary | ICD-10-CM | POA: Diagnosis not present

## 2018-07-02 DIAGNOSIS — C3490 Malignant neoplasm of unspecified part of unspecified bronchus or lung: Secondary | ICD-10-CM | POA: Diagnosis not present

## 2018-07-05 DIAGNOSIS — N209 Urinary calculus, unspecified: Secondary | ICD-10-CM | POA: Diagnosis not present

## 2018-07-05 DIAGNOSIS — J9610 Chronic respiratory failure, unspecified whether with hypoxia or hypercapnia: Secondary | ICD-10-CM | POA: Diagnosis not present

## 2018-07-05 DIAGNOSIS — N200 Calculus of kidney: Secondary | ICD-10-CM | POA: Diagnosis not present

## 2018-07-05 DIAGNOSIS — M6281 Muscle weakness (generalized): Secondary | ICD-10-CM | POA: Diagnosis not present

## 2018-07-05 DIAGNOSIS — J441 Chronic obstructive pulmonary disease with (acute) exacerbation: Secondary | ICD-10-CM | POA: Diagnosis not present

## 2018-07-05 DIAGNOSIS — I5042 Chronic combined systolic (congestive) and diastolic (congestive) heart failure: Secondary | ICD-10-CM | POA: Diagnosis not present

## 2018-07-05 DIAGNOSIS — I10 Essential (primary) hypertension: Secondary | ICD-10-CM | POA: Diagnosis not present

## 2018-07-09 DIAGNOSIS — S72401D Unspecified fracture of lower end of right femur, subsequent encounter for closed fracture with routine healing: Secondary | ICD-10-CM | POA: Diagnosis not present

## 2018-07-09 DIAGNOSIS — I10 Essential (primary) hypertension: Secondary | ICD-10-CM | POA: Diagnosis not present

## 2018-07-09 DIAGNOSIS — I5032 Chronic diastolic (congestive) heart failure: Secondary | ICD-10-CM | POA: Diagnosis not present

## 2018-07-09 DIAGNOSIS — I5033 Acute on chronic diastolic (congestive) heart failure: Secondary | ICD-10-CM | POA: Diagnosis not present

## 2018-07-09 DIAGNOSIS — E669 Obesity, unspecified: Secondary | ICD-10-CM | POA: Diagnosis not present

## 2018-07-09 DIAGNOSIS — C3490 Malignant neoplasm of unspecified part of unspecified bronchus or lung: Secondary | ICD-10-CM | POA: Diagnosis not present

## 2018-07-09 DIAGNOSIS — I5030 Unspecified diastolic (congestive) heart failure: Secondary | ICD-10-CM | POA: Diagnosis not present

## 2018-07-09 DIAGNOSIS — J449 Chronic obstructive pulmonary disease, unspecified: Secondary | ICD-10-CM | POA: Diagnosis not present

## 2018-07-25 DIAGNOSIS — I5030 Unspecified diastolic (congestive) heart failure: Secondary | ICD-10-CM | POA: Diagnosis not present

## 2018-07-25 DIAGNOSIS — J449 Chronic obstructive pulmonary disease, unspecified: Secondary | ICD-10-CM | POA: Diagnosis not present

## 2018-07-25 DIAGNOSIS — S72301D Unspecified fracture of shaft of right femur, subsequent encounter for closed fracture with routine healing: Secondary | ICD-10-CM | POA: Diagnosis not present

## 2018-07-25 DIAGNOSIS — J182 Hypostatic pneumonia, unspecified organism: Secondary | ICD-10-CM | POA: Diagnosis not present

## 2018-07-26 DIAGNOSIS — M6281 Muscle weakness (generalized): Secondary | ICD-10-CM | POA: Diagnosis not present

## 2018-07-26 DIAGNOSIS — J9610 Chronic respiratory failure, unspecified whether with hypoxia or hypercapnia: Secondary | ICD-10-CM | POA: Diagnosis not present

## 2018-07-26 DIAGNOSIS — I5042 Chronic combined systolic (congestive) and diastolic (congestive) heart failure: Secondary | ICD-10-CM | POA: Diagnosis not present

## 2018-07-26 DIAGNOSIS — J449 Chronic obstructive pulmonary disease, unspecified: Secondary | ICD-10-CM | POA: Diagnosis not present

## 2018-07-26 DIAGNOSIS — I10 Essential (primary) hypertension: Secondary | ICD-10-CM | POA: Diagnosis not present

## 2018-07-27 DIAGNOSIS — R600 Localized edema: Secondary | ICD-10-CM | POA: Diagnosis not present

## 2018-07-28 DIAGNOSIS — J441 Chronic obstructive pulmonary disease with (acute) exacerbation: Secondary | ICD-10-CM | POA: Diagnosis not present

## 2018-07-28 DIAGNOSIS — E876 Hypokalemia: Secondary | ICD-10-CM | POA: Diagnosis not present

## 2018-07-28 DIAGNOSIS — I10 Essential (primary) hypertension: Secondary | ICD-10-CM | POA: Diagnosis not present

## 2018-07-28 DIAGNOSIS — J9610 Chronic respiratory failure, unspecified whether with hypoxia or hypercapnia: Secondary | ICD-10-CM | POA: Diagnosis not present

## 2018-07-28 DIAGNOSIS — I5042 Chronic combined systolic (congestive) and diastolic (congestive) heart failure: Secondary | ICD-10-CM | POA: Diagnosis not present

## 2018-07-28 DIAGNOSIS — M6281 Muscle weakness (generalized): Secondary | ICD-10-CM | POA: Diagnosis not present

## 2018-08-02 DIAGNOSIS — I5033 Acute on chronic diastolic (congestive) heart failure: Secondary | ICD-10-CM | POA: Diagnosis not present

## 2018-08-02 DIAGNOSIS — C3490 Malignant neoplasm of unspecified part of unspecified bronchus or lung: Secondary | ICD-10-CM | POA: Diagnosis not present

## 2018-08-02 DIAGNOSIS — E669 Obesity, unspecified: Secondary | ICD-10-CM | POA: Diagnosis not present

## 2018-08-02 DIAGNOSIS — I10 Essential (primary) hypertension: Secondary | ICD-10-CM | POA: Diagnosis not present

## 2018-08-03 DIAGNOSIS — I5030 Unspecified diastolic (congestive) heart failure: Secondary | ICD-10-CM | POA: Diagnosis not present

## 2018-08-03 DIAGNOSIS — I10 Essential (primary) hypertension: Secondary | ICD-10-CM | POA: Diagnosis not present

## 2018-08-03 DIAGNOSIS — J9610 Chronic respiratory failure, unspecified whether with hypoxia or hypercapnia: Secondary | ICD-10-CM | POA: Diagnosis not present

## 2018-08-03 DIAGNOSIS — J449 Chronic obstructive pulmonary disease, unspecified: Secondary | ICD-10-CM | POA: Diagnosis not present

## 2018-08-03 DIAGNOSIS — I5042 Chronic combined systolic (congestive) and diastolic (congestive) heart failure: Secondary | ICD-10-CM | POA: Diagnosis not present

## 2018-08-09 DIAGNOSIS — E669 Obesity, unspecified: Secondary | ICD-10-CM | POA: Diagnosis not present

## 2018-08-09 DIAGNOSIS — S72401D Unspecified fracture of lower end of right femur, subsequent encounter for closed fracture with routine healing: Secondary | ICD-10-CM | POA: Diagnosis not present

## 2018-08-09 DIAGNOSIS — I10 Essential (primary) hypertension: Secondary | ICD-10-CM | POA: Diagnosis not present

## 2018-08-09 DIAGNOSIS — I5032 Chronic diastolic (congestive) heart failure: Secondary | ICD-10-CM | POA: Diagnosis not present

## 2018-08-09 DIAGNOSIS — J449 Chronic obstructive pulmonary disease, unspecified: Secondary | ICD-10-CM | POA: Diagnosis not present

## 2018-08-09 DIAGNOSIS — C3490 Malignant neoplasm of unspecified part of unspecified bronchus or lung: Secondary | ICD-10-CM | POA: Diagnosis not present

## 2018-08-09 DIAGNOSIS — I5033 Acute on chronic diastolic (congestive) heart failure: Secondary | ICD-10-CM | POA: Diagnosis not present

## 2018-08-09 DIAGNOSIS — I5030 Unspecified diastolic (congestive) heart failure: Secondary | ICD-10-CM | POA: Diagnosis not present

## 2018-08-10 ENCOUNTER — Ambulatory Visit: Payer: Medicare HMO | Admitting: Pediatrics

## 2018-08-11 DIAGNOSIS — J9610 Chronic respiratory failure, unspecified whether with hypoxia or hypercapnia: Secondary | ICD-10-CM | POA: Diagnosis not present

## 2018-08-11 DIAGNOSIS — I10 Essential (primary) hypertension: Secondary | ICD-10-CM | POA: Diagnosis not present

## 2018-08-11 DIAGNOSIS — J441 Chronic obstructive pulmonary disease with (acute) exacerbation: Secondary | ICD-10-CM | POA: Diagnosis not present

## 2018-08-11 DIAGNOSIS — I5042 Chronic combined systolic (congestive) and diastolic (congestive) heart failure: Secondary | ICD-10-CM | POA: Diagnosis not present

## 2018-08-11 DIAGNOSIS — M6281 Muscle weakness (generalized): Secondary | ICD-10-CM | POA: Diagnosis not present

## 2018-09-04 DIAGNOSIS — R2681 Unsteadiness on feet: Secondary | ICD-10-CM | POA: Diagnosis not present

## 2018-09-04 DIAGNOSIS — R140 Abdominal distension (gaseous): Secondary | ICD-10-CM | POA: Diagnosis not present

## 2018-09-05 DIAGNOSIS — R109 Unspecified abdominal pain: Secondary | ICD-10-CM | POA: Diagnosis not present

## 2018-09-06 DIAGNOSIS — R1030 Lower abdominal pain, unspecified: Secondary | ICD-10-CM | POA: Diagnosis not present

## 2018-09-06 DIAGNOSIS — J9610 Chronic respiratory failure, unspecified whether with hypoxia or hypercapnia: Secondary | ICD-10-CM | POA: Diagnosis not present

## 2018-09-06 DIAGNOSIS — K5712 Diverticulitis of small intestine without perforation or abscess without bleeding: Secondary | ICD-10-CM | POA: Diagnosis not present

## 2018-09-06 DIAGNOSIS — M6281 Muscle weakness (generalized): Secondary | ICD-10-CM | POA: Diagnosis not present

## 2018-09-06 DIAGNOSIS — I10 Essential (primary) hypertension: Secondary | ICD-10-CM | POA: Diagnosis not present

## 2018-09-06 DIAGNOSIS — K5641 Fecal impaction: Secondary | ICD-10-CM | POA: Diagnosis not present

## 2018-09-06 DIAGNOSIS — R197 Diarrhea, unspecified: Secondary | ICD-10-CM | POA: Diagnosis not present

## 2018-09-06 DIAGNOSIS — J441 Chronic obstructive pulmonary disease with (acute) exacerbation: Secondary | ICD-10-CM | POA: Diagnosis not present

## 2018-09-06 DIAGNOSIS — I5042 Chronic combined systolic (congestive) and diastolic (congestive) heart failure: Secondary | ICD-10-CM | POA: Diagnosis not present

## 2018-09-08 DIAGNOSIS — I10 Essential (primary) hypertension: Secondary | ICD-10-CM | POA: Diagnosis not present

## 2018-09-08 DIAGNOSIS — J9610 Chronic respiratory failure, unspecified whether with hypoxia or hypercapnia: Secondary | ICD-10-CM | POA: Diagnosis not present

## 2018-09-08 DIAGNOSIS — J441 Chronic obstructive pulmonary disease with (acute) exacerbation: Secondary | ICD-10-CM | POA: Diagnosis not present

## 2018-09-08 DIAGNOSIS — K5641 Fecal impaction: Secondary | ICD-10-CM | POA: Diagnosis not present

## 2018-09-08 DIAGNOSIS — R1030 Lower abdominal pain, unspecified: Secondary | ICD-10-CM | POA: Diagnosis not present

## 2018-09-08 DIAGNOSIS — I5042 Chronic combined systolic (congestive) and diastolic (congestive) heart failure: Secondary | ICD-10-CM | POA: Diagnosis not present

## 2018-09-08 DIAGNOSIS — K5712 Diverticulitis of small intestine without perforation or abscess without bleeding: Secondary | ICD-10-CM | POA: Diagnosis not present

## 2018-09-08 DIAGNOSIS — M6281 Muscle weakness (generalized): Secondary | ICD-10-CM | POA: Diagnosis not present

## 2018-09-09 DIAGNOSIS — I5032 Chronic diastolic (congestive) heart failure: Secondary | ICD-10-CM | POA: Diagnosis not present

## 2018-09-09 DIAGNOSIS — I10 Essential (primary) hypertension: Secondary | ICD-10-CM | POA: Diagnosis not present

## 2018-09-09 DIAGNOSIS — C3490 Malignant neoplasm of unspecified part of unspecified bronchus or lung: Secondary | ICD-10-CM | POA: Diagnosis not present

## 2018-09-09 DIAGNOSIS — I5033 Acute on chronic diastolic (congestive) heart failure: Secondary | ICD-10-CM | POA: Diagnosis not present

## 2018-09-09 DIAGNOSIS — J449 Chronic obstructive pulmonary disease, unspecified: Secondary | ICD-10-CM | POA: Diagnosis not present

## 2018-09-09 DIAGNOSIS — S72401D Unspecified fracture of lower end of right femur, subsequent encounter for closed fracture with routine healing: Secondary | ICD-10-CM | POA: Diagnosis not present

## 2018-09-09 DIAGNOSIS — E669 Obesity, unspecified: Secondary | ICD-10-CM | POA: Diagnosis not present

## 2018-09-09 DIAGNOSIS — I5030 Unspecified diastolic (congestive) heart failure: Secondary | ICD-10-CM | POA: Diagnosis not present

## 2018-09-14 DIAGNOSIS — R140 Abdominal distension (gaseous): Secondary | ICD-10-CM | POA: Diagnosis not present

## 2018-09-14 DIAGNOSIS — S72301D Unspecified fracture of shaft of right femur, subsequent encounter for closed fracture with routine healing: Secondary | ICD-10-CM | POA: Diagnosis not present

## 2018-09-14 DIAGNOSIS — J449 Chronic obstructive pulmonary disease, unspecified: Secondary | ICD-10-CM | POA: Diagnosis not present

## 2018-09-14 DIAGNOSIS — I5030 Unspecified diastolic (congestive) heart failure: Secondary | ICD-10-CM | POA: Diagnosis not present

## 2018-09-20 DIAGNOSIS — K5939 Other megacolon: Secondary | ICD-10-CM | POA: Diagnosis not present

## 2018-09-20 DIAGNOSIS — J9811 Atelectasis: Secondary | ICD-10-CM | POA: Diagnosis not present

## 2018-09-20 DIAGNOSIS — R140 Abdominal distension (gaseous): Secondary | ICD-10-CM | POA: Diagnosis not present

## 2018-09-20 DIAGNOSIS — K7689 Other specified diseases of liver: Secondary | ICD-10-CM | POA: Diagnosis not present

## 2018-09-20 DIAGNOSIS — N281 Cyst of kidney, acquired: Secondary | ICD-10-CM | POA: Diagnosis not present

## 2018-09-20 DIAGNOSIS — J90 Pleural effusion, not elsewhere classified: Secondary | ICD-10-CM | POA: Diagnosis not present

## 2018-10-08 DIAGNOSIS — C3490 Malignant neoplasm of unspecified part of unspecified bronchus or lung: Secondary | ICD-10-CM | POA: Diagnosis not present

## 2018-10-08 DIAGNOSIS — S72401D Unspecified fracture of lower end of right femur, subsequent encounter for closed fracture with routine healing: Secondary | ICD-10-CM | POA: Diagnosis not present

## 2018-10-08 DIAGNOSIS — I5033 Acute on chronic diastolic (congestive) heart failure: Secondary | ICD-10-CM | POA: Diagnosis not present

## 2018-10-08 DIAGNOSIS — E669 Obesity, unspecified: Secondary | ICD-10-CM | POA: Diagnosis not present

## 2018-10-14 DIAGNOSIS — I5030 Unspecified diastolic (congestive) heart failure: Secondary | ICD-10-CM | POA: Diagnosis not present

## 2018-10-14 DIAGNOSIS — K598 Other specified functional intestinal disorders: Secondary | ICD-10-CM | POA: Diagnosis not present

## 2018-10-14 DIAGNOSIS — J449 Chronic obstructive pulmonary disease, unspecified: Secondary | ICD-10-CM | POA: Diagnosis not present

## 2018-11-08 DIAGNOSIS — R109 Unspecified abdominal pain: Secondary | ICD-10-CM | POA: Diagnosis not present

## 2018-11-23 DIAGNOSIS — K598 Other specified functional intestinal disorders: Secondary | ICD-10-CM | POA: Diagnosis not present

## 2018-11-23 DIAGNOSIS — I5030 Unspecified diastolic (congestive) heart failure: Secondary | ICD-10-CM | POA: Diagnosis not present

## 2018-11-23 DIAGNOSIS — J449 Chronic obstructive pulmonary disease, unspecified: Secondary | ICD-10-CM | POA: Diagnosis not present

## 2018-11-24 DIAGNOSIS — K5981 Ogilvie syndrome: Secondary | ICD-10-CM | POA: Insufficient documentation

## 2018-12-13 DIAGNOSIS — L259 Unspecified contact dermatitis, unspecified cause: Secondary | ICD-10-CM | POA: Diagnosis not present

## 2018-12-13 DIAGNOSIS — L98412 Non-pressure chronic ulcer of buttock with fat layer exposed: Secondary | ICD-10-CM | POA: Diagnosis not present

## 2018-12-15 DIAGNOSIS — L089 Local infection of the skin and subcutaneous tissue, unspecified: Secondary | ICD-10-CM | POA: Diagnosis not present

## 2018-12-20 DIAGNOSIS — L98412 Non-pressure chronic ulcer of buttock with fat layer exposed: Secondary | ICD-10-CM | POA: Diagnosis not present

## 2018-12-27 DIAGNOSIS — L98412 Non-pressure chronic ulcer of buttock with fat layer exposed: Secondary | ICD-10-CM | POA: Diagnosis not present

## 2019-01-03 DIAGNOSIS — L98412 Non-pressure chronic ulcer of buttock with fat layer exposed: Secondary | ICD-10-CM | POA: Diagnosis not present

## 2019-01-10 DIAGNOSIS — L98412 Non-pressure chronic ulcer of buttock with fat layer exposed: Secondary | ICD-10-CM | POA: Diagnosis not present

## 2019-01-10 DIAGNOSIS — L22 Diaper dermatitis: Secondary | ICD-10-CM | POA: Diagnosis not present

## 2019-02-08 DIAGNOSIS — F0390 Unspecified dementia without behavioral disturbance: Secondary | ICD-10-CM | POA: Insufficient documentation

## 2019-05-08 DIAGNOSIS — K599 Functional intestinal disorder, unspecified: Secondary | ICD-10-CM | POA: Diagnosis not present

## 2019-05-08 DIAGNOSIS — I110 Hypertensive heart disease with heart failure: Secondary | ICD-10-CM | POA: Diagnosis not present

## 2019-05-08 DIAGNOSIS — J449 Chronic obstructive pulmonary disease, unspecified: Secondary | ICD-10-CM | POA: Diagnosis not present

## 2019-05-08 DIAGNOSIS — Z7982 Long term (current) use of aspirin: Secondary | ICD-10-CM | POA: Diagnosis not present

## 2019-05-08 DIAGNOSIS — Z23 Encounter for immunization: Secondary | ICD-10-CM | POA: Diagnosis not present

## 2019-05-08 DIAGNOSIS — G909 Disorder of the autonomic nervous system, unspecified: Secondary | ICD-10-CM | POA: Diagnosis not present

## 2019-05-08 DIAGNOSIS — I5030 Unspecified diastolic (congestive) heart failure: Secondary | ICD-10-CM | POA: Diagnosis not present

## 2019-10-25 IMAGING — DX DG KNEE COMPLETE 4+V*R*
4 series · 4 of 4 positions shown · non-contrast
Comparison: None.

CLINICAL DATA: Patient fell today and has right knee pain.

EXAM:
RIGHT KNEE - COMPLETE 4+ VIEW

[knee ap (1 of 3)]
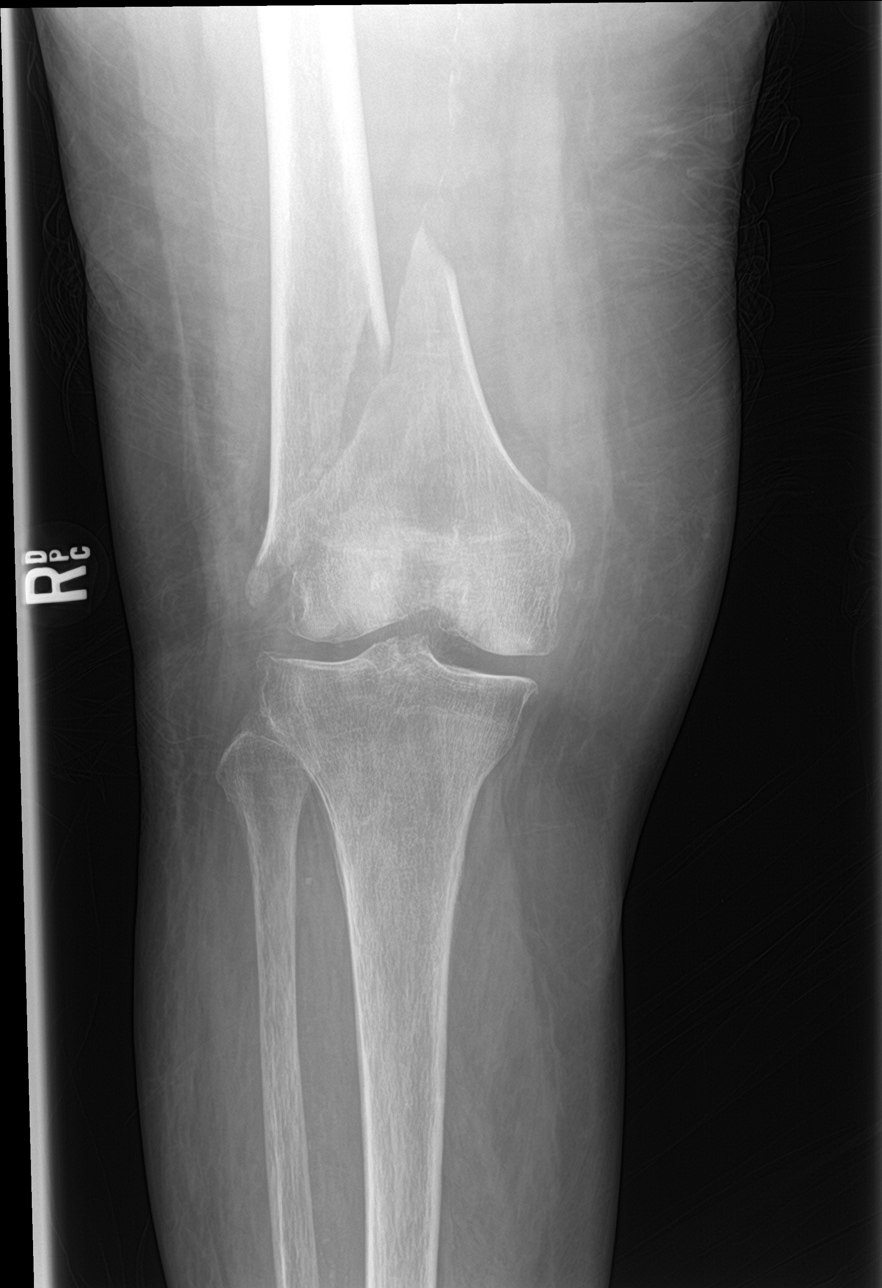

[knee ap (2 of 3)]
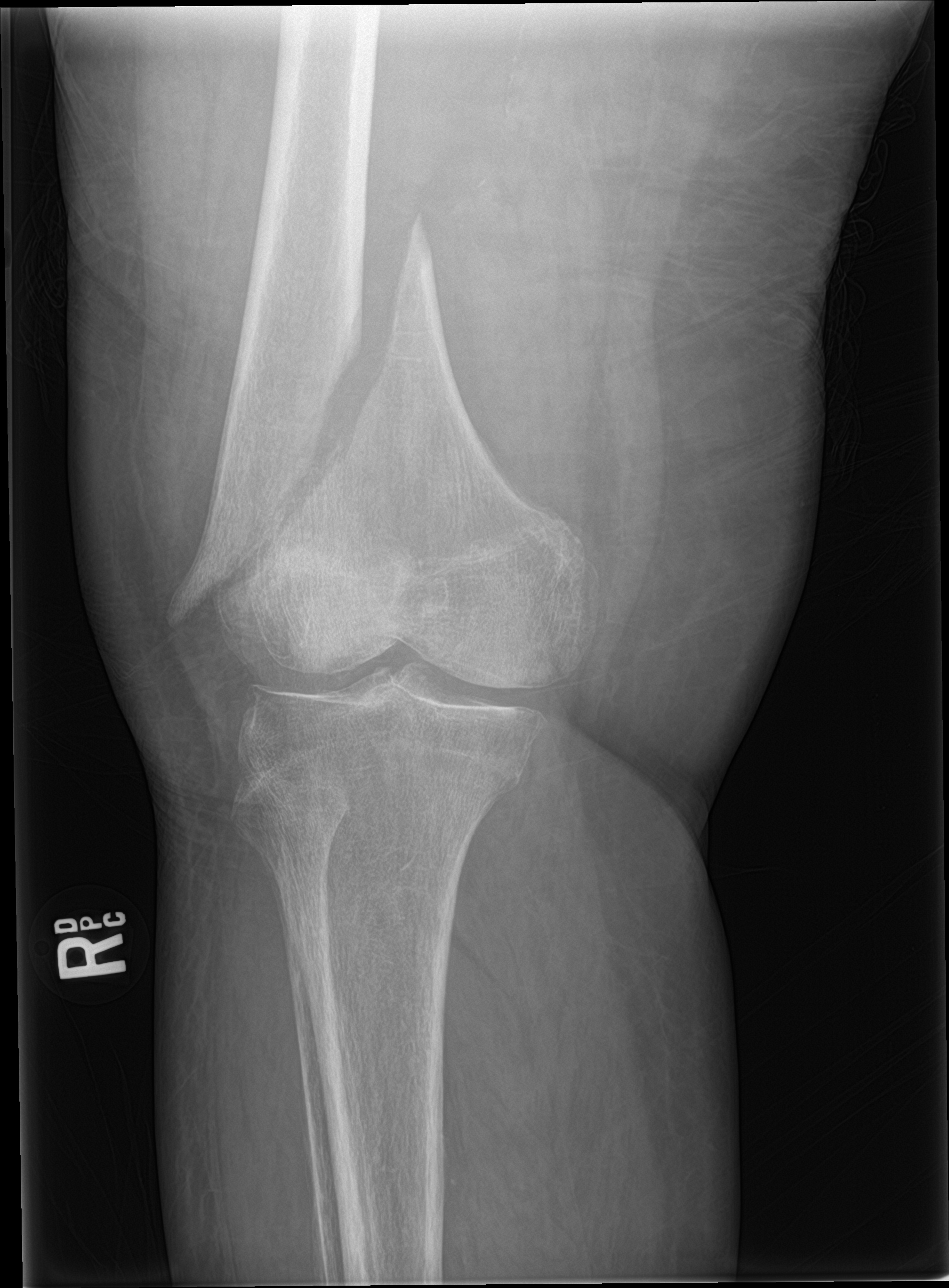

[knee lat]
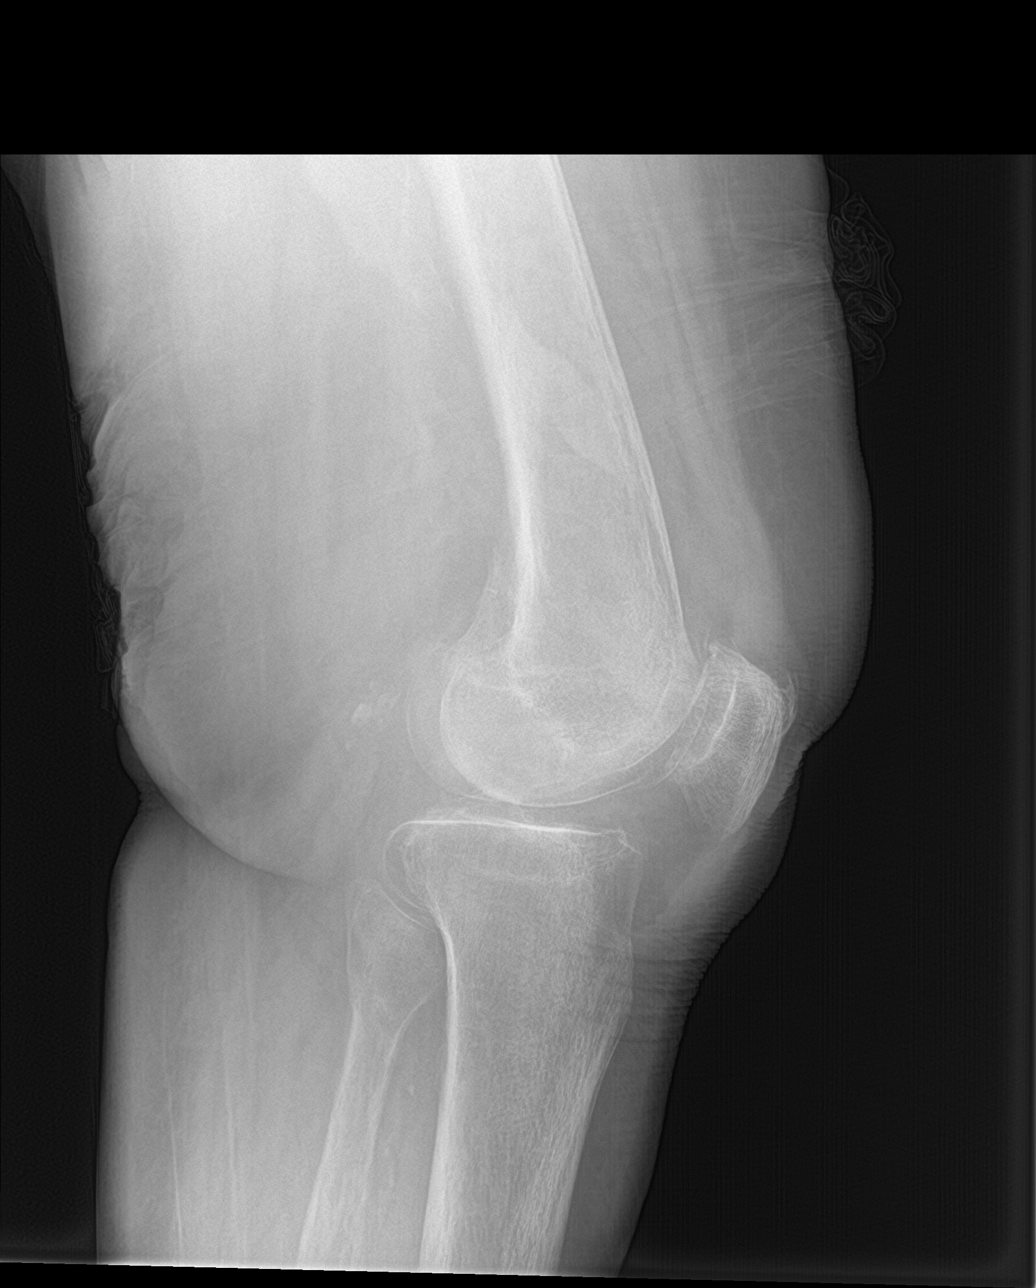

[knee ap (3 of 3)]
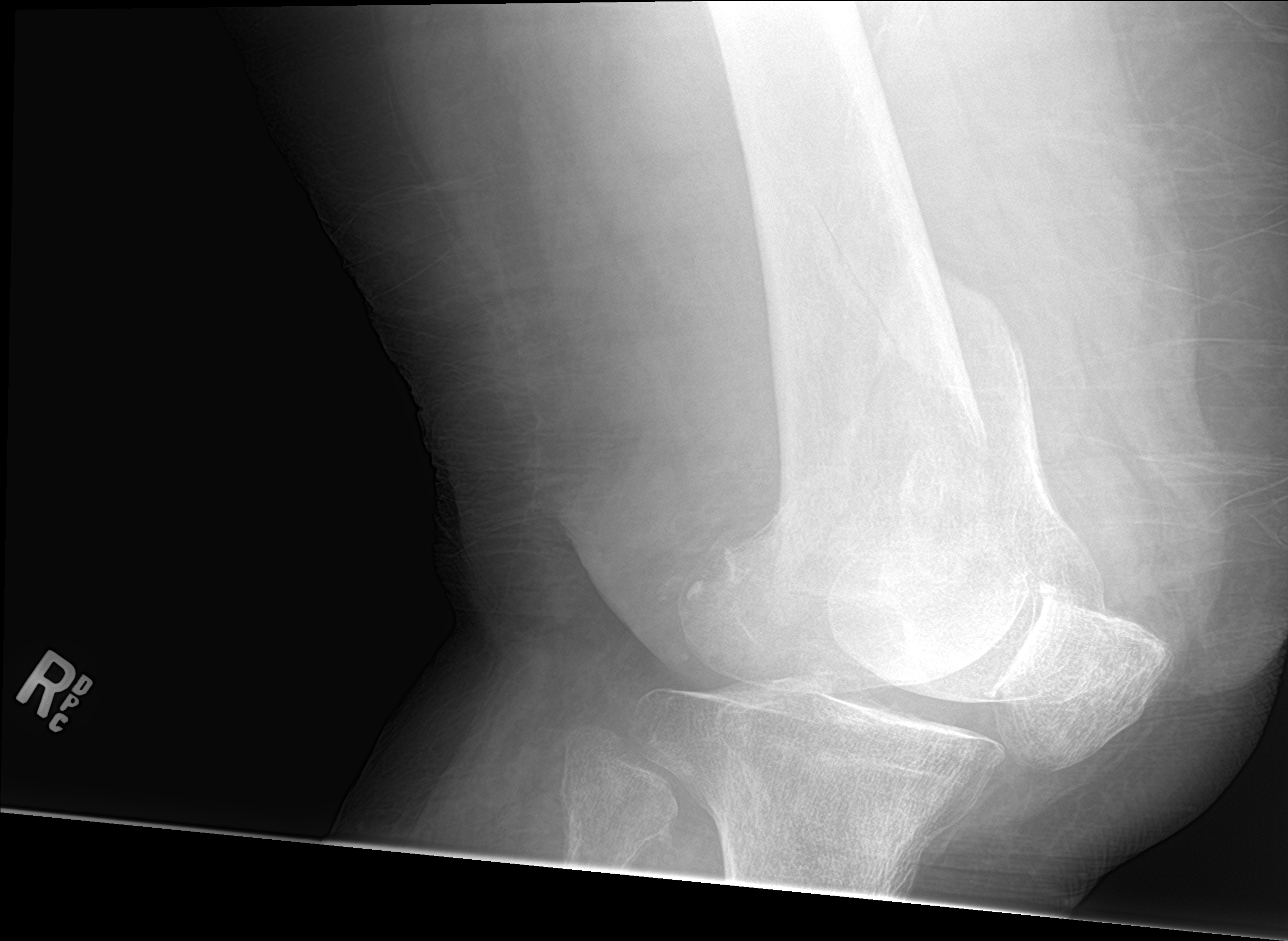

[4 of 4 positions shown; findings below may reference images not displayed]

FINDINGS: Acute oblique displaced closed fracture of the distal right femoral
diaphysis extending into the metaphysis. There is of the 16 mm of
medial displacement of the femoral condyles and distal fracture
fragment relative to the femoral diaphysis. No intra-articular
extension is identified. Osteoarthritic joint space narrowing of the
femorotibial compartment is seen. The patellofemoral appears intact.
No joint effusion.
IMPRESSION: Acute, closed, oblique fracture of the distal femoral diaphysis
exiting out the lateral femoral metaphysis and with resultant 16 mm
of medial displacement of the femoral condyles and distal fracture
fragment.

## 2019-10-26 IMAGING — RF DG C-ARM 61-120 MIN
1 series · 8 of 8 positions shown · non-contrast
Comparison: Preoperative evaluation 03/15/2018

CLINICAL DATA: ORIF distal RIGHT femoral fracture

EXAM:
RIGHT FEMUR 2 VIEWS; RIGHT FEMUR PORTABLE 2 VIEW; DG C-ARM 61-120
MIN

[Series 1: run · 8 of 8 slices shown]
[im 1/8]
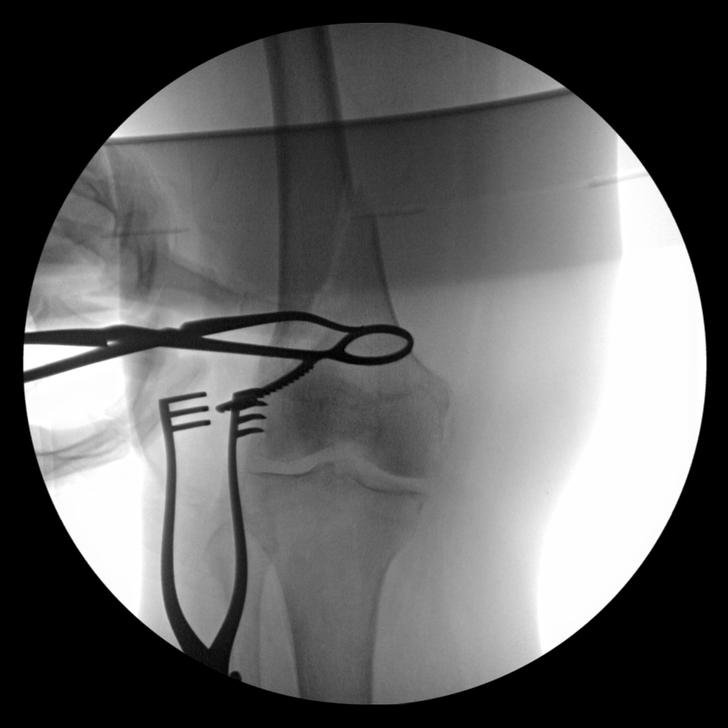
[im 2/8]
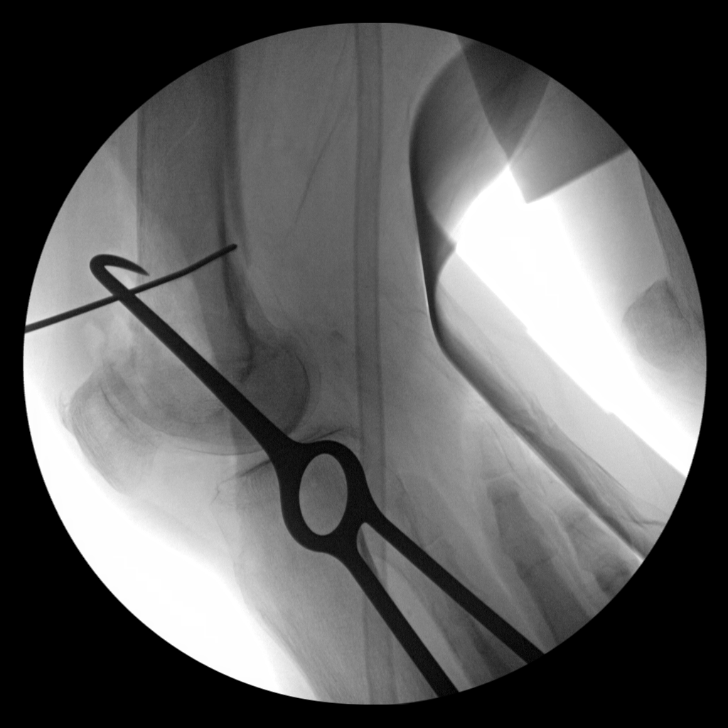
[im 3/8]
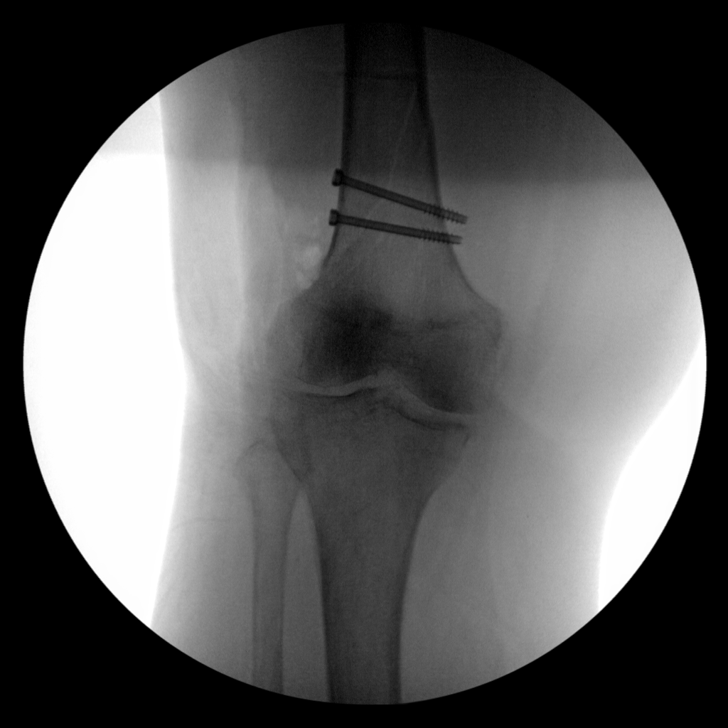
[im 4/8]
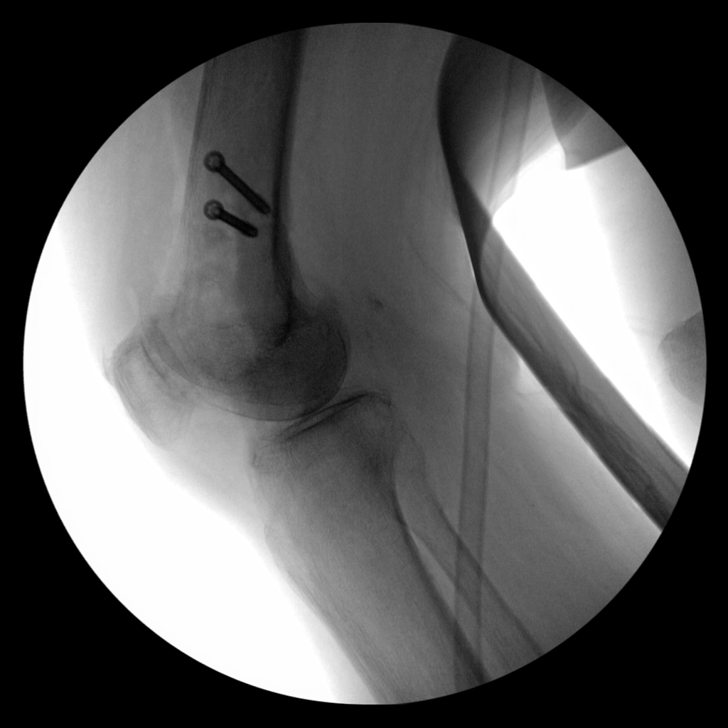
[im 5/8]
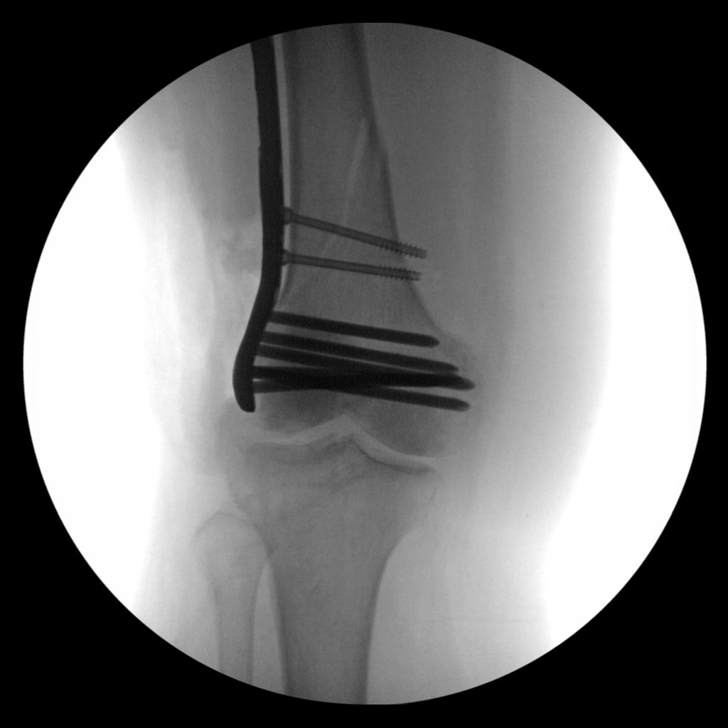
[im 6/8]
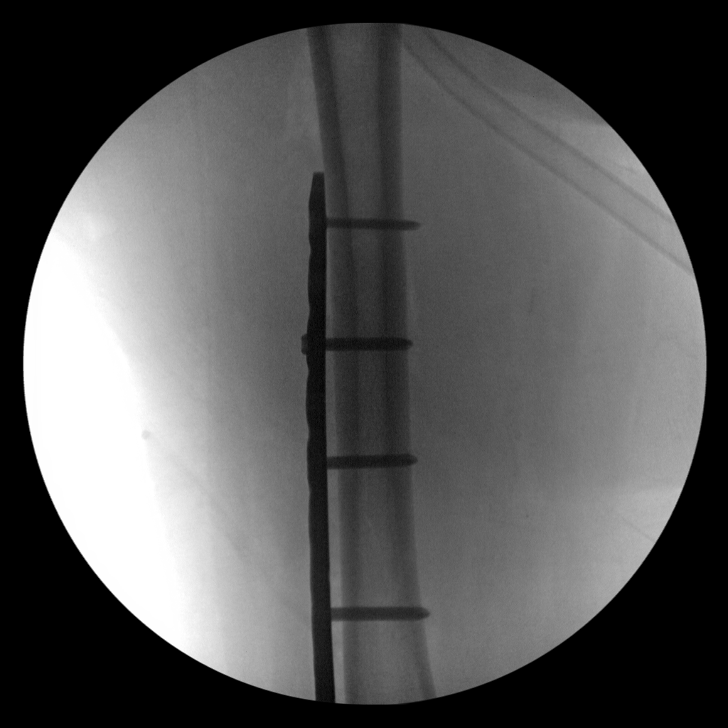
[im 7/8]
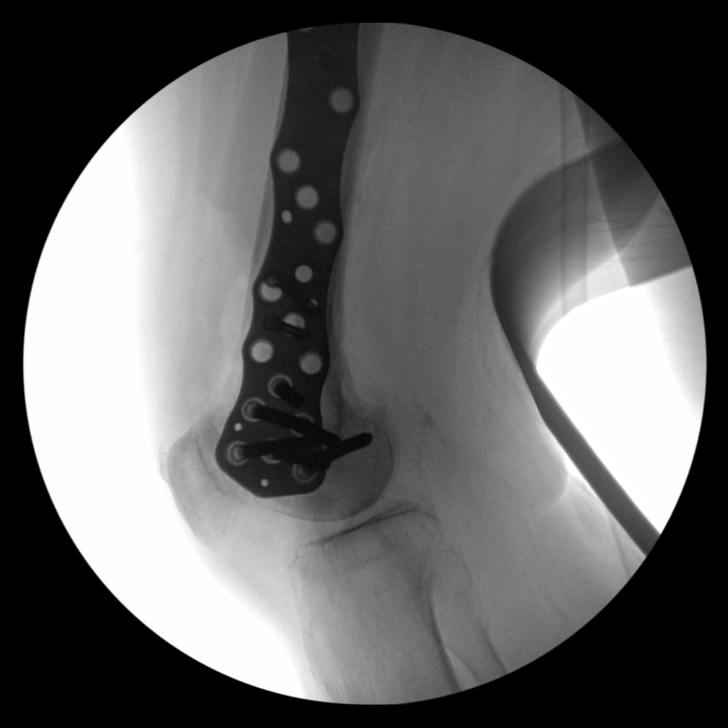
[im 8/8]
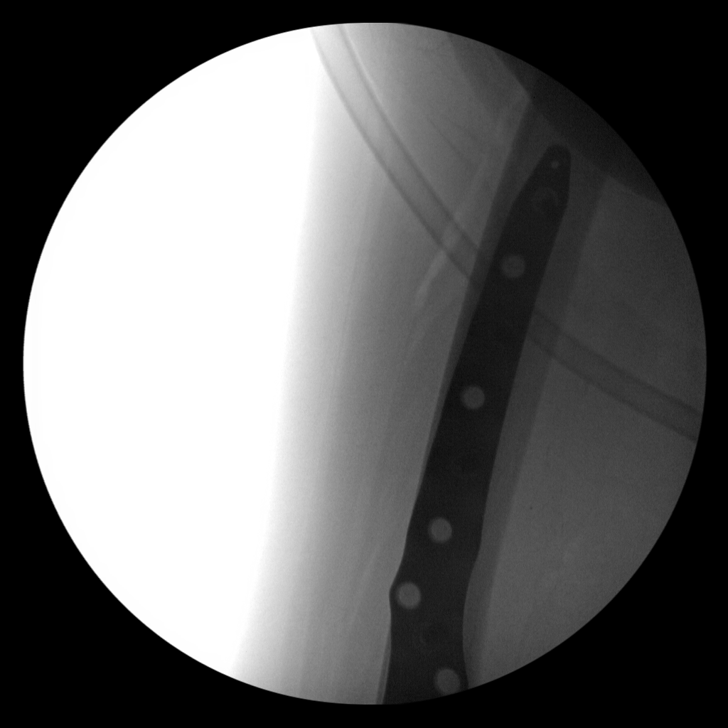

[8 of 8 positions shown; findings below may reference images not displayed]

FLUOROSCOPY TIME:  2 minutes 12 seconds

Images obtained: 8 intraoperatively, 3 postoperatively
FINDINGS: RIGHT femur 8 views:

Digital C-arm fluoroscopic images obtained intraoperatively
demonstrate sequential reduction of previously identified distal
RIGHT femoral oblique metadiaphyseal fracture and placement of 2
cannulated screws.

Additionally, a lateral plate and multiple screws was placed
extending from the proximal to mid RIGHT femoral diaphysis to the
lateral femoral condyle. Knee joint alignment normal.

RIGHT knee two views: Images obtained postoperatively portably in
recovery demonstrate lateral plate and multiple screws across a
reduced oblique fracture of the distal RIGHT femoral diaphysis. An
additional nondisplaced fracture plane seen extending into the mid
femoral diaphysis.

Knee joint alignment normal no degenerative changes and knee joint
are present.

Hip joint space is preserved.

There is no evidence of fracture or other focal bone lesions. Soft
tissues are unremarkable.
IMPRESSION: Intraoperative and postoperative images of the RIGHT femur
demonstrating ORIF for previously identified distal RIGHT femur
fracture.

Note is made of an additional oblique fracture plane extending into
the mid to distal RIGHT femoral diaphysis, better visualized than on
preoperative exam.

## 2019-11-19 IMAGING — DX DG CHEST 2V
2 series · 2 of 2 positions shown · non-contrast
Comparison: 03/17/2018, 03/15/2017

CLINICAL DATA: Shortness of breath with cough

EXAM:
CHEST - 2 VIEW

[chest ap]
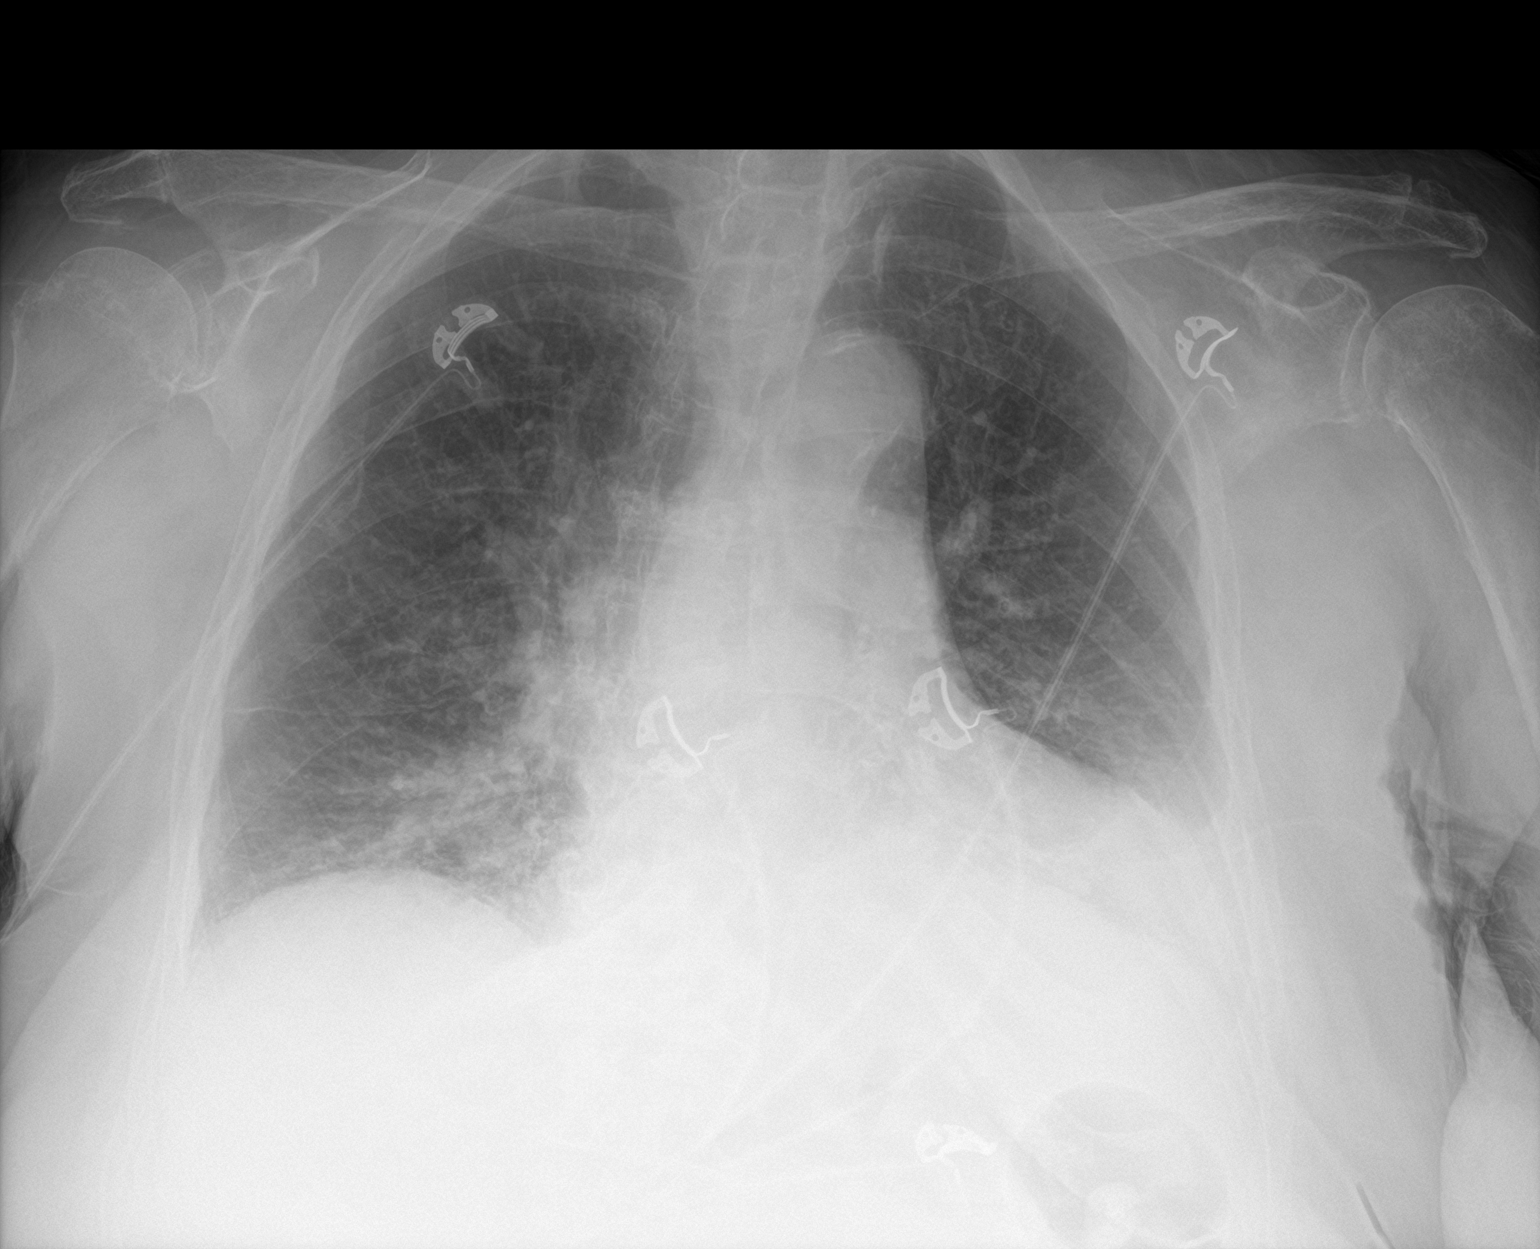

[chest lat]
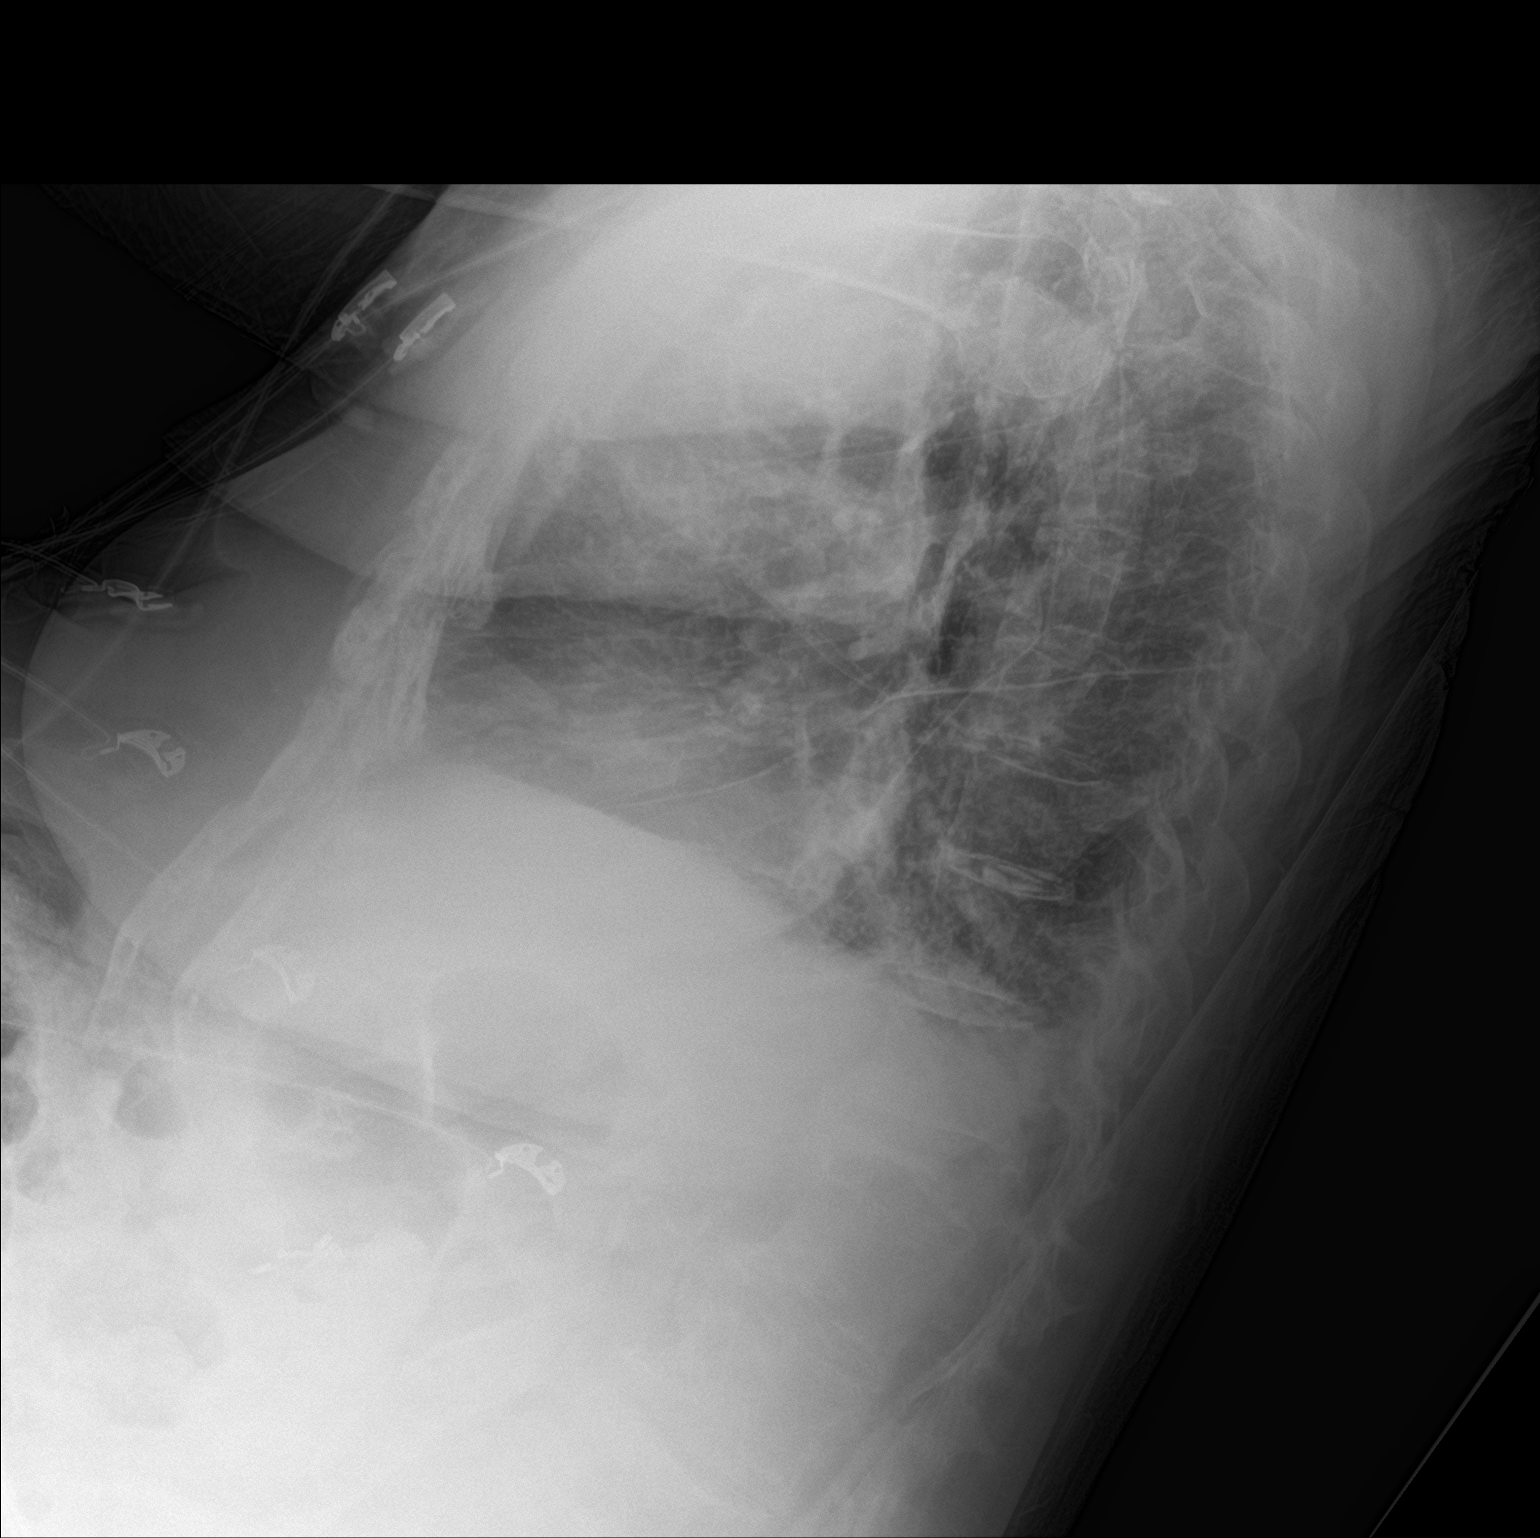

[2 of 2 positions shown; findings below may reference images not displayed]

FINDINGS: Small pleural effusions. Cardiomegaly with vascular congestion.
Patchy atelectasis or infiltrates at the left greater than right
lung base. Aortic atherosclerosis. No pneumothorax.
IMPRESSION: 1. Small pleural effusions with patchy left greater than right
basilar atelectasis or infiltrates
2. Cardiomegaly with vascular congestion

## 2020-10-04 ENCOUNTER — Telehealth: Payer: Medicare HMO | Admitting: Family Medicine

## 2020-10-21 ENCOUNTER — Telehealth: Payer: Self-pay

## 2020-11-01 ENCOUNTER — Ambulatory Visit (INDEPENDENT_AMBULATORY_CARE_PROVIDER_SITE_OTHER): Payer: Medicare Other | Admitting: Family Medicine

## 2020-11-01 ENCOUNTER — Other Ambulatory Visit: Payer: Self-pay

## 2020-11-01 ENCOUNTER — Encounter: Payer: Self-pay | Admitting: Family Medicine

## 2020-11-01 VITALS — BP 108/63 | HR 85 | Temp 97.3°F

## 2020-11-01 DIAGNOSIS — J449 Chronic obstructive pulmonary disease, unspecified: Secondary | ICD-10-CM | POA: Diagnosis not present

## 2020-11-01 DIAGNOSIS — I5032 Chronic diastolic (congestive) heart failure: Secondary | ICD-10-CM | POA: Diagnosis not present

## 2020-11-01 DIAGNOSIS — I700 Atherosclerosis of aorta: Secondary | ICD-10-CM

## 2020-11-01 DIAGNOSIS — R159 Full incontinence of feces: Secondary | ICD-10-CM

## 2020-11-01 DIAGNOSIS — Z741 Need for assistance with personal care: Secondary | ICD-10-CM

## 2020-11-01 DIAGNOSIS — L89151 Pressure ulcer of sacral region, stage 1: Secondary | ICD-10-CM

## 2020-11-01 DIAGNOSIS — D649 Anemia, unspecified: Secondary | ICD-10-CM

## 2020-11-01 DIAGNOSIS — I10 Essential (primary) hypertension: Secondary | ICD-10-CM

## 2020-11-01 DIAGNOSIS — K5981 Ogilvie syndrome: Secondary | ICD-10-CM

## 2020-11-01 DIAGNOSIS — R32 Unspecified urinary incontinence: Secondary | ICD-10-CM

## 2020-11-01 DIAGNOSIS — N182 Chronic kidney disease, stage 2 (mild): Secondary | ICD-10-CM

## 2020-11-01 DIAGNOSIS — E785 Hyperlipidemia, unspecified: Secondary | ICD-10-CM

## 2020-11-01 DIAGNOSIS — R531 Weakness: Secondary | ICD-10-CM

## 2020-11-01 DIAGNOSIS — F0390 Unspecified dementia without behavioral disturbance: Secondary | ICD-10-CM

## 2020-11-01 MED ORDER — ZINC OXIDE 20 % EX OINT: 1.0000 "application " | TOPICAL_OINTMENT | CUTANEOUS | 0 refills | Status: DC | PRN

## 2020-11-01 NOTE — Progress Notes (Signed)
New Patient Office Visit  Subjective:  Patient ID: Carrie Crawford, female    DOB: 20-Nov-1925  Age: 87 y.o. MRN: [id number]  CC:  Chief Complaint  Patient presents with  . Establish Care   Here with son who provided the history.   HPI Carrie Crawford presents to re-establish care. She was previously a patient until until be was hospitalized in October of 2019 for pneumonia. Following that she was admitted to a SNF, where she has been until this past Monday. She is now living with her son [name]. He is her POA. She is a DNR. There a few different CNAs that have been coming in to care to [facility]. She has a history of dementia. She is incontinent of bowel and bladder. She is bed bound. They have a lift at home. They also have a wheelchair and hospital bed. She is on 2.25L at [facility]. 02 sats have been 97-99% at home. 2.25L . Denies chest pain, shortness of breath. She has peripheral edema at baseline. She has 2 small pressure ulcers on her bottom. They have been using barrier cream and repositioning. The CNAs report that they have been improving. [name] will try to send pictures of her wounds via mychart as it will be too difficulty to see today in the office since she is chairbound. Denies signs of infections. She has been eating and drinking well. Denies choking or difficulty swallowing. 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 Denies pain or vomiting. She has peripheral edema at baseline. They have been wrapping and elevating her legs at home.    Past Medical History:  Diagnosis Date  . Arthritis    "spine" (03/16/2018)  . CHF (congestive heart failure) ([place])   . Chronic bronchitis ([place])   . Chronic kidney disease   . Compression fracture of lumbar vertebra ([place])   . COPD (chronic obstructive pulmonary disease) ([place])   . Dyslipidemia   . Hypertension   . Multiple allergies   . Osteopenia   . Rhinitis, chronic     Past Surgical History:  Procedure Laterality Date  .  CATARACT EXTRACTION W/ INTRAOCULAR LENS  IMPLANT, BILATERAL Bilateral   . CHOLECYSTECTOMY    . FRACTURE SURGERY    . OPEN REDUCTION INTERNAL FIXATION (ORIF) DISTAL RADIAL FRACTURE Right 03/16/2018  . ORIF FEMUR FRACTURE Right 03/16/2018   Procedure: OPEN REDUCTION INTERNAL FIXATION (ORIF) DISTAL FEMUR FRACTURE;  Surgeon: Shona Needles, MD;  Location: [facility];  Service: Orthopedics;  Laterality: Right;  . THYROIDECTOMY, PARTIAL  2004   [facility]    Family History  Problem Relation Age of Onset  . Heart disease Brother   . Cancer Daughter        endometial/uterine cancer(nodule in right lung)    Social History   Socioeconomic History  . Marital status: Widowed    Spouse name: Not on file  . Number of children: Not on file  . Years of education: Not on file  . Highest education level: Not on file  Occupational History  . Not on file  Tobacco Use  . Smoking status: Never Smoker  . Smokeless tobacco: Never Used  Vaping Use  . Vaping Use: Never used  Substance and Sexual Activity  . Alcohol use: No    Alcohol/week: 0.0 standard drinks  . Drug use: No  . Sexual activity: Not Currently  Other Topics Concern  . Not on file  Social History Narrative  . Not on file   Social  Determinants of Health   Financial Resource Strain: Not on file  Food Insecurity: Not on file  Transportation Needs: Not on file  Physical Activity: Not on file  Stress: Not on file  Social Connections: Not on file  Intimate Partner Violence: Not on file    ROS Review of Systems As per HPI.   Objective:   Today's Vitals: BP 108/63   Pulse 85   Temp (!) 97.3 F (36.3 C) (Temporal)   SpO2 96% Comment: on 2L via [place]  Physical Exam Vitals and nursing note reviewed.  Constitutional:      General: She is not in acute distress.    Appearance: She is not ill-appearing, toxic-appearing or diaphoretic.  HENT:     Head: Normocephalic and atraumatic.  Cardiovascular:     Rate and Rhythm: Normal rate and  regular rhythm.     Heart sounds: Normal heart sounds. No murmur heard.   Pulmonary:     Effort: Pulmonary effort is normal. No respiratory distress.     Breath sounds: Normal breath sounds. No wheezing.  Musculoskeletal:     Right lower leg: 2+ Pitting Edema present.     Left lower leg: 2+ Pitting Edema present.  Skin:    General: Skin is warm and dry.  Neurological:     Mental Status: Mental status is at baseline.     Motor: Weakness (generalized) present.     Gait: Gait abnormal (chair bound).  Psychiatric:        Behavior: Behavior is not agitated or aggressive. Behavior is cooperative.        Cognition and Memory: Cognition is impaired. Memory is impaired.     Assessment & Plan:   Adonai was seen today for establish care.  Diagnoses and all orders for this visit:  Primary hypertension Well controlled on current regimen. Referrals as below. Labs stable.  -     Ambulatory referral to [facility] with Differential/Platelet -     CMP14+EGFR -     AMB Referral to [facility]  Chronic diastolic heart failure ([place]) Labs stable as below. Referrals as below.  -     Ambulatory referral to [facility] with Differential/Platelet -     CMP14+EGFR -     AMB Referral to [facility]  Aortic atherosclerosis ([place]) Labs stable.  -     CBC with Differential/Platelet -     CMP14+EGFR  Obstructive chronic bronchitis without exacerbation ([place]) On 02 via [place]. Labs stable. Referrals placed.  -     Ambulatory referral to [place] with Differential/Platelet -     CMP14+EGFR -     AMB Referral to [facility]  Hyperlipidemia with target LDL less than 130 Diet controlled. -     CBC with Differential/Platelet -     CMP14+EGFR  Chronic kidney disease, stage 2 (mild) Stable. Referral as below.  -     CBC with Differential/Platelet -     CMP14+EGFR -     AMB Referral to [facility]  Dementia without behavioral disturbance, unspecified dementia type ([place]) Referrals as below. Bedbound.  -     Ambulatory referral to [facility] with Differential/Platelet -     CMP14+EGFR -     [facility]B [facility]  Anemia, unspecified type Stable.  -  CBC with Differential/Platelet -     CMP14+EGFR  Requires assistance with activities of daily living (ADL) Bed bound, incontinence, dementia.  -     Ambulatory referral to [facility] [facility] and bladder incontinence Orders faxed for supplies. Use barrier creams and reposition frequently.  -     Ambulatory referral to Home Health  Generalized weakness -     Ambulatory referral to [facility] with Differential/Platelet -     CMP14+EGFR  Pressure injury of sacral region, stage 1 Son will try to send pictures via mychart. Barrier cream, frequent repositions. Improving per report.  -     Ambulatory referral to [facility] -     zinc oxide (MEIJER ZINC OXIDE) 20 % ointment; Apply 1 application topically as needed for irritation.  Ogilvie syndrome Chronic distention and diarrhea. Denies fever or pain. No tenderness on exam.    Follow-up: Return in about 3 months (around 01/31/2021) for video visit for chronic follow up . Sooner for new or worsening symptoms.   The patient indicates understanding of these issues and agrees with the plan.   [name], FNP

## 2020-11-01 NOTE — Patient Instructions (Signed)
Preventing Pressure Injuries  A pressure injury, sometimes called a bedsore or a pressure ulcer, is an injury to the skin and underlying tissue caused by pressure. A pressure injury can happen when your skin presses against a surface, such as a mattress or wheelchair seat, for too long. The pressure on the blood vessels causes reduced blood flow to your skin. This can eventually cause the skin tissue to die and break down into a wound. Pressure injuries usually develop:  Over bony parts of the body, such as the tailbone, shoulders, elbows, hips, and heels.  Under medical devices, such as respiratory equipment, stockings, tubes, and splints. How can this condition affect me? Pressure injuries are caused by a lack of blood supply to an area of skin. These injuries begin as a reddened area on the skin and can become an open sore. They can result from intense pressure over a short period of time or from less pressure over a long period of time. Pressure injuries can vary in severity. They can cause pain, muscle damage, and infection. What can increase my risk? This condition is more likely to develop in people who:  Are in the hospital or an extended care facility.  Are bedridden or in a wheelchair.  Have an injury or disease that keeps them from: ? Moving normally. ? Feeling pain or pressure. ? Communicating if they feel pain or pressure.  Have a condition that: ? Makes them sleepy or less alert. ? Causes poor blood flow.  Need to wear a medical device.  Have poor control of their bladder or bowel functions (incontinence).  Have poor nutrition (malnutrition).  Have had this condition before.  Are of certain ethnicities. People of African American, Latino, or Hispanic descent are at higher risk compared to other ethnic groups. What actions can I take to prevent pressure injuries? Reducing and redistributing pressure  Do not lie or sit in one position for a long time. Move or change  position: ? Every hour when out of bed in a chair. ? Every two hours when in bed. ? As often as told by your health care provider.  Use pillows, wedges, or cushions to redistribute pressure. Ask your health care provider to recommend a mattress, cushions, or pads for you.  Use medical devices that do not rub your skin. Tell your health care provider if one of your medical devices is causing pain or irritation. Skin care If you are in the hospital, your health care providers:  Will inspect your skin, including areas under or around medical devices, at least twice a day.  May recommend that you use certain types of bedding to help prevent pressure injuries. These may include a pad, mattress, or chair cushion that is filled with gel, air, water, or foam.  Will evaluate your nutrition and consult a dietitian if needed.  Will inspect and change any wound dressings regularly.  May help you move into different positions every few hours.  Will adjust any medical devices and braces as needed to limit pressure on your skin.  Will keep your skin clean and dry.  May use gentle cleansers and skin protectants if you are incontinent.  Will moisturize any dry skin. In general, at home:  Keep your skin clean and dry. Gently pat your skin dry.  Do not rub or massage bony areas of your skin.  Moisturize dry skin.  Use gentle cleansers and skin protectants routinely if you are incontinent.  Check your skin at least once  a day for any changes in color and for any new blisters or sores. Make sure to check under and around any medical devices and between skin folds. Have a caregiver do this for you if you are not able.   Lifestyle  Be as active as you can every day. Ask your health care provider to suggest safe exercises or activities.  Do not abuse drugs or alcohol.  Do not use any products that contain nicotine or tobacco, such as cigarettes, e-cigarettes, and chewing tobacco. If you need  help quitting, ask your health care provider. General instructions  Take over-the-counter and prescription medicines only as told by your health care provider.  Work with your health care provider to manage any chronic health conditions.  Eat a healthy diet that includes protein, vitamins, and minerals. Ask your health care provider what types of food you should eat.  Drink enough fluid to keep your urine pale yellow.  Keep all follow-up visits as told by your health care provider. This is important.   Contact a health care provider if you:  Feel or see any changes in your skin. Summary  A pressure injury, sometimes called a bedsore or a pressure ulcer, is an injury to the skin and underlying tissue caused by pressure.  Do not lie or sit in one position for a long time.  Check your skin at least once a day for any changes in color and for any new blisters or sores.  Make sure to check under and around any medical devices and between skin folds. Have a caregiver do this for you if you are not able.  Eat a healthy diet that includes protein, vitamins, and minerals. Ask your health care provider what types of food you should eat. This information is not intended to replace advice given to you by your health care provider. Make sure you discuss any questions you have with your health care provider. Document Revised: 10/21/2018 Document Reviewed: 03/22/2018 Elsevier Patient Education  2021 [place], Adult Taking care of your wound properly can help to prevent pain, infection, and scarring. It can also help your wound heal more quickly. Follow instructions from your health care provider about how to care for your wound. Supplies needed:  Soap and water.  Wound cleanser.  Gauze.  If needed, a clean bandage (dressing) or other type of wound dressing material to cover or place in the wound. Follow your health care provider's instructions about what dressing supplies to  use.  Cream or ointment to apply to the wound, if told by your health care provider. How to care for your wound Cleaning the wound Ask your health care provider how to clean the wound. This may include:  Using mild soap and water or a wound cleanser.  Using a clean gauze to pat the wound dry after cleaning it. Do not rub or scrub the wound. Dressing care  Wash your hands with soap and water for at least 20 seconds before and after you change the dressing. If soap and water are not available, use hand sanitizer.  Change your dressing as told by your health care provider. This may include: ? Cleaning or rinsing out (irrigating) the wound. ? Placing a dressing over the wound or in the wound (packing). ? Covering the wound with an outer dressing.  Leave any stitches (sutures), skin glue, or adhesive strips in place. These skin closures may need to stay in place for 2 weeks or longer. If adhesive  strip edges start to loosen and curl up, you may trim the loose edges. Do not remove adhesive strips completely unless your health care provider tells you to do that.  Ask your health care provider when you can leave the wound uncovered. Checking for infection Check your wound area every day for signs of infection. Check for:  More redness, swelling, or pain.  Fluid or blood.  Warmth.  Pus or a bad smell.   Follow these instructions at home Medicines  If you were prescribed an antibiotic medicine, cream, or ointment, take or apply it as told by your health care provider. Do not stop using the antibiotic even if your condition improves.  If you were prescribed pain medicine, take it 30 minutes before you do any wound care or as told by your health care provider.  Take over-the-counter and prescription medicines only as told by your health care provider. Eating and drinking  Eat a diet that includes protein, vitamin A, vitamin C, and other nutrient-rich foods to help the wound  heal. ? Foods rich in protein include meat, fish, eggs, dairy, beans, and nuts. ? Foods rich in vitamin A include carrots and dark green, leafy vegetables. ? Foods rich in vitamin C include citrus fruits, tomatoes, broccoli, and peppers.  Drink enough fluid to keep your urine pale yellow. General instructions  Do not take baths, swim, use a hot tub, or do anything that would put the wound underwater until your health care provider approves. Ask your health care provider if you may take showers. You may only be allowed to take sponge baths.  Do not scratch or pick at the wound. Keep it covered as told by your health care provider.  Return to your normal activities as told by your health care provider. Ask your health care provider what activities are safe for you.  Protect your wound from the sun when you are outside for the first 6 months, or for as long as told by your health care provider. Cover up the scar area or apply sunscreen that has an SPF of at least 72.  Do not use any products that contain nicotine or tobacco, such as cigarettes, e-cigarettes, and chewing tobacco. These may delay wound healing. If you need help quitting, ask your health care provider.  Keep all follow-up visits as told by your health care provider. This is important. Contact a health care provider if:  You received a tetanus shot and you have swelling, severe pain, redness, or bleeding at the injection site.  Your pain is not controlled with medicine.  You have any of these signs of infection: ? More redness, swelling, or pain around the wound. ? Fluid or blood coming from the wound. ? Warmth coming from the wound. ? Pus or a bad smell coming from the wound. ? A fever or chills.  You are nauseous or you vomit.  You are dizzy. Get help right away if:  You have a red streak of skin near the area around your wound.  Your wound has been closed with staples, sutures, skin glue, or adhesive strips and it  begins to open up and separate.  Your wound is bleeding, and the bleeding does not stop with gentle pressure.  You have a rash.  You faint.  You have trouble breathing. These symptoms may represent a serious problem that is an emergency. Do not wait to see if the symptoms will go away. Get medical help right away. Call your local emergency  services (911 in the [place] Do not drive yourself to the hospital. Summary  Always wash your hands with soap and water for at least 20 seconds before and after changing your dressing.  Change your dressing as told by your health care provider.  To help with healing, eat foods that are rich in protein, vitamin A, vitamin C, and other nutrients.  Check your wound every day for signs of infection. Contact your health care provider if you suspect that your wound is infected. This information is not intended to replace advice given to you by your health care provider. Make sure you discuss any questions you have with your health care provider. Document Revised: 04/14/2019 Document Reviewed: 04/14/2019 Elsevier Patient Education  2021 [employer].

## 2020-11-02 LAB — CMP14+EGFR
ALT: 5 IU/L (ref 0–32)
AST: 12 IU/L (ref 0–40)
Albumin/Globulin Ratio: 0.8 — ABNORMAL LOW (ref 1.2–2.2)
Albumin: 3.1 g/dL — ABNORMAL LOW (ref 3.5–4.6)
Alkaline Phosphatase: 73 IU/L (ref 44–121)
BUN/Creatinine Ratio: 16 (ref 12–28)
BUN: 12 mg/dL (ref 10–36)
Bilirubin Total: <0.2 mg/dL (ref 0.0–1.2)
CO2: 26 mmol/L (ref 20–29)
Calcium: 8.3 mg/dL — ABNORMAL LOW (ref 8.7–10.3)
Chloride: 95 mmol/L — ABNORMAL LOW (ref 96–106)
Creatinine, Ser: 0.77 mg/dL (ref 0.57–1.00)
Globulin, Total: 3.7 g/dL (ref 1.5–4.5)
Glucose: 123 mg/dL — ABNORMAL HIGH (ref 65–99)
Potassium: 4.2 mmol/L (ref 3.5–5.2)
Sodium: 134 mmol/L (ref 134–144)
Total Protein: 6.8 g/dL (ref 6.0–8.5)
eGFR: 72 mL/min/{1.73_m2} (ref >59)

## 2020-11-02 LAB — CBC WITH DIFFERENTIAL/PLATELET
Basophils Absolute: 0 10*3/uL (ref 0.0–0.2)
Basos: 1 %
EOS (ABSOLUTE): 0.2 10*3/uL (ref 0.0–0.4)
Eos: 3 %
Hematocrit: 32.5 % — ABNORMAL LOW (ref 34.0–46.6)
Hemoglobin: 10.8 g/dL — ABNORMAL LOW (ref 11.1–15.9)
Immature Grans (Abs): 0 10*3/uL (ref 0.0–0.1)
Immature Granulocytes: 1 %
Lymphocytes Absolute: 2.3 10*3/uL (ref 0.7–3.1)
Lymphs: 32 %
MCH: 30.6 pg (ref 26.6–33.0)
MCHC: 33.2 g/dL (ref 31.5–35.7)
MCV: 92 fL (ref 79–97)
Monocytes Absolute: 0.7 10*3/uL (ref 0.1–0.9)
Monocytes: 9 %
Neutrophils Absolute: 4 10*3/uL (ref 1.4–7.0)
Neutrophils: 54 %
Platelets: 293 10*3/uL (ref 150–450)
RBC: 3.53 x10E6/uL — ABNORMAL LOW (ref 3.77–5.28)
RDW: 13.3 % (ref 11.7–15.4)
WBC: 7.3 10*3/uL (ref 3.4–10.8)

## 2020-11-04 ENCOUNTER — Telehealth: Payer: Self-pay | Admitting: *Deleted

## 2020-11-04 NOTE — Chronic Care Management (AMB) (Signed)
  Chronic Care Management   Note  11/04/2020 Name: ROBY SPALLA MRN: [id number] DOB: [birth year]  GENAE STRINE is a 87 y.o. year old female who is a primary care patient of [name], FNP. I reached out to Danella Maiers by phone today in response to a referral sent by Ms. Ofilia Neas [place]'s PCP, [name], FNP.     Ms. Sciara was given information about Chronic Care Management services today including:  1. CCM service includes personalized support from designated clinical staff supervised by her physician, including individualized plan of care and coordination with other care providers 2. 24/7 contact phone numbers for assistance for urgent and routine care needs. 3. Service will only be billed when office clinical staff spend 20 minutes or more in a month to coordinate care. 4. Only one practitioner may furnish and bill the service in a calendar month. 5. The patient may stop CCM services at any time (effective at the end of the month) by phone call to the office staff. 6. The patient will be responsible for cost sharing (co-pay) of up to 20% of the service fee (after annual deductible is met).  Son POA [name] Loya verbally agreed to assistance and services provided by embedded care coordination/care management team today.  Follow up plan: Telephone appointment with care management team member scheduled for:11/07/2020  Lailani Tool  Care Guide, Embedded Care Coordination [place]  Care Management

## 2020-11-07 ENCOUNTER — Ambulatory Visit (INDEPENDENT_AMBULATORY_CARE_PROVIDER_SITE_OTHER): Payer: Medicare Other | Admitting: *Deleted

## 2020-11-07 DIAGNOSIS — I10 Essential (primary) hypertension: Secondary | ICD-10-CM | POA: Diagnosis not present

## 2020-11-07 DIAGNOSIS — I5032 Chronic diastolic (congestive) heart failure: Secondary | ICD-10-CM

## 2020-11-07 NOTE — Chronic Care Management (AMB) (Signed)
Chronic Care Management   CCM RN Visit Note  11/07/2020 Name: Carrie Crawford MRN: [id number] DOB: Dec 26, 1925  Subjective: Carrie Crawford is a 87 y.o. year old female who is a primary care patient of Carrie Perking, FNP. The care management team was consulted for assistance with disease management and care coordination needs.    Engaged with patient's son, Carrie Crawford, by telephone for initial visit in response to provider referral for case management and/or care coordination services.   Consent to Services:  The patient was given the following information about Chronic Care Management services today, agreed to services, and gave verbal consent: 1. CCM service includes personalized support from designated clinical staff supervised by the primary care provider, including individualized plan of care and coordination with other care providers 2. 24/7 contact phone numbers for assistance for urgent and routine care needs. 3. Service will only be billed when office clinical staff spend 20 minutes or more in a month to coordinate care. 4. Only one practitioner may furnish and bill the service in a calendar month. 5.The patient may stop CCM services at any time (effective at the end of the month) by phone call to the office staff. 6. The patient will be responsible for cost sharing (co-pay) of up to 20% of the service fee (after annual deductible is met). Patient agreed to services and consent obtained.  Patient agreed to services and verbal consent obtained.   Assessment: Review of patient past medical history, allergies, medications, health status, including review of consultants reports, laboratory and other test data, was performed as part of comprehensive evaluation and provision of chronic care management services.   SDOH (Social Determinants of Health) assessments and interventions performed:    CCM Care Plan  No Known Allergies  Outpatient Encounter Medications as of 11/07/2020  Medication Sig   . albuterol (PROVENTIL) (2.5 MG/3ML) 0.083% nebulizer solution Take 2.5 mg by nebulization every 4 (four) hours as needed.  Marland Kitchen aspirin 81 MG tablet Take 81 mg by mouth daily.  Marland Kitchen azelastine (ASTELIN) 0.1 % nasal spray 2 sprays in each nostril at bedtime.  . colestipol (COLESTID) 1 g tablet Take 1 g by mouth 2 (two) times daily.  Marland Kitchen dextromethorphan-guaiFENesin (MUCINEX DM) 30-600 MG 12hr tablet Take 1 tablet by mouth 2 (two) times daily.  Marland Kitchen donepezil (ARICEPT) 5 MG tablet Take 5 mg by mouth at bedtime.  . fluticasone (FLONASE) 50 MCG/ACT nasal spray 2 SPRAYS IN EACH NOSTRIL QD  . furosemide (LASIX) 40 MG tablet Take 40 mg by mouth daily.  Marland Kitchen losartan (COZAAR) 100 MG tablet Take 1 tablet (100 mg total) by mouth daily.  . potassium chloride SA (K-DUR,KLOR-CON) 20 MEQ tablet Take 40 mEq by mouth 2 (two) times daily.  . TRELEGY ELLIPTA 100-62.5-25 MCG/INH AEPB Inhale 1 puff into the lungs daily.  Marland Kitchen zinc oxide (MEIJER ZINC OXIDE) 20 % ointment Apply 1 application topically as needed for irritation.   No facility-administered encounter medications on file as of 11/07/2020.    Patient Active Problem List   Diagnosis Date Noted  . Chronic kidney disease, stage 2 (mild) 11/01/2020  . Dementia ([place]) 02/08/2019  . 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 Aortic atherosclerosis ([place]) 04/14/2018  . Right femoral fracture ([place]) 03/15/2018  . Anemia 03/15/2018  . Hyponatremia 03/15/2018  . Hyperglycemia 04/29/2017  . Obesity (BMI 30-39.9) 02/17/2017  . OAB (overactive bladder) 02/16/2017  . Age-related osteoporosis with current pathological fracture 04/23/2016  . Estrogen deficiency 04/23/2016  . Fracture of posterior  thoracic vertebral body with routine healing 04/23/2016  . Diastolic heart failure ([place]) 02/17/2016  . Hyperlipidemia with target LDL less than 130   . Essential hypertension 04/26/2013  . Rhinitis, chronic 01/16/2011  . Obstructive chronic bronchitis without exacerbation ([place]) 01/16/2011     Conditions to be addressed/monitored:CHF, HTN, CKD Stage 2 and Dementia  Care Plan : RNCM: General Plan of Care (Adult)  Updates made by Ilean China, RN since 11/07/2020 12:00 AM    Problem: Quality of Life   Priority: High    Long-Range Goal: Quality of Life Maintained   Start Date: 11/07/2020  This Visit's Progress: On track  Priority: High  Note:   Current Barriers:  . Chronic Disease Management support and education needs related to dementia, heart failure, hypertension, and CKD . Cognitive Deficits . Unable to perform ADLs independently . Unable to perform IADLs independently  Nurse Case Manager Clinical Goal(s):  . patient will work with PCP to address needs related to medical management of chronic medical conditions . patient will meet with RN Care Manager to address self-management of chronic medical conditions . Patient will experience desired quality of life since returning home  Interventions:  . 1:1 collaboration with Carrie Perking, FNP regarding development and update of comprehensive plan of care as evidenced by provider attestation and co-signature . Inter-disciplinary care team collaboration (see longitudinal plan of care) . Chart reviewed including relevant office notes, referral notes, and lab results . Reviewed and discussed medications with son, Carrie Crawford o Patient is compliant with medications and affordability is not an issue . Discussed family/social support o Lives wither her son, Carrie Crawford. He provides primary care but they have 4 aides that rotate providing care daily. o Other children and extended family are supportive as well . Discussed recent discharge from SNF o Patient and son wanted her to live back at home after being in SNF for a little over 2 years . Discussed mobility and ability to perform ADLs o Bed-bound and dependent on others for ADLs and IADLs o Has a hospital bed, wheelchair, and hoyer lift o They have transportation arranged to  take her to appointments o [place], PT, and OT has been ordered. Nursing coming out for evaluation this weekend.  . Reinforced need to change positions at least every two hours to prevent skin breakdown.  o She does have a few small places on her buttocks but they are healing o Son and aids are putting a wedge under one side to relieve pressure. Changing positions often.  . Discussed diet and appetite o Meals are prepared for her. Appetite is good and she doesn't have any difficulty eating.  . Assessed for additional resource needs o None at this time . Assessed for caregiver strain o Things are going well . Discussed plans with patient for ongoing care management follow up and provided patient with direct contact information for care management team . Provided with RN Care Manager contact number and encouraged to reach out as needed  Self Care Activities:  . Unable to perform ADLs or IADLs independently   Patient Goals Over the next 30 days, patient will: . make shared treatment decisions with doctor . spend time outdoors at least 3 times a week  . Take mediations as prescribed . Change positions at least every two hours to prevent skin breakdown . Work with [place], PT, and OT to improve strength . Call PCP with any new or worsening conditions . Reach out to  RN Care Manager as needed [phone]    Follow Up Plan:  . Telephone follow up appointment with care management team member scheduled for: 11/29/20 with RNCM . The patient has been provided with contact information for the care management team and has been advised to call with any health related questions or concerns.   [name], BSN, RN-BC Embedded Chronic Care Manager [facility] [place] Management Direct Dial: [phone]

## 2020-11-07 NOTE — Patient Instructions (Signed)
Visit Information   PATIENT GOALS:  Goals Addressed            This Visit's Progress   . [place] My Quality of Life       Timeframe:  Long-Range Goal Priority:  High Start Date:  11/07/20                           Expected End Date:                       Follow Up Date 11/29/20    . make shared treatment decisions with doctor . spend time outdoors at least 3 times a week  . Take mediations as prescribed . Change positions at least every two hours to prevent skin breakdown . Work with [place], PT, and OT to improve strength . Call PCP with any new or worsening conditions . Reach out to RN Care Manager as needed [phone]   Why is this important?    Having a long-term illness can be scary.   It can also be stressful for you and your caregiver.   These steps may help.    Notes:        Consent to CCM Services: Carrie Crawford was given information about Chronic Care Management services today including:  1. CCM service includes personalized support from designated clinical staff supervised by her physician, including individualized plan of care and coordination with other care providers 2. 24/7 contact phone numbers for assistance for urgent and routine care needs. 3. Service will only be billed when office clinical staff spend 20 minutes or more in a month to coordinate care. 4. Only one practitioner may furnish and bill the service in a calendar month. 5. The patient may stop CCM services at any time (effective at the end of the month) by phone call to the office staff. 6. The patient will be responsible for cost sharing (co-pay) of up to 20% of the service fee (after annual deductible is met).  Patient agreed to services and verbal consent obtained.   Patient verbalizes understanding of instructions provided today and agrees to view in [name].   Follow Up Plan:  . Telephone follow up appointment with care management team member scheduled for: 11/29/20 with  RNCM . The patient has been provided with contact information for the care management team and has been advised to call with any health related questions or concerns.   [name], BSN, RN-BC Embedded Chronic Care Manager [facility] Management Direct Dial: [phone]   CLINICAL CARE PLAN: Patient Care Plan: RNCM: General Plan of Care (Adult)    Problem Identified: Quality of Life   Priority: High    Long-Range Goal: Quality of Life Maintained   Start Date: 11/07/2020  This Visit's Progress: On track  Priority: High  Note:   Current Barriers:  . Chronic Disease Management support and education needs related to dementia, heart failure, hypertension, and CKD . Cognitive Deficits . Unable to perform ADLs independently . Unable to perform IADLs independently  Nurse Case Manager Clinical Goal(s):  . patient will work with PCP to address needs related to medical management of chronic medical conditions . patient will meet with RN Care Manager to address self-management of chronic medical conditions . Patient will experience desired quality of life since returning home  Interventions:  . 1:1 collaboration with [name], FNP regarding development and update  of comprehensive plan of care as evidenced by provider attestation and co-signature . Inter-disciplinary care team collaboration (see longitudinal plan of care) . Chart reviewed including relevant office notes, referral notes, and lab results . Reviewed and discussed medications with son, [name] o Patient is compliant with medications and affordability is not an issue . Discussed family/social support o Lives wither her son, [name]. He provides primary care but they have 4 aides that rotate providing care daily. o Other children and extended family are supportive as well . Discussed recent discharge from SNF o Patient and son wanted her to live back at home after being in SNF for a little  over 2 years . Discussed mobility and ability to perform ADLs o Bed-bound and dependent on others for ADLs and IADLs o Has a hospital bed, wheelchair, and hoyer lift o They have transportation arranged to take her to appointments o [place], PT, and OT has been ordered. Nursing coming out for evaluation this weekend.  . Reinforced need to change positions at least every two hours to prevent skin breakdown.  o She does have a few small places on her buttocks but they are healing o Son and aids are putting a wedge under one side to relieve pressure. Changing positions often.  . Discussed diet and appetite o Meals are prepared for her. Appetite is good and she doesn't have any difficulty eating.  . Assessed for additional resource needs o None at this time . Assessed for caregiver strain o Things are going well . Discussed plans with patient for ongoing care management follow up and provided patient with direct contact information for care management team . Provided with RN Care Manager contact number and encouraged to reach out as needed  Self Care Activities:  . Unable to perform ADLs or IADLs independently   Patient Goals Over the next 30 days, patient will: . make shared treatment decisions with doctor . spend time outdoors at least 3 times a week  . Take mediations as prescribed . Change positions at least every two hours to prevent skin breakdown . Work with [place], PT, and OT to improve strength . Call PCP with any new or worsening conditions . Reach out to RN Care Manager as needed [phone]  Follow Up Plan:  . Telephone follow up appointment with care management team member scheduled for: 11/29/20 with RNCM . The patient has been provided with contact information for the care management team and has been advised to call with any health related questions or concerns.

## 2020-11-19 ENCOUNTER — Ambulatory Visit (INDEPENDENT_AMBULATORY_CARE_PROVIDER_SITE_OTHER): Payer: Medicare Other

## 2020-11-19 ENCOUNTER — Other Ambulatory Visit: Payer: Self-pay

## 2020-11-19 DIAGNOSIS — I5032 Chronic diastolic (congestive) heart failure: Secondary | ICD-10-CM

## 2020-11-19 DIAGNOSIS — Z9981 Dependence on supplemental oxygen: Secondary | ICD-10-CM

## 2020-11-19 DIAGNOSIS — Z7951 Long term (current) use of inhaled steroids: Secondary | ICD-10-CM

## 2020-11-19 DIAGNOSIS — F0390 Unspecified dementia without behavioral disturbance: Secondary | ICD-10-CM

## 2020-11-19 DIAGNOSIS — I130 Hypertensive heart and chronic kidney disease with heart failure and stage 1 through stage 4 chronic kidney disease, or unspecified chronic kidney disease: Secondary | ICD-10-CM | POA: Diagnosis not present

## 2020-11-19 DIAGNOSIS — N182 Chronic kidney disease, stage 2 (mild): Secondary | ICD-10-CM | POA: Diagnosis not present

## 2020-11-19 DIAGNOSIS — D631 Anemia in chronic kidney disease: Secondary | ICD-10-CM

## 2020-11-19 DIAGNOSIS — J302 Other seasonal allergic rhinitis: Secondary | ICD-10-CM

## 2020-11-19 DIAGNOSIS — E785 Hyperlipidemia, unspecified: Secondary | ICD-10-CM

## 2020-11-19 DIAGNOSIS — M419 Scoliosis, unspecified: Secondary | ICD-10-CM

## 2020-11-19 DIAGNOSIS — J449 Chronic obstructive pulmonary disease, unspecified: Secondary | ICD-10-CM

## 2020-11-19 DIAGNOSIS — M8580 Other specified disorders of bone density and structure, unspecified site: Secondary | ICD-10-CM

## 2020-11-19 DIAGNOSIS — H9190 Unspecified hearing loss, unspecified ear: Secondary | ICD-10-CM

## 2020-11-19 DIAGNOSIS — Z7982 Long term (current) use of aspirin: Secondary | ICD-10-CM

## 2020-11-29 ENCOUNTER — Ambulatory Visit (INDEPENDENT_AMBULATORY_CARE_PROVIDER_SITE_OTHER): Payer: Medicare Other | Admitting: *Deleted

## 2020-11-29 DIAGNOSIS — I5032 Chronic diastolic (congestive) heart failure: Secondary | ICD-10-CM

## 2020-11-29 DIAGNOSIS — F0390 Unspecified dementia without behavioral disturbance: Secondary | ICD-10-CM | POA: Diagnosis not present

## 2020-11-29 NOTE — Patient Instructions (Signed)
Visit Information  PATIENT GOALS: Goals Addressed            This Visit's Progress   . Matintain My Quality of Life       Timeframe:  Long-Range Goal Priority:  High Start Date:  11/07/20                           Expected End Date:                       Follow Up Date 01/02/21   . make shared treatment decisions with doctor . Talk with friends/family daily . spend time outdoors at least 3 times a week  . Take mediations as prescribed . Change positions at least every two hours to prevent further skin breakdown and aid in healing of current wounds . Work with [place], PT, and OT to improve strength . Call PCP with any new or worsening conditions . Reach out to RN Care Manager as needed [phone]   Why is this important?    Having a long-term illness can be scary.   It can also be stressful for you and your caregiver.   These steps may help.    Notes:        Patient verbalizes understanding of instructions provided today and agrees to view in [place].   Follow Up Plan:  . Telephone follow up appointment with care management team member scheduled for: 01/02/21 with RNCM . The patient has been provided with contact information for the care management team and has been advised to call with any health related questions or concerns.   [name], BSN, RN-BC Embedded Chronic Care Manager [facility] [place] Management Direct Dial: [phone]

## 2020-11-29 NOTE — Chronic Care Management (AMB) (Signed)
Chronic Care Management   CCM RN Visit Note  11/29/2020 Name: Carrie Crawford MRN: [id number] DOB: 10-Mar-1926  Subjective: [name] is a 87 y.o. year old female who is a primary care patient of Carrie Perking, FNP. The care management team was consulted for assistance with disease management and care coordination needs.    Engaged with patient's son, Carrie Crawford, by telephone for follow up visit in response to provider referral for case management and/or care coordination services.   Consent to Services:  The patient was given information about Chronic Care Management services, agreed to services, and gave verbal consent prior to initiation of services.  Please see initial visit note for detailed documentation.   Patient agreed to services and verbal consent obtained.   Assessment: Review of patient past medical history, allergies, medications, health status, including review of consultants reports, laboratory and other test data, was performed as part of comprehensive evaluation and provision of chronic care management services.   SDOH (Social Determinants of Health) assessments and interventions performed:    CCM Care Plan  No Known Allergies  Outpatient Encounter Medications as of 11/29/2020  Medication Sig  . albuterol (PROVENTIL) (2.5 MG/3ML) 0.083% nebulizer solution Take 2.5 mg by nebulization every 4 (four) hours as needed.  Marland Kitchen aspirin 81 MG tablet Take 81 mg by mouth daily.  Marland Kitchen azelastine (ASTELIN) 0.1 % nasal spray 2 sprays in each nostril at bedtime.  . colestipol (COLESTID) 1 g tablet Take 1 g by mouth 2 (two) times daily.  Marland Kitchen dextromethorphan-guaiFENesin (MUCINEX DM) 30-600 MG 12hr tablet Take 1 tablet by mouth 2 (two) times daily.  Marland Kitchen donepezil (ARICEPT) 5 MG tablet Take 5 mg by mouth at bedtime.  . fluticasone (FLONASE) 50 MCG/ACT nasal spray 2 SPRAYS IN EACH NOSTRIL QD  . furosemide (LASIX) 40 MG tablet Take 40 mg by mouth daily.  Marland Kitchen losartan (COZAAR) 100 MG tablet  Take 1 tablet (100 mg total) by mouth daily.  . potassium chloride SA (K-DUR,KLOR-CON) 20 MEQ tablet Take 40 mEq by mouth 2 (two) times daily.  . TRELEGY ELLIPTA 100-62.5-25 MCG/INH AEPB Inhale 1 puff into the lungs daily.  Marland Kitchen zinc oxide (MEIJER ZINC OXIDE) 20 % ointment Apply 1 application topically as needed for irritation.   No facility-administered encounter medications on file as of 11/29/2020.    Patient Active Problem List   Diagnosis Date Noted  . Chronic kidney disease, stage 2 (mild) 11/01/2020  . Dementia ([place]) 02/08/2019  . 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 Aortic atherosclerosis ([place]) 04/14/2018  . Right femoral fracture ([place]) 03/15/2018  . Anemia 03/15/2018  . Hyponatremia 03/15/2018  . Hyperglycemia 04/29/2017  . Obesity (BMI 30-39.9) 02/17/2017  . OAB (overactive bladder) 02/16/2017  . Age-related osteoporosis with current pathological fracture 04/23/2016  . Estrogen deficiency 04/23/2016  . Fracture of posterior thoracic vertebral body with routine healing 04/23/2016  . Diastolic heart failure ([place]) 02/17/2016  . Hyperlipidemia with target LDL less than 130   . Essential hypertension 04/26/2013  . Rhinitis, chronic 01/16/2011  . Obstructive chronic bronchitis without exacerbation ([place]) 01/16/2011    Conditions to be addressed/monitored: Chronic diastolic heart failure, Primary hypertension, Obstructive chronic bronchitis, Dementia without behavioral disturbance, Chronic kidney disease, stage 2 (mild)  Care Plan : RNCM: General Plan of Care (Adult)  Updates made by Ilean China, RN since 11/29/2020 12:00 AM    Problem: Quality of Life   Priority: High    Long-Range Goal: Quality of Life Maintained   Start Date:  11/07/2020  This Visit's Progress: On track  Recent Progress: On track  Priority: High  Note:   Current Barriers:  . Chronic Disease Management support and education needs related to dementia, heart failure, hypertension, and CKD . Cognitive  Deficits . Unable to perform ADLs independently . Unable to perform IADLs independently  Nurse Case Manager Clinical Goal(s):  . patient will work with PCP to address needs related to medical management of chronic medical conditions . patient will meet with RN Care Manager to address self-management of chronic medical conditions . Patient will experience desired quality of life since returning home  Interventions:  . 1:1 collaboration with Carrie Perking, FNP regarding development and update of comprehensive plan of care as evidenced by provider attestation and co-signature . Inter-disciplinary care team collaboration (see longitudinal plan of care) . Chart reviewed including relevant office notes, referral notes, and lab results . Reviewed medications and previously verified compliance . Discussed family/social support o Lives wither her son, Carrie Crawford. He provides primary care but they have 4 aides that rotate providing care daily. o Other children and extended family are supportive as well o Has home health nursing and PT services . Discussed mobility and ability to perform ADLs o Bed-bound and dependent on others for ADLs and IADLs o Has a hospital bed, wheelchair, and hoyer lift o They have transportation arranged to take her to appointments o Working with home health PT . Reinforced need to change positions at least every two hours to prevent skin breakdown.  o Continues to have a few small places on her buttocks but they are improving o Son and aids are putting a wedge under one side to relieve pressure. Changing positions often.  . Discussed diet and appetite o Meals are prepared for her. Appetite is good and she doesn't have any difficulty eating.  . Assessed for additional resource needs o None at this time . Assessed for caregiver strain o Things are going well . Discussed plans with patient for ongoing care management follow up and provided patient with direct contact  information for care management team . Provided with RN Care Manager contact number and encouraged to reach out as needed  Self Care Activities:  . Unable to perform ADLs or IADLs independently   Patient Goals Over the next 30 days, patient will: . make shared treatment decisions with doctor . Talk with friends/family daily . spend time outdoors at least 3 times a week  . Take mediations as prescribed . Change positions at least every two hours to prevent further skin breakdown and aid in healing of current wounds . Work with [place], PT, and OT to improve strength . Call PCP with any new or worsening conditions . Reach out to RN Care Manager as needed [phone]    Follow Up Plan:  . Telephone follow up appointment with care management team member scheduled for: 01/02/21 with RNCM . The patient has been provided with contact information for the care management team and has been advised to call with any health related questions or concerns.   [name], BSN, RN-BC Embedded Chronic Care Manager [facility] [place] [facility] Direct Dial: [phone]

## 2020-12-23 ENCOUNTER — Other Ambulatory Visit: Payer: Self-pay | Admitting: Family Medicine

## 2020-12-23 MED ORDER — POTASSIUM CHLORIDE ER 10 MEQ PO CPCR: 20.0000 meq | ORAL_CAPSULE | Freq: Two times a day (BID) | ORAL | 5 refills | Status: DC

## 2020-12-23 NOTE — Telephone Encounter (Signed)
Ok for capsules 

## 2020-12-23 NOTE — Addendum Note (Signed)
Addended by: Antonietta Barcelona D on: 12/23/2020 04:28 PM   Modules accepted: Orders

## 2020-12-23 NOTE — Telephone Encounter (Signed)
Notes from Pharmacy: PT [place]. FAMILY REQUESTING 10 MEQ CAPSULES 2 BID (TO OPEN AND SPRINKLE) OR LIQUID EQUIVALENT?

## 2021-01-02 ENCOUNTER — Telehealth: Payer: Medicare Other | Admitting: *Deleted

## 2021-01-03 ENCOUNTER — Telehealth: Payer: Self-pay | Admitting: Family Medicine

## 2021-01-03 NOTE — Telephone Encounter (Signed)
FYI: Mallory called from [facility] to get verbal order for pt to have Cameron 1x per week for 9 weeks.  Gave verbal confirmation.

## 2021-01-08 ENCOUNTER — Telehealth: Payer: Medicare Other | Admitting: *Deleted

## 2021-01-31 ENCOUNTER — Encounter: Payer: Self-pay | Admitting: Family Medicine

## 2021-01-31 ENCOUNTER — Ambulatory Visit (INDEPENDENT_AMBULATORY_CARE_PROVIDER_SITE_OTHER): Payer: Medicare Other | Admitting: Family Medicine

## 2021-01-31 ENCOUNTER — Ambulatory Visit: Payer: Medicare Other | Admitting: Family Medicine

## 2021-01-31 ENCOUNTER — Other Ambulatory Visit: Payer: Self-pay

## 2021-01-31 VITALS — BP 119/59 | HR 77 | Temp 99.2°F | Ht 64.0 in | Wt 189.6 lb

## 2021-01-31 DIAGNOSIS — N182 Chronic kidney disease, stage 2 (mild): Secondary | ICD-10-CM | POA: Diagnosis not present

## 2021-01-31 DIAGNOSIS — I700 Atherosclerosis of aorta: Secondary | ICD-10-CM

## 2021-01-31 DIAGNOSIS — D649 Anemia, unspecified: Secondary | ICD-10-CM

## 2021-01-31 DIAGNOSIS — I10 Essential (primary) hypertension: Secondary | ICD-10-CM

## 2021-01-31 DIAGNOSIS — J449 Chronic obstructive pulmonary disease, unspecified: Secondary | ICD-10-CM

## 2021-01-31 DIAGNOSIS — I5032 Chronic diastolic (congestive) heart failure: Secondary | ICD-10-CM | POA: Diagnosis not present

## 2021-01-31 DIAGNOSIS — J4489 Other specified chronic obstructive pulmonary disease: Secondary | ICD-10-CM

## 2021-01-31 DIAGNOSIS — F0390 Unspecified dementia without behavioral disturbance: Secondary | ICD-10-CM

## 2021-01-31 DIAGNOSIS — L89159 Pressure ulcer of sacral region, unspecified stage: Secondary | ICD-10-CM

## 2021-01-31 DIAGNOSIS — R32 Unspecified urinary incontinence: Secondary | ICD-10-CM

## 2021-01-31 DIAGNOSIS — H9193 Unspecified hearing loss, bilateral: Secondary | ICD-10-CM

## 2021-01-31 DIAGNOSIS — R159 Full incontinence of feces: Secondary | ICD-10-CM

## 2021-01-31 DIAGNOSIS — H1033 Unspecified acute conjunctivitis, bilateral: Secondary | ICD-10-CM

## 2021-01-31 MED ORDER — BACITRACIN-POLYMYXIN B 500-10000 UNIT/GM OP OINT: 1.0000 "application " | TOPICAL_OINTMENT | Freq: Two times a day (BID) | OPHTHALMIC | 0 refills | Status: AC

## 2021-01-31 NOTE — Progress Notes (Signed)
Established Patient Office Visit  Subjective:  Patient ID: Carrie Crawford, female    DOB: [birth year]  Age: 87 y.o. MRN: [id number]  CC:  Chief Complaint  Patient presents with   Medical Management of Chronic Issues    HPI Carrie Crawford presents for for chronic follow up. Her son is with her today. They have had a nurse coming to the home once a week for an assessment. They have also had home PT and wound care. Reports that she is regaining some strength. She does currently have a pressure injury on her sacrum. Reports that the skin is not broken and denies signs of infection. They are rotating her frequently, using cushions, incontinence supplies, and barrier creams.   Son reports decreased hearing. They have been using Debrox drops for ear wax  but hearing as not improved.   Denies chest pain or shortness of breath. Denies worsening edema.   Past Medical History:  Diagnosis Date   Arthritis    "spine" (03/16/2018)   CHF (congestive heart failure) (HCC)    Chronic bronchitis (HCC)    Chronic kidney disease    Compression fracture of lumbar vertebra (HCC)    COPD (chronic obstructive pulmonary disease) (HCC)    Dyslipidemia    Hypertension    Multiple allergies    Osteopenia    Rhinitis, chronic     Past Surgical History:  Procedure Laterality Date   CATARACT EXTRACTION W/ INTRAOCULAR LENS  IMPLANT, BILATERAL Bilateral    CHOLECYSTECTOMY     FRACTURE SURGERY     OPEN REDUCTION INTERNAL FIXATION (ORIF) DISTAL RADIAL FRACTURE Right 03/16/2018   ORIF FEMUR FRACTURE Right 03/16/2018   Procedure: OPEN REDUCTION INTERNAL FIXATION (ORIF) DISTAL FEMUR FRACTURE;  Surgeon: Shona Needles, MD;  Location: [facility];  Service: Orthopedics;  Laterality: Right;   THYROIDECTOMY, PARTIAL  2004   [facility]    Family History  Problem Relation Age of Onset   Heart disease Brother    Cancer Daughter        endometial/uterine cancer(nodule in right lung)    Social History   Socioeconomic  History   Marital status: Widowed    Spouse name: Not on file   Number of children: Not on file   Years of education: Not on file   Highest education level: Not on file  Occupational History   Not on file  Tobacco Use   Smoking status: Never   Smokeless tobacco: Never  Vaping Use   Vaping Use: Never used  Substance and Sexual Activity   Alcohol use: No    Alcohol/week: 0.0 standard drinks   Drug use: No   Sexual activity: Not Currently  Other Topics Concern   Not on file  Social History Narrative   Not on file   Social Determinants of Health   Financial Resource Strain: Not on file  Food Insecurity: No Food Insecurity   Worried About Running Out of Food in the Last Year: Never true   [place] in the Last Year: Never true  Transportation Needs: No Transportation Needs   Lack of Transportation (Medical): No   Lack of Transportation (Non-Medical): No  Physical Activity: Not on file  Stress: Not on file  Social Connections: Socially Isolated   Frequency of Communication with Friends and Family: Never   Frequency of Social Gatherings with Friends and Family: More than three times a week   Attends Religious Services: Never   Marine scientist or Organizations:  No   Attends Archivist Meetings: Never   Marital Status: Widowed  Intimate Partner Violence: Not on file    Outpatient Medications Prior to Visit  Medication Sig Dispense Refill   albuterol (PROVENTIL) (2.5 MG/3ML) 0.083% nebulizer solution Take 2.5 mg by nebulization every 4 (four) hours as needed.     aspirin 81 MG tablet Take 81 mg by mouth daily.     azelastine (ASTELIN) 0.1 % nasal spray 2 sprays in each nostril at bedtime. 90 mL 3   dextromethorphan-guaiFENesin (MUCINEX DM) 30-600 MG 12hr tablet Take 1 tablet by mouth 2 (two) times daily.     donepezil (ARICEPT) 5 MG tablet Take 5 mg by mouth at bedtime.     fluticasone (FLONASE) 50 MCG/ACT nasal spray 2 SPRAYS IN EACH NOSTRIL QD 48 g 3    furosemide (LASIX) 40 MG tablet Take 40 mg by mouth daily.     losartan (COZAAR) 100 MG tablet Take 1 tablet (100 mg total) by mouth daily. 90 tablet 3   potassium chloride (MICRO-K) 10 MEQ CR capsule Take 2 capsules (20 mEq total) by mouth 2 (two) times daily. 120 capsule 5   TRELEGY ELLIPTA 100-62.5-25 MCG/INH AEPB Inhale 1 puff into the lungs daily.     zinc oxide (MEIJER ZINC OXIDE) 20 % ointment Apply 1 application topically as needed for irritation. 56.7 g 0   colestipol (COLESTID) 1 g tablet Take 1 g by mouth 2 (two) times daily. (Patient not taking: Reported on 01/31/2021)     No facility-administered medications prior to visit.    No Known Allergies  ROS Review of Systems Negative unless specially indicated above in HPI.    Objective:    Physical Exam Vitals and nursing note reviewed.  Constitutional:      General: She is not in acute distress.    Appearance: She is not ill-appearing, toxic-appearing or diaphoretic.  HENT:     Right Ear: Tympanic membrane, ear canal and external ear normal.     Left Ear: Tympanic membrane, ear canal and external ear normal.  Eyes:     General:        Right eye: Discharge (purulent) present.        Left eye: Discharge (purulent) present.    Conjunctiva/sclera:     Right eye: Right conjunctiva is injected.     Left eye: Left conjunctiva is injected.  Cardiovascular:     Rate and Rhythm: Normal rate and regular rhythm.     Heart sounds: Normal heart sounds. No murmur heard. Pulmonary:     Effort: Pulmonary effort is normal.     Breath sounds: Normal breath sounds.  Abdominal:     General: There is distension (baseline for patient).     Palpations: Abdomen is soft.     Tenderness: There is no abdominal tenderness. There is no guarding or rebound.  Skin:    General: Skin is warm and dry.  Neurological:     Mental Status: She is alert. Mental status is at baseline.     Gait: Gait abnormal (wheelchair).  Psychiatric:        Mood  and Affect: Mood normal.        Behavior: Behavior normal.    BP (!) 119/59   Pulse 77   Temp 99.2 F (37.3 C)   Ht $R'5\' 4"'zC$  (1.626 m)   Wt 189 lb 9.6 oz (86 kg)   SpO2 97%   BMI 32.54 kg/m  Wt Readings from Last  3 Encounters:  01/31/21 189 lb 9.6 oz (86 kg)  04/14/18 189 lb 9.5 oz (86 kg)  04/09/18 200 lb 9.9 oz (91 kg)     Health Maintenance Due  Topic Date Due   Zoster Vaccines- Shingrix (1 of 2) Never done    There are no preventive care reminders to display for this patient.  Lab Results  Component Value Date   TSH 1.762 04/08/2018   Lab Results  Component Value Date   WBC 7.3 11/01/2020   HGB 10.8 (L) 11/01/2020   HCT 32.5 (L) 11/01/2020   MCV 92 11/01/2020   PLT 293 11/01/2020   Lab Results  Component Value Date   NA 134 11/01/2020   K 4.2 11/01/2020   CO2 26 11/01/2020   GLUCOSE 123 (H) 11/01/2020   BUN 12 11/01/2020   CREATININE 0.77 11/01/2020   BILITOT <0.2 11/01/2020   ALKPHOS 73 11/01/2020   AST 12 11/01/2020   ALT 5 11/01/2020   PROT 6.8 11/01/2020   ALBUMIN 3.1 (L) 11/01/2020   CALCIUM 8.3 (L) 11/01/2020   ANIONGAP 5 04/18/2018   EGFR 72 11/01/2020   GFR 76.15 04/29/2017   Lab Results  Component Value Date   CHOL 184 10/27/2016   Lab Results  Component Value Date   HDL 56.20 10/27/2016   Lab Results  Component Value Date   LDLCALC 105 (H) 10/27/2016   Lab Results  Component Value Date   TRIG 112.0 10/27/2016   Lab Results  Component Value Date   CHOLHDL 3 10/27/2016   Lab Results  Component Value Date   HGBA1C 5.8 04/29/2017      Assessment & Plan:   Carrie Crawford was seen today for medical management of chronic issues.  Diagnoses and all orders for this visit:  Primary hypertension Well controlled on current regimen. Labs pending.  -     CBC with Differential/Platelet -     CMP14+EGFR  Aortic atherosclerosis ([place]) Labs pending. On aspirin -     CBC with Differential/Platelet -     UXL24+MWNU  Chronic diastolic  heart failure ([place]) Labs pending. Well controlled on current regimen.  -     CBC with Differential/Platelet -     CMP14+EGFR  Chronic kidney disease, stage 2 (mild) Labs pending.  -     CBC with Differential/Platelet -     CMP14+EGFR  Anemia, unspecified type Labs pending.  -     CBC with Differential/Platelet -     CMP14+EGFR  Dementia without behavioral disturbance, unspecified dementia type ([place]) Stable  Obstructive chronic bronchitis without exacerbation ([place]) Well controlled on current regimen.   Pressure injury of skin of sacral region, unspecified injury stage Unable to visualize due to mobility issues. Denies broken skin or signs of infection. Continue incontinence care, barrier creams, frequent repositioning, cushions.   Acute conjunctivitis of both eyes, unspecified acute conjunctivitis type -     bacitracin-polymyxin b (POLYSPORIN) ophthalmic ointment; Place 1 application into both eyes every 12 (twelve) hours for 7 days. apply to eye every 12 hours while awake  Hearing difficulty of both ears No cerumen impaction on exam. Referral to audiology per request.  -     Ambulatory referral to Audiology  Bowel and bladder incontinence Supplies ordered.    Follow-up: Return in about 6 months (around 08/03/2021) for chronic follow up. Sooner if needed.   The patient indicates understanding of these issues and agrees with the plan.   [name], FNP

## 2021-01-31 NOTE — Patient Instructions (Signed)
Pressure Injury  A pressure injury is damage to the skin and underlying tissue that results from pressure being applied to an area of the body. It often affects people who mustspend a long time in a bed or chair because of a medical condition. Pressure injuries usually occur: Over bony parts of the body, such as the tailbone, shoulders, elbows, hips, heels, spine, ankles, and back of the head. Under medical devices that make contact with the body, such as respiratory equipment, stockings, tubes, and splints. Pressure injuries start as reddened areas on the skin and can lead to pain andan open wound. What are the causes? This condition is caused by frequent or constant pressure to an area of the body. Decreased blood flow to the skin can eventually cause the skin tissue todie and break down, causing a wound. What increases the risk? You are more likely to develop this condition if you: Are in the hospital or an extended care facility. Are bedridden or in a wheelchair. Have an injury or disease that keeps you from: Moving normally. Feeling pain or pressure. Have a condition that: Makes you sleepy or less alert. Causes poor blood flow. Need to wear a medical device. Have poor control of your bladder or bowel functions (incontinence). Have poor nutrition (malnutrition). If you are at risk for pressure injuries, your health care provider may recommend certain types of mattresses, mattress covers, pillows, cushions, or boots to help prevent them. These may include products filled with air, foam,gel, or sand. What are the signs or symptoms? Symptoms of this condition depend on the severity of the injury. Symptoms may include: Red or dark areas of the skin. Pain, warmth, or a change of skin texture. Blisters. An open wound. How is this diagnosed? This condition is diagnosed with a medical history and physical exam. You may also have tests, such as: Blood tests. Imaging tests. Blood flow  tests. Your pressure injury will be staged based on its severity. Staging is based on: The depth of the tissue injury, including whether there is exposure of muscle, bone, or tendon. The cause of the pressure injury. How is this treated? This condition may be treated by: Relieving or redistributing pressure on your skin. This includes: Frequently changing your position. Avoiding positions that caused the wound or that can make the wound worse. Using specific bed mattresses, chair cushions, or protective boots. Moving medical devices from an area of pressure, or placing padding between the skin and the device. Using foams, creams, or powders to prevent rubbing (friction) on the skin. Keeping your skin clean and dry. This may include using a skin cleanser or skin barrier as told by your health care provider. Cleaning your injury and removing any dead tissue from the wound (debridement). Placing a bandage (dressing) over your injury. Using medicines for pain or to prevent or treat infection. Surgery may be needed if other treatments are not working or if your injury isvery deep. Follow these instructions at home: Wound care Follow instructions from your health care provider about how to take care of your wound. Make sure you: Wash your hands with soap and water before and after you change your bandage (dressing). If soap and water are not available, use hand sanitizer. Change your dressing as told by your health care provider.  Check your wound every day for signs of infection. Have a caregiver do this for you if you are not able. Check for: Redness, swelling, or increased pain. More fluid or blood. Warmth. Pus  or a bad smell. Skin care Keep your skin clean and dry. Gently pat your skin dry. Do not rub or massage your skin. You or a caregiver should check your skin every day for any changes in color or any new blisters or sores (ulcers). Medicines Take over-the-counter and prescription  medicines only as told by your health care provider. If you were prescribed an antibiotic medicine, take or apply it as told by your health care provider. Do not stop using the antibiotic even if your condition improves. Reducing and redistributing pressure Do not lie or sit in one position for a long time. Move or change position every 1-2 hours, or as told by your health care provider. Use pillows or cushions to reduce pressure. Ask your health care provider to recommend cushions or pads for you. General instructions  Eat a healthy diet that includes lots of protein. Drink enough fluid to keep your urine pale yellow. Be as active as you can every day. Ask your health care provider to suggest safe exercises or activities. Do not abuse drugs or alcohol. Do not use any products that contain nicotine or tobacco, such as cigarettes, e-cigarettes, and chewing tobacco. If you need help quitting, ask your health care provider. Keep all follow-up visits as told by your health care provider. This is important.  Contact a health care provider if: You have: A fever or chills. Pain that is not helped by medicine. Any changes in skin color. New blisters or sores. Pus or a bad smell coming from your wound. Redness, swelling, or pain around your wound. More fluid or blood coming from your wound. Your wound does not improve after 1-2 weeks of treatment. Summary A pressure injury is damage to the skin and underlying tissue that results from pressure being applied to an area of the body. Do not lie or sit in one position for a long time. Your health care provider may advise you to move or change position every 1-2 hours. Follow instructions from your health care provider about how to take care of your wound. Keep all follow-up visits as told by your health care provider. This is important. This information is not intended to replace advice given to you by your health care provider. Make sure you discuss  any questions you have with your healthcare provider. Document Revised: 01/26/2018 Document Reviewed: 01/26/2018 Elsevier Patient Education  [place].

## 2021-02-01 LAB — CBC WITH DIFFERENTIAL/PLATELET
Basophils Absolute: 0.1 10*3/uL (ref 0.0–0.2)
Basos: 1 %
EOS (ABSOLUTE): 0.3 10*3/uL (ref 0.0–0.4)
Eos: 3 %
Hematocrit: 29.3 % — ABNORMAL LOW (ref 34.0–46.6)
Hemoglobin: 9.8 g/dL — ABNORMAL LOW (ref 11.1–15.9)
Immature Grans (Abs): 0 10*3/uL (ref 0.0–0.1)
Immature Granulocytes: 1 %
Lymphocytes Absolute: 2.4 10*3/uL (ref 0.7–3.1)
Lymphs: 32 %
MCH: 30.6 pg (ref 26.6–33.0)
MCHC: 33.4 g/dL (ref 31.5–35.7)
MCV: 92 fL (ref 79–97)
Monocytes Absolute: 0.8 10*3/uL (ref 0.1–0.9)
Monocytes: 11 %
Neutrophils Absolute: 4.1 10*3/uL (ref 1.4–7.0)
Neutrophils: 52 %
Platelets: 259 10*3/uL (ref 150–450)
RBC: 3.2 x10E6/uL — ABNORMAL LOW (ref 3.77–5.28)
RDW: 12.4 % (ref 11.7–15.4)
WBC: 7.7 10*3/uL (ref 3.4–10.8)

## 2021-02-01 LAB — CMP14+EGFR
ALT: 7 IU/L (ref 0–32)
AST: 12 IU/L (ref 0–40)
Albumin/Globulin Ratio: 0.8 — ABNORMAL LOW (ref 1.2–2.2)
Albumin: 3.2 g/dL — ABNORMAL LOW (ref 3.5–4.6)
Alkaline Phosphatase: 84 IU/L (ref 44–121)
BUN/Creatinine Ratio: 22 (ref 12–28)
BUN: 17 mg/dL (ref 10–36)
Bilirubin Total: 0.2 mg/dL (ref 0.0–1.2)
CO2: 29 mmol/L (ref 20–29)
Calcium: 8.3 mg/dL — ABNORMAL LOW (ref 8.7–10.3)
Chloride: 85 mmol/L — ABNORMAL LOW (ref 96–106)
Creatinine, Ser: 0.78 mg/dL (ref 0.57–1.00)
Globulin, Total: 3.9 g/dL (ref 1.5–4.5)
Glucose: 121 mg/dL — ABNORMAL HIGH (ref 65–99)
Potassium: 4.8 mmol/L (ref 3.5–5.2)
Sodium: 129 mmol/L — ABNORMAL LOW (ref 134–144)
Total Protein: 7.1 g/dL (ref 6.0–8.5)
eGFR: 71 mL/min/{1.73_m2} (ref >59)

## 2021-02-03 ENCOUNTER — Other Ambulatory Visit: Payer: Self-pay | Admitting: Family Medicine

## 2021-02-03 ENCOUNTER — Other Ambulatory Visit: Payer: Self-pay

## 2021-02-03 DIAGNOSIS — R710 Precipitous drop in hematocrit: Secondary | ICD-10-CM

## 2021-02-03 DIAGNOSIS — D649 Anemia, unspecified: Secondary | ICD-10-CM

## 2021-03-19 ENCOUNTER — Ambulatory Visit (INDEPENDENT_AMBULATORY_CARE_PROVIDER_SITE_OTHER): Payer: Medicare Other | Admitting: Nurse Practitioner

## 2021-03-19 DIAGNOSIS — R059 Cough, unspecified: Secondary | ICD-10-CM

## 2021-03-19 MED ORDER — AZITHROMYCIN 250 MG PO TABS: ORAL_TABLET | ORAL | 0 refills | Status: DC

## 2021-03-19 MED ORDER — PREDNISONE 20 MG PO TABS: 40.0000 mg | ORAL_TABLET | Freq: Every day | ORAL | 0 refills | Status: AC

## 2021-03-19 MED ORDER — BENZONATATE 100 MG PO CAPS: 100.0000 mg | ORAL_CAPSULE | Freq: Three times a day (TID) | ORAL | 0 refills | Status: DC | PRN

## 2021-03-19 NOTE — Progress Notes (Signed)
Virtual Visit  Note Due to COVID-19 pandemic this visit was conducted virtually. This visit type was conducted due to national recommendations for restrictions regarding the COVID-19 Pandemic (e.g. social distancing, sheltering in place) in an effort to limit this patient's exposure and mitigate transmission in our community. All issues noted in this document were discussed and addressed.  A physical exam was not performed with this format.  I connected with Carrie Crawford on 03/19/21 at 1:43 by telephone and verified that I am speaking with the correct person using two identifiers. Carrie Crawford is currently located at home and her son is currently with her during visit. The provider, Mary-Margaret Hassell Done, FNP is located in their office at time of visit.  I discussed the limitations, risks, security and privacy concerns of performing an evaluation and management service by telephone and the availability of in person appointments. I also discussed with the patient that there may be a patient responsible charge related to this service. The patient expressed understanding and agreed to proceed.   History and Present Illness:    Chief Complaint: URI   HPI Patient has her son speak for her due  to she cannot hear oin the phone. She has had cough and congestion since Saturday. Her cough has goten worse. A nure from [facility] came to see her today said that she has rhonchi. Wet cough. O2 sat 95%. Temp 99.3.    Review of Systems  Constitutional:  Negative for chills and fever.  HENT:  Positive for congestion. Negative for sore throat.   Respiratory:  Positive for cough and sputum production. Negative for shortness of breath.   Neurological:  Negative for headaches.    Observations/Objective: Not able to speak with patient because she cannot hear to talk on phone.   Assessment and Plan: Carrie Crawford in today with chief complaint of URI   1. Cough 1. Take meds as prescribed 2. Use a  cool mist humidifier especially during the winter months and when heat has been humid. 3. Use saline nose sprays frequently 4. Saline irrigations of the nose can be very helpful if done frequently.  * 4X daily for 1 week*  * Use of a nettie pot can be helpful with this. Follow directions with this* 5. Drink 3 glasses of water a day- no more due to CHF 6. Keep thermostat turn down low 7.For any cough or congestion  Use plain Mucinex- regular strength or max strength is fine   * Children- consult with Pharmacist for dosing 8. For fever or aces or pains- take tylenol or ibuprofen appropriate for age and weight.  * for fevers greater than 101 orally you may alternate ibuprofen and tylenol every  3 hours.   Meds ordered this encounter  Medications   azithromycin (ZITHROMAX Z-PAK) 250 MG tablet    Sig: As directed    Dispense:  6 tablet    Refill:  0    Order Specific Question:   Supervising Provider    Answer:   Caryl Pina A [[id number]]   benzonatate (TESSALON PERLES) 100 MG capsule    Sig: Take 1 capsule (100 mg total) by mouth 3 (three) times daily as needed for cough.    Dispense:  20 capsule    Refill:  0    Order Specific Question:   Supervising Provider    Answer:   Caryl Pina A [[id number]]   predniSONE (DELTASONE) 20 MG tablet    Sig: Take 2 tablets (40  mg total) by mouth daily with breakfast for 5 days. 2 po daily for 5 days    Dispense:  10 tablet    Refill:  0    Order Specific Question:   Supervising Provider    Answer:   Caryl Pina A [[id number]]      Follow Up Instructions: prn    I discussed the assessment and treatment plan with the patient. The patient was provided an opportunity to ask questions and all were answered. The patient agreed with the plan and demonstrated an understanding of the instructions.   The patient was advised to call back or seek an in-person evaluation if the symptoms worsen or if the condition fails to improve as  anticipated.  The above assessment and management plan was discussed with the patient. The patient verbalized understanding of and has agreed to the management plan. Patient is aware to call the clinic if symptoms persist or worsen. Patient is aware when to return to the clinic for a follow-up visit. Patient educated on when it is appropriate to go to the emergency department.   Time call ended:  1:55  I provided 12 minutes of  non face-to-face time during this encounter.    Mary-Margaret Hassell Done, FNP

## 2021-04-01 ENCOUNTER — Ambulatory Visit (INDEPENDENT_AMBULATORY_CARE_PROVIDER_SITE_OTHER): Payer: Medicare Other | Admitting: Family Medicine

## 2021-04-01 VITALS — HR 84 | Temp 99.4°F

## 2021-04-01 DIAGNOSIS — E860 Dehydration: Secondary | ICD-10-CM | POA: Diagnosis not present

## 2021-04-01 DIAGNOSIS — R34 Anuria and oliguria: Secondary | ICD-10-CM

## 2021-04-01 NOTE — Progress Notes (Signed)
Telephone visit  Subjective: CC:URI PCP: Carrie Perking, FNP SFK:Carrie Crawford is a 87 y.o. female calls for telephone consult today. Patient provides verbal consent for consult held via phone.  Due to COVID-19 pandemic this visit was conducted virtually. This visit type was conducted due to national recommendations for restrictions regarding the COVID-19 Pandemic (e.g. social distancing, sheltering in place) in an effort to limit this patient's exposure and mitigate transmission in our community. All issues noted in this document were discussed and addressed.  A physical exam was not performed with this format.   Location of patient: home Location of provider: [facility] Others present for call: son [name]  1. Productive cough Reports productive cough and rhinorrhea. He reports that she is resting ok.  Worried about getting dehydrated due to some decreased urine output.   Tmax 99.72F.  Today rhinorrhea and cough has gotten better but she still has some rhinorrhea at nighttime.  He has Astelin and Flonase on hand and has been utilizing these for her.  Has not been tested for COVID-19.  She unfortunately is incontinent of urine and feces and has had chronic diarrhea for some time now.  She has caregivers that change her regularly.   ROS: Per HPI  No Known Allergies Past Medical History:  Diagnosis Date   Arthritis    "spine" (03/16/2018)   CHF (congestive heart failure) (HCC)    Chronic bronchitis (HCC)    Chronic kidney disease    Compression fracture of lumbar vertebra (HCC)    COPD (chronic obstructive pulmonary disease) (HCC)    Dyslipidemia    Hypertension    Multiple allergies    Osteopenia    Rhinitis, chronic     Current Outpatient Medications:    albuterol (PROVENTIL) (2.5 MG/3ML) 0.083% nebulizer solution, Take 2.5 mg by nebulization every 4 (four) hours as needed., Disp: , Rfl:    aspirin 81 MG tablet, Take 81 mg by mouth daily., Disp: , Rfl:    azelastine (ASTELIN) 0.1 %  nasal spray, 2 sprays in each nostril at bedtime., Disp: 90 mL, Rfl: 3   colestipol (COLESTID) 1 g tablet, Take 1 g by mouth 2 (two) times daily. (Patient not taking: Reported on 01/31/2021), Disp: , Rfl:    dextromethorphan-guaiFENesin (MUCINEX DM) 30-600 MG 12hr tablet, Take 1 tablet by mouth 2 (two) times daily., Disp: , Rfl:    donepezil (ARICEPT) 5 MG tablet, Take 5 mg by mouth at bedtime., Disp: , Rfl:    fluticasone (FLONASE) 50 MCG/ACT nasal spray, 2 SPRAYS IN EACH NOSTRIL QD, Disp: 48 g, Rfl: 3   furosemide (LASIX) 40 MG tablet, Take 40 mg by mouth daily., Disp: , Rfl:    losartan (COZAAR) 100 MG tablet, Take 1 tablet (100 mg total) by mouth daily., Disp: 90 tablet, Rfl: 3   potassium chloride (MICRO-K) 10 MEQ CR capsule, Take 2 capsules (20 mEq total) by mouth 2 (two) times daily., Disp: 120 capsule, Rfl: 5   TRELEGY ELLIPTA 100-62.5-25 MCG/INH AEPB, Inhale 1 puff into the lungs daily., Disp: , Rfl:    zinc oxide (MEIJER ZINC OXIDE) 20 % ointment, Apply 1 application topically as needed for irritation., Disp: 56.7 g, Rfl: 0  Assessment/ Plan: 87 y.o. female   Dehydration  Decreased urine output  I suspect dehydration in the setting of decreased p.o. intake and decreased urine output.  I have asked that he p.o. challenge her with fluids in efforts to improve the symptoms.  It certainly possible that she  has developed a urinary tract infection in the setting of recent illness and chronic diarrhea.  If her urine output or mentation does not really improve with increased fluids, I suspect empiric antibiotics would be appropriate.  Ideally, I would like to get a urine analysis but unfortunately given incontinence it would be very difficult to do so without in and out catheterization.  We discussed that if she is not able to tolerate fluids by mouth that he is to seek immediate medical attention in the emergency department for IV fluids.  We also discussed that if she is to worsen overnight he  is to seek ER eval.  Otherwise, I will contact him first thing in the morning to determine how she has done overnight.  He voiced good understanding of the plan  Start time: 12:24pm End time: 12:37pm  Total time spent on patient care (including telephone call/ virtual visit): 13 minutes  [place], [place] [phone]

## 2021-05-08 ENCOUNTER — Other Ambulatory Visit: Payer: Self-pay

## 2021-05-08 ENCOUNTER — Other Ambulatory Visit: Payer: Medicare Other

## 2021-05-08 DIAGNOSIS — R710 Precipitous drop in hematocrit: Secondary | ICD-10-CM

## 2021-05-09 LAB — CBC WITH DIFFERENTIAL/PLATELET
Basophils Absolute: 0.1 10*3/uL (ref 0.0–0.2)
Basos: 1 %
EOS (ABSOLUTE): 0.3 10*3/uL (ref 0.0–0.4)
Eos: 4 %
Hematocrit: 32.2 % — ABNORMAL LOW (ref 34.0–46.6)
Hemoglobin: 10.7 g/dL — ABNORMAL LOW (ref 11.1–15.9)
Immature Grans (Abs): 0.1 10*3/uL (ref 0.0–0.1)
Immature Granulocytes: 1 %
Lymphocytes Absolute: 2.7 10*3/uL (ref 0.7–3.1)
Lymphs: 32 %
MCH: 31 pg (ref 26.6–33.0)
MCHC: 33.2 g/dL (ref 31.5–35.7)
MCV: 93 fL (ref 79–97)
Monocytes Absolute: 0.9 10*3/uL (ref 0.1–0.9)
Monocytes: 11 %
Neutrophils Absolute: 4.3 10*3/uL (ref 1.4–7.0)
Neutrophils: 51 %
Platelets: 332 10*3/uL (ref 150–450)
RBC: 3.45 x10E6/uL — ABNORMAL LOW (ref 3.77–5.28)
RDW: 12.3 % (ref 11.7–15.4)
WBC: 8.3 10*3/uL (ref 3.4–10.8)

## 2021-05-14 ENCOUNTER — Other Ambulatory Visit: Payer: Self-pay | Admitting: Family Medicine

## 2021-05-14 DIAGNOSIS — J310 Chronic rhinitis: Secondary | ICD-10-CM

## 2021-05-14 MED ORDER — FLUTICASONE PROPIONATE 50 MCG/ACT NA SUSP: NASAL | 3 refills | Status: DC

## 2021-05-19 ENCOUNTER — Other Ambulatory Visit: Payer: Self-pay | Admitting: Family Medicine

## 2021-05-19 DIAGNOSIS — I10 Essential (primary) hypertension: Secondary | ICD-10-CM

## 2021-05-27 ENCOUNTER — Other Ambulatory Visit: Payer: Self-pay | Admitting: Family Medicine

## 2021-05-27 NOTE — Telephone Encounter (Signed)
Historical med Last office visit with you in July 2022 with instruction to follow up in 6 months

## 2021-06-09 ENCOUNTER — Other Ambulatory Visit: Payer: Self-pay | Admitting: Family Medicine

## 2021-06-20 ENCOUNTER — Other Ambulatory Visit: Payer: Self-pay | Admitting: Family Medicine

## 2021-07-13 ENCOUNTER — Inpatient Hospital Stay (HOSPITAL_COMMUNITY)
Admission: EM | Admit: 2021-07-13 | Discharge: 2021-07-18 | DRG: 871 | Disposition: A | Discharge status: Home / Self Care | Payer: Medicare Other | Attending: Internal Medicine | Admitting: Internal Medicine

## 2021-07-13 ENCOUNTER — Other Ambulatory Visit: Payer: Self-pay

## 2021-07-13 ENCOUNTER — Encounter (HOSPITAL_COMMUNITY): Payer: Self-pay

## 2021-07-13 ENCOUNTER — Emergency Department (HOSPITAL_COMMUNITY): Payer: Medicare Other

## 2021-07-13 DIAGNOSIS — J1282 Pneumonia due to coronavirus disease 2019: Secondary | ICD-10-CM | POA: Diagnosis present

## 2021-07-13 DIAGNOSIS — E871 Hypo-osmolality and hyponatremia: Secondary | ICD-10-CM | POA: Diagnosis present

## 2021-07-13 DIAGNOSIS — D649 Anemia, unspecified: Secondary | ICD-10-CM | POA: Diagnosis present

## 2021-07-13 DIAGNOSIS — I5030 Unspecified diastolic (congestive) heart failure: Secondary | ICD-10-CM | POA: Diagnosis present

## 2021-07-13 DIAGNOSIS — I5032 Chronic diastolic (congestive) heart failure: Secondary | ICD-10-CM

## 2021-07-13 DIAGNOSIS — F0390 Unspecified dementia without behavioral disturbance: Secondary | ICD-10-CM | POA: Diagnosis present

## 2021-07-13 DIAGNOSIS — E669 Obesity, unspecified: Secondary | ICD-10-CM | POA: Diagnosis present

## 2021-07-13 DIAGNOSIS — B9620 Unspecified Escherichia coli [E. coli] as the cause of diseases classified elsewhere: Secondary | ICD-10-CM | POA: Diagnosis present

## 2021-07-13 DIAGNOSIS — Z66 Do not resuscitate: Secondary | ICD-10-CM | POA: Diagnosis present

## 2021-07-13 DIAGNOSIS — J9611 Chronic respiratory failure with hypoxia: Secondary | ICD-10-CM

## 2021-07-13 DIAGNOSIS — D72829 Elevated white blood cell count, unspecified: Secondary | ICD-10-CM | POA: Diagnosis not present

## 2021-07-13 DIAGNOSIS — I130 Hypertensive heart and chronic kidney disease with heart failure and stage 1 through stage 4 chronic kidney disease, or unspecified chronic kidney disease: Secondary | ICD-10-CM | POA: Diagnosis present

## 2021-07-13 DIAGNOSIS — J9601 Acute respiratory failure with hypoxia: Secondary | ICD-10-CM | POA: Diagnosis not present

## 2021-07-13 DIAGNOSIS — E876 Hypokalemia: Secondary | ICD-10-CM | POA: Diagnosis present

## 2021-07-13 DIAGNOSIS — Z7982 Long term (current) use of aspirin: Secondary | ICD-10-CM

## 2021-07-13 DIAGNOSIS — Z8249 Family history of ischemic heart disease and other diseases of the circulatory system: Secondary | ICD-10-CM | POA: Diagnosis not present

## 2021-07-13 DIAGNOSIS — H9190 Unspecified hearing loss, unspecified ear: Secondary | ICD-10-CM | POA: Diagnosis present

## 2021-07-13 DIAGNOSIS — R0602 Shortness of breath: Secondary | ICD-10-CM | POA: Diagnosis present

## 2021-07-13 DIAGNOSIS — Z7401 Bed confinement status: Secondary | ICD-10-CM | POA: Diagnosis not present

## 2021-07-13 DIAGNOSIS — J189 Pneumonia, unspecified organism: Secondary | ICD-10-CM | POA: Diagnosis not present

## 2021-07-13 DIAGNOSIS — R269 Unspecified abnormalities of gait and mobility: Secondary | ICD-10-CM | POA: Diagnosis present

## 2021-07-13 DIAGNOSIS — A4189 Other specified sepsis: Principal | ICD-10-CM | POA: Diagnosis present

## 2021-07-13 DIAGNOSIS — Z961 Presence of intraocular lens: Secondary | ICD-10-CM | POA: Diagnosis present

## 2021-07-13 DIAGNOSIS — E8809 Other disorders of plasma-protein metabolism, not elsewhere classified: Secondary | ICD-10-CM | POA: Diagnosis present

## 2021-07-13 DIAGNOSIS — N390 Urinary tract infection, site not specified: Secondary | ICD-10-CM | POA: Diagnosis present

## 2021-07-13 DIAGNOSIS — R000 Tachycardia, unspecified: Secondary | ICD-10-CM | POA: Diagnosis present

## 2021-07-13 DIAGNOSIS — Z9842 Cataract extraction status, left eye: Secondary | ICD-10-CM

## 2021-07-13 DIAGNOSIS — J9621 Acute and chronic respiratory failure with hypoxia: Secondary | ICD-10-CM | POA: Diagnosis present

## 2021-07-13 DIAGNOSIS — E785 Hyperlipidemia, unspecified: Secondary | ICD-10-CM | POA: Diagnosis present

## 2021-07-13 DIAGNOSIS — J440 Chronic obstructive pulmonary disease with acute lower respiratory infection: Secondary | ICD-10-CM | POA: Diagnosis present

## 2021-07-13 DIAGNOSIS — B964 Proteus (mirabilis) (morganii) as the cause of diseases classified elsewhere: Secondary | ICD-10-CM | POA: Diagnosis present

## 2021-07-13 DIAGNOSIS — R739 Hyperglycemia, unspecified: Secondary | ICD-10-CM | POA: Diagnosis not present

## 2021-07-13 DIAGNOSIS — U071 COVID-19: Secondary | ICD-10-CM | POA: Diagnosis present

## 2021-07-13 DIAGNOSIS — Z9841 Cataract extraction status, right eye: Secondary | ICD-10-CM

## 2021-07-13 DIAGNOSIS — J310 Chronic rhinitis: Secondary | ICD-10-CM | POA: Diagnosis present

## 2021-07-13 DIAGNOSIS — I10 Essential (primary) hypertension: Secondary | ICD-10-CM | POA: Diagnosis not present

## 2021-07-13 DIAGNOSIS — Z6832 Body mass index (BMI) 32.0-32.9, adult: Secondary | ICD-10-CM | POA: Diagnosis not present

## 2021-07-13 DIAGNOSIS — A419 Sepsis, unspecified organism: Secondary | ICD-10-CM

## 2021-07-13 DIAGNOSIS — E46 Unspecified protein-calorie malnutrition: Secondary | ICD-10-CM | POA: Diagnosis present

## 2021-07-13 DIAGNOSIS — B9689 Other specified bacterial agents as the cause of diseases classified elsewhere: Secondary | ICD-10-CM | POA: Diagnosis present

## 2021-07-13 DIAGNOSIS — Z79899 Other long term (current) drug therapy: Secondary | ICD-10-CM

## 2021-07-13 LAB — COMPREHENSIVE METABOLIC PANEL
ALT: 11 U/L (ref 0–44)
AST: 17 U/L (ref 15–41)
Albumin: 2.7 g/dL — ABNORMAL LOW (ref 3.5–5.0)
Alkaline Phosphatase: 71 U/L (ref 38–126)
Anion gap: 10 (ref 5–15)
BUN: 15 mg/dL (ref 8–23)
CO2: 27 mmol/L (ref 22–32)
Calcium: 7.9 mg/dL — ABNORMAL LOW (ref 8.9–10.3)
Chloride: 85 mmol/L — ABNORMAL LOW (ref 98–111)
Creatinine, Ser: 0.78 mg/dL (ref 0.44–1.00)
GFR, Estimated: >60 mL/min (ref >60)
Glucose, Bld: 149 mg/dL — ABNORMAL HIGH (ref 70–99)
Potassium: 3.7 mmol/L (ref 3.5–5.1)
Sodium: 122 mmol/L — ABNORMAL LOW (ref 135–145)
Total Bilirubin: 0.3 mg/dL (ref 0.3–1.2)
Total Protein: 7.4 g/dL (ref 6.5–8.1)

## 2021-07-13 LAB — APTT: aPTT: 28 seconds (ref 24–36)

## 2021-07-13 LAB — RESP PANEL BY RT-PCR (FLU A&B, COVID) ARPGX2
Influenza A by PCR: NEGATIVE
Influenza B by PCR: NEGATIVE
SARS Coronavirus 2 by RT PCR: POSITIVE — AB

## 2021-07-13 LAB — CBC WITH DIFFERENTIAL/PLATELET
Abs Immature Granulocytes: 0.17 10*3/uL — ABNORMAL HIGH (ref 0.00–0.07)
Basophils Absolute: 0 10*3/uL (ref 0.0–0.1)
Basophils Relative: 0 %
Eosinophils Absolute: 0.1 10*3/uL (ref 0.0–0.5)
Eosinophils Relative: 0 %
HCT: 30.7 % — ABNORMAL LOW (ref 36.0–46.0)
Hemoglobin: 10.6 g/dL — ABNORMAL LOW (ref 12.0–15.0)
Immature Granulocytes: 1 %
Lymphocytes Relative: 7 %
Lymphs Abs: 1.3 10*3/uL (ref 0.7–4.0)
MCH: 32.8 pg (ref 26.0–34.0)
MCHC: 34.5 g/dL (ref 30.0–36.0)
MCV: 95 fL (ref 80.0–100.0)
Monocytes Absolute: 1.3 10*3/uL — ABNORMAL HIGH (ref 0.1–1.0)
Monocytes Relative: 7 %
Neutro Abs: 16.6 10*3/uL — ABNORMAL HIGH (ref 1.7–7.7)
Neutrophils Relative %: 85 %
Platelets: 273 10*3/uL (ref 150–400)
RBC: 3.23 MIL/uL — ABNORMAL LOW (ref 3.87–5.11)
RDW: 12.7 % (ref 11.5–15.5)
WBC: 19.5 10*3/uL — ABNORMAL HIGH (ref 4.0–10.5)
nRBC: 0 % (ref 0.0–0.2)

## 2021-07-13 LAB — URINALYSIS, ROUTINE W REFLEX MICROSCOPIC
Bilirubin Urine: NEGATIVE
Glucose, UA: NEGATIVE mg/dL
Ketones, ur: NEGATIVE mg/dL
Nitrite: NEGATIVE
Protein, ur: 30 mg/dL — AB
Specific Gravity, Urine: 1.011 (ref 1.005–1.030)
WBC, UA: >50 WBC/hpf — ABNORMAL HIGH (ref 0–5)
pH: 8 (ref 5.0–8.0)

## 2021-07-13 LAB — SODIUM, URINE, RANDOM: Sodium, Ur: 58 mmol/L

## 2021-07-13 LAB — LACTIC ACID, PLASMA: Lactic Acid, Venous: 1.5 mmol/L (ref 0.5–1.9)

## 2021-07-13 LAB — PROTIME-INR
INR: 1.3 — ABNORMAL HIGH (ref 0.8–1.2)
Prothrombin Time: 15.8 seconds — ABNORMAL HIGH (ref 11.4–15.2)

## 2021-07-13 MED ORDER — LOSARTAN POTASSIUM 50 MG PO TABS
100.0000 mg | ORAL_TABLET | Freq: Every day | ORAL | Status: DC
Start: 2021-07-13 — End: 2021-07-19
  Administered 2021-07-13 – 2021-07-17 (×5): 100 mg via ORAL
  Filled 2021-07-13 (×6): qty 2

## 2021-07-13 MED ORDER — SODIUM CHLORIDE 0.9 % IV SOLN
500.0000 mg | INTRAVENOUS | Status: AC
  Administered 2021-07-13 – 2021-07-16 (×4): 500 mg via INTRAVENOUS
  Filled 2021-07-13 (×5): qty 5

## 2021-07-13 MED ORDER — IPRATROPIUM BROMIDE HFA 17 MCG/ACT IN AERS
2.0000 | INHALATION_SPRAY | Freq: Four times a day (QID) | RESPIRATORY_TRACT | Status: DC
  Administered 2021-07-14 (×3): 2 via RESPIRATORY_TRACT
  Filled 2021-07-13 (×2): qty 12.9

## 2021-07-13 MED ORDER — DONEPEZIL HCL 5 MG PO TABS
5.0000 mg | ORAL_TABLET | Freq: Every day | ORAL | Status: DC
  Administered 2021-07-13 – 2021-07-17 (×5): 5 mg via ORAL
  Filled 2021-07-13 (×6): qty 1

## 2021-07-13 MED ORDER — HYDROCOD POLST-CPM POLST ER 10-8 MG/5ML PO SUER: 5.0000 mL | Freq: Two times a day (BID) | ORAL | Status: DC | PRN

## 2021-07-13 MED ORDER — GUAIFENESIN-DM 100-10 MG/5ML PO SYRP
10.0000 mL | ORAL_SOLUTION | ORAL | Status: DC | PRN
  Administered 2021-07-13 – 2021-07-18 (×2): 10 mL via ORAL
  Filled 2021-07-13 (×2): qty 10

## 2021-07-13 MED ORDER — ENSURE ENLIVE PO LIQD
237.0000 mL | Freq: Two times a day (BID) | ORAL | Status: DC
  Administered 2021-07-14 – 2021-07-16 (×6): 237 mL via ORAL
  Filled 2021-07-13 (×2): qty 237

## 2021-07-13 MED ORDER — ZINC SULFATE 220 (50 ZN) MG PO CAPS
220.0000 mg | ORAL_CAPSULE | Freq: Every day | ORAL | Status: DC
  Administered 2021-07-14 – 2021-07-18 (×5): 220 mg via ORAL
  Filled 2021-07-13 (×5): qty 1

## 2021-07-13 MED ORDER — ASCORBIC ACID 500 MG PO TABS
500.0000 mg | ORAL_TABLET | Freq: Every day | ORAL | Status: DC
  Administered 2021-07-14 – 2021-07-18 (×5): 500 mg via ORAL
  Filled 2021-07-13 (×6): qty 1

## 2021-07-13 MED ORDER — SODIUM CHLORIDE 0.9 % IV SOLN
2.0000 g | INTRAVENOUS | Status: AC
  Administered 2021-07-13 – 2021-07-17 (×5): 2 g via INTRAVENOUS
  Filled 2021-07-13 (×5): qty 20

## 2021-07-13 MED ORDER — SODIUM CHLORIDE 0.9 % IV SOLN: INTRAVENOUS | Status: AC

## 2021-07-13 MED ORDER — ACETAMINOPHEN 325 MG PO TABS: 650.0000 mg | ORAL_TABLET | Freq: Four times a day (QID) | ORAL | Status: DC | PRN

## 2021-07-13 MED ORDER — ACETAMINOPHEN 325 MG PO TABS: 650.0000 mg | ORAL_TABLET | Freq: Once | ORAL | Status: DC

## 2021-07-13 MED ORDER — NIRMATRELVIR/RITONAVIR (PAXLOVID)TABLET
2.0000 | ORAL_TABLET | Freq: Two times a day (BID) | ORAL | Status: AC
  Administered 2021-07-13 – 2021-07-18 (×10): 2 via ORAL
  Filled 2021-07-13: qty 30

## 2021-07-13 MED ORDER — PANTOPRAZOLE SODIUM 40 MG PO TBEC
40.0000 mg | DELAYED_RELEASE_TABLET | Freq: Every day | ORAL | Status: DC
  Administered 2021-07-14 – 2021-07-18 (×5): 40 mg via ORAL
  Filled 2021-07-13 (×5): qty 1

## 2021-07-13 MED ORDER — DM-GUAIFENESIN ER 30-600 MG PO TB12
1.0000 | ORAL_TABLET | Freq: Two times a day (BID) | ORAL | Status: DC
  Administered 2021-07-13 – 2021-07-18 (×10): 1 via ORAL
  Filled 2021-07-13 (×11): qty 1

## 2021-07-13 MED ORDER — ASPIRIN EC 81 MG PO TBEC
81.0000 mg | DELAYED_RELEASE_TABLET | Freq: Every day | ORAL | Status: DC
  Administered 2021-07-13 – 2021-07-18 (×6): 81 mg via ORAL
  Filled 2021-07-13 (×8): qty 1

## 2021-07-13 MED ORDER — ENOXAPARIN SODIUM 40 MG/0.4ML IJ SOSY
40.0000 mg | PREFILLED_SYRINGE | INTRAMUSCULAR | Status: DC
  Administered 2021-07-14 – 2021-07-17 (×4): 40 mg via SUBCUTANEOUS
  Filled 2021-07-13 (×5): qty 0.4

## 2021-07-13 MED ORDER — ACETAMINOPHEN 650 MG RE SUPP
650.0000 mg | Freq: Once | RECTAL | Status: AC
  Administered 2021-07-13: 650 mg via RECTAL
  Filled 2021-07-13: qty 1

## 2021-07-13 NOTE — ED Notes (Signed)
Pt bed linens and gown changed due to urinary and bowel incontinence.

## 2021-07-13 NOTE — ED Triage Notes (Addendum)
Patient reports fever, cough, congestion, generalized weakness for the past three days. Has sick contact. Wears oxygen 2L at home. Dementia at baseline, poor historian. Extremely HOH.

## 2021-07-13 NOTE — ED Provider Notes (Signed)
[facility] EMERGENCY DEPARTMENT Provider Note   CSN: [id number] Arrival date & time: 07/13/21  1704     History  Chief Complaint  Patient presents with   Shortness of Breath    Carrie Crawford is a 88 y.o. female.   Shortness of Breath  This patient is a 88 year old female, she has a known history of multiple medical problems including hypertension, COPD, congestive heart failure, dyslipidemia, congestive heart failure, and dementia.  Level 5 caveat applies and the patient is extremely hard of hearing  The patient arrives to the hospital with shortness of breath, there is evidently a sick contact, there is no family available for me to get a hold of and I have tried the phone numbers on the chart without answer.  There is no family at the bedside.  The patient is not able to answer any of my questions.  Nursing report is that the patient has had some progressive shortness of breath as well as coughing congestion, she is on 2 L of oxygen at home  Home Medications Prior to Admission medications   Medication Sig Start Date End Date Taking? Authorizing Provider  albuterol (PROVENTIL) (2.5 MG/3ML) 0.083% nebulizer solution Take 2.5 mg by nebulization every 4 (four) hours as needed. 10/29/20   [provider]  aspirin 81 MG tablet Take 81 mg by mouth daily.    [provider]  azelastine (ASTELIN) 0.1 % nasal spray 2 sprays in each nostril at bedtime. 09/16/17   Eustaquio Maize, MD  colestipol (COLESTID) 1 g tablet Take 1 g by mouth 2 (two) times daily. Patient not taking: Reported on 01/31/2021 10/29/20   [provider]  dextromethorphan-guaiFENesin (MUCINEX DM) 30-600 MG 12hr tablet Take 1 tablet by mouth 2 (two) times daily. 04/18/18   Mikhail, Velta Addison, DO  donepezil (ARICEPT) 5 MG tablet TAKE ONE TABLET AT BEDTIME 06/09/21   [name], FNP  fluticasone [facility]) 50 MCG/ACT nasal spray 2 SPRAYS IN EACH NOSTRIL QD 05/14/21   [name], FNP   furosemide (LASIX) 40 MG tablet TAKE ONE TABLET ONCE DAILY 05/28/21   [name], FNP  losartan (COZAAR) 100 MG tablet TAKE ONE TABLET AT BEDTIME 05/19/21   Evelina Dun A, FNP  potassium chloride (MICRO-K) 10 MEQ CR capsule TAKE TWO CAPSULES TWICE DAILY AS DIRECTED 06/20/21   [name], FNP  TRELEGY ELLIPTA 100-62.5-25 MCG/ACT AEPB INHALE 1 PUFF DAILY AS DIRECTED 05/19/21   Evelina Dun A, FNP  zinc oxide (MEIJER ZINC OXIDE) 20 % ointment Apply 1 application topically as needed for irritation. 11/01/20   [name], FNP      Allergies    Patient has no known allergies.    Review of Systems   Review of Systems  Unable to perform ROS: Dementia  Respiratory:  Positive for shortness of breath.    Physical Exam Updated Vital Signs BP (!) 128/46 (BP Location: Right Arm)    Pulse (!) 115    Temp 99.4 F (37.4 C) (Oral)    Resp (!) 28    Ht 1.626 m (5\' 4" )    Wt 86 kg    SpO2 90%    BMI 32.54 kg/m  Physical Exam Vitals and nursing note reviewed.  Constitutional:      General: She is in acute distress.     Appearance: She is well-developed.  HENT:     Head: Normocephalic and atraumatic.     Nose: No congestion or rhinorrhea.  Mouth/Throat:     Pharynx: No oropharyngeal exudate.  Eyes:     General: No scleral icterus.       Right eye: No discharge.        Left eye: No discharge.     Conjunctiva/sclera: Conjunctivae normal.     Pupils: Pupils are equal, round, and reactive to light.  Neck:     Thyroid: No thyromegaly.     Vascular: No JVD.  Cardiovascular:     Rate and Rhythm: Tachycardia present. Rhythm irregular.     Heart sounds: Normal heart sounds. No murmur heard.   No friction rub. No gallop.  Pulmonary:     Effort: Pulmonary effort is normal. No respiratory distress.     Breath sounds: Rhonchi and rales present. No wheezing.  Abdominal:     General: Bowel sounds are normal. There is distension.     Palpations: Abdomen is soft. There is no mass.      Tenderness: There is no abdominal tenderness.     Comments: Distended abdomen with tympanitic sounds to percussion but very soft and nontender, she does not grimace to palpation at all, there is no guarding and no pulsating mass  Musculoskeletal:        General: No tenderness. Normal range of motion.     Cervical back: Normal range of motion and neck supple.     Right lower leg: No edema.     Left lower leg: No edema.  Lymphadenopathy:     Cervical: No cervical adenopathy.  Skin:    General: Skin is warm and dry.     Findings: No erythema or rash.  Neurological:     Mental Status: She is alert.     Coordination: Coordination normal.     Comments: Extremely hard of hearing, the patient cannot sit up by herself, she can barely grip the side rails of the bed, she does appear to be away  Psychiatric:        Behavior: Behavior normal.    ED Results / Procedures / Treatments   Labs (all labs ordered are listed, but only abnormal results are displayed) Labs Reviewed  RESP PANEL BY RT-PCR (FLU A&B, COVID) ARPGX2  CULTURE, BLOOD (ROUTINE X 2)  CULTURE, BLOOD (ROUTINE X 2)  URINE CULTURE  LACTIC ACID, PLASMA  LACTIC ACID, PLASMA  COMPREHENSIVE METABOLIC PANEL  CBC WITH DIFFERENTIAL/PLATELET  PROTIME-INR  APTT  URINALYSIS, ROUTINE W REFLEX MICROSCOPIC    EKG EKG Interpretation  Date/Time:  Sunday July 13 2021 17:16:53 EST Ventricular Rate:  119 PR Interval:  191 QRS Duration: 83 QT Interval:  318 QTC Calculation: 427 R Axis:   -31 Text Interpretation: Sinus tachycardia Supraventricular bigeminy Left axis deviation Low voltage, precordial leads Abnormal R-wave progression, early transition Since last tracing irregularity n ow present, no ischemia Confirmed by Noemi Chapel [phone]) on 07/13/2021 5:33:08 PM  Radiology No results found.  Procedures .Critical Care Performed by: Noemi Chapel, MD Authorized by: Noemi Chapel, MD   Critical care provider statement:     Critical care time (minutes):  30   Critical care time was exclusive of:  Separately billable procedures and treating other patients and teaching time   Critical care was necessary to treat or prevent imminent or life-threatening deterioration of the following conditions:  Respiratory failure and sepsis   Critical care was time spent personally by me on the following activities:  Development of treatment plan with patient or surrogate, discussions with consultants, evaluation of patient's response to  treatment, examination of patient, ordering and review of laboratory studies, ordering and review of radiographic studies, ordering and performing treatments and interventions, pulse oximetry, re-evaluation of patient's condition, review of old charts and obtaining history from patient or surrogate   I assumed direction of critical care for this patient from another provider in my specialty: no     Care discussed with: admitting provider   Comments:          Medications Ordered in ED Medications  0.9 %  sodium chloride infusion (has no administration in time range)  cefTRIAXone (ROCEPHIN) 2 g in sodium chloride 0.9 % 100 mL IVPB (has no administration in time range)  azithromycin (ZITHROMAX) 500 mg in sodium chloride 0.9 % 250 mL IVPB (has no administration in time range)    ED Course/ Medical Decision Making/ A&P                           Medical Decision Making  This patient presents to the ED for concern of shortness of breath with congestion, possibly some increased work of breathing and hypoxia, she is tachycardic and has a tactile temperature, this involves an extensive number of treatment options, and is a complaint that carries with it a high risk of complications and morbidity.  The differential diagnosis includes sepsis, pneumonia, would also consider gastrointestinal problems as there is a foul smell in the room consistent with a diarrheal illness.  I do not see any diarrhea on the  patient.   Co morbidities that complicate the patient evaluation  Dementia, hypertension, COPD   Additional history obtained:  Additional history obtained from electronic medical record External records from outside source obtained and reviewed including followed for chronic care management, discharge summary from 2019, nothing recently done to suggest recent work-up.   Lab Tests:  I Ordered, and personally interpreted labs.  The pertinent results include: Significant leukocytosis, hyponatremia, lactic acid seems to be all right   Imaging Studies ordered:  I ordered imaging studies including portable chest x-ray I independently visualized and interpreted imaging which showed infection in bilateral lungs I agree with the radiologist interpretation   Cardiac Monitoring:  The patient was maintained on a cardiac monitor.  I personally viewed and interpreted the cardiac monitored which showed an underlying rhythm of: Sinus tachycardia   Medicines ordered and prescription drug management:  I ordered medication including antibiotics and IV fluid for sepsis Reevaluation of the patient after these medicines showed that the patient stayed the same I have reviewed the patients home medicines and have made adjustments as needed   Test Considered:  Considered CT scan of the chest however pneumonia seems the obvious answer to the symptoms   Critical Interventions:  IV antibiotics, IV fluids, cardiac monitoring Antipyretics, consultation with hospitalist, admission to the   Consultations Obtained:  I requested consultation with the hospital,  and discussed lab and imaging findings as well as pertinent plan - they recommend: Admission   Problem List / ED Course:  Sepsis, getting multiple different interventions and seeming to slowly improve   Reevaluation:  After the interventions noted above, I reevaluated the patient and found that they have  :improved   Dispostion:  After consideration of the diagnostic results and the patients response to treatment, I feel that the patent would benefit from admission to the hospital to high level of care, patient is critically ill.           Final Clinical  Impression(s) / ED Diagnoses Final diagnoses:  None    Rx / DC Orders ED Discharge Orders     None         Noemi Chapel, MD 07/13/21 1929

## 2021-07-13 NOTE — Progress Notes (Signed)
Elink following code sepsis. Antibiotics and blood cultures times noted. Will continue to monitor.

## 2021-07-13 NOTE — H&P (Signed)
History and Physical  Carrie Crawford UMP:[id number] DOB: 1926-03-09 DOA: 07/13/2021  Referring physician: Noemi Chapel, MD PCP: [name], FNP  Patient coming from: Home  Chief Complaint: Shortness of breath  HPI: Carrie Crawford is a 88 y.o. female with medical history significant for COPD on 2 LPM of oxygen at baseline, HFpEF, essential hypertension, hyperlipidemia who presents to the emergency department via EMS due to shortness of breath.  Patient was unable to provide history possibly due to the dementia, call was made to Carrie Crawford (son) but the phone was not answered, history was obtained from ED physician and ED medical record.  Per report, patient presents with 3-day onset of fever, cough, chest congestion and generalized weakness, she was reported to have had a sick contact, patient was hard of hearing.  ED Course:  In the emergency department, she was febrile with a temperature of 102F, tachypneic, tachycardic, BP 118/106.  Work-up in the ED showed leukocytosis, normocytic anemia, hyponatremia, hyperglycemia, hypoalbuminemia, urinalysis was positive for large leukocytes.  Influenza A, B was negative.  SARS coronavirus 2 was positive. Chest x-ray showed Cardiomegaly with central pulmonary vascular congestion/mild edema. Right lower lobe airspace disease, possibly pneumonia or aspiration. Small left pleural effusion. Patient was treated with IV ceftriaxone and azithromycin, IV hydration was provided and Paxlovid was started.  Review of Systems: This cannot be obtained from patient at this time due to dementia  Past Medical History:  Diagnosis Date   Arthritis    "spine" (03/16/2018)   CHF (congestive heart failure) (HCC)    Chronic bronchitis (HCC)    Chronic kidney disease    Compression fracture of lumbar vertebra (HCC)    COPD (chronic obstructive pulmonary disease) (HCC)    Dyslipidemia    Hypertension    Multiple allergies    Osteopenia    Rhinitis, chronic     Past Surgical History:  Procedure Laterality Date   CATARACT EXTRACTION W/ INTRAOCULAR LENS  IMPLANT, BILATERAL Bilateral    CHOLECYSTECTOMY     FRACTURE SURGERY     OPEN REDUCTION INTERNAL FIXATION (ORIF) DISTAL RADIAL FRACTURE Right 03/16/2018   ORIF FEMUR FRACTURE Right 03/16/2018   Procedure: OPEN REDUCTION INTERNAL FIXATION (ORIF) DISTAL FEMUR FRACTURE;  Surgeon: Shona Needles, MD;  Location: [facility];  Service: Orthopedics;  Laterality: Right;   THYROIDECTOMY, PARTIAL  2004   [place]    Social History:  reports that she has never smoked. She has never used smokeless tobacco. She reports that she does not drink alcohol and does not use drugs.   No Known Allergies  Family History  Problem Relation Age of Onset   Heart disease Brother    Cancer Daughter        endometial/uterine cancer(nodule in right lung)     Prior to Admission medications   Medication Sig Start Date End Date Taking? Authorizing Provider  albuterol (PROVENTIL) (2.5 MG/3ML) 0.083% nebulizer solution Take 2.5 mg by nebulization every 4 (four) hours as needed. 10/29/20   [provider]  aspirin 81 MG tablet Take 81 mg by mouth daily.    [provider]  azelastine (ASTELIN) 0.1 % nasal spray 2 sprays in each nostril at bedtime. 09/16/17   Carrie Maize, MD  colestipol (COLESTID) 1 g tablet Take 1 g by mouth 2 (two) times daily. Patient not taking: Reported on 01/31/2021 10/29/20   [provider]  dextromethorphan-guaiFENesin (MUCINEX DM) 30-600 MG 12hr tablet Take 1 tablet by mouth 2 (two) times daily.  04/18/18   Mikhail, Velta Addison, DO  donepezil (ARICEPT) 5 MG tablet TAKE ONE TABLET AT BEDTIME 06/09/21   [name], FNP  fluticasone [facility]) 50 MCG/ACT nasal spray 2 SPRAYS IN EACH NOSTRIL QD 05/14/21   [name], FNP  furosemide (LASIX) 40 MG tablet TAKE ONE TABLET ONCE DAILY 05/28/21   [name], FNP  losartan (COZAAR) 100 MG tablet TAKE ONE TABLET AT BEDTIME  05/19/21   Evelina Dun A, FNP  potassium chloride (MICRO-K) 10 MEQ CR capsule TAKE TWO CAPSULES TWICE DAILY AS DIRECTED 06/20/21   [name], FNP  TRELEGY ELLIPTA 100-62.5-25 MCG/ACT AEPB INHALE 1 PUFF DAILY AS DIRECTED 05/19/21   Evelina Dun A, FNP  zinc oxide (MEIJER ZINC OXIDE) 20 % ointment Apply 1 application topically as needed for irritation. 11/01/20   [name], FNP    Physical Exam: BP (!) 137/51    Pulse (!) 117    Temp 100.2 F (37.9 C)    Resp (!) 24    Ht 5\' 4"  (1.626 m)    Wt 86 kg    SpO2 98%    BMI 32.54 kg/m   General: 88 y.o. year-old female ill appearing but in no acute distress.  Alert and awake  HEENT: NCAT, EOMI Neck: Supple, trachea medial Cardiovascular: Tachycardia, irregular rate and rhythm with no rubs or gallops.  No thyromegaly or JVD noted.  No lower extremity edema. 2/4 pulses in all 4 extremities. Respiratory: Clear to auscultation with no wheezes or rales. Good inspiratory effort. Abdomen: Soft, nontender nondistended with normal bowel sounds x4 quadrants. Muskuloskeletal: No cyanosis, clubbing or edema noted bilaterally Neuro: CN II-XII intact, hard of hearing, sensation, reflexes intact Skin: No ulcerative lesions noted or rashes Psychiatry: Mood is appropriate for condition and setting          Labs on Admission:  Basic Metabolic Panel: Recent Labs  Lab 07/13/21 1803  NA 122*  K 3.7  CL 85*  CO2 27  GLUCOSE 149*  BUN 15  CREATININE 0.78  CALCIUM 7.9*   Liver Function Tests: Recent Labs  Lab 07/13/21 1803  AST 17  ALT 11  ALKPHOS 71  BILITOT 0.3  PROT 7.4  ALBUMIN 2.7*   No results for input(s): LIPASE, AMYLASE in the last 168 hours. No results for input(s): AMMONIA in the last 168 hours. CBC: Recent Labs  Lab 07/13/21 1803  WBC 19.5*  NEUTROABS 16.6*  HGB 10.6*  HCT 30.7*  MCV 95.0  PLT 273   Cardiac Enzymes: No results for input(s): CKTOTAL, CKMB, CKMBINDEX, TROPONINI in the last 168 hours.  BNP  (last 3 results) No results for input(s): BNP in the last 8760 hours.  ProBNP (last 3 results) No results for input(s): PROBNP in the last 8760 hours.  CBG: No results for input(s): GLUCAP in the last 168 hours.  Radiological Exams on Admission: DG Chest Port 1 View  Result Date: 07/13/2021 CLINICAL DATA:  Questionable sepsis. EXAM: PORTABLE CHEST 1 VIEW COMPARISON:  Chest x-ray 03/15/2018. FINDINGS: The aorta is tortuous with atherosclerotic calcifications. The heart is mildly enlarged, unchanged. There is central pulmonary vascular congestion. There are patchy airspace opacities in the right lung base. There is a small left pleural effusion. There is no evidence for pneumothorax or acute fracture. IMPRESSION: 1. Cardiomegaly with central pulmonary vascular congestion/mild edema. 2. Right lower lobe airspace disease, possibly pneumonia or aspiration. 3. Small left pleural effusion. Electronically Signed   By: Tina Griffiths.D.  On: 07/13/2021 18:13    EKG: I independently viewed the EKG done and my findings are as followed: Sinus tachycardia at a rate of 119 bpm with supraventricular bigeminy  Assessment/Plan Present on Admission:  Pneumonia due to COVID-19 virus  Hyponatremia  Hyperglycemia  Diastolic heart failure (HCC)  Obesity (BMI 30-39.9)  Principal Problem:   Pneumonia due to COVID-19 virus Active Problems:   Essential hypertension   Diastolic heart failure (HCC)   Obesity (BMI 30-39.9)   Hyperglycemia   Hyponatremia   CAP (community acquired pneumonia)   Leukocytosis   Hypoalbuminemia due to protein-calorie malnutrition (HCC)   Chronic respiratory failure with hypoxia (HCC)  COVID-19 virus pneumonia with possible superimposed CAP POA Continue atrovent q.6h Continue vitamin-C 500 mg p.o. Daily Continue zinc 220 mg p.o. Daily Continue Mucinex, Robitussin and Tussionex Continue Tylenol p.r.n. for fever Patient has chronic with hypoxia Patient uses supplemental  oxygen at 2 LPM at baseline, she continues to require same amount of oxygen at this time. Continue incentive spirometry and flutter valve q71min as tolerated Encourage proning, early ambulation, and side laying as tolerated Continue airborne isolation precaution Continue IV ceftriaxone and azithromycin we will plan to de-escalate based on blood culture, sputum culture, urine Legionella, strep pneumo and procalcitonin  Leukocytosis possibly secondary to above Continue treatment as described above  Chronic hyponatremia Na 122; Diuretics may be contributing factor Continue gentle hydration Continue to monitor sodium with serial BMPs Urine osmolality, serum osmolality and urine sodium will be checked  Hyperglycemia possibly due to reactive process CBG 149, continue to monitor blood glucose levels with morning labs  Hypoalbuminemia possibly secondary to moderate protein calorie malnutrition Albumin 2.7, protein supplement to be provided  Chronic respiratory failure with hypoxia due to COPD Continue Atrovent, incentive spirometry and flutter valve Continue supplemental oxygen per home regimen  Essential hypertension Continue losartan  HFpEF LVEF done in the 2019 showed LVEF of 60 to 78% with LV diastolic parameters showing G1 DD Patient does not appear fluid overloaded Continue aspirin, losartan; Continue to monitor  Dementia Continue Aricept  Obesity (BMI 32.54 kg/m) Diet modification  DVT prophylaxis: Lovenox  Code Status: Full code  Family Communication: None at bedside  Disposition Plan:  Patient is from:                        home Anticipated DC to:                   SNF or family members home Anticipated DC date:               2-3 days Anticipated DC barriers:          Patient requires inpatient management due to severity of symptoms  Consults called: None  Admission status: Inpatient    Bernadette Hoit MD [facility]  07/13/2021, 9:07 PM

## 2021-07-13 NOTE — Progress Notes (Signed)
Elink following code sepsis °

## 2021-07-14 LAB — COMPREHENSIVE METABOLIC PANEL
ALT: 9 U/L (ref 0–44)
AST: 16 U/L (ref 15–41)
Albumin: 2.4 g/dL — ABNORMAL LOW (ref 3.5–5.0)
Alkaline Phosphatase: 63 U/L (ref 38–126)
Anion gap: 7 (ref 5–15)
BUN: 14 mg/dL (ref 8–23)
CO2: 27 mmol/L (ref 22–32)
Calcium: 7.4 mg/dL — ABNORMAL LOW (ref 8.9–10.3)
Chloride: 91 mmol/L — ABNORMAL LOW (ref 98–111)
Creatinine, Ser: 0.68 mg/dL (ref 0.44–1.00)
GFR, Estimated: >60 mL/min (ref >60)
Glucose, Bld: 127 mg/dL — ABNORMAL HIGH (ref 70–99)
Potassium: 3.3 mmol/L — ABNORMAL LOW (ref 3.5–5.1)
Sodium: 125 mmol/L — ABNORMAL LOW (ref 135–145)
Total Bilirubin: 0.4 mg/dL (ref 0.3–1.2)
Total Protein: 6.8 g/dL (ref 6.5–8.1)

## 2021-07-14 LAB — CBC WITH DIFFERENTIAL/PLATELET
Abs Immature Granulocytes: 0.2 10*3/uL — ABNORMAL HIGH (ref 0.00–0.07)
Basophils Absolute: 0 10*3/uL (ref 0.0–0.1)
Basophils Relative: 0 %
Eosinophils Absolute: 0 10*3/uL (ref 0.0–0.5)
Eosinophils Relative: 0 %
HCT: 29.3 % — ABNORMAL LOW (ref 36.0–46.0)
Hemoglobin: 9.8 g/dL — ABNORMAL LOW (ref 12.0–15.0)
Immature Granulocytes: 1 %
Lymphocytes Relative: 9 %
Lymphs Abs: 1.4 10*3/uL (ref 0.7–4.0)
MCH: 31.7 pg (ref 26.0–34.0)
MCHC: 33.4 g/dL (ref 30.0–36.0)
MCV: 94.8 fL (ref 80.0–100.0)
Monocytes Absolute: 1.3 10*3/uL — ABNORMAL HIGH (ref 0.1–1.0)
Monocytes Relative: 8 %
Neutro Abs: 13.4 10*3/uL — ABNORMAL HIGH (ref 1.7–7.7)
Neutrophils Relative %: 82 %
Platelets: 256 10*3/uL (ref 150–400)
RBC: 3.09 MIL/uL — ABNORMAL LOW (ref 3.87–5.11)
RDW: 12.5 % (ref 11.5–15.5)
WBC: 16.4 10*3/uL — ABNORMAL HIGH (ref 4.0–10.5)
nRBC: 0 % (ref 0.0–0.2)

## 2021-07-14 LAB — BASIC METABOLIC PANEL
Anion gap: 8 (ref 5–15)
BUN: 13 mg/dL (ref 8–23)
CO2: 28 mmol/L (ref 22–32)
Calcium: 7.5 mg/dL — ABNORMAL LOW (ref 8.9–10.3)
Chloride: 91 mmol/L — ABNORMAL LOW (ref 98–111)
Creatinine, Ser: 0.71 mg/dL (ref 0.44–1.00)
GFR, Estimated: >60 mL/min (ref >60)
Glucose, Bld: 138 mg/dL — ABNORMAL HIGH (ref 70–99)
Potassium: 3.3 mmol/L — ABNORMAL LOW (ref 3.5–5.1)
Sodium: 127 mmol/L — ABNORMAL LOW (ref 135–145)

## 2021-07-14 LAB — OSMOLALITY: Osmolality: 268 mOsm/kg — ABNORMAL LOW (ref 275–295)

## 2021-07-14 LAB — FERRITIN: Ferritin: 222 ng/mL (ref 11–307)

## 2021-07-14 LAB — MAGNESIUM: Magnesium: 1.7 mg/dL (ref 1.7–2.4)

## 2021-07-14 LAB — STREP PNEUMONIAE URINARY ANTIGEN: Strep Pneumo Urinary Antigen: NEGATIVE

## 2021-07-14 LAB — D-DIMER, QUANTITATIVE: D-Dimer, Quant: 1.84 ug/mL-FEU — ABNORMAL HIGH (ref 0.00–0.50)

## 2021-07-14 LAB — PROCALCITONIN: Procalcitonin: 0.35 ng/mL

## 2021-07-14 LAB — C-REACTIVE PROTEIN: CRP: 24.9 mg/dL — ABNORMAL HIGH (ref <1.0)

## 2021-07-14 LAB — OSMOLALITY, URINE: Osmolality, Ur: 417 mOsm/kg (ref 300–900)

## 2021-07-14 LAB — PHOSPHORUS: Phosphorus: 2.3 mg/dL — ABNORMAL LOW (ref 2.5–4.6)

## 2021-07-14 MED ORDER — IPRATROPIUM BROMIDE HFA 17 MCG/ACT IN AERS
2.0000 | INHALATION_SPRAY | Freq: Four times a day (QID) | RESPIRATORY_TRACT | Status: DC | PRN
  Filled 2021-07-14: qty 12.9

## 2021-07-14 MED ORDER — IPRATROPIUM-ALBUTEROL 0.5-2.5 (3) MG/3ML IN SOLN
3.0000 mL | Freq: Three times a day (TID) | RESPIRATORY_TRACT | Status: DC
  Administered 2021-07-14 – 2021-07-18 (×11): 3 mL via RESPIRATORY_TRACT
  Filled 2021-07-14 (×13): qty 3

## 2021-07-14 NOTE — Progress Notes (Signed)
PROGRESS NOTE     Carrie Crawford, is a 88 y.o. female, DOB - 1925-09-13, ACZ:[id number]  Admit date - 07/13/2021   Admitting Physician Bernadette Hoit, DO  Outpatient Primary MD for the patient is [name], FNP  LOS - 1  Chief Complaint  Patient presents with   Shortness of Breath        Brief Narrative:  88 y.o. female with medical history significant for COPD on 2 LPM of oxygen at baseline, HFpEF, essential hypertension, hyperlipidemia   Assessment & Plan:   Principal Problem:   Pneumonia due to COVID-19 virus Active Problems:   Essential hypertension   Diastolic heart failure (HCC)   Obesity (BMI 30-39.9)   Hyperglycemia   Hyponatremia   CAP (community acquired pneumonia)   Leukocytosis   Hypoalbuminemia due to protein-calorie malnutrition (HCC)   Chronic respiratory failure with hypoxia (HCC)   COVID-19 virus pneumonia---POA Continue atrovent q.6h Continue vitamin-C 500 mg p.o. Daily Continue zinc 220 mg p.o. Daily Continue Mucinex, Robitussin and Tussionex Continue Tylenol p.r.n. for fever Patient uses supplemental oxygen at 2 LPM at baseline,  -Strep pneumo antigen is negative -Legionella antigen pending   -Possible UTI--continue IV Rocephin pending culture data   Chronic hyponatremia Na 122 >> 127  Diuretics may be contributing factor Continue gentle hydration Continue to monitor sodium with serial BMPs Urine osmolality, serum osmolality and urine sodium will be checked  Hyperglycemia possibly due to reactive process CBG 149, continue to monitor blood glucose levels with morning labs  Hypoalbuminemia possibly secondary to moderate protein calorie malnutrition Albumin 2.7, protein supplement to be provided  COPD with acute on chronic hypoxic respiratory failure secondary to COVID-19 infection -At baseline patient uses 2 L of nasal cannula Continue Atrovent, incentive spirometry and flutter valve Continue supplemental oxygen     Essential  hypertension Continue losartan  HFpEF LVEF done in the 2019 showed LVEF of 60 to 10% with LV diastolic parameters showing G1 DD Patient does not appear fluid overloaded Continue aspirin, losartan; Continue to monitor  Dementia Continue Aricept  Social/Ethics-- Discussed with son Mr Tiana Sivertson , he confirms DNR/DNI status --- Generalized weakness and ambulatory dysfunction--at baseline patient is bedbound requires a hoyer lift for transfer -Patient has around-the-clock caregivers at home according to patient's son [name]  Disposition/Need for in-Hospital Stay- patient unable to be discharged at this time due to acute on chronic hypoxic respiratory failure secondary to COVID-19 infection compounded by presumed UTI requiring IV antibiotics IV steroids and supportive care -  Status is: Inpatient  Remains inpatient appropriate because:   Disposition: The patient is from: Home              Anticipated d/c is to: Home              Anticipated d/c date is: 2 days              Patient currently is not medically stable to d/c. Barriers: Not Clinically Stable-   Code Status :  -  Code Status: Full Code   Family Communication:   (patient is alert, awake and coherent) Discussed with son [name]  Consults  :  na  DVT Prophylaxis  :   - SCDs   enoxaparin (LOVENOX) injection 40 mg Start: 07/13/21 2030 SCDs Start: 07/13/21 2019    Lab Results  Component Value Date   PLT 256 07/14/2021    Inpatient Medications  Scheduled Meds:  acetaminophen  650 mg Oral Once   vitamin C  500 mg Oral Daily   aspirin EC  81 mg Oral Daily   dextromethorphan-guaiFENesin  1 tablet Oral BID   donepezil  5 mg Oral QHS   enoxaparin (LOVENOX) injection  40 mg Subcutaneous Q24H   feeding supplement  237 mL Oral BID BM   ipratropium  2 puff Inhalation Q6H   losartan  100 mg Oral QHS   nirmatrelvir/ritonavir EUA  2 tablet Oral BID   pantoprazole  40 mg Oral Daily   zinc sulfate  220 mg Oral Daily    Continuous Infusions:  azithromycin 500 mg (07/13/21 1929)   cefTRIAXone (ROCEPHIN)  IV 2 g (07/14/21 1728)   PRN Meds:.acetaminophen, chlorpheniramine-HYDROcodone, guaiFENesin-dextromethorphan   Anti-infectives (From admission, onward)    Start     Dose/Rate Route Frequency Ordered Stop   07/13/21 2200  nirmatrelvir/ritonavir EUA (PAXLOVID) 2 tablet        2 tablet Oral 2 times daily 07/13/21 1946 07/18/21 2159   07/13/21 1745  cefTRIAXone (ROCEPHIN) 2 g in sodium chloride 0.9 % 100 mL IVPB        2 g 200 mL/hr over 30 Minutes Intravenous Every 24 hours 07/13/21 1738 07/18/21 1744   07/13/21 1745  azithromycin (ZITHROMAX) 500 mg in sodium chloride 0.9 % 250 mL IVPB        500 mg 250 mL/hr over 60 Minutes Intravenous Every 24 hours 07/13/21 1738 07/18/21 1744         Subjective: [name] today has  no emesis,  No chest pain,    -No further fevers, lethargic -Cough and dyspnea persist, hypoxia persist  Objective: Vitals:   07/14/21 0637 07/14/21 0737 07/14/21 1403 07/14/21 1437  BP: (!) 156/61   (!) 139/58  Pulse: 92   84  Resp: (!) 23     Temp: (!) 97.5 F (36.4 C)     TempSrc: Oral     SpO2: 95% 95% 95% 93%  Weight:      Height:        Intake/Output Summary (Last 24 hours) at 07/14/2021 1822 Last data filed at 07/14/2021 1500 Gross per 24 hour  Intake 2879.7 ml  Output 500 ml  Net 2379.7 ml   Filed Weights   07/13/21 1712  Weight: 86 kg     Physical Exam  Gen:-Sleepy and ill-appearing  HEENT:- [place].AT, No sclera icterus Nose-3L/min Neck-Supple Neck,No JVD,.  Lungs-diminished breath sounds, scattered wheezes bilaterally  CV- S1, S2 normal, regular  Abd-  +ve B.Sounds, Abd Soft, No tenderness,    Extremity/Skin:-  pedal pulses present  Psych-underlying memory and cognitive deficits at baseline neuro-generalized weakness, no new focal deficits, no tremors  Data Reviewed: I have personally reviewed following labs and imaging studies  CBC: Recent  Labs  Lab 07/13/21 1803 07/14/21 0631  WBC 19.5* 16.4*  NEUTROABS 16.6* 13.4*  HGB 10.6* 9.8*  HCT 30.7* 29.3*  MCV 95.0 94.8  PLT 273 009   Basic Metabolic Panel: Recent Labs  Lab 07/13/21 1803 07/14/21 0631 07/14/21 0941  NA 122* 125* 127*  K 3.7 3.3* 3.3*  CL 85* 91* 91*  CO2 27 27 28   GLUCOSE 149* 127* 138*  BUN 15 14 13   CREATININE 0.78 0.68 0.71  CALCIUM 7.9* 7.4* 7.5*  MG  --  1.7  --   PHOS  --  2.3*  --    GFR: Estimated Creatinine Clearance: 46.6 mL/min (by C-G formula based on SCr of 0.71 mg/dL). Liver Function Tests: Recent Labs  Lab 07/13/21 1803  07/14/21 0631  AST 17 16  ALT 11 9  ALKPHOS 71 63  BILITOT 0.3 0.4  PROT 7.4 6.8  ALBUMIN 2.7* 2.4*   No results for input(s): LIPASE, AMYLASE in the last 168 hours. No results for input(s): AMMONIA in the last 168 hours. Coagulation Profile: Recent Labs  Lab 07/13/21 1803  INR 1.3*   Cardiac Enzymes: No results for input(s): CKTOTAL, CKMB, CKMBINDEX, TROPONINI in the last 168 hours. BNP (last 3 results) No results for input(s): PROBNP in the last 8760 hours. HbA1C: No results for input(s): HGBA1C in the last 72 hours. CBG: No results for input(s): GLUCAP in the last 168 hours. Lipid Profile: No results for input(s): CHOL, HDL, LDLCALC, TRIG, CHOLHDL, LDLDIRECT in the last 72 hours. Thyroid Function Tests: No results for input(s): TSH, T4TOTAL, FREET4, T3FREE, THYROIDAB in the last 72 hours. Anemia Panel: Recent Labs    07/14/21 0631  FERRITIN 222   Urine analysis:    Component Value Date/Time   COLORURINE YELLOW 07/13/2021 1850   APPEARANCEUR CLOUDY (A) 07/13/2021 1850   LABSPEC 1.011 07/13/2021 1850   PHURINE 8.0 07/13/2021 1850   GLUCOSEU NEGATIVE 07/13/2021 1850   GLUCOSEU NEGATIVE 04/07/2016 1525   HGBUR SMALL (A) 07/13/2021 1850   BILIRUBINUR NEGATIVE 07/13/2021 1850   BILIRUBINUR negative 04/07/2016 1408   KETONESUR NEGATIVE 07/13/2021 1850   PROTEINUR 30 (A) 07/13/2021 1850    UROBILINOGEN 0.2 04/07/2016 1525   UROBILINOGEN 0.2 04/07/2016 1408   NITRITE NEGATIVE 07/13/2021 1850   LEUKOCYTESUR LARGE (A) 07/13/2021 1850   Sepsis Labs: @LABRCNTIP (procalcitonin:4,lacticidven:4)  ) Recent Results (from the past 240 hour(s))  Resp Panel by RT-PCR (Flu A&B, Covid) Nasopharyngeal Swab     Status: Abnormal   Collection Time: 07/13/21  6:15 PM   Specimen: Nasopharyngeal Swab; Nasopharyngeal(NP) swabs in vial transport medium  Result Value Ref Range Status   SARS Coronavirus 2 by RT PCR POSITIVE (A) NEGATIVE Final    Comment: (NOTE) SARS-CoV-2 target nucleic acids are DETECTED.  The SARS-CoV-2 RNA is generally detectable in upper respiratory specimens during the acute phase of infection. Positive results are indicative of the presence of the identified virus, but do not rule out bacterial infection or co-infection with other pathogens not detected by the test. Clinical correlation with patient history and other diagnostic information is necessary to determine patient infection status. The expected result is Negative.  Fact Sheet for Patients: EntrepreneurPulse.com.au  Fact Sheet for Healthcare Providers: IncredibleEmployment.be  This test is not yet approved or cleared by the [place] FDA and  has been authorized for detection and/or diagnosis of SARS-CoV-2 by FDA under an Emergency Use Authorization (EUA).  This EUA will remain in effect (meaning this test can be used) for the duration of  the COVID-19 declaration under Section 564(b)(1) of the A ct, 21 U.S.C. section 360bbb-3(b)(1), unless the authorization is terminated or revoked sooner.     Influenza A by PCR NEGATIVE NEGATIVE Final   Influenza B by PCR NEGATIVE NEGATIVE Final    Comment: (NOTE) The Xpert Xpress SARS-CoV-2/FLU/RSV plus assay is intended as an aid in the diagnosis of influenza from Nasopharyngeal swab specimens and should not be used as a  sole basis for treatment. Nasal washings and aspirates are unacceptable for Xpert Xpress SARS-CoV-2/FLU/RSV testing.  Fact Sheet for Patients: EntrepreneurPulse.com.au  Fact Sheet for Healthcare Providers: IncredibleEmployment.be  This test is not yet approved or cleared by the [place] FDA and has been authorized for detection and/or diagnosis of SARS-CoV-2 by  FDA under an Emergency Use Authorization (EUA). This EUA will remain in effect (meaning this test can be used) for the duration of the COVID-19 declaration under Section 564(b)(1) of the Act, 21 U.S.C. section 360bbb-3(b)(1), unless the authorization is terminated or revoked.  Performed at [facility], [address] [place], [place] [address]   Blood Culture (routine x 2)     Status: None (Preliminary result)   Collection Time: 07/13/21  6:20 PM   Specimen: BLOOD RIGHT HAND  Result Value Ref Range Status   Specimen Description   Final    BLOOD RIGHT HAND BOTTLES DRAWN AEROBIC AND ANAEROBIC   Special Requests Blood Culture adequate volume  Final   Culture   Final    NO GROWTH < 24 HOURS Performed at [facility], [address] [place], [place] [address]    Report Status PENDING  Incomplete  Blood Culture (routine x 2)     Status: None (Preliminary result)   Collection Time: 07/13/21  6:20 PM   Specimen: BLOOD RIGHT FOREARM  Result Value Ref Range Status   Specimen Description   Final    BLOOD RIGHT FOREARM BOTTLES DRAWN AEROBIC AND ANAEROBIC   Special Requests   Final    Blood Culture results may not be optimal due to an inadequate volume of blood received in culture bottles   Culture   Final    NO GROWTH < 24 HOURS Performed at [facility], [address], [place], [place] [address]    Report Status PENDING  Incomplete     Radiology Studies: DG Chest Port 1 View  Result Date: 07/13/2021 CLINICAL DATA:  Questionable sepsis. EXAM: PORTABLE CHEST 1 VIEW COMPARISON:  Chest  x-ray 03/15/2018. FINDINGS: The aorta is tortuous with atherosclerotic calcifications. The heart is mildly enlarged, unchanged. There is central pulmonary vascular congestion. There are patchy airspace opacities in the right lung base. There is a small left pleural effusion. There is no evidence for pneumothorax or acute fracture. IMPRESSION: 1. Cardiomegaly with central pulmonary vascular congestion/mild edema. 2. Right lower lobe airspace disease, possibly pneumonia or aspiration. 3. Small left pleural effusion. Electronically Signed   By: Ronney Asters M.D.   On: 07/13/2021 18:13    Scheduled Meds:  acetaminophen  650 mg Oral Once   vitamin C  500 mg Oral Daily   aspirin EC  81 mg Oral Daily   dextromethorphan-guaiFENesin  1 tablet Oral BID   donepezil  5 mg Oral QHS   enoxaparin (LOVENOX) injection  40 mg Subcutaneous Q24H   feeding supplement  237 mL Oral BID BM   ipratropium  2 puff Inhalation Q6H   losartan  100 mg Oral QHS   nirmatrelvir/ritonavir EUA  2 tablet Oral BID   pantoprazole  40 mg Oral Daily   zinc sulfate  220 mg Oral Daily   Continuous Infusions:  azithromycin 500 mg (07/13/21 1929)   cefTRIAXone (ROCEPHIN)  IV 2 g (07/14/21 1728)     LOS: 1 day    Roxan Hockey M.D on 07/14/2021 at 6:22 PM  Go to [email or web address] - for contact info  [facility] - Office  [phone]  If 7PM-7AM, please contact night-coverage [email or web address] Password [facility] 07/14/2021, 6:22 PM

## 2021-07-15 LAB — COMPREHENSIVE METABOLIC PANEL
ALT: 12 U/L (ref 0–44)
AST: 19 U/L (ref 15–41)
Albumin: 2.4 g/dL — ABNORMAL LOW (ref 3.5–5.0)
Alkaline Phosphatase: 66 U/L (ref 38–126)
Anion gap: 10 (ref 5–15)
BUN: 12 mg/dL (ref 8–23)
CO2: 25 mmol/L (ref 22–32)
Calcium: 7.6 mg/dL — ABNORMAL LOW (ref 8.9–10.3)
Chloride: 95 mmol/L — ABNORMAL LOW (ref 98–111)
Creatinine, Ser: 0.58 mg/dL (ref 0.44–1.00)
GFR, Estimated: >60 mL/min (ref >60)
Glucose, Bld: 112 mg/dL — ABNORMAL HIGH (ref 70–99)
Potassium: 2.8 mmol/L — ABNORMAL LOW (ref 3.5–5.1)
Sodium: 130 mmol/L — ABNORMAL LOW (ref 135–145)
Total Bilirubin: 0.3 mg/dL (ref 0.3–1.2)
Total Protein: 6.8 g/dL (ref 6.5–8.1)

## 2021-07-15 LAB — C-REACTIVE PROTEIN: CRP: 27.8 mg/dL — ABNORMAL HIGH (ref <1.0)

## 2021-07-15 LAB — PHOSPHORUS: Phosphorus: 2 mg/dL — ABNORMAL LOW (ref 2.5–4.6)

## 2021-07-15 LAB — CBC WITH DIFFERENTIAL/PLATELET
Abs Immature Granulocytes: 0.1 10*3/uL — ABNORMAL HIGH (ref 0.00–0.07)
Basophils Absolute: 0 10*3/uL (ref 0.0–0.1)
Basophils Relative: 0 %
Eosinophils Absolute: 0 10*3/uL (ref 0.0–0.5)
Eosinophils Relative: 0 %
HCT: 28.7 % — ABNORMAL LOW (ref 36.0–46.0)
Hemoglobin: 9.6 g/dL — ABNORMAL LOW (ref 12.0–15.0)
Immature Granulocytes: 1 %
Lymphocytes Relative: 9 %
Lymphs Abs: 1.2 10*3/uL (ref 0.7–4.0)
MCH: 32.4 pg (ref 26.0–34.0)
MCHC: 33.4 g/dL (ref 30.0–36.0)
MCV: 97 fL (ref 80.0–100.0)
Monocytes Absolute: 1.3 10*3/uL — ABNORMAL HIGH (ref 0.1–1.0)
Monocytes Relative: 9 %
Neutro Abs: 11.6 10*3/uL — ABNORMAL HIGH (ref 1.7–7.7)
Neutrophils Relative %: 81 %
Platelets: 273 10*3/uL (ref 150–400)
RBC: 2.96 MIL/uL — ABNORMAL LOW (ref 3.87–5.11)
RDW: 12.9 % (ref 11.5–15.5)
WBC: 14.3 10*3/uL — ABNORMAL HIGH (ref 4.0–10.5)
nRBC: 0 % (ref 0.0–0.2)

## 2021-07-15 LAB — LEGIONELLA PNEUMOPHILA SEROGP 1 UR AG: L. pneumophila Serogp 1 Ur Ag: NEGATIVE

## 2021-07-15 LAB — D-DIMER, QUANTITATIVE: D-Dimer, Quant: 1.6 ug/mL-FEU — ABNORMAL HIGH (ref 0.00–0.50)

## 2021-07-15 LAB — FERRITIN: Ferritin: 279 ng/mL (ref 11–307)

## 2021-07-15 LAB — MAGNESIUM: Magnesium: 1.9 mg/dL (ref 1.7–2.4)

## 2021-07-15 MED ORDER — POTASSIUM CHLORIDE CRYS ER 20 MEQ PO TBCR
40.0000 meq | EXTENDED_RELEASE_TABLET | ORAL | Status: AC
  Administered 2021-07-15 (×2): 40 meq via ORAL
  Filled 2021-07-15 (×2): qty 2

## 2021-07-15 MED ORDER — MAGNESIUM SULFATE 2 GM/50ML IV SOLN
2.0000 g | Freq: Once | INTRAVENOUS | Status: AC
  Administered 2021-07-15: 2 g via INTRAVENOUS
  Filled 2021-07-15: qty 50

## 2021-07-15 NOTE — Progress Notes (Signed)
PROGRESS NOTE     Carrie Crawford, is a 88 y.o. female, DOB - 31-May-1926, GLO:[id number]  Admit date - 07/13/2021   Admitting Physician Carrie Hoit, DO  Outpatient Primary MD for the patient is Carrie Perking, FNP  LOS - 2  Chief Complaint  Patient presents with   Shortness of Breath        Brief Narrative:  88 y.o. female with medical history significant for COPD on 2 LPM of oxygen at baseline, HFpEF, HTN, hyperlipidemia   Assessment & Plan:   Principal Problem:   Pneumonia due to COVID-19 virus Active Problems:   Essential hypertension   Diastolic heart failure (HCC)   Obesity (BMI 30-39.9)   Hyperglycemia   Hyponatremia   CAP (community acquired pneumonia)   Leukocytosis   Hypoalbuminemia due to protein-calorie malnutrition (HCC)   Chronic respiratory failure with hypoxia (HCC)   COVID-19 virus pneumonia---POA Continue atrovent q.6h Continue vitamin-C 500 mg p.o. Daily Continue zinc 220 mg p.o. Daily Continue Mucinex, Robitussin and Tussionex Continue Tylenol p.r.n. for fever Patient uses supplemental oxygen at 2 LPM at baseline,  -Strep pneumo antigen is negative -Legionella antigen Neg   -GNR UTI--continue IV Rocephin pending culture data   Acute on Chronic hyponatremia Na 122 >> 127>130  Lasix may be contributing factor -Lasix remains on hold -Avoid dehydration  -encourage increased intake  Hypoalbuminemia possibly secondary to moderate protein calorie malnutrition Albumin 2.7, protein supplement to be provided  COPD with acute on chronic hypoxic respiratory failure secondary to COVID-19 infection -At baseline patient uses 2 L of nasal cannula Continue Atrovent, incentive spirometry and flutter valve Continue supplemental oxygen     Essential hypertension Continue losartan  HFpEF LVEF done in the 2019 showed LVEF of 60 to 18% with LV diastolic parameters showing G1 DD Patient does not appear fluid overloaded Continue aspirin, losartan;   -Continue to hold Lasix   Dementia Continue Aricept  Social/Ethics-- Discussed with son Mr Carrie Crawford , he confirms DNR/DNI status --- Generalized weakness and ambulatory dysfunction--at baseline patient is bedbound requires a hoyer lift for transfer -Patient has around-the-clock caregivers at home according to patient's son Carrie Crawford -At baseline patient is usually able to feed herself ,at this time patient is very weak and not able to feed himself  Disposition/Need for in-Hospital Stay- patient unable to be discharged at this time due to acute on chronic hypoxic respiratory failure secondary to COVID-19 infection compounded by GNR UTI requiring IV antibiotics IV steroids and supportive care - Status is: Inpatient  Remains inpatient appropriate because:   Disposition: The patient is from: Home              Anticipated d/c is to: Home              Anticipated d/c date is: 2 days              Patient currently is not medically stable to d/c. Barriers: Not Clinically Stable-   Code Status :  -  Code Status: DNR   Family Communication:   (patient is alert, awake and coherent) Discussed with son Carrie Crawford  Consults  :  na  DVT Prophylaxis  :   - SCDs   enoxaparin (LOVENOX) injection 40 mg Start: 07/13/21 2030 SCDs Start: 07/13/21 2019    Lab Results  Component Value Date   PLT 273 07/15/2021    Inpatient Medications  Scheduled Meds:  acetaminophen  650 mg Oral Once   vitamin C  500 mg Oral  Daily   aspirin EC  81 mg Oral Daily   dextromethorphan-guaiFENesin  1 tablet Oral BID   donepezil  5 mg Oral QHS   enoxaparin (LOVENOX) injection  40 mg Subcutaneous Q24H   feeding supplement  237 mL Oral BID BM   ipratropium-albuterol  3 mL Nebulization TID   losartan  100 mg Oral QHS   nirmatrelvir/ritonavir EUA  2 tablet Oral BID   pantoprazole  40 mg Oral Daily   zinc sulfate  220 mg Oral Daily   Continuous Infusions:  azithromycin 500 mg (07/15/21 1845)   cefTRIAXone  (ROCEPHIN)  IV 2 g (07/15/21 1818)   PRN Meds:.acetaminophen, chlorpheniramine-HYDROcodone, guaiFENesin-dextromethorphan, ipratropium   Anti-infectives (From admission, onward)    Start     Dose/Rate Route Frequency Ordered Stop   07/13/21 2200  nirmatrelvir/ritonavir EUA (PAXLOVID) 2 tablet        2 tablet Oral 2 times daily 07/13/21 1946 07/18/21 2159   07/13/21 1745  cefTRIAXone (ROCEPHIN) 2 g in sodium chloride 0.9 % 100 mL IVPB        2 g 200 mL/hr over 30 Minutes Intravenous Every 24 hours 07/13/21 1738 07/18/21 1744   07/13/21 1745  azithromycin (ZITHROMAX) 500 mg in sodium chloride 0.9 % 250 mL IVPB        500 mg 250 mL/hr over 60 Minutes Intravenous Every 24 hours 07/13/21 1738 07/18/21 1744         Subjective: Carrie Crawford today has  no emesis,  No chest pain,    -Less sleepy, oral intake remains challenging No fevers  Objective: Vitals:   07/15/21 0412 07/15/21 0836 07/15/21 1352 07/15/21 1428  BP: (!) 161/68  (!) 137/54   Pulse: (!) 105  85   Resp: 20  18   Temp: 99.1 F (37.3 C)  97.9 F (36.6 C)   TempSrc: Oral     SpO2: 96% 95% 98% 97%  Weight:      Height:        Intake/Output Summary (Last 24 hours) at 07/15/2021 1959 Last data filed at 07/15/2021 1500 Gross per 24 hour  Intake 470 ml  Output 200 ml  Net 270 ml   Filed Weights   07/13/21 1712  Weight: 86 kg     Physical Exam  Gen:-Chronically ill-appearing, in no acute distress  HEENT:- [place].AT, No sclera icterus Nose-3L/min Neck-Supple Neck,No JVD,.  Lungs-diminished breath sounds, scattered wheezes bilaterally  CV- S1, S2 normal, regular  Abd-  +ve B.Sounds, Abd Soft, No tenderness,    Extremity/Skin:-  pedal pulses present  Psych-underlying memory and cognitive deficits at baseline neuro-generalized weakness, no new focal deficits, no tremors  Data Reviewed: I have personally reviewed following labs and imaging studies  CBC: Recent Labs  Lab 07/13/21 1803 07/14/21 0631  07/15/21 0551  WBC 19.5* 16.4* 14.3*  NEUTROABS 16.6* 13.4* 11.6*  HGB 10.6* 9.8* 9.6*  HCT 30.7* 29.3* 28.7*  MCV 95.0 94.8 97.0  PLT 273 256 160   Basic Metabolic Panel: Recent Labs  Lab 07/13/21 1803 07/14/21 0631 07/14/21 0941 07/15/21 0551  NA 122* 125* 127* 130*  K 3.7 3.3* 3.3* 2.8*  CL 85* 91* 91* 95*  CO2 27 27 28 25   GLUCOSE 149* 127* 138* 112*  BUN 15 14 13 12   CREATININE 0.78 0.68 0.71 0.58  CALCIUM 7.9* 7.4* 7.5* 7.6*  MG  --  1.7  --  1.9  PHOS  --  2.3*  --  2.0*   GFR: Estimated Creatinine  Clearance: 46.6 mL/min (by C-G formula based on SCr of 0.58 mg/dL). Liver Function Tests: Recent Labs  Lab 07/13/21 1803 07/14/21 0631 07/15/21 0551  AST 17 16 19   ALT 11 9 12   ALKPHOS 71 63 66  BILITOT 0.3 0.4 0.3  PROT 7.4 6.8 6.8  ALBUMIN 2.7* 2.4* 2.4*   No results for input(s): LIPASE, AMYLASE in the last 168 hours. No results for input(s): AMMONIA in the last 168 hours. Coagulation Profile: Recent Labs  Lab 07/13/21 1803  INR 1.3*   Cardiac Enzymes: No results for input(s): CKTOTAL, CKMB, CKMBINDEX, TROPONINI in the last 168 hours. BNP (last 3 results) No results for input(s): PROBNP in the last 8760 hours. HbA1C: No results for input(s): HGBA1C in the last 72 hours. CBG: No results for input(s): GLUCAP in the last 168 hours. Lipid Profile: No results for input(s): CHOL, HDL, LDLCALC, TRIG, CHOLHDL, LDLDIRECT in the last 72 hours. Thyroid Function Tests: No results for input(s): TSH, T4TOTAL, FREET4, T3FREE, THYROIDAB in the last 72 hours. Anemia Panel: Recent Labs    07/14/21 0631 07/15/21 0551  FERRITIN 222 279   Urine analysis:    Component Value Date/Time   COLORURINE YELLOW 07/13/2021 1850   APPEARANCEUR CLOUDY (A) 07/13/2021 1850   LABSPEC 1.011 07/13/2021 1850   PHURINE 8.0 07/13/2021 1850   GLUCOSEU NEGATIVE 07/13/2021 1850   GLUCOSEU NEGATIVE 04/07/2016 1525   HGBUR SMALL (A) 07/13/2021 1850   BILIRUBINUR NEGATIVE  07/13/2021 1850   BILIRUBINUR negative 04/07/2016 1408   KETONESUR NEGATIVE 07/13/2021 1850   PROTEINUR 30 (A) 07/13/2021 1850   UROBILINOGEN 0.2 04/07/2016 1525   UROBILINOGEN 0.2 04/07/2016 1408   NITRITE NEGATIVE 07/13/2021 1850   LEUKOCYTESUR LARGE (A) 07/13/2021 1850   Sepsis Labs: @LABRCNTIP (procalcitonin:4,lacticidven:4)  ) Recent Results (from the past 240 hour(s))  Resp Panel by RT-PCR (Flu A&B, Covid) Nasopharyngeal Swab     Status: Abnormal   Collection Time: 07/13/21  6:15 PM   Specimen: Nasopharyngeal Swab; Nasopharyngeal(NP) swabs in vial transport medium  Result Value Ref Range Status   SARS Coronavirus 2 by RT PCR POSITIVE (A) NEGATIVE Final    Comment: (NOTE) SARS-CoV-2 target nucleic acids are DETECTED.  The SARS-CoV-2 RNA is generally detectable in upper respiratory specimens during the acute phase of infection. Positive results are indicative of the presence of the identified virus, but do not rule out bacterial infection or co-infection with other pathogens not detected by the test. Clinical correlation with patient history and other diagnostic information is necessary to determine patient infection status. The expected result is Negative.  Fact Sheet for Patients: EntrepreneurPulse.com.au  Fact Sheet for Healthcare Providers: IncredibleEmployment.be  This test is not yet approved or cleared by the [place] FDA and  has been authorized for detection and/or diagnosis of SARS-CoV-2 by FDA under an Emergency Use Authorization (EUA).  This EUA will remain in effect (meaning this test can be used) for the duration of  the COVID-19 declaration under Section 564(b)(1) of the A ct, 21 U.S.C. section 360bbb-3(b)(1), unless the authorization is terminated or revoked sooner.     Influenza A by PCR NEGATIVE NEGATIVE Final   Influenza B by PCR NEGATIVE NEGATIVE Final    Comment: (NOTE) The Xpert Xpress SARS-CoV-2/FLU/RSV  plus assay is intended as an aid in the diagnosis of influenza from Nasopharyngeal swab specimens and should not be used as a sole basis for treatment. Nasal washings and aspirates are unacceptable for Xpert Xpress SARS-CoV-2/FLU/RSV testing.  Fact Sheet for Patients: EntrepreneurPulse.com.au  Fact Sheet for Healthcare Providers: IncredibleEmployment.be  This test is not yet approved or cleared by the [place] FDA and has been authorized for detection and/or diagnosis of SARS-CoV-2 by FDA under an Emergency Use Authorization (EUA). This EUA will remain in effect (meaning this test can be used) for the duration of the COVID-19 declaration under Section 564(b)(1) of the Act, 21 U.S.C. section 360bbb-3(b)(1), unless the authorization is terminated or revoked.  Performed at [facility], [address] [place], [place] [address]   Blood Culture (routine x 2)     Status: None (Preliminary result)   Collection Time: 07/13/21  6:20 PM   Specimen: BLOOD RIGHT HAND  Result Value Ref Range Status   Specimen Description   Final    BLOOD RIGHT HAND BOTTLES DRAWN AEROBIC AND ANAEROBIC   Special Requests Blood Culture adequate volume  Final   Culture   Final    NO GROWTH 2 DAYS Performed at [facility], [address], [place], [place] [address]    Report Status PENDING  Incomplete  Blood Culture (routine x 2)     Status: None (Preliminary result)   Collection Time: 07/13/21  6:20 PM   Specimen: BLOOD RIGHT FOREARM  Result Value Ref Range Status   Specimen Description   Final    BLOOD RIGHT FOREARM BOTTLES DRAWN AEROBIC AND ANAEROBIC   Special Requests   Final    Blood Culture results may not be optimal due to an inadequate volume of blood received in culture bottles   Culture   Final    NO GROWTH 2 DAYS Performed at [facility], [address], [place], [place] [address]    Report Status PENDING  Incomplete  Urine Culture     Status: Abnormal  (Preliminary result)   Collection Time: 07/13/21  6:50 PM   Specimen: In/Out Cath Urine  Result Value Ref Range Status   Specimen Description   Final    IN/OUT CATH URINE Performed at [facility], [address], [place], [place] [address]    Special Requests   Final    NONE Performed at [facility], [address], [place], [place] [address]    Culture (A)  Final    >=100,000 COLONIES/mL GRAM NEGATIVE RODS IDENTIFICATION AND SUSCEPTIBILITIES TO FOLLOW Performed at [facility], [place] [address] [place], [place] [address]    Report Status PENDING  Incomplete     Radiology Studies: No results found.  Scheduled Meds:  acetaminophen  650 mg Oral Once   vitamin C  500 mg Oral Daily   aspirin EC  81 mg Oral Daily   dextromethorphan-guaiFENesin  1 tablet Oral BID   donepezil  5 mg Oral QHS   enoxaparin (LOVENOX) injection  40 mg Subcutaneous Q24H   feeding supplement  237 mL Oral BID BM   ipratropium-albuterol  3 mL Nebulization TID   losartan  100 mg Oral QHS   nirmatrelvir/ritonavir EUA  2 tablet Oral BID   pantoprazole  40 mg Oral Daily   zinc sulfate  220 mg Oral Daily   Continuous Infusions:  azithromycin 500 mg (07/15/21 1845)   cefTRIAXone (ROCEPHIN)  IV 2 g (07/15/21 1818)     LOS: 2 days    Roxan Hockey M.D on 07/15/2021 at 7:59 PM  Go to [email or web address] - for contact info  [facility] - Office  [phone]  If 7PM-7AM, please contact night-coverage [email or web address] Password [facility] 07/15/2021, 7:59 PM

## 2021-07-16 DIAGNOSIS — N390 Urinary tract infection, site not specified: Secondary | ICD-10-CM

## 2021-07-16 LAB — COMPREHENSIVE METABOLIC PANEL
ALT: 15 U/L (ref 0–44)
AST: 18 U/L (ref 15–41)
Albumin: 2.3 g/dL — ABNORMAL LOW (ref 3.5–5.0)
Alkaline Phosphatase: 63 U/L (ref 38–126)
Anion gap: 6 (ref 5–15)
BUN: 12 mg/dL (ref 8–23)
CO2: 27 mmol/L (ref 22–32)
Calcium: 7.6 mg/dL — ABNORMAL LOW (ref 8.9–10.3)
Chloride: 100 mmol/L (ref 98–111)
Creatinine, Ser: 0.64 mg/dL (ref 0.44–1.00)
GFR, Estimated: >60 mL/min (ref >60)
Glucose, Bld: 117 mg/dL — ABNORMAL HIGH (ref 70–99)
Potassium: 3.2 mmol/L — ABNORMAL LOW (ref 3.5–5.1)
Sodium: 133 mmol/L — ABNORMAL LOW (ref 135–145)
Total Bilirubin: 0.3 mg/dL (ref 0.3–1.2)
Total Protein: 6.5 g/dL (ref 6.5–8.1)

## 2021-07-16 LAB — RENAL FUNCTION PANEL
Albumin: 2.3 g/dL — ABNORMAL LOW (ref 3.5–5.0)
Anion gap: 6 (ref 5–15)
BUN: 12 mg/dL (ref 8–23)
CO2: 28 mmol/L (ref 22–32)
Calcium: 7.5 mg/dL — ABNORMAL LOW (ref 8.9–10.3)
Chloride: 99 mmol/L (ref 98–111)
Creatinine, Ser: 0.64 mg/dL (ref 0.44–1.00)
GFR, Estimated: >60 mL/min (ref >60)
Glucose, Bld: 117 mg/dL — ABNORMAL HIGH (ref 70–99)
Phosphorus: 1.7 mg/dL — ABNORMAL LOW (ref 2.5–4.6)
Potassium: 3.1 mmol/L — ABNORMAL LOW (ref 3.5–5.1)
Sodium: 133 mmol/L — ABNORMAL LOW (ref 135–145)

## 2021-07-16 LAB — CBC WITH DIFFERENTIAL/PLATELET
Abs Immature Granulocytes: 0.15 10*3/uL — ABNORMAL HIGH (ref 0.00–0.07)
Basophils Absolute: 0 10*3/uL (ref 0.0–0.1)
Basophils Relative: 0 %
Eosinophils Absolute: 0 10*3/uL (ref 0.0–0.5)
Eosinophils Relative: 0 %
HCT: 28.4 % — ABNORMAL LOW (ref 36.0–46.0)
Hemoglobin: 9.5 g/dL — ABNORMAL LOW (ref 12.0–15.0)
Immature Granulocytes: 1 %
Lymphocytes Relative: 10 %
Lymphs Abs: 1.2 10*3/uL (ref 0.7–4.0)
MCH: 32.4 pg (ref 26.0–34.0)
MCHC: 33.5 g/dL (ref 30.0–36.0)
MCV: 96.9 fL (ref 80.0–100.0)
Monocytes Absolute: 1.2 10*3/uL — ABNORMAL HIGH (ref 0.1–1.0)
Monocytes Relative: 9 %
Neutro Abs: 9.8 10*3/uL — ABNORMAL HIGH (ref 1.7–7.7)
Neutrophils Relative %: 80 %
Platelets: 270 10*3/uL (ref 150–400)
RBC: 2.93 MIL/uL — ABNORMAL LOW (ref 3.87–5.11)
RDW: 13 % (ref 11.5–15.5)
WBC: 12.3 10*3/uL — ABNORMAL HIGH (ref 4.0–10.5)
nRBC: 0 % (ref 0.0–0.2)

## 2021-07-16 LAB — PHOSPHORUS: Phosphorus: 1.7 mg/dL — ABNORMAL LOW (ref 2.5–4.6)

## 2021-07-16 LAB — FERRITIN: Ferritin: 280 ng/mL (ref 11–307)

## 2021-07-16 LAB — MAGNESIUM: Magnesium: 2.5 mg/dL — ABNORMAL HIGH (ref 1.7–2.4)

## 2021-07-16 LAB — C-REACTIVE PROTEIN: CRP: 23.2 mg/dL — ABNORMAL HIGH (ref <1.0)

## 2021-07-16 LAB — D-DIMER, QUANTITATIVE: D-Dimer, Quant: 1.22 ug/mL-FEU — ABNORMAL HIGH (ref 0.00–0.50)

## 2021-07-16 MED ORDER — POTASSIUM CHLORIDE CRYS ER 20 MEQ PO TBCR
40.0000 meq | EXTENDED_RELEASE_TABLET | Freq: Once | ORAL | Status: AC
  Administered 2021-07-16: 40 meq via ORAL
  Filled 2021-07-16: qty 2

## 2021-07-16 NOTE — Progress Notes (Signed)
PROGRESS NOTE     Carrie Crawford, is a 88 y.o. female, DOB - 1926-06-28, PJK:[id number]  Admit date - 07/13/2021   Admitting Physician Bernadette Hoit, DO  Outpatient Primary MD for the patient is Carrie Perking, FNP  LOS - 3  Chief Complaint  Patient presents with   Shortness of Breath        Brief Narrative:  88 y.o. female with medical history significant for COPD on 2 LPM of oxygen at baseline, HFpEF, HTN, hyperlipidemia   Assessment & Plan:   Principal Problem:   Pneumonia due to COVID-19 virus Active Problems:   Essential hypertension   Diastolic heart failure (HCC)   Obesity (BMI 30-39.9)   Hyperglycemia   Hyponatremia   CAP (community acquired pneumonia)   Leukocytosis   Hypoalbuminemia due to protein-calorie malnutrition (HCC)   Chronic respiratory failure with hypoxia (HCC)   COVID-19 virus pneumonia---POA -Continue atrovent q.6h -Continue vitamin-C 500 mg p.o. Daily -Continue zinc 220 mg p.o. Daily -Continue Mucinex, Robitussin and Tussionex -Continue Tylenol p.r.n. for fever -follow inflammatory markers. -Patient uses supplemental oxygen at 2 LPM at baseline,  -Strep pneumo antigen is negative -Legionella antigen Neg   -Ecoli UTI- -continue IV Rocephin  -sensitivity pending   Acute on Chronic hyponatremia Na 122 >> 127>130  -Lasix may be contributing factor -Lasix remains on hold -Avoid dehydration  -encourage increased oral intake  Hypoalbuminemia possibly secondary to moderate protein calorie malnutrition -Albumin 2.7, protein supplement to be provided -increased oral intake encouraged.  COPD with acute on chronic hypoxic respiratory failure secondary to COVID-19 infection -At baseline patient uses 2 L of nasal cannula intermittently -Continue Atrovent, incentive spirometry and flutter valve -Continue supplemental oxygen as needed -continue the use of paxlovid.   Essential hypertension -Continue losartan  Hypokalemia -replete and  follow trend  HFpEF -LVEF done in the 2019 showed LVEF of 60 to 80% with LV diastolic parameters showing G1 DD -Patient does not appear fluid overloaded -Continue aspirin, losartan;  -Continue following daily weights and strict I's and O's. -Continue holding diuretics.  Dementia -Continue Aricept -Continue constant reorientation and supportive care.  Social/Ethics-- Discussed with son Mr Carrie Crawford , he confirms DNR/DNI status --- Generalized weakness and ambulatory dysfunction--at baseline patient is essentially bedbound requires a hoyer lift for transfer -Patient has around-the-clock caregivers at home according to patient's son Carrie Crawford -At baseline patient is usually able to feed herself ,at this time patient is very weak and not able to feed herself  Disposition/Need for in-Hospital Stay- patient unable to be discharged at this time due to acute on chronic hypoxic respiratory failure secondary to COVID-19 infection compounded by E. coli UTI requiring IV antibiotics IV steroids and supportive care - Status is: Inpatient  Remains inpatient appropriate because:   Disposition: The patient is from: Home              Anticipated d/c is to: Home              Anticipated d/c date is: 2 days              Patient currently is not medically stable to d/c. Barriers: Not Clinically Stable-   Code Status :  -  Code Status: DNR   Family Communication:   (patient is alert, awake and coherent) Discussed with son Carrie Crawford  Consults  :  na  DVT Prophylaxis  :   - SCDs   enoxaparin (LOVENOX) injection 40 mg Start: 07/13/21 2030 SCDs Start: 07/13/21 2019  Lab Results  Component Value Date   PLT 270 07/16/2021    Inpatient Medications  Scheduled Meds:  acetaminophen  650 mg Oral Once   vitamin C  500 mg Oral Daily   aspirin EC  81 mg Oral Daily   dextromethorphan-guaiFENesin  1 tablet Oral BID   donepezil  5 mg Oral QHS   enoxaparin (LOVENOX) injection  40 mg Subcutaneous Q24H    feeding supplement  237 mL Oral BID BM   ipratropium-albuterol  3 mL Nebulization TID   losartan  100 mg Oral QHS   nirmatrelvir/ritonavir EUA  2 tablet Oral BID   pantoprazole  40 mg Oral Daily   zinc sulfate  220 mg Oral Daily   Continuous Infusions:  azithromycin 500 mg (07/15/21 1845)   cefTRIAXone (ROCEPHIN)  IV 2 g (07/15/21 1818)   PRN Meds:.acetaminophen, chlorpheniramine-HYDROcodone, guaiFENesin-dextromethorphan, ipratropium   Anti-infectives (From admission, onward)    Start     Dose/Rate Route Frequency Ordered Stop   07/13/21 2200  nirmatrelvir/ritonavir EUA (PAXLOVID) 2 tablet        2 tablet Oral 2 times daily 07/13/21 1946 07/18/21 2159   07/13/21 1745  cefTRIAXone (ROCEPHIN) 2 g in sodium chloride 0.9 % 100 mL IVPB        2 g 200 mL/hr over 30 Minutes Intravenous Every 24 hours 07/13/21 1738 07/18/21 1744   07/13/21 1745  azithromycin (ZITHROMAX) 500 mg in sodium chloride 0.9 % 250 mL IVPB        500 mg 250 mL/hr over 60 Minutes Intravenous Every 24 hours 07/13/21 1738 07/18/21 1744         Subjective: Katy Apo Mungia no fever, no chest pain, no nausea, no vomiting.  Good saturation on room air.  No requiring oxygen supplementation.  Reports feeling weak and tired.  Objective: Vitals:   07/16/21 0831 07/16/21 1300 07/16/21 1438 07/16/21 1500  BP:  (!) 185/79  (!) 160/85  Pulse:  (!) 107  93  Resp:  (!) 23    Temp:  98.2 F (36.8 C)    TempSrc:  Oral    SpO2: 98% (!) 89% 92%   Weight:      Height:        Intake/Output Summary (Last 24 hours) at 07/16/2021 1620 Last data filed at 07/16/2021 1300 Gross per 24 hour  Intake 600 ml  Output --  Net 600 ml   Filed Weights   07/13/21 1712  Weight: 86 kg     Physical Exam General exam: Chronically ill in appearance; denies chest pain, no nausea, no vomiting.  Patient somnolent and weak during evaluation.  No requiring oxygen supplementation. Respiratory system: Positive rhonchi bilaterally; no using  accessory muscle.  No wheezing or crackles on exam. Cardiovascular system:RRR.  No rubs, no gallops, no JVD. Gastrointestinal system: Abdomen is nondistended, soft and nontender. No organomegaly or masses felt. Normal bowel sounds heard. Central nervous system: Generally weak; no focal deficits. Extremities: No cyanosis or clubbing. Skin: No rashes, no petechiae. Psychiatry: Mood and affect appropriate.   Data Reviewed: I have personally reviewed following labs and imaging studies  CBC: Recent Labs  Lab 07/13/21 1803 07/14/21 0631 07/15/21 0551 07/16/21 0637  WBC 19.5* 16.4* 14.3* 12.3*  NEUTROABS 16.6* 13.4* 11.6* 9.8*  HGB 10.6* 9.8* 9.6* 9.5*  HCT 30.7* 29.3* 28.7* 28.4*  MCV 95.0 94.8 97.0 96.9  PLT 273 256 273 161   Basic Metabolic Panel: Recent Labs  Lab 07/13/21 1803 07/14/21 0631 07/14/21 0941 07/15/21  6333 07/16/21 0637  NA 122* 125* 127* 130* 133*   133*  K 3.7 3.3* 3.3* 2.8* 3.2*   3.1*  CL 85* 91* 91* 95* 100   99  CO2 27 27 28 25 27   28   GLUCOSE 149* 127* 138* 112* 117*   117*  BUN 15 14 13 12 12   12   CREATININE 0.78 0.68 0.71 0.58 0.64   0.64  CALCIUM 7.9* 7.4* 7.5* 7.6* 7.6*   7.5*  MG  --  1.7  --  1.9 2.5*  PHOS  --  2.3*  --  2.0* 1.7*   1.7*   GFR: Estimated Creatinine Clearance: 46.6 mL/min (by C-G formula based on SCr of 0.64 mg/dL).  Liver Function Tests: Recent Labs  Lab 07/13/21 1803 07/14/21 0631 07/15/21 0551 07/16/21 0637  AST 17 16 19 18   ALT 11 9 12 15   ALKPHOS 71 63 66 63  BILITOT 0.3 0.4 0.3 0.3  PROT 7.4 6.8 6.8 6.5  ALBUMIN 2.7* 2.4* 2.4* 2.3*   2.3*   Coagulation Profile: Recent Labs  Lab 07/13/21 1803  INR 1.3*   Anemia Panel: Recent Labs    07/15/21 0551 07/16/21 0637  FERRITIN 279 280   Urine analysis:    Component Value Date/Time   COLORURINE YELLOW 07/13/2021 1850   APPEARANCEUR CLOUDY (A) 07/13/2021 1850   LABSPEC 1.011 07/13/2021 1850   PHURINE 8.0 07/13/2021 1850   GLUCOSEU NEGATIVE 07/13/2021  1850   GLUCOSEU NEGATIVE 04/07/2016 1525   HGBUR SMALL (A) 07/13/2021 1850   BILIRUBINUR NEGATIVE 07/13/2021 1850   BILIRUBINUR negative 04/07/2016 1408   KETONESUR NEGATIVE 07/13/2021 1850   PROTEINUR 30 (A) 07/13/2021 1850   UROBILINOGEN 0.2 04/07/2016 1525   UROBILINOGEN 0.2 04/07/2016 1408   NITRITE NEGATIVE 07/13/2021 1850   LEUKOCYTESUR LARGE (A) 07/13/2021 1850   Sepsis Labs:  Recent Results (from the past 240 hour(s))  Resp Panel by RT-PCR (Flu A&B, Covid) Nasopharyngeal Swab     Status: Abnormal   Collection Time: 07/13/21  6:15 PM   Specimen: Nasopharyngeal Swab; Nasopharyngeal(NP) swabs in vial transport medium  Result Value Ref Range Status   SARS Coronavirus 2 by RT PCR POSITIVE (A) NEGATIVE Final    Comment: (NOTE) SARS-CoV-2 target nucleic acids are DETECTED.  The SARS-CoV-2 RNA is generally detectable in upper respiratory specimens during the acute phase of infection. Positive results are indicative of the presence of the identified virus, but do not rule out bacterial infection or co-infection with other pathogens not detected by the test. Clinical correlation with patient history and other diagnostic information is necessary to determine patient infection status. The expected result is Negative.  Fact Sheet for Patients: EntrepreneurPulse.com.au  Fact Sheet for Healthcare Providers: IncredibleEmployment.be  This test is not yet approved or cleared by the [place] FDA and  has been authorized for detection and/or diagnosis of SARS-CoV-2 by FDA under an Emergency Use Authorization (EUA).  This EUA will remain in effect (meaning this test can be used) for the duration of  the COVID-19 declaration under Section 564(b)(1) of the A ct, 21 U.S.C. section 360bbb-3(b)(1), unless the authorization is terminated or revoked sooner.     Influenza A by PCR NEGATIVE NEGATIVE Final   Influenza B by PCR NEGATIVE NEGATIVE Final     Comment: (NOTE) The Xpert Xpress SARS-CoV-2/FLU/RSV plus assay is intended as an aid in the diagnosis of influenza from Nasopharyngeal swab specimens and should not be used as a sole basis for treatment.  Nasal washings and aspirates are unacceptable for Xpert Xpress SARS-CoV-2/FLU/RSV testing.  Fact Sheet for Patients: EntrepreneurPulse.com.au  Fact Sheet for Healthcare Providers: IncredibleEmployment.be  This test is not yet approved or cleared by the [place] FDA and has been authorized for detection and/or diagnosis of SARS-CoV-2 by FDA under an Emergency Use Authorization (EUA). This EUA will remain in effect (meaning this test can be used) for the duration of the COVID-19 declaration under Section 564(b)(1) of the Act, 21 U.S.C. section 360bbb-3(b)(1), unless the authorization is terminated or revoked.  Performed at [facility], [address] [place], [place] [address]   Blood Culture (routine x 2)     Status: None (Preliminary result)   Collection Time: 07/13/21  6:20 PM   Specimen: BLOOD RIGHT HAND  Result Value Ref Range Status   Specimen Description   Final    BLOOD RIGHT HAND BOTTLES DRAWN AEROBIC AND ANAEROBIC   Special Requests Blood Culture adequate volume  Final   Culture   Final    NO GROWTH 3 DAYS Performed at [facility], [address] [place], [place] [address]    Report Status PENDING  Incomplete  Blood Culture (routine x 2)     Status: None (Preliminary result)   Collection Time: 07/13/21  6:20 PM   Specimen: BLOOD RIGHT FOREARM  Result Value Ref Range Status   Specimen Description   Final    BLOOD RIGHT FOREARM BOTTLES DRAWN AEROBIC AND ANAEROBIC   Special Requests   Final    Blood Culture results may not be optimal due to an inadequate volume of blood received in culture bottles   Culture   Final    NO GROWTH 3 DAYS Performed at [facility], [address] [place], [place] [address]    Report Status  PENDING  Incomplete  Urine Culture     Status: Abnormal (Preliminary result)   Collection Time: 07/13/21  6:50 PM   Specimen: In/Out Cath Urine  Result Value Ref Range Status   Specimen Description   Final    IN/OUT CATH URINE Performed at [facility], [address], [place], [place] [address]    Special Requests   Final    NONE Performed at [facility], [address], [place], [place] [address]    Culture (A)  Final    >=100,000 COLONIES/mL ESCHERICHIA COLI 10,000 COLONIES/mL PROTEUS MIRABILIS CULTURE REINCUBATED FOR BETTER GROWTH Performed at [facility], [place] [address] [place], [place] [address]    Report Status PENDING  Incomplete     Radiology Studies: No results found.  Scheduled Meds:  acetaminophen  650 mg Oral Once   vitamin C  500 mg Oral Daily   aspirin EC  81 mg Oral Daily   dextromethorphan-guaiFENesin  1 tablet Oral BID   donepezil  5 mg Oral QHS   enoxaparin (LOVENOX) injection  40 mg Subcutaneous Q24H   feeding supplement  237 mL Oral BID BM   ipratropium-albuterol  3 mL Nebulization TID   losartan  100 mg Oral QHS   nirmatrelvir/ritonavir EUA  2 tablet Oral BID   pantoprazole  40 mg Oral Daily   zinc sulfate  220 mg Oral Daily   Continuous Infusions:  azithromycin 500 mg (07/15/21 1845)   cefTRIAXone (ROCEPHIN)  IV 2 g (07/15/21 1818)     LOS: 3 days    Barton Dubois M.D on 07/16/2021 at 4:20 PM  Go to [email or web address] - for contact info  [facility] - Office  [phone]  If 7PM-7AM, please contact night-coverage [email or web address] Password [email or web address] 07/16/2021, 4:20 PM

## 2021-07-16 NOTE — TOC Progression Note (Signed)
Transition of Care [facility]) - Progression Note    Patient Details  Name: Carrie Crawford MRN: [id number] Date of Birth: 1926-01-28  Transition of Care [facility]) CM/SW Contact  Salome Arnt, [place] Phone Number: 07/16/2021, 11:20 AM  Clinical Narrative:   Transition of Care [facility]) Screening Note   Patient Details  Name: Carrie Crawford Date of Birth: July 08, 1926   Transition of Care [facility]) CM/SW Contact:    Salome Arnt, LCSW Phone Number: 07/16/2021, 11:20 AM    Transition of Care Department [facility]) has reviewed patient and no TOC needs have been identified at this time. We will continue to monitor patient advancement through interdisciplinary progression rounds. If new patient transition needs arise, please place a TOC consult.            Expected Discharge Plan and Services                                                 Social Determinants of Health (SDOH) Interventions    Readmission Risk Interventions No flowsheet data found.

## 2021-07-17 DIAGNOSIS — B9620 Unspecified Escherichia coli [E. coli] as the cause of diseases classified elsewhere: Secondary | ICD-10-CM

## 2021-07-17 LAB — CBC WITH DIFFERENTIAL/PLATELET
Abs Immature Granulocytes: 0.38 10*3/uL — ABNORMAL HIGH (ref 0.00–0.07)
Basophils Absolute: 0.1 10*3/uL (ref 0.0–0.1)
Basophils Relative: 1 %
Eosinophils Absolute: 0 10*3/uL (ref 0.0–0.5)
Eosinophils Relative: 0 %
HCT: 28.5 % — ABNORMAL LOW (ref 36.0–46.0)
Hemoglobin: 9.2 g/dL — ABNORMAL LOW (ref 12.0–15.0)
Immature Granulocytes: 4 %
Lymphocytes Relative: 12 %
Lymphs Abs: 1.3 10*3/uL (ref 0.7–4.0)
MCH: 31.4 pg (ref 26.0–34.0)
MCHC: 32.3 g/dL (ref 30.0–36.0)
MCV: 97.3 fL (ref 80.0–100.0)
Monocytes Absolute: 0.9 10*3/uL (ref 0.1–1.0)
Monocytes Relative: 8 %
Neutro Abs: 8 10*3/uL — ABNORMAL HIGH (ref 1.7–7.7)
Neutrophils Relative %: 75 %
Platelets: 275 10*3/uL (ref 150–400)
RBC: 2.93 MIL/uL — ABNORMAL LOW (ref 3.87–5.11)
RDW: 13.1 % (ref 11.5–15.5)
WBC: 10.5 10*3/uL (ref 4.0–10.5)
nRBC: 0 % (ref 0.0–0.2)

## 2021-07-17 LAB — COMPREHENSIVE METABOLIC PANEL
ALT: 13 U/L (ref 0–44)
AST: 15 U/L (ref 15–41)
Albumin: 2.2 g/dL — ABNORMAL LOW (ref 3.5–5.0)
Alkaline Phosphatase: 60 U/L (ref 38–126)
Anion gap: 6 (ref 5–15)
BUN: 13 mg/dL (ref 8–23)
CO2: 28 mmol/L (ref 22–32)
Calcium: 7.5 mg/dL — ABNORMAL LOW (ref 8.9–10.3)
Chloride: 100 mmol/L (ref 98–111)
Creatinine, Ser: 0.68 mg/dL (ref 0.44–1.00)
GFR, Estimated: >60 mL/min (ref >60)
Glucose, Bld: 98 mg/dL (ref 70–99)
Potassium: 3.1 mmol/L — ABNORMAL LOW (ref 3.5–5.1)
Sodium: 134 mmol/L — ABNORMAL LOW (ref 135–145)
Total Bilirubin: 0.6 mg/dL (ref 0.3–1.2)
Total Protein: 6.4 g/dL — ABNORMAL LOW (ref 6.5–8.1)

## 2021-07-17 LAB — D-DIMER, QUANTITATIVE
D-Dimer, Quant: 1.57 ug/mL-FEU — ABNORMAL HIGH (ref 0.00–0.50)
D-Dimer, Quant: 1.51 ug/mL-FEU — ABNORMAL HIGH (ref 0.00–0.50)

## 2021-07-17 LAB — FERRITIN: Ferritin: 299 ng/mL (ref 11–307)

## 2021-07-17 LAB — C-REACTIVE PROTEIN: CRP: 19.2 mg/dL — ABNORMAL HIGH (ref <1.0)

## 2021-07-17 LAB — MAGNESIUM: Magnesium: 2.3 mg/dL (ref 1.7–2.4)

## 2021-07-17 LAB — PHOSPHORUS: Phosphorus: 1.7 mg/dL — ABNORMAL LOW (ref 2.5–4.6)

## 2021-07-17 MED ORDER — SODIUM CHLORIDE 0.9 % IV SOLN: INTRAVENOUS | Status: DC

## 2021-07-17 MED ORDER — POTASSIUM CHLORIDE IN NACL 40-0.9 MEQ/L-% IV SOLN: INTRAVENOUS | Status: DC

## 2021-07-17 MED ORDER — SODIUM CHLORIDE 0.9 % IV SOLN
500.0000 mg | INTRAVENOUS | Status: AC
  Administered 2021-07-17: 500 mg via INTRAVENOUS
  Filled 2021-07-17: qty 5

## 2021-07-17 NOTE — Evaluation (Signed)
Physical Therapy Evaluation Patient Details Name: Carrie Crawford MRN: [id number] DOB: 09-04-1925 Today's Date: 07/17/2021  History of Present Illness  Carrie Crawford is a 88 y.o. female with medical history significant for COPD on 2 LPM of oxygen at baseline, HFpEF, essential hypertension, hyperlipidemia who presents to the emergency department via EMS due to shortness of breath.  Patient was unable to provide history possibly due to the dementia, call was made to Domingo Sep (son) but the phone was not answered, history was obtained from ED physician and ED medical record.  Per report, patient presents with 3-day onset of fever, cough, chest congestion and generalized weakness, she was reported to have had a sick contact, patient was hard of hearing.   Clinical Impression  Patient demonstrates slow labored movement for sitting up at bedside requiring use of bed rail, HOB raised and Max assist, required repeated attempts for sit to stands, but only able to lift bottom off bed slightly due to BLE sliding forward and patient having poor carryover for keeping feet behind knees, especially left due to stiff knee.  Patient put back to bed with Max assist to reposition.  Patient will benefit from continued skilled physical therapy in hospital and recommended venue below to increase strength, balance, endurance for safe ADLs and gait.         Recommendations for follow up therapy are one component of a multi-disciplinary discharge planning process, led by the attending physician.  Recommendations may be updated based on patient status, additional functional criteria and insurance authorization.  Follow Up Recommendations Skilled nursing-short term rehab (<3 hours/day)    Assistance Recommended at Discharge Intermittent Supervision/Assistance  Patient can return home with the following  A lot of help with walking and/or transfers;A lot of help with bathing/dressing/bathroom;Two people to help with  walking and/or transfers    Equipment Recommendations None recommended by PT  Recommendations for Other Services       Functional Status Assessment Patient has had a recent decline in their functional status and demonstrates the ability to make significant improvements in function in a reasonable and predictable amount of time.     Precautions / Restrictions Precautions Precautions: Fall Restrictions Weight Bearing Restrictions: No      Mobility  Bed Mobility Overal bed mobility: Needs Assistance Bed Mobility: Supine to Sit;Sit to Supine     Supine to sit: Max assist Sit to supine: Max assist   General bed mobility comments: slow labored movement requiring Max verbal/tactile cueing    Transfers                        Ambulation/Gait                  Stairs            Wheelchair Mobility    Modified Rankin (Stroke Patients Only)       Balance Overall balance assessment: Needs assistance Sitting-balance support: Feet supported;No upper extremity supported Sitting balance-Leahy Scale: Fair Sitting balance - Comments: seated at EOB                                     Pertinent Vitals/Pain Pain Assessment: No/denies pain    Home Living Family/patient expects to be discharged to:: Private residence Living Arrangements: Children Available Help at Discharge: Family;Available 24 hours/day Type of Home: House Home Access: Ramped entrance  Home Layout: One level Home Equipment: Conservation officer, nature (2 wheels);Wheelchair - manual      Prior Function Prior Level of Function : Needs assist             Mobility Comments: Patient is poor historian, information taken from previous admission ADLs Comments: assisted by family     Hand Dominance   Dominant Hand: Right    Extremity/Trunk Assessment   Upper Extremity Assessment Upper Extremity Assessment: Generalized weakness    Lower Extremity Assessment Lower  Extremity Assessment: Generalized weakness    Cervical / Trunk Assessment Cervical / Trunk Assessment: Normal  Communication   Communication: HOH  Cognition Arousal/Alertness: Awake/alert Behavior During Therapy: WFL for tasks assessed/performed Overall Cognitive Status: History of cognitive impairments - at baseline                                 General Comments: requires repeated verbal/tactile cueing mostly due to very [facility]        General Comments      Exercises     Assessment/Plan    PT Assessment Patient needs continued PT services  PT Problem List Decreased strength;Decreased activity tolerance;Decreased balance;Decreased mobility       PT Treatment Interventions DME instruction;Gait training;Stair training;Functional mobility training;Therapeutic activities;Therapeutic exercise;Patient/family education;Balance training    PT Goals (Current goals can be found in the Care Plan section)  Acute Rehab PT Goals Patient Stated Goal: return home PT Goal Formulation: With patient Time For Goal Achievement: 07/31/21 Potential to Achieve Goals: Fair    Frequency Min 3X/week     Co-evaluation               AM-PAC PT "6 Clicks" Mobility  Outcome Measure Help needed turning from your back to your side while in a flat bed without using bedrails?: A Lot Help needed moving from lying on your back to sitting on the side of a flat bed without using bedrails?: A Lot Help needed moving to and from a bed to a chair (including a wheelchair)?: Total Help needed standing up from a chair using your arms (e.g., wheelchair or bedside chair)?: Total Help needed to walk in hospital room?: Total Help needed climbing 3-5 steps with a railing? : Total 6 Click Score: 8    End of Session Equipment Utilized During Treatment: Oxygen Activity Tolerance: Patient tolerated treatment well;Patient limited by fatigue Patient left: in bed;with call bell/phone within  reach;with bed alarm set Nurse Communication: Mobility status PT Visit Diagnosis: Unsteadiness on feet (R26.81);Other abnormalities of gait and mobility (R26.89);Muscle weakness (generalized) (M62.81)    Time: 9163-8466 PT Time Calculation (min) (ACUTE ONLY): 35 min   Charges:   PT Evaluation $PT Eval Moderate Complexity: 1 Mod PT Treatments $Therapeutic Activity: 23-37 mins        2:31 PM, 07/17/21 Lonell Grandchild, MPT Physical Therapist with [facility] 336 [phone] office [phone] mobile phone

## 2021-07-17 NOTE — Progress Notes (Signed)
PROGRESS NOTE     Carrie Crawford, is a 88 y.o. female, DOB - 06-Jun-1926, MOQ:[id number]  Admit date - 07/13/2021   Admitting Physician Bernadette Hoit, DO  Outpatient Primary MD for the patient is [name], FNP  LOS - 4  Chief Complaint  Patient presents with   Shortness of Breath        Brief Narrative:  88 y.o. female with medical history significant for COPD on 2 LPM of oxygen at baseline, HFpEF, HTN, hyperlipidemia   Assessment & Plan:   Principal Problem:   Pneumonia due to COVID-19 virus Active Problems:   Essential hypertension   Diastolic heart failure ([place])   Obesity (BMI 30-39.9)   Hyperglycemia   Hyponatremia   CAP (community acquired pneumonia)   Leukocytosis   Hypoalbuminemia due to protein-calorie malnutrition (HCC)   Chronic respiratory failure with hypoxia (HCC)   COVID-19 virus pneumonia---POA -Continue atrovent q.6h -Continue vitamin-C 500 mg p.o. Daily -Continue zinc 220 mg p.o. Daily -Continue Mucinex, Robitussin and Tussionex -Continue Tylenol p.r.n. for fever -continue to follow inflammatory markers. -Patient uses supplemental oxygen at 2 LPM at baseline, requiring 3L currently.  -Strep pneumo antigen is negative -Legionella antigen Neg -continue tx with Molnupiravir (2 more days pending)   -Ecoli UTI- -continue IV Rocephin  -sensitivity pending. -patient is afebrile.   Acute on Chronic hyponatremia Na 122 >> 127>130 -Lasix may was contributing factor -Lasix remains on hold -Avoid dehydration  -encourage increased oral intake; currently assisted.  Hypoalbuminemia possibly secondary to moderate protein calorie malnutrition -Albumin 2.7, protein supplement to be provided -increased oral intake encouraged.  COPD with acute on chronic hypoxic respiratory failure secondary to COVID-19 infection -At baseline patient uses 2 L of nasal cannula intermittently -Continue Atrovent, incentive spirometry and flutter valve -Continue  supplemental oxygen as needed -continue the use of paxlovid.   Essential hypertension -Continue losartan  Hypokalemia -Continue repletion and follow trend.  HFpEF -LVEF done in the 2019 showed LVEF of 60 to 35% with LV diastolic parameters showing G1 DD -Patient does not appear fluid overloaded; in fact appears to be slightly dry currently. -Continue aspirin and losartan  -Continue following daily weights and strict I's and O's. -Continue holding diuretics; will give patient IVF's overnight..  Dementia -Continue Aricept -Continue constant reorientation and supportive care.  Social/Ethics-- Discussed with son [name] , he confirms DNR/DNI status --- Generalized weakness and ambulatory dysfunction--at baseline patient is essentially bedbound requires a hoyer lift for transfer -Patient has around-the-clock caregivers at home according to patient's son [name] -At baseline patient is usually able to feed herself ,at this time patient is very weak and requiring assistance to be able to eat.  Disposition/Need for in-Hospital Stay- patient unable to be discharged at this time due to acute on chronic hypoxic respiratory failure secondary to COVID-19 infection compounded by E. coli UTI requiring IV antibiotics IV steroids and supportive care. - Status is: Inpatient  Remains inpatient appropriate because:   Disposition: The patient is from: Home              Anticipated d/c is to: Home              Anticipated d/c date is: 1 days              Patient currently is not medically stable to d/c.  Barriers: Not Clinically Stable-   Code Status :  -  Code Status: DNR   Family Communication:   (patient is alert, awake and  coherent) unable to reach patient's son over the phone.  Consults  :  na  DVT Prophylaxis  :   - SCDs   enoxaparin (LOVENOX) injection 40 mg Start: 07/13/21 2030 SCDs Start: 07/13/21 2019    Lab Results  Component Value Date   PLT 275 07/17/2021     Inpatient Medications  Scheduled Meds:  acetaminophen  650 mg Oral Once   vitamin C  500 mg Oral Daily   aspirin EC  81 mg Oral Daily   dextromethorphan-guaiFENesin  1 tablet Oral BID   donepezil  5 mg Oral QHS   enoxaparin (LOVENOX) injection  40 mg Subcutaneous Q24H   feeding supplement  237 mL Oral BID BM   ipratropium-albuterol  3 mL Nebulization TID   losartan  100 mg Oral QHS   nirmatrelvir/ritonavir EUA  2 tablet Oral BID   pantoprazole  40 mg Oral Daily   zinc sulfate  220 mg Oral Daily   Continuous Infusions:  azithromycin Stopped (07/16/21 2055)   cefTRIAXone (ROCEPHIN)  IV Stopped (07/16/21 1815)   PRN Meds:.acetaminophen, chlorpheniramine-HYDROcodone, guaiFENesin-dextromethorphan, ipratropium   Anti-infectives (From admission, onward)    Start     Dose/Rate Route Frequency Ordered Stop   07/13/21 2200  nirmatrelvir/ritonavir EUA (PAXLOVID) 2 tablet        2 tablet Oral 2 times daily 07/13/21 1946 07/18/21 2159   07/13/21 1745  cefTRIAXone (ROCEPHIN) 2 g in sodium chloride 0.9 % 100 mL IVPB        2 g 200 mL/hr over 30 Minutes Intravenous Every 24 hours 07/13/21 1738 07/18/21 1744   07/13/21 1745  azithromycin (ZITHROMAX) 500 mg in sodium chloride 0.9 % 250 mL IVPB        500 mg 250 mL/hr over 60 Minutes Intravenous Every 24 hours 07/13/21 1738 07/18/21 1744         Subjective: [name] no fever, no nausea, no vomiting.  Good saturation on 3 L.  Bilateral diffuse rhonchi appreciated.  Patient is more alert and interactive today.  Objective: Vitals:   07/17/21 0427 07/17/21 1050 07/17/21 1204 07/17/21 1509  BP: (!) 153/78  (!) 166/77   Pulse: 98  93   Resp: (!) 21  (!) 22   Temp: 97.8 F (36.6 C)     TempSrc:      SpO2: 98% 97% 99% 94%  Weight:      Height:        Intake/Output Summary (Last 24 hours) at 07/17/2021 1514 Last data filed at 07/17/2021 1300 Gross per 24 hour  Intake 1532.22 ml  Output 200 ml  Net 1332.22 ml   Filed Weights    07/13/21 1712  Weight: 86 kg     Physical Exam General exam: Very hard of hearing; chronically ill in appearance, reports no chest pain, no nausea, no vomiting.  Patient is more alert and interactive today; and ended having some oral food intake with assistance. Respiratory system: Good saturation on 3 L nasal cannula supplementation positive diffuse rhonchi bilaterally; no using accessory muscles. Cardiovascular system: Rate controlled, no rubs, no gallops, no JVD on exam. Gastrointestinal system: Abdomen is nondistended, soft and nontender. No organomegaly or masses felt. Normal bowel sounds heard. Central nervous system: Generally weak; no new focal deficits Extremities: No cyanosis or clubbing. Skin: No rashes, no petechiae. Psychiatry: Mood & affect appropriate.    Data Reviewed: I have personally reviewed following labs and imaging studies  CBC: Recent Labs  Lab 07/13/21 1803 07/14/21 0631 07/15/21  3335 07/16/21 0637 07/17/21 0733  WBC 19.5* 16.4* 14.3* 12.3* 10.5  NEUTROABS 16.6* 13.4* 11.6* 9.8* 8.0*  HGB 10.6* 9.8* 9.6* 9.5* 9.2*  HCT 30.7* 29.3* 28.7* 28.4* 28.5*  MCV 95.0 94.8 97.0 96.9 97.3  PLT 273 256 273 270 456   Basic Metabolic Panel: Recent Labs  Lab 07/14/21 0631 07/14/21 0941 07/15/21 0551 07/16/21 0637 07/17/21 0733  NA 125* 127* 130* 133*   133* 134*  K 3.3* 3.3* 2.8* 3.2*   3.1* 3.1*  CL 91* 91* 95* 100   99 100  CO2 27 28 25 27   28 28   GLUCOSE 127* 138* 112* 117*   117* 98  BUN 14 13 12 12   12 13   CREATININE 0.68 0.71 0.58 0.64   0.64 0.68  CALCIUM 7.4* 7.5* 7.6* 7.6*   7.5* 7.5*  MG 1.7  --  1.9 2.5* 2.3  PHOS 2.3*  --  2.0* 1.7*   1.7* 1.7*   GFR: Estimated Creatinine Clearance: 46.6 mL/min (by C-G formula based on SCr of 0.68 mg/dL).  Liver Function Tests: Recent Labs  Lab 07/13/21 1803 07/14/21 0631 07/15/21 0551 07/16/21 0637 07/17/21 0733  AST 17 16 19 18 15   ALT 11 9 12 15 13   ALKPHOS 71 63 66 63 60  BILITOT 0.3 0.4  0.3 0.3 0.6  PROT 7.4 6.8 6.8 6.5 6.4*  ALBUMIN 2.7* 2.4* 2.4* 2.3*   2.3* 2.2*   Coagulation Profile: Recent Labs  Lab 07/13/21 1803  INR 1.3*   Anemia Panel: Recent Labs    07/16/21 0637 07/17/21 0733  FERRITIN 280 299   Urine analysis:    Component Value Date/Time   COLORURINE YELLOW 07/13/2021 1850   APPEARANCEUR CLOUDY (A) 07/13/2021 1850   LABSPEC 1.011 07/13/2021 1850   PHURINE 8.0 07/13/2021 1850   GLUCOSEU NEGATIVE 07/13/2021 1850   GLUCOSEU NEGATIVE 04/07/2016 1525   HGBUR SMALL (A) 07/13/2021 1850   BILIRUBINUR NEGATIVE 07/13/2021 1850   BILIRUBINUR negative 04/07/2016 1408   KETONESUR NEGATIVE 07/13/2021 1850   PROTEINUR 30 (A) 07/13/2021 1850   UROBILINOGEN 0.2 04/07/2016 1525   UROBILINOGEN 0.2 04/07/2016 1408   NITRITE NEGATIVE 07/13/2021 1850   LEUKOCYTESUR LARGE (A) 07/13/2021 1850   Sepsis Labs:  Recent Results (from the past 240 hour(s))  Resp Panel by RT-PCR (Flu A&B, Covid) Nasopharyngeal Swab     Status: Abnormal   Collection Time: 07/13/21  6:15 PM   Specimen: Nasopharyngeal Swab; Nasopharyngeal(NP) swabs in vial transport medium  Result Value Ref Range Status   SARS Coronavirus 2 by RT PCR POSITIVE (A) NEGATIVE Final    Comment: (NOTE) SARS-CoV-2 target nucleic acids are DETECTED.  The SARS-CoV-2 RNA is generally detectable in upper respiratory specimens during the acute phase of infection. Positive results are indicative of the presence of the identified virus, but do not rule out bacterial infection or co-infection with other pathogens not detected by the test. Clinical correlation with patient history and other diagnostic information is necessary to determine patient infection status. The expected result is Negative.  Fact Sheet for Patients: EntrepreneurPulse.com.au  Fact Sheet for Healthcare Providers: IncredibleEmployment.be  This test is not yet approved or cleared by the [place] FDA  and  has been authorized for detection and/or diagnosis of SARS-CoV-2 by FDA under an Emergency Use Authorization (EUA).  This EUA will remain in effect (meaning this test can be used) for the duration of  the COVID-19 declaration under Section 564(b)(1) of the A ct, 21  U.S.C. section 360bbb-3(b)(1), unless the authorization is terminated or revoked sooner.     Influenza A by PCR NEGATIVE NEGATIVE Final   Influenza B by PCR NEGATIVE NEGATIVE Final    Comment: (NOTE) The Xpert Xpress SARS-CoV-2/FLU/RSV plus assay is intended as an aid in the diagnosis of influenza from Nasopharyngeal swab specimens and should not be used as a sole basis for treatment. Nasal washings and aspirates are unacceptable for Xpert Xpress SARS-CoV-2/FLU/RSV testing.  Fact Sheet for Patients: EntrepreneurPulse.com.au  Fact Sheet for Healthcare Providers: IncredibleEmployment.be  This test is not yet approved or cleared by the [place] FDA and has been authorized for detection and/or diagnosis of SARS-CoV-2 by FDA under an Emergency Use Authorization (EUA). This EUA will remain in effect (meaning this test can be used) for the duration of the COVID-19 declaration under Section 564(b)(1) of the Act, 21 U.S.C. section 360bbb-3(b)(1), unless the authorization is terminated or revoked.  Performed at [facility], [address], [place], [place] [address]   Blood Culture (routine x 2)     Status: None (Preliminary result)   Collection Time: 07/13/21  6:20 PM   Specimen: BLOOD RIGHT HAND  Result Value Ref Range Status   Specimen Description   Final    BLOOD RIGHT HAND BOTTLES DRAWN AEROBIC AND ANAEROBIC   Special Requests Blood Culture adequate volume  Final   Culture   Final    NO GROWTH 4 DAYS Performed at [facility], [address] [place], [place] [address]    Report Status PENDING  Incomplete  Blood Culture (routine x 2)     Status: None (Preliminary result)    Collection Time: 07/13/21  6:20 PM   Specimen: BLOOD RIGHT FOREARM  Result Value Ref Range Status   Specimen Description   Final    BLOOD RIGHT FOREARM BOTTLES DRAWN AEROBIC AND ANAEROBIC   Special Requests   Final    Blood Culture results may not be optimal due to an inadequate volume of blood received in culture bottles   Culture   Final    NO GROWTH 4 DAYS Performed at [facility], [address], [place], [place] [address]    Report Status PENDING  Incomplete  Urine Culture     Status: Abnormal (Preliminary result)   Collection Time: 07/13/21  6:50 PM   Specimen: In/Out Cath Urine  Result Value Ref Range Status   Specimen Description   Final    IN/OUT CATH URINE Performed at [facility], [address] [place], [place] [address]    Special Requests   Final    NONE Performed at [facility], [address], [place], [place] [address]    Culture (A)  Final    >=100,000 COLONIES/mL ESCHERICHIA COLI 10,000 COLONIES/mL PROTEUS MIRABILIS SUSCEPTIBILITIES TO FOLLOW Performed at [facility], [place] [address], [place], [place] [address]    Report Status PENDING  Incomplete     Radiology Studies: No results found.  Scheduled Meds:  acetaminophen  650 mg Oral Once   vitamin C  500 mg Oral Daily   aspirin EC  81 mg Oral Daily   dextromethorphan-guaiFENesin  1 tablet Oral BID   donepezil  5 mg Oral QHS   enoxaparin (LOVENOX) injection  40 mg Subcutaneous Q24H   feeding supplement  237 mL Oral BID BM   ipratropium-albuterol  3 mL Nebulization TID   losartan  100 mg Oral QHS   nirmatrelvir/ritonavir EUA  2 tablet Oral BID   pantoprazole  40 mg Oral Daily   zinc sulfate  220 mg Oral Daily   Continuous Infusions:  azithromycin Stopped (07/16/21 2055)   cefTRIAXone (ROCEPHIN)  IV Stopped (07/16/21 1815)     LOS: 4 days    Barton Dubois M.D on 07/17/2021 at 3:14 PM  Go to [email or web address] - for contact info  [facility] - Office  [phone]  If 7PM-7AM,  please contact night-coverage [email or web address] Password [facility] 07/17/2021, 3:14 PM

## 2021-07-17 NOTE — Plan of Care (Signed)
°  Problem: Acute Rehab PT Goals(only PT should resolve) Goal: Pt Will Go Supine/Side To Sit Outcome: Progressing Flowsheets (Taken 07/17/2021 1433) Pt will go Supine/Side to Sit: with moderate assist Goal: Patient Will Transfer Sit To/From Stand Outcome: Progressing Flowsheets (Taken 07/17/2021 1433) Patient will transfer sit to/from stand:  with moderate assist  with maximum assist Goal: Pt Will Transfer Bed To Chair/Chair To Bed Outcome: Progressing Flowsheets (Taken 07/17/2021 1433) Pt will Transfer Bed to Chair/Chair to Bed:  with mod assist  with max assist Goal: Pt Will Ambulate Outcome: Progressing Flowsheets (Taken 07/17/2021 1433) Pt will Ambulate:  10 feet  with moderate assist  with rolling walker   2:34 PM, 07/17/21 Lonell Grandchild, MPT Physical Therapist with [facility] [phone] office [phone] mobile phone

## 2021-07-18 DIAGNOSIS — J9601 Acute respiratory failure with hypoxia: Secondary | ICD-10-CM

## 2021-07-18 LAB — CBC WITH DIFFERENTIAL/PLATELET
Basophils Absolute: 0 10*3/uL (ref 0.0–0.1)
Basophils Relative: 0 %
Eosinophils Absolute: 0 10*3/uL (ref 0.0–0.5)
Eosinophils Relative: 0 %
HCT: 28.6 % — ABNORMAL LOW (ref 36.0–46.0)
Hemoglobin: 9.1 g/dL — ABNORMAL LOW (ref 12.0–15.0)
Lymphocytes Relative: 14 %
Lymphs Abs: 1.1 10*3/uL (ref 0.7–4.0)
MCH: 31.2 pg (ref 26.0–34.0)
MCHC: 31.8 g/dL (ref 30.0–36.0)
MCV: 97.9 fL (ref 80.0–100.0)
Metamyelocytes Relative: 3 %
Monocytes Absolute: 0.4 10*3/uL (ref 0.1–1.0)
Monocytes Relative: 5 %
Neutro Abs: 6.2 10*3/uL (ref 1.7–7.7)
Neutrophils Relative %: 78 %
Platelets: 273 10*3/uL (ref 150–400)
RBC: 2.92 MIL/uL — ABNORMAL LOW (ref 3.87–5.11)
RDW: 13.2 % (ref 11.5–15.5)
WBC: 8 10*3/uL (ref 4.0–10.5)
nRBC: 0 % (ref 0.0–0.2)

## 2021-07-18 LAB — MAGNESIUM: Magnesium: 2.2 mg/dL (ref 1.7–2.4)

## 2021-07-18 LAB — URINE CULTURE: Culture: >=100000 — AB

## 2021-07-18 LAB — FERRITIN: Ferritin: 283 ng/mL (ref 11–307)

## 2021-07-18 LAB — COMPREHENSIVE METABOLIC PANEL
ALT: 12 U/L (ref 0–44)
AST: 14 U/L — ABNORMAL LOW (ref 15–41)
Albumin: 2.2 g/dL — ABNORMAL LOW (ref 3.5–5.0)
Alkaline Phosphatase: 59 U/L (ref 38–126)
Anion gap: 6 (ref 5–15)
BUN: 12 mg/dL (ref 8–23)
CO2: 30 mmol/L (ref 22–32)
Calcium: 7.7 mg/dL — ABNORMAL LOW (ref 8.9–10.3)
Chloride: 102 mmol/L (ref 98–111)
Creatinine, Ser: 0.67 mg/dL (ref 0.44–1.00)
GFR, Estimated: >60 mL/min (ref >60)
Glucose, Bld: 103 mg/dL — ABNORMAL HIGH (ref 70–99)
Potassium: 3.6 mmol/L (ref 3.5–5.1)
Sodium: 138 mmol/L (ref 135–145)
Total Bilirubin: 0.2 mg/dL — ABNORMAL LOW (ref 0.3–1.2)
Total Protein: 6.5 g/dL (ref 6.5–8.1)

## 2021-07-18 LAB — CULTURE, BLOOD (ROUTINE X 2)
Culture: NO GROWTH
Culture: NO GROWTH
Special Requests: ADEQUATE

## 2021-07-18 LAB — C-REACTIVE PROTEIN: CRP: 14.1 mg/dL — ABNORMAL HIGH (ref <1.0)

## 2021-07-18 LAB — PHOSPHORUS: Phosphorus: 1.8 mg/dL — ABNORMAL LOW (ref 2.5–4.6)

## 2021-07-18 LAB — D-DIMER, QUANTITATIVE: D-Dimer, Quant: 1.86 ug/mL-FEU — ABNORMAL HIGH (ref 0.00–0.50)

## 2021-07-18 MED ORDER — POTASSIUM PHOSPHATES 15 MMOLE/5ML IV SOLN
30.0000 mmol | Freq: Once | INTRAVENOUS | Status: AC
  Administered 2021-07-18: 30 mmol via INTRAVENOUS
  Filled 2021-07-18: qty 10

## 2021-07-18 MED ORDER — ASCORBIC ACID 500 MG PO TABS: 500.0000 mg | ORAL_TABLET | Freq: Every day | ORAL | 1 refills | Status: DC

## 2021-07-18 MED ORDER — AMOXICILLIN-POT CLAVULANATE 875-125 MG PO TABS: 1.0000 | ORAL_TABLET | Freq: Two times a day (BID) | ORAL | 0 refills | Status: DC

## 2021-07-18 MED ORDER — DM-GUAIFENESIN ER 30-600 MG PO TB12: 1.0000 | ORAL_TABLET | Freq: Two times a day (BID) | ORAL | 0 refills | Status: DC

## 2021-07-18 MED ORDER — PANTOPRAZOLE SODIUM 40 MG PO TBEC: 40.0000 mg | DELAYED_RELEASE_TABLET | Freq: Every day | ORAL | 0 refills | Status: DC

## 2021-07-18 MED ORDER — AMOXICILLIN-POT CLAVULANATE 875-125 MG PO TABS: 1.0000 | ORAL_TABLET | Freq: Two times a day (BID) | ORAL | 0 refills | Status: AC

## 2021-07-18 MED ORDER — IPRATROPIUM BROMIDE HFA 17 MCG/ACT IN AERS: 2.0000 | INHALATION_SPRAY | Freq: Three times a day (TID) | RESPIRATORY_TRACT | 1 refills | Status: DC

## 2021-07-18 MED ORDER — FUROSEMIDE 40 MG PO TABS: 20.0000 mg | ORAL_TABLET | Freq: Every day | ORAL | Status: DC

## 2021-07-18 MED ORDER — ZINC SULFATE 220 (50 ZN) MG PO CAPS: 220.0000 mg | ORAL_CAPSULE | Freq: Every day | ORAL | 1 refills | Status: DC

## 2021-07-18 MED ORDER — IPRATROPIUM-ALBUTEROL 0.5-2.5 (3) MG/3ML IN SOLN
3.0000 mL | Freq: Four times a day (QID) | RESPIRATORY_TRACT | Status: DC
  Administered 2021-07-18 (×3): 3 mL via RESPIRATORY_TRACT
  Filled 2021-07-18 (×3): qty 3

## 2021-07-18 NOTE — Plan of Care (Signed)

## 2021-07-18 NOTE — Discharge Summary (Addendum)
Physician Discharge Summary  Carrie Crawford Angelo TAV:[id number] DOB: 1926-05-20 DOA: 07/13/2021  PCP: [name], FNP  Admit date: 07/13/2021 Discharge date: 07/18/2021  Time spent: 35 minutes  Recommendations for Outpatient Follow-up:  Repeat BMET to follow electrolytes and renal function. Repeat CXR in 6-8 weeks to assure resolution of infiltrates. Reassess BP and volume status with further adjustment to her diuretic management as required.   Discharge Diagnoses:  Principal Problem:   Pneumonia due to COVID-19 virus Active Problems:   Essential hypertension   Diastolic heart failure (HCC)   Obesity (BMI 30-39.9)   Hyperglycemia   Hyponatremia   CAP (community acquired pneumonia)   Leukocytosis   Hypoalbuminemia due to protein-calorie malnutrition ([place])   Chronic respiratory failure with hypoxia (HCC)   Acute respiratory failure with hypoxia (HCC)   Discharge Condition: Stable and improved.  Discharged home with instruction to follow-up with PCP in 10 days.  Code status: DNR  Diet recommendation: Heart healthy diet.  Filed Weights   07/13/21 1712  Weight: 86 kg    History of present illness:  As per H&P written by Dr. Josephine Cables on 07/13/2021 Carrie Crawford is a 88 y.o. female with medical history significant for COPD on 2 LPM of oxygen at baseline, HFpEF, essential hypertension, hyperlipidemia who presents to the emergency department via EMS due to shortness of breath.  Patient was unable to provide history possibly due to the dementia, call was made to Domingo Sep (son) but the phone was not answered, history was obtained from ED physician and ED medical record.  Per report, patient presents with 3-day onset of fever, cough, chest congestion and generalized weakness, she was reported to have had a sick contact, patient was hard of hearing.   ED Course:  In the emergency department, she was febrile with a temperature of 102F, tachypneic, tachycardic, BP 118/106.  Work-up in  the ED showed leukocytosis, normocytic anemia, hyponatremia, hyperglycemia, hypoalbuminemia, urinalysis was positive for large leukocytes.  Influenza A, B was negative.  SARS coronavirus 2 was positive. Chest x-ray showed Cardiomegaly with central pulmonary vascular congestion/mild edema. Right lower lobe airspace disease, possibly pneumonia or aspiration. Small left pleural effusion. Patient was treated with IV ceftriaxone and azithromycin, IV hydration was provided and Paxlovid was started.  Hospital Course:  Sepsis secondary to COVID-19 virus pneumonia and UTI (see below for details of urine infection). -Patient met sepsis criteria at time of admission with worsening hypoxia, elevated respiratory rate, fever, tachycardia and positive identify source of infection.   -Continue supportive care -Continue vitamin-C 500 mg p.o. Daily -Continue zinc 220 mg p.o. Daily -Continue mucolytic and nebulizer inhaler management -Continue Tylenol p.r.n. for fever -continue to follow inflammatory markers. -Patient uses supplemental oxygen at 2 LPM at baseline.  Good saturation at time of discharge using 2-3 L. -Strep pneumo antigen and Legionella antigen in urine  negative.   -Patient has completed treatment with molnupiravir for COVID.   Ecoli Proteus mirabilis UTI -Treated with IV Rocephin while inpatient; at discharge will complete therapy with oral Augmentin. -Patient afebrile, WBC is within normal limits and no complaining of dysuria.   Acute on Chronic hyponatremia Na 122 >> 127>130 -Lasix may was contributing factor -Electrolytes now within normal limits. -Continue to maintain adequate hydration -Safe to resume prior to admission diuretics management (dose has been adjusted). -Avoid dehydration    Hypoalbuminemia possibly secondary to moderate protein calorie malnutrition -Albumin 2.7, recommending the use of Ensure/feeding supplement. -Patient advised to increased oral intake.  COPD  with  acute on chronic hypoxic respiratory failure secondary to COVID-19 infection -At baseline patient uses 2 L of nasal cannula intermittently; currently demonstrating good oxygen saturation on 2-3 L nasal cannula supplementation. -Continue albuterol/Atrovent, incentive spirometry, flutter valve and resumption of her schedule maintenance inhaler therapy for COPD. -Successfully completed Paxlovid.   Essential hypertension -Continue losartan -Heart healthy diet encouraged.   Hypokalemia -Repleted and patient discharged on daily maintenance supplementation.  Chronic HFpEF -LVEF done in the 2019 showed LVEF of 60 to 19% with LV diastolic parameters showing G1 DD -Patient does not appear fluid overloaded; condition compensated. -Continue aspirin and losartan  -Continue following daily weights and heart healthy diet. -Safe to resume adjusted dose of diuretics at discharge. Dementia -Continue Aricept -Continue constant reorientation and supportive care.   Social/Ethics-- Discussed with son Mr Lashea Goda , he confirms DNR/DNI status.  Generalized weakness and ambulatory dysfunction--at baseline patient is essentially bedbound requires a hoyer lift for transfer. -Patient has around-the-clock caregivers at home according to patient's son [name]. -Patient will be discharged back home with instructions to follow-up with PCP in 2 weeks. -Transportation back home has been arranged.  Procedures: See below for x-ray reports.  Consultations: None  Discharge Exam: Vitals:   07/18/21 1259 07/18/21 1400  BP:  (!) 148/68  Pulse:  88  Resp:  20  Temp:  98 F (36.7 C)  SpO2: 99% 98%    General: Chronically ill in appearance; good oxygen saturation on 2-3 L nasal cannula supplementation.  No chest pain, no nausea, no vomiting.  Improvement appreciated in her oral intake and outside scattered coughing spells and positive rhonchi patient is medically stable and ready for discharge. Respiratory  system: Good saturation on 3 L nasal cannula supplementation positive diffuse rhonchi bilaterally; no using accessory muscles. Cardiovascular system: Rate controlled, no rubs, no gallops, no JVD on exam. Gastrointestinal system: Abdomen is nondistended, soft and nontender. No organomegaly or masses felt. Normal bowel sounds heard. Central nervous system: Generally weak; no new focal deficits Extremities: No cyanosis or clubbing. Skin: No rashes, no petechiae. Psychiatry: Mood & affect appropriate.   Discharge Instructions   Discharge Instructions     Diet - low sodium heart healthy   Complete by: As directed    Discharge instructions   Complete by: As directed    Take medications as prescribed Continue to follow the 3W's (Wash hand frequently, Wear a mask and Wait 6 feet apart) Maintain adequate nutrition/hydration Outpatient follow-up with PCP in 2 weeks.   Increase activity slowly   Complete by: As directed       Allergies as of 07/18/2021   No Known Allergies      Medication List     STOP taking these medications    zinc oxide 20 % ointment Commonly known as: [employer] Zinc Oxide       TAKE these medications    albuterol (2.5 MG/3ML) 0.083% nebulizer solution Commonly known as: PROVENTIL Take 2.5 mg by nebulization every 4 (four) hours as needed for wheezing or shortness of breath.   amoxicillin-clavulanate 875-125 MG tablet Commonly known as: Augmentin Take 1 tablet by mouth 2 (two) times daily for 4 days.   ascorbic acid 500 MG tablet Commonly known as: VITAMIN C Take 1 tablet (500 mg total) by mouth daily. Start taking on: July 19, 2021   aspirin 81 MG tablet Take 81 mg by mouth daily.   azelastine 0.1 % nasal spray Commonly known as: Astelin 2 sprays in each nostril at  bedtime. What changed:  how much to take how to take this when to take this additional instructions   dextromethorphan-guaiFENesin 30-600 MG 12hr tablet Commonly known as:  MUCINEX DM Take 1 tablet by mouth 2 (two) times daily.   donepezil 5 MG tablet Commonly known as: ARICEPT TAKE ONE TABLET AT BEDTIME   fluticasone 50 MCG/ACT nasal spray Commonly known as: FLONASE 2 SPRAYS IN EACH NOSTRIL QD What changed:  how much to take how to take this when to take this additional instructions   furosemide 40 MG tablet Commonly known as: LASIX Take 0.5 tablets (20 mg total) by mouth daily. What changed: how much to take   GAS RELIEF PO Take 1 tablet by mouth at bedtime.   ipratropium 17 MCG/ACT inhaler Commonly known as: ATROVENT HFA Inhale 2 puffs into the lungs every 8 (eight) hours.   IRON PO Take 1 tablet by mouth daily.   losartan 100 MG tablet Commonly known as: COZAAR TAKE ONE TABLET AT BEDTIME What changed: when to take this   MULTIPLE VITAMIN PO Take 1 tablet by mouth daily. One a day   pantoprazole 40 MG tablet Commonly known as: PROTONIX Take 1 tablet (40 mg total) by mouth daily. Start taking on: July 19, 2021   potassium chloride 10 MEQ CR capsule Commonly known as: MICRO-K TAKE TWO CAPSULES TWICE DAILY AS DIRECTED What changed: See the new instructions.   PROBIOTIC PO Take 1 tablet by mouth daily.   Trelegy Ellipta 100-62.5-25 MCG/ACT Aepb Generic drug: Fluticasone-Umeclidin-Vilant INHALE 1 PUFF DAILY AS DIRECTED What changed: See the new instructions.   vitamin B-12 500 MCG tablet Commonly known as: CYANOCOBALAMIN Take 1,000 mcg by mouth daily.   zinc sulfate 220 (50 Zn) MG capsule Take 1 capsule (220 mg total) by mouth daily. Start taking on: July 19, 2021       No Known Allergies  Follow-up Information     [name], FNP. Schedule an appointment as soon as possible for a visit in 2 week(s).   Specialty: Family Medicine Contact information: [place] [address] [phone]                 The results of significant diagnostics from this hospitalization (including  imaging, microbiology, ancillary and laboratory) are listed below for reference.    Significant Diagnostic Studies: DG Chest Port 1 View  Result Date: 07/13/2021 CLINICAL DATA:  Questionable sepsis. EXAM: PORTABLE CHEST 1 VIEW COMPARISON:  Chest x-ray 03/15/2018. FINDINGS: The aorta is tortuous with atherosclerotic calcifications. The heart is mildly enlarged, unchanged. There is central pulmonary vascular congestion. There are patchy airspace opacities in the right lung base. There is a small left pleural effusion. There is no evidence for pneumothorax or acute fracture. IMPRESSION: 1. Cardiomegaly with central pulmonary vascular congestion/mild edema. 2. Right lower lobe airspace disease, possibly pneumonia or aspiration. 3. Small left pleural effusion. Electronically Signed   By: Ronney Asters M.D.   On: 07/13/2021 18:13    Microbiology: Recent Results (from the past 240 hour(s))  Resp Panel by RT-PCR (Flu A&B, Covid) Nasopharyngeal Swab     Status: Abnormal   Collection Time: 07/13/21  6:15 PM   Specimen: Nasopharyngeal Swab; Nasopharyngeal(NP) swabs in vial transport medium  Result Value Ref Range Status   SARS Coronavirus 2 by RT PCR POSITIVE (A) NEGATIVE Final    Comment: (NOTE) SARS-CoV-2 target nucleic acids are DETECTED.  The SARS-CoV-2 RNA is generally detectable in upper respiratory specimens during  the acute phase of infection. Positive results are indicative of the presence of the identified virus, but do not rule out bacterial infection or co-infection with other pathogens not detected by the test. Clinical correlation with patient history and other diagnostic information is necessary to determine patient infection status. The expected result is Negative.  Fact Sheet for Patients: EntrepreneurPulse.com.au  Fact Sheet for Healthcare Providers: IncredibleEmployment.be  This test is not yet approved or cleared by the [place] FDA and   has been authorized for detection and/or diagnosis of SARS-CoV-2 by FDA under an Emergency Use Authorization (EUA).  This EUA will remain in effect (meaning this test can be used) for the duration of  the COVID-19 declaration under Section 564(b)(1) of the A ct, 21 U.S.C. section 360bbb-3(b)(1), unless the authorization is terminated or revoked sooner.     Influenza A by PCR NEGATIVE NEGATIVE Final   Influenza B by PCR NEGATIVE NEGATIVE Final    Comment: (NOTE) The Xpert Xpress SARS-CoV-2/FLU/RSV plus assay is intended as an aid in the diagnosis of influenza from Nasopharyngeal swab specimens and should not be used as a sole basis for treatment. Nasal washings and aspirates are unacceptable for Xpert Xpress SARS-CoV-2/FLU/RSV testing.  Fact Sheet for Patients: EntrepreneurPulse.com.au  Fact Sheet for Healthcare Providers: IncredibleEmployment.be  This test is not yet approved or cleared by the [place] FDA and has been authorized for detection and/or diagnosis of SARS-CoV-2 by FDA under an Emergency Use Authorization (EUA). This EUA will remain in effect (meaning this test can be used) for the duration of the COVID-19 declaration under Section 564(b)(1) of the Act, 21 U.S.C. section 360bbb-3(b)(1), unless the authorization is terminated or revoked.  Performed at [facility], [address] [place], [place] [address]   Blood Culture (routine x 2)     Status: None   Collection Time: 07/13/21  6:20 PM   Specimen: BLOOD RIGHT HAND  Result Value Ref Range Status   Specimen Description   Final    BLOOD RIGHT HAND BOTTLES DRAWN AEROBIC AND ANAEROBIC   Special Requests Blood Culture adequate volume  Final   Culture   Final    NO GROWTH 5 DAYS Performed at [facility], [address] [place], [place] [address]    Report Status 07/18/2021 FINAL  Final  Blood Culture (routine x 2)     Status: None   Collection Time: 07/13/21  6:20 PM    Specimen: BLOOD RIGHT FOREARM  Result Value Ref Range Status   Specimen Description   Final    BLOOD RIGHT FOREARM BOTTLES DRAWN AEROBIC AND ANAEROBIC   Special Requests   Final    Blood Culture results may not be optimal due to an inadequate volume of blood received in culture bottles   Culture   Final    NO GROWTH 5 DAYS Performed at [facility], [address] [place], [place] [address]    Report Status 07/18/2021 FINAL  Final  Urine Culture     Status: Abnormal   Collection Time: 07/13/21  6:50 PM   Specimen: In/Out Cath Urine  Result Value Ref Range Status   Specimen Description   Final    IN/OUT CATH URINE Performed at [facility], [address] [place], [place] [address]    Special Requests   Final    NONE Performed at [facility], [address], [place], [place] [address]    Culture (A)  Final    >=100,000 COLONIES/mL ESCHERICHIA COLI 10,000 COLONIES/mL PROTEUS  MIRABILIS    Report Status 07/18/2021 FINAL  Final   Organism ID, Bacteria ESCHERICHIA COLI (A)  Final   Organism ID, Bacteria PROTEUS MIRABILIS (A)  Final      Susceptibility   Escherichia coli - MIC*    AMPICILLIN 16 INTERMEDIATE Intermediate     CEFAZOLIN <=4 SENSITIVE Sensitive     CEFEPIME <=0.12 SENSITIVE Sensitive     CEFTRIAXONE <=0.25 SENSITIVE Sensitive     CIPROFLOXACIN >=4 RESISTANT Resistant     GENTAMICIN <=1 SENSITIVE Sensitive     IMIPENEM <=0.25 SENSITIVE Sensitive     NITROFURANTOIN <=16 SENSITIVE Sensitive     TRIMETH/SULFA <=20 SENSITIVE Sensitive     AMPICILLIN/SULBACTAM 4 SENSITIVE Sensitive     PIP/TAZO <=4 SENSITIVE Sensitive     * >=100,000 COLONIES/mL ESCHERICHIA COLI   Proteus mirabilis - MIC*    AMPICILLIN <=2 SENSITIVE Sensitive     CEFAZOLIN <=4 SENSITIVE Sensitive     CEFEPIME <=0.12 SENSITIVE Sensitive     CEFTRIAXONE <=0.25 SENSITIVE Sensitive     CIPROFLOXACIN >=4 RESISTANT Resistant     GENTAMICIN <=1 SENSITIVE Sensitive     IMIPENEM 2 SENSITIVE Sensitive      NITROFURANTOIN 128 RESISTANT Resistant     TRIMETH/SULFA <=20 SENSITIVE Sensitive     AMPICILLIN/SULBACTAM <=2 SENSITIVE Sensitive     PIP/TAZO <=4 SENSITIVE Sensitive     * 10,000 COLONIES/mL PROTEUS MIRABILIS     Labs: Basic Metabolic Panel: Recent Labs  Lab 07/14/21 0631 07/14/21 0941 07/15/21 0551 07/16/21 0637 07/17/21 0733 07/18/21 0716  NA 125* 127* 130* 133*   133* 134* 138  K 3.3* 3.3* 2.8* 3.2*   3.1* 3.1* 3.6  CL 91* 91* 95* 100   99 100 102  CO2 $Re'27 28 25 27   28 28 30  'Eqd$ GLUCOSE 127* 138* 112* 117*   117* 98 103*  BUN $Re'14 13 12 12   12 13 12  'Aqw$ CREATININE 0.68 0.71 0.58 0.64   0.64 0.68 0.67  CALCIUM 7.4* 7.5* 7.6* 7.6*   7.5* 7.5* 7.7*  MG 1.7  --  1.9 2.5* 2.3 2.2  PHOS 2.3*  --  2.0* 1.7*   1.7* 1.7* 1.8*   Liver Function Tests: Recent Labs  Lab 07/14/21 0631 07/15/21 0551 07/16/21 0637 07/17/21 0733 07/18/21 0716  AST $Re'16 19 18 15 'bzg$ 14*  ALT $Re'9 12 15 13 12  'fUp$ ALKPHOS 63 66 63 60 59  BILITOT 0.4 0.3 0.3 0.6 0.2*  PROT 6.8 6.8 6.5 6.4* 6.5  ALBUMIN 2.4* 2.4* 2.3*   2.3* 2.2* 2.2*    CBC: Recent Labs  Lab 07/14/21 0631 07/15/21 0551 07/16/21 0637 07/17/21 0733 07/18/21 0716  WBC 16.4* 14.3* 12.3* 10.5 8.0  NEUTROABS 13.4* 11.6* 9.8* 8.0* 6.2  HGB 9.8* 9.6* 9.5* 9.2* 9.1*  HCT 29.3* 28.7* 28.4* 28.5* 28.6*  MCV 94.8 97.0 96.9 97.3 97.9  PLT 256 273 270 275 273   Signed:  Barton Dubois MD.  [facility] 07/18/2021, 4:26 PM

## 2021-07-18 NOTE — Progress Notes (Signed)
EMS contacted to pick patient up at 7:30 p.m. once her infusion completes. Wells Guiles with EMS indicates that the shift ends at 8:00 and the will try to get patient.   Elantra Caprara, Clydene Pugh, LCSW

## 2021-07-18 NOTE — Progress Notes (Signed)
Nsg Discharge Note  Admit Date:  07/13/2021 Discharge date: 07/18/2021   Carrie Crawford to be D/C'd Home per MD order.  AVS completed.  Copy for chart, and copy for patient signed, and dated. Patient/caregiver able to verbalize understanding.  Discharge Medication: Allergies as of 07/18/2021   No Known Allergies      Medication List     STOP taking these medications    zinc oxide 20 % ointment Commonly known as: [employer] Zinc Oxide       TAKE these medications    albuterol (2.5 MG/3ML) 0.083% nebulizer solution Commonly known as: PROVENTIL Take 2.5 mg by nebulization every 4 (four) hours as needed for wheezing or shortness of breath.   amoxicillin-clavulanate 875-125 MG tablet Commonly known as: Augmentin Take 1 tablet by mouth 2 (two) times daily for 4 days.   ascorbic acid 500 MG tablet Commonly known as: VITAMIN C Take 1 tablet (500 mg total) by mouth daily. Start taking on: July 19, 2021   aspirin 81 MG tablet Take 81 mg by mouth daily.   azelastine 0.1 % nasal spray Commonly known as: Astelin 2 sprays in each nostril at bedtime. What changed:  how much to take how to take this when to take this additional instructions   dextromethorphan-guaiFENesin 30-600 MG 12hr tablet Commonly known as: MUCINEX DM Take 1 tablet by mouth 2 (two) times daily.   donepezil 5 MG tablet Commonly known as: ARICEPT TAKE ONE TABLET AT BEDTIME   fluticasone 50 MCG/ACT nasal spray Commonly known as: FLONASE 2 SPRAYS IN EACH NOSTRIL QD What changed:  how much to take how to take this when to take this additional instructions   furosemide 40 MG tablet Commonly known as: LASIX Take 0.5 tablets (20 mg total) by mouth daily. What changed: how much to take   GAS RELIEF PO Take 1 tablet by mouth at bedtime.   ipratropium 17 MCG/ACT inhaler Commonly known as: ATROVENT HFA Inhale 2 puffs into the lungs every 8 (eight) hours.   IRON PO Take 1 tablet by mouth daily.    losartan 100 MG tablet Commonly known as: COZAAR TAKE ONE TABLET AT BEDTIME What changed: when to take this   MULTIPLE VITAMIN PO Take 1 tablet by mouth daily. One a day   pantoprazole 40 MG tablet Commonly known as: PROTONIX Take 1 tablet (40 mg total) by mouth daily. Start taking on: July 19, 2021   potassium chloride 10 MEQ CR capsule Commonly known as: MICRO-K TAKE TWO CAPSULES TWICE DAILY AS DIRECTED What changed: See the new instructions.   PROBIOTIC PO Take 1 tablet by mouth daily.   Trelegy Ellipta 100-62.5-25 MCG/ACT Aepb Generic drug: Fluticasone-Umeclidin-Vilant INHALE 1 PUFF DAILY AS DIRECTED What changed: See the new instructions.   vitamin B-12 500 MCG tablet Commonly known as: CYANOCOBALAMIN Take 1,000 mcg by mouth daily.   zinc sulfate 220 (50 Zn) MG capsule Take 1 capsule (220 mg total) by mouth daily. Start taking on: July 19, 2021        Discharge Assessment: Vitals:   07/18/21 1400 07/18/21 1710  BP: (!) 148/68   Pulse: 88   Resp: 20   Temp: 98 F (36.7 C)   SpO2: 98% 98%   Skin clean, dry and intact without evidence of skin break down, no evidence of skin tears noted. IV catheter discontinued intact. Site without signs and symptoms of complications - no redness or edema noted at insertion site, patient denies c/o pain - only  slight tenderness at site.  Dressing with slight pressure applied.  D/c Instructions-Education: Discharge instructions given to patient/family with verbalized understanding. D/c education completed with patient/family including follow up instructions, medication list, d/c activities limitations if indicated, with other d/c instructions as indicated by MD - patient able to verbalize understanding, all questions fully answered. Patient instructed to return to ED, call 911, or call MD for any changes in condition.  Patient escorted via [place], and D/C home via private auto.  Dorcas Mcmurray, LPN 11/12/8411 2:44 PM

## 2021-07-21 ENCOUNTER — Telehealth: Payer: Self-pay

## 2021-07-21 NOTE — Telephone Encounter (Signed)
Transition Care Management Follow-up Telephone Call Date of discharge and from where: 07/18/21 - [facility] - septic pneumonia secondary to covid How have you been since you were released from the hospital? She seems to be doing better Any questions or concerns? No  Items Reviewed: Did the pt receive and understand the discharge instructions provided? Yes  Medications obtained and verified? Yes  Other? No  Any new allergies since your discharge? No  Dietary orders reviewed? Yes Do you have support at home? Yes   Home Care and Equipment/Supplies: Were home health services ordered? yes If so, what is the name of the agency? [facility]  Has the agency set up a time to come to the patient's home? yes Were any new equipment or medical supplies ordered?  No What is the name of the medical supply agency? N/a Were you able to get the supplies/equipment? not applicable Do you have any questions related to the use of the equipment or supplies? No  Functional Questionnaire: (I = Independent and D = Dependent) ADLs: D  Bathing/Dressing- D  Meal Prep- D  Eating- I  Maintaining continence- D  Transferring/Ambulation- D  Managing Meds- D  Follow up appointments reviewed:  PCP Hospital f/u appt confirmed? Yes  Scheduled to see [facility] since pcp on leave on 07/30/21 @ 11. [facility] f/u appt confirmed? No   Are transportation arrangements needed?  They have to call non-emergency medical transports to bring her as she is bedridden - if she is still feeling bad, they may change to video visit. If their condition worsens, is the pt aware to call PCP or go to the Emergency Dept.? Yes Was the patient provided with contact information for the PCP's office or ED? Yes Was to pt encouraged to call back with questions or concerns? Yes

## 2021-07-28 ENCOUNTER — Inpatient Hospital Stay: Payer: Medicare Other | Admitting: Nurse Practitioner

## 2021-07-29 ENCOUNTER — Encounter: Payer: Self-pay | Admitting: Family Medicine

## 2021-07-30 ENCOUNTER — Inpatient Hospital Stay: Payer: Medicare Other | Admitting: Nurse Practitioner

## 2021-08-01 ENCOUNTER — Encounter: Payer: Self-pay | Admitting: Nurse Practitioner

## 2021-08-01 ENCOUNTER — Ambulatory Visit (INDEPENDENT_AMBULATORY_CARE_PROVIDER_SITE_OTHER): Payer: Medicare Other | Admitting: Nurse Practitioner

## 2021-08-01 VITALS — BP 146/76 | HR 80 | Temp 98.0°F | Resp 20

## 2021-08-01 DIAGNOSIS — J189 Pneumonia, unspecified organism: Secondary | ICD-10-CM

## 2021-08-01 DIAGNOSIS — Z7689 Persons encountering health services in other specified circumstances: Secondary | ICD-10-CM

## 2021-08-01 DIAGNOSIS — J1282 Pneumonia due to coronavirus disease 2019: Secondary | ICD-10-CM

## 2021-08-01 DIAGNOSIS — L989 Disorder of the skin and subcutaneous tissue, unspecified: Secondary | ICD-10-CM | POA: Diagnosis not present

## 2021-08-01 MED ORDER — NYSTATIN 100000 UNIT/GM EX CREA: 1.0000 "application " | TOPICAL_CREAM | Freq: Two times a day (BID) | CUTANEOUS | 2 refills | Status: DC

## 2021-08-01 NOTE — Progress Notes (Signed)
Subjective:    Patient ID: Carrie Crawford, female    DOB: 15-May-1926, 88 y.o.   MRN: [id number]  Today's visit was for Transitional Care Management.  The patient was discharged from [facility] on 07/18/21 with a primary diagnosis of septic pneumonia secondary to covid.   Contact with the patient and/or caregiver, by a clinical staff member, was made on 07/21/21 and was documented as a telephone encounter within the EMR.  Through chart review and discussion with the patient I have determined that management of their condition is of moderate complexity.    Her son brought her in today. She is doing much better. She is bed ridden , but has been for 3 years. Her son is her caregiver. She is mentally good and is almost back to her baseline. Sh eis still on Oxygen and is on 3l. Good appetite and is alert.   Has some erythematous in perineal area- have used Aand D ointmnet which has not helped   Review of Systems  Constitutional:  Negative for diaphoresis.  Eyes:  Negative for pain.  Respiratory:  Negative for shortness of breath.   Cardiovascular:  Negative for chest pain, palpitations and leg swelling.  Gastrointestinal:  Negative for abdominal pain.  Endocrine: Negative for polydipsia.  Skin:  Negative for rash.  Neurological:  Negative for dizziness, weakness and headaches.  Hematological:  Does not bruise/bleed easily.  All other systems reviewed and are negative.     Objective:   Physical Exam Vitals reviewed.  Constitutional:      Appearance: Normal appearance.  Cardiovascular:     Rate and Rhythm: Normal rate and regular rhythm.     Heart sounds: Normal heart sounds.  Pulmonary:     Breath sounds: Normal breath sounds.     Comments: O2 at 3L vis [place] Musculoskeletal:     Comments: In wheel chair  Skin:    General: Skin is warm.     Comments: 3cm scaley lesion on both cheeks  Neurological:     General: No focal deficit present.     Mental Status: She is alert.    BP (!) 146/76    Pulse 80    Temp 98 F (36.7 C) (Temporal)    Resp 20    SpO2 100% Comment: 3 liters        Assessment & Plan:   Carrie Crawford in today with chief complaint of Transitions Of Care   1. Encounter for support and coordination of transition of care Hospital records reviewed  2. Pneumonia of both lower lobes due to infectious organism Will need to repeat chest xray in 5 weeks - CMP14+EGFR  3. Facial skin lesion Make appointment with dermatology  4. Perineal redness Keep as dry as possible Meds ordered this encounter  Medications   nystatin cream (MYCOSTATIN)    Sig: Apply 1 application topically 2 (two) times daily.    Dispense:  30 g    Refill:  2    Order Specific Question:   Supervising Provider    Answer:   Caryl Pina A [[id number]]     The above assessment and management plan was discussed with the patient. The patient verbalized understanding of and has agreed to the management plan. Patient is aware to call the clinic if symptoms persist or worsen. Patient is aware when to return to the clinic for a follow-up visit. Patient educated on when it is appropriate to go to the emergency department.   Mary-Margaret Hassell Done,  Lenna Gilford, [place]

## 2021-08-02 LAB — CMP14+EGFR
ALT: 6 IU/L (ref 0–32)
AST: 14 IU/L (ref 0–40)
Albumin/Globulin Ratio: 0.8 — ABNORMAL LOW (ref 1.2–2.2)
Albumin: 3.3 g/dL — ABNORMAL LOW (ref 3.5–4.6)
Alkaline Phosphatase: 83 IU/L (ref 44–121)
BUN/Creatinine Ratio: 12 (ref 12–28)
BUN: 9 mg/dL — ABNORMAL LOW (ref 10–36)
Bilirubin Total: 0.2 mg/dL (ref 0.0–1.2)
CO2: 28 mmol/L (ref 20–29)
Calcium: 8.6 mg/dL — ABNORMAL LOW (ref 8.7–10.3)
Chloride: 96 mmol/L (ref 96–106)
Creatinine, Ser: 0.75 mg/dL (ref 0.57–1.00)
Globulin, Total: 3.9 g/dL (ref 1.5–4.5)
Glucose: 105 mg/dL — ABNORMAL HIGH (ref 70–99)
Potassium: 5.3 mmol/L — ABNORMAL HIGH (ref 3.5–5.2)
Sodium: 134 mmol/L (ref 134–144)
Total Protein: 7.2 g/dL (ref 6.0–8.5)
eGFR: 74 mL/min/{1.73_m2} (ref >59)

## 2021-08-04 ENCOUNTER — Other Ambulatory Visit: Payer: Self-pay

## 2021-08-04 ENCOUNTER — Telehealth: Payer: Self-pay

## 2021-08-04 DIAGNOSIS — R159 Full incontinence of feces: Secondary | ICD-10-CM

## 2021-08-04 NOTE — Telephone Encounter (Signed)
Called and spoke with son [name] about the [place]. Advised that we wrote the order and our home health nurse would reach out to some different companies and see where we can get one for his mom. Also MMM ordered a repeat chest xray for 5 weeks out. Son was wanting to know if a mobile xray could be done. After discussing with home health nurse that cannot be done and must be repeated in office. Son aware and verbalized understanding.

## 2021-08-08 ENCOUNTER — Telehealth: Payer: Self-pay | Admitting: *Deleted

## 2021-08-08 NOTE — Telephone Encounter (Signed)
TC to son about the Pure wick system & supplies for pt's urinary incontinence. I talked to our rep w/ [facility]. He knows that the hospitals are using these and pt's are requesting them once they get out of the hospital. DME companies do not have them and insurance companies esp [employer] are not currently paying for them. Pt's could go online to the manufacturer to get them. He suggested [employer] out of [place]. He said that pt's are paying $500 out of pocket for the system and about $250 for the wicks monthly.

## 2021-08-14 ENCOUNTER — Other Ambulatory Visit: Payer: Self-pay | Admitting: Family

## 2021-08-14 DIAGNOSIS — J310 Chronic rhinitis: Secondary | ICD-10-CM

## 2021-08-14 MED ORDER — AZELASTINE HCL 0.1 % NA SOLN: NASAL | 3 refills | Status: DC

## 2021-08-14 NOTE — Telephone Encounter (Signed)
Spoke with patient son and she has been doing this because she was in a nursing home it look like it was not filled in 3 years can we refill please advise.

## 2021-08-23 ENCOUNTER — Other Ambulatory Visit: Payer: Self-pay | Admitting: Family Medicine

## 2021-09-12 ENCOUNTER — Other Ambulatory Visit: Payer: Self-pay | Admitting: Nurse Practitioner

## 2021-09-19 ENCOUNTER — Ambulatory Visit (INDEPENDENT_AMBULATORY_CARE_PROVIDER_SITE_OTHER): Payer: Medicare Other | Admitting: Nurse Practitioner

## 2021-09-19 ENCOUNTER — Encounter: Payer: Self-pay | Admitting: Nurse Practitioner

## 2021-09-19 ENCOUNTER — Ambulatory Visit (INDEPENDENT_AMBULATORY_CARE_PROVIDER_SITE_OTHER): Payer: Medicare Other

## 2021-09-19 VITALS — BP 107/51 | HR 75 | Temp 98.0°F | Ht 64.8 in

## 2021-09-19 DIAGNOSIS — J189 Pneumonia, unspecified organism: Secondary | ICD-10-CM

## 2021-09-19 NOTE — Progress Notes (Signed)
? ?Subjective:  ? ? Patient ID: Carrie Crawford, female    DOB: [birth year], 88 y.o.   MRN: [id number] ? ? ?Chief Complaint: medical management of chronic issues  ?  ? ?HPI: ? ?Carrie Crawford is a 88 y.o. who identifies as a female who was assigned female at birth.  ? ?Social history: ?Lives with: son ?Work history: retired ? ? ?Comes in today for follow up of the following chronic medical issues: ? ?1. Pneumonia of both lower lobes due to infectious organism ?Patient was in hospital with pneumonia the first of January. She comes in today for repeat chest xray for resolution. She denies couh or congestion, no rrecent fever. ? ? ?New complaints: ?None today ? ?No Known Allergies ?Outpatient Encounter Medications as of 09/19/2021  ?Medication Sig  ? albuterol (PROVENTIL) (2.5 MG/3ML) 0.083% nebulizer solution Take 2.5 mg by nebulization every 4 (four) hours as needed for wheezing or shortness of breath.  ? ascorbic acid (VITAMIN C) 500 MG tablet Take 1 tablet (500 mg total) by mouth daily.  ? aspirin 81 MG tablet Take 81 mg by mouth daily.  ? azelastine (ASTELIN) 0.1 % nasal spray 2 sprays in each nostril at bedtime.  ? dextromethorphan-guaiFENesin (MUCINEX DM) 30-600 MG 12hr tablet Take 1 tablet by mouth 2 (two) times daily.  ? donepezil (ARICEPT) 5 MG tablet TAKE ONE TABLET AT BEDTIME  ? Ferrous Sulfate (IRON PO) Take 1 tablet by mouth daily.  ? fluticasone (FLONASE) 50 MCG/ACT nasal spray 2 SPRAYS IN EACH NOSTRIL QD (Patient taking differently: Place 2 sprays into both nostrils daily.)  ? furosemide (LASIX) 40 MG tablet Take 0.5 tablets (20 mg total) by mouth daily.  ? ipratropium (ATROVENT HFA) 17 MCG/ACT inhaler Inhale 2 puffs into the lungs every 8 (eight) hours.  ? losartan (COZAAR) 100 MG tablet TAKE ONE TABLET AT BEDTIME (Patient taking differently: Take 100 mg by mouth daily.)  ? MULTIPLE VITAMIN PO Take 1 tablet by mouth daily. One a day  ? nystatin cream (MYCOSTATIN) Apply 1 application topically 2 (two)  times daily.  ? pantoprazole (PROTONIX) 40 MG tablet Take 1 tablet (40 mg total) by mouth daily.  ? potassium chloride (MICRO-K) 10 MEQ CR capsule TAKE TWO CAPSULES BY MOUTH TWICE DAILY AS DIRECTED  ? Probiotic Product (PROBIOTIC PO) Take 1 tablet by mouth daily.  ? Simethicone (GAS RELIEF PO) Take 1 tablet by mouth at bedtime.  ? TRELEGY ELLIPTA 100-62.5-25 MCG/ACT AEPB INHALE 1 PUFF DAILY AS DIRECTED (Patient taking differently: Inhale 1 puff into the lungs at bedtime.)  ? vitamin B-12 (CYANOCOBALAMIN) 500 MCG tablet Take 1,000 mcg by mouth daily.  ? zinc sulfate 220 (50 Zn) MG capsule TAKE ONE CAPSULE BY MOUTH DAILY  ? ?No facility-administered encounter medications on file as of 09/19/2021.  ? ? ?Past Surgical History:  ?Procedure Laterality Date  ? CATARACT EXTRACTION W/ INTRAOCULAR LENS  IMPLANT, BILATERAL Bilateral   ? CHOLECYSTECTOMY    ? FRACTURE SURGERY    ? OPEN REDUCTION INTERNAL FIXATION (ORIF) DISTAL RADIAL FRACTURE Right 03/16/2018  ? ORIF FEMUR FRACTURE Right 03/16/2018  ? Procedure: OPEN REDUCTION INTERNAL FIXATION (ORIF) DISTAL FEMUR FRACTURE;  Surgeon: Shona Needles, MD;  Location: [facility];  Service: Orthopedics;  Laterality: Right;  ? THYROIDECTOMY, PARTIAL  2004  ? [place]  ? ? ?Family History  ?Problem Relation Age of Onset  ? Heart disease Brother   ? Cancer Daughter   ?     endometial/uterine cancer(nodule in right  lung)  ? ? ? ? ?Controlled substance contract: n/a ? ? ? ? ?Review of Systems  ?Constitutional:  Negative for diaphoresis.  ?Eyes:  Negative for pain.  ?Respiratory:  Negative for shortness of breath.   ?Cardiovascular:  Negative for chest pain, palpitations and leg swelling.  ?Gastrointestinal:  Negative for abdominal pain.  ?Endocrine: Negative for polydipsia.  ?Skin:  Negative for rash.  ?Neurological:  Negative for dizziness, weakness and headaches.  ?Hematological:  Does not bruise/bleed easily.  ?All other systems reviewed and are negative. ? ?   ?Objective:  ? Physical  Exam ?Constitutional:   ?   Appearance: Normal appearance.  ?Cardiovascular:  ?   Rate and Rhythm: Normal rate and regular rhythm.  ?   Heart sounds: Normal heart sounds.  ?Pulmonary:  ?   Effort: Pulmonary effort is normal.  ?   Breath sounds: Normal breath sounds.  ?Skin: ?   General: Skin is warm.  ?Neurological:  ?   General: No focal deficit present.  ?   Mental Status: She is alert and oriented to person, place, and time.  ?Psychiatric:     ?   Mood and Affect: Mood normal.     ?   Behavior: Behavior normal.  ? ? ?BP (!) 107/51   Pulse 75   Temp 98 ?F (36.7 ?C) (Temporal)   Ht 5' 4.8" (1.646 m)   SpO2 99%   BMI 31.75 kg/m?  ? ?Chest xray improved- will wait on radiology report ? ? ?   ?Assessment & Plan:  ?AKITA MAXIM in today with chief complaint of Pneumonia and chest xray ? ? ?1. Pneumonia of both lower lobes due to infectious organism ?Waiting on radiology report ?- DG Chest 1 View ? ?Reviewed all meds with family ? ?The above assessment and management plan was discussed with the patient. The patient verbalized understanding of and has agreed to the management plan. Patient is aware to call the clinic if symptoms persist or worsen. Patient is aware when to return to the clinic for a follow-up visit. Patient educated on when it is appropriate to go to the emergency department.  ? ?Carrie Crawford Done, FNP ? ? ? ?

## 2021-09-22 ENCOUNTER — Other Ambulatory Visit: Payer: Self-pay | Admitting: Family Medicine

## 2021-10-03 ENCOUNTER — Telehealth: Payer: Self-pay | Admitting: *Deleted

## 2021-10-03 NOTE — Telephone Encounter (Signed)
Patients son called reporting that patient has recurring low grade fever.  Tylenol helps but it keeps coming back.  He reports weakness and diminished alertness.  I consulted Hendricks Limes, FNP. She states patient needs to be evaluated in person to determine treatment options. She advised Urgent Care, ED, or EMS.  I informed son and he verbalized understanding ?

## 2021-10-04 ENCOUNTER — Emergency Department (HOSPITAL_COMMUNITY): Payer: Medicare Other

## 2021-10-04 ENCOUNTER — Inpatient Hospital Stay (HOSPITAL_COMMUNITY)
Admission: EM | Admit: 2021-10-04 | Discharge: 2021-10-09 | DRG: 871 | Disposition: A | Discharge status: Home / Self Care | Payer: Medicare Other | Attending: Internal Medicine | Admitting: Internal Medicine

## 2021-10-04 ENCOUNTER — Other Ambulatory Visit: Payer: Self-pay

## 2021-10-04 DIAGNOSIS — Z7189 Other specified counseling: Secondary | ICD-10-CM | POA: Diagnosis not present

## 2021-10-04 DIAGNOSIS — Z6832 Body mass index (BMI) 32.0-32.9, adult: Secondary | ICD-10-CM

## 2021-10-04 DIAGNOSIS — N182 Chronic kidney disease, stage 2 (mild): Secondary | ICD-10-CM | POA: Diagnosis present

## 2021-10-04 DIAGNOSIS — H9190 Unspecified hearing loss, unspecified ear: Secondary | ICD-10-CM | POA: Diagnosis present

## 2021-10-04 DIAGNOSIS — E440 Moderate protein-calorie malnutrition: Secondary | ICD-10-CM | POA: Diagnosis present

## 2021-10-04 DIAGNOSIS — Z515 Encounter for palliative care: Secondary | ICD-10-CM | POA: Diagnosis not present

## 2021-10-04 DIAGNOSIS — R911 Solitary pulmonary nodule: Secondary | ICD-10-CM | POA: Diagnosis not present

## 2021-10-04 DIAGNOSIS — Z66 Do not resuscitate: Secondary | ICD-10-CM | POA: Diagnosis present

## 2021-10-04 DIAGNOSIS — I10 Essential (primary) hypertension: Secondary | ICD-10-CM | POA: Diagnosis not present

## 2021-10-04 DIAGNOSIS — J440 Chronic obstructive pulmonary disease with acute lower respiratory infection: Secondary | ICD-10-CM | POA: Diagnosis present

## 2021-10-04 DIAGNOSIS — Z8049 Family history of malignant neoplasm of other genital organs: Secondary | ICD-10-CM

## 2021-10-04 DIAGNOSIS — D72829 Elevated white blood cell count, unspecified: Secondary | ICD-10-CM | POA: Diagnosis not present

## 2021-10-04 DIAGNOSIS — E46 Unspecified protein-calorie malnutrition: Secondary | ICD-10-CM

## 2021-10-04 DIAGNOSIS — L89312 Pressure ulcer of right buttock, stage 2: Secondary | ICD-10-CM | POA: Diagnosis not present

## 2021-10-04 DIAGNOSIS — I5032 Chronic diastolic (congestive) heart failure: Secondary | ICD-10-CM

## 2021-10-04 DIAGNOSIS — K7460 Unspecified cirrhosis of liver: Secondary | ICD-10-CM | POA: Diagnosis present

## 2021-10-04 DIAGNOSIS — Z9049 Acquired absence of other specified parts of digestive tract: Secondary | ICD-10-CM

## 2021-10-04 DIAGNOSIS — A419 Sepsis, unspecified organism: Secondary | ICD-10-CM | POA: Diagnosis not present

## 2021-10-04 DIAGNOSIS — J188 Other pneumonia, unspecified organism: Secondary | ICD-10-CM

## 2021-10-04 DIAGNOSIS — Z20822 Contact with and (suspected) exposure to covid-19: Secondary | ICD-10-CM | POA: Diagnosis present

## 2021-10-04 DIAGNOSIS — Z8616 Personal history of COVID-19: Secondary | ICD-10-CM

## 2021-10-04 DIAGNOSIS — Z229 Carrier of infectious disease, unspecified: Secondary | ICD-10-CM | POA: Diagnosis not present

## 2021-10-04 DIAGNOSIS — I5030 Unspecified diastolic (congestive) heart failure: Secondary | ICD-10-CM | POA: Diagnosis present

## 2021-10-04 DIAGNOSIS — Z961 Presence of intraocular lens: Secondary | ICD-10-CM | POA: Diagnosis present

## 2021-10-04 DIAGNOSIS — L8990 Pressure ulcer of unspecified site, unspecified stage: Secondary | ICD-10-CM | POA: Insufficient documentation

## 2021-10-04 DIAGNOSIS — J154 Pneumonia due to other streptococci: Secondary | ICD-10-CM | POA: Diagnosis present

## 2021-10-04 DIAGNOSIS — Z8249 Family history of ischemic heart disease and other diseases of the circulatory system: Secondary | ICD-10-CM

## 2021-10-04 DIAGNOSIS — I130 Hypertensive heart and chronic kidney disease with heart failure and stage 1 through stage 4 chronic kidney disease, or unspecified chronic kidney disease: Secondary | ICD-10-CM | POA: Diagnosis present

## 2021-10-04 DIAGNOSIS — Z9841 Cataract extraction status, right eye: Secondary | ICD-10-CM

## 2021-10-04 DIAGNOSIS — M8580 Other specified disorders of bone density and structure, unspecified site: Secondary | ICD-10-CM | POA: Diagnosis present

## 2021-10-04 DIAGNOSIS — D631 Anemia in chronic kidney disease: Secondary | ICD-10-CM | POA: Diagnosis present

## 2021-10-04 DIAGNOSIS — E871 Hypo-osmolality and hyponatremia: Secondary | ICD-10-CM | POA: Diagnosis present

## 2021-10-04 DIAGNOSIS — E860 Dehydration: Secondary | ICD-10-CM | POA: Diagnosis not present

## 2021-10-04 DIAGNOSIS — E669 Obesity, unspecified: Secondary | ICD-10-CM | POA: Diagnosis present

## 2021-10-04 DIAGNOSIS — F0390 Unspecified dementia without behavioral disturbance: Secondary | ICD-10-CM | POA: Diagnosis present

## 2021-10-04 DIAGNOSIS — A403 Sepsis due to Streptococcus pneumoniae: Secondary | ICD-10-CM | POA: Diagnosis not present

## 2021-10-04 DIAGNOSIS — K219 Gastro-esophageal reflux disease without esophagitis: Secondary | ICD-10-CM | POA: Diagnosis not present

## 2021-10-04 DIAGNOSIS — E785 Hyperlipidemia, unspecified: Secondary | ICD-10-CM | POA: Diagnosis present

## 2021-10-04 DIAGNOSIS — Z9981 Dependence on supplemental oxygen: Secondary | ICD-10-CM

## 2021-10-04 DIAGNOSIS — J9611 Chronic respiratory failure with hypoxia: Secondary | ICD-10-CM | POA: Diagnosis present

## 2021-10-04 DIAGNOSIS — R54 Age-related physical debility: Secondary | ICD-10-CM | POA: Diagnosis present

## 2021-10-04 DIAGNOSIS — Z7982 Long term (current) use of aspirin: Secondary | ICD-10-CM

## 2021-10-04 DIAGNOSIS — Z9842 Cataract extraction status, left eye: Secondary | ICD-10-CM

## 2021-10-04 DIAGNOSIS — J449 Chronic obstructive pulmonary disease, unspecified: Secondary | ICD-10-CM | POA: Diagnosis not present

## 2021-10-04 DIAGNOSIS — R7881 Bacteremia: Secondary | ICD-10-CM

## 2021-10-04 DIAGNOSIS — J13 Pneumonia due to Streptococcus pneumoniae: Secondary | ICD-10-CM | POA: Diagnosis present

## 2021-10-04 DIAGNOSIS — J189 Pneumonia, unspecified organism: Secondary | ICD-10-CM | POA: Diagnosis not present

## 2021-10-04 DIAGNOSIS — E876 Hypokalemia: Secondary | ICD-10-CM | POA: Diagnosis not present

## 2021-10-04 DIAGNOSIS — Z7401 Bed confinement status: Secondary | ICD-10-CM | POA: Diagnosis not present

## 2021-10-04 DIAGNOSIS — L89322 Pressure ulcer of left buttock, stage 2: Secondary | ICD-10-CM | POA: Diagnosis not present

## 2021-10-04 DIAGNOSIS — E8809 Other disorders of plasma-protein metabolism, not elsewhere classified: Secondary | ICD-10-CM | POA: Diagnosis present

## 2021-10-04 DIAGNOSIS — M1990 Unspecified osteoarthritis, unspecified site: Secondary | ICD-10-CM | POA: Diagnosis present

## 2021-10-04 DIAGNOSIS — Z79899 Other long term (current) drug therapy: Secondary | ICD-10-CM

## 2021-10-04 LAB — URINALYSIS, ROUTINE W REFLEX MICROSCOPIC
Bilirubin Urine: NEGATIVE
Glucose, UA: NEGATIVE mg/dL
Ketones, ur: NEGATIVE mg/dL
Leukocytes,Ua: NEGATIVE
Nitrite: NEGATIVE
Protein, ur: 30 mg/dL — AB
Specific Gravity, Urine: 1.019 (ref 1.005–1.030)
pH: 5 (ref 5.0–8.0)

## 2021-10-04 LAB — COMPREHENSIVE METABOLIC PANEL
ALT: 12 U/L (ref 0–44)
AST: 15 U/L (ref 15–41)
Albumin: 2.4 g/dL — ABNORMAL LOW (ref 3.5–5.0)
Alkaline Phosphatase: 69 U/L (ref 38–126)
Anion gap: 8 (ref 5–15)
BUN: 21 mg/dL (ref 8–23)
CO2: 26 mmol/L (ref 22–32)
Calcium: 7.9 mg/dL — ABNORMAL LOW (ref 8.9–10.3)
Chloride: 95 mmol/L — ABNORMAL LOW (ref 98–111)
Creatinine, Ser: 0.93 mg/dL (ref 0.44–1.00)
GFR, Estimated: 57 mL/min — ABNORMAL LOW (ref >60)
Glucose, Bld: 124 mg/dL — ABNORMAL HIGH (ref 70–99)
Potassium: 4 mmol/L (ref 3.5–5.1)
Sodium: 129 mmol/L — ABNORMAL LOW (ref 135–145)
Total Bilirubin: 0.3 mg/dL (ref 0.3–1.2)
Total Protein: 7.2 g/dL (ref 6.5–8.1)

## 2021-10-04 LAB — RESP PANEL BY RT-PCR (FLU A&B, COVID) ARPGX2
Influenza A by PCR: NEGATIVE
Influenza B by PCR: NEGATIVE
SARS Coronavirus 2 by RT PCR: NEGATIVE

## 2021-10-04 LAB — CBC WITH DIFFERENTIAL/PLATELET
Abs Immature Granulocytes: 0.3 10*3/uL — ABNORMAL HIGH (ref 0.00–0.07)
Basophils Absolute: 0.1 10*3/uL (ref 0.0–0.1)
Basophils Relative: 0 %
Eosinophils Absolute: 0 10*3/uL (ref 0.0–0.5)
Eosinophils Relative: 0 %
HCT: 28 % — ABNORMAL LOW (ref 36.0–46.0)
Hemoglobin: 9.1 g/dL — ABNORMAL LOW (ref 12.0–15.0)
Immature Granulocytes: 1 %
Lymphocytes Relative: 9 %
Lymphs Abs: 2 10*3/uL (ref 0.7–4.0)
MCH: 31.6 pg (ref 26.0–34.0)
MCHC: 32.5 g/dL (ref 30.0–36.0)
MCV: 97.2 fL (ref 80.0–100.0)
Monocytes Absolute: 1.4 10*3/uL — ABNORMAL HIGH (ref 0.1–1.0)
Monocytes Relative: 6 %
Neutro Abs: 18.3 10*3/uL — ABNORMAL HIGH (ref 1.7–7.7)
Neutrophils Relative %: 84 %
Platelets: 302 10*3/uL (ref 150–400)
RBC: 2.88 MIL/uL — ABNORMAL LOW (ref 3.87–5.11)
RDW: 13 % (ref 11.5–15.5)
WBC: 22.1 10*3/uL — ABNORMAL HIGH (ref 4.0–10.5)
nRBC: 0 % (ref 0.0–0.2)

## 2021-10-04 LAB — PROTIME-INR
INR: 1.3 — ABNORMAL HIGH (ref 0.8–1.2)
Prothrombin Time: 16.4 seconds — ABNORMAL HIGH (ref 11.4–15.2)

## 2021-10-04 LAB — LACTIC ACID, PLASMA
Lactic Acid, Venous: 1 mmol/L (ref 0.5–1.9)
Lactic Acid, Venous: 1.6 mmol/L (ref 0.5–1.9)

## 2021-10-04 LAB — APTT: aPTT: 37 seconds — ABNORMAL HIGH (ref 24–36)

## 2021-10-04 MED ORDER — ENSURE ENLIVE PO LIQD
237.0000 mL | Freq: Two times a day (BID) | ORAL | Status: DC
  Administered 2021-10-05 – 2021-10-09 (×8): 237 mL via ORAL
  Filled 2021-10-04 (×2): qty 237

## 2021-10-04 MED ORDER — SODIUM CHLORIDE 0.9 % IV SOLN
500.0000 mg | INTRAVENOUS | Status: DC
  Administered 2021-10-04 – 2021-10-05 (×2): 500 mg via INTRAVENOUS
  Filled 2021-10-04 (×2): qty 5

## 2021-10-04 MED ORDER — SODIUM CHLORIDE 0.9 % IV SOLN
INTRAVENOUS | Status: AC
Start: 2021-10-04 — End: 2021-10-05

## 2021-10-04 MED ORDER — ACETAMINOPHEN 325 MG PO TABS: 650.0000 mg | ORAL_TABLET | Freq: Four times a day (QID) | ORAL | Status: DC | PRN

## 2021-10-04 MED ORDER — DM-GUAIFENESIN ER 30-600 MG PO TB12
1.0000 | ORAL_TABLET | Freq: Two times a day (BID) | ORAL | Status: DC
  Administered 2021-10-04 – 2021-10-09 (×10): 1 via ORAL
  Filled 2021-10-04 (×10): qty 1

## 2021-10-04 MED ORDER — SODIUM CHLORIDE 0.9 % IV SOLN
2.0000 g | INTRAVENOUS | Status: AC
  Administered 2021-10-04 – 2021-10-08 (×5): 2 g via INTRAVENOUS
  Filled 2021-10-04 (×5): qty 20

## 2021-10-04 MED ORDER — ENOXAPARIN SODIUM 40 MG/0.4ML IJ SOSY
40.0000 mg | PREFILLED_SYRINGE | INTRAMUSCULAR | Status: DC
  Administered 2021-10-05 – 2021-10-09 (×5): 40 mg via SUBCUTANEOUS
  Filled 2021-10-04 (×5): qty 0.4

## 2021-10-04 MED ORDER — LACTATED RINGERS IV BOLUS (SEPSIS)
1000.0000 mL | Freq: Once | INTRAVENOUS | Status: AC
  Administered 2021-10-04: 1000 mL via INTRAVENOUS

## 2021-10-04 NOTE — Assessment & Plan Note (Signed)
Continue IV hydration ?

## 2021-10-04 NOTE — ED Provider Notes (Signed)
?[facility] ?Provider Note ? ? ?CSN: [id number] ?Arrival date & time: 10/04/21  1543 ? ?  ? ?History ? ?Chief Complaint  ?Patient presents with  ? Fever  ?  Weakness  ? ? ?Carrie Crawford is a 88 y.o. female. ? ?Pt is a 88 yo female with pmhx significant for htn, dyslipidemia, osteopenia, chf, arthritis, copd, and ckd.  Pt lives at home with her son.  He gives most of the hx.  Pt has had a fever starting on 3/22.  Her son said they have been giving her tylenol which has helped the fever, but it keeps coming back.  She's also on oxygen via [place] at home.  She is normally on 1.75 L, but the son has had to increase it up to 2.5L.  The pt is bedbound normally.  She has not been eating or drinking as much as usual.  He said she has been sleeping more and has been more confused than usual.  She was admitted from 1/1 to 1/6 for Covid pneumonia.  Pt's son said she had a CXR since then and they were told it was ok.   ? ? ?  ? ?Home Medications ?Prior to Admission medications   ?Medication Sig Start Date End Date Taking? Authorizing Provider  ?albuterol (PROVENTIL) (2.5 MG/3ML) 0.083% nebulizer solution Take 2.5 mg by nebulization every 4 (four) hours as needed for wheezing or shortness of breath. 10/29/20  Yes [provider]  ?ascorbic acid (VITAMIN C) 500 MG tablet Take 1 tablet (500 mg total) by mouth daily. 07/19/21  Yes Barton Dubois, MD  ?aspirin 81 MG tablet Take 81 mg by mouth daily.   Yes [provider]  ?azelastine (ASTELIN) 0.1 % nasal spray 2 sprays in each nostril at bedtime. 08/14/21  Yes Chevis Pretty, FNP  ?dextromethorphan-guaiFENesin (MUCINEX DM) 30-600 MG 12hr tablet Take 1 tablet by mouth 2 (two) times daily. 07/18/21  Yes Barton Dubois, MD  ?donepezil (ARICEPT) 5 MG tablet TAKE ONE TABLET AT BEDTIME 09/12/21  Yes [name], FNP  ?Ferrous Sulfate (IRON PO) Take 1 tablet by mouth daily.   Yes [provider]  ?fluticasone (FLONASE) 50 MCG/ACT nasal  spray 2 SPRAYS IN EACH NOSTRIL QD ?Patient taking differently: Place 2 sprays into both nostrils daily. 05/14/21  Yes [name], FNP  ?furosemide (LASIX) 40 MG tablet Take 0.5 tablets (20 mg total) by mouth daily. 07/18/21  Yes Barton Dubois, MD  ?losartan (COZAAR) 100 MG tablet TAKE ONE TABLET AT BEDTIME ?Patient taking differently: Take 100 mg by mouth daily. 05/19/21  Yes Sharion Balloon, FNP  ?MULTIPLE VITAMIN PO Take 1 tablet by mouth daily. One a day   Yes [provider]  ?potassium chloride (MICRO-K) 10 MEQ CR capsule Take 2 capsules (20 mEq total) by mouth 2 (two) times daily. (NEEDS TO BE SEEN BEFORE NEXT REFILL) 09/22/21  Yes Stacks, Cletus Gash, MD  ?Probiotic Product (PROBIOTIC PO) Take 1 tablet by mouth daily.   Yes [provider]  ?Simethicone (GAS RELIEF PO) Take 1 tablet by mouth at bedtime.   Yes [provider]  ?sodium chloride (OCEAN) 0.65 % SOLN nasal spray Place 1 spray into both nostrils as needed for congestion.   Yes [provider]  ?Donnal Debar 100-62.5-25 MCG/ACT AEPB INHALE 1 PUFF DAILY AS DIRECTED ?Patient taking differently: Inhale 1 puff into the lungs at bedtime. 05/19/21  Yes Sharion Balloon, FNP  ?vitamin B-12 (CYANOCOBALAMIN) 500 MCG tablet Take  1,000 mcg by mouth daily.   Yes [provider]  ?ipratropium (ATROVENT HFA) 17 MCG/ACT inhaler Inhale 2 puffs into the lungs every 8 (eight) hours. ?Patient not taking: Reported on 10/04/2021 07/18/21   Barton Dubois, MD  ?nystatin cream (MYCOSTATIN) Apply 1 application topically 2 (two) times daily. ?Patient not taking: Reported on 10/04/2021 08/01/21   Chevis Pretty, FNP  ?pantoprazole (PROTONIX) 40 MG tablet Take 1 tablet (40 mg total) by mouth daily. ?Patient not taking: Reported on 10/04/2021 07/19/21   Barton Dubois, MD  ?zinc sulfate 220 (50 Zn) MG capsule TAKE ONE CAPSULE BY MOUTH DAILY ?Patient not taking: Reported on 10/04/2021 09/12/21   [name], FNP  ?   ? ?Allergies     ?Patient has no known allergies.   ? ?Review of Systems   ?Review of Systems  ?Constitutional:  Positive for appetite change and fever.  ?All other systems reviewed and are negative. ? ?Physical Exam ?Updated Vital Signs ?BP (!) 150/76 (BP Location: Left Arm)   Pulse 95   Temp 97.9 ?F (36.6 ?C) (Oral)   Resp (!) 23   Ht 5\' 4"  (1.626 m)   Wt 86 kg   SpO2 97%   BMI 32.54 kg/m?  ?Physical Exam ?Vitals and nursing note reviewed.  ?Constitutional:   ?   Appearance: She is ill-appearing.  ?HENT:  ?   Head: Normocephalic and atraumatic.  ?   Right Ear: External ear normal.  ?   Left Ear: External ear normal.  ?   Nose: Nose normal.  ?   Mouth/Throat:  ?   Mouth: Mucous membranes are dry.  ?Eyes:  ?   Extraocular Movements: Extraocular movements intact.  ?   Conjunctiva/sclera: Conjunctivae normal.  ?   Pupils: Pupils are equal, round, and reactive to light.  ?Cardiovascular:  ?   Rate and Rhythm: Normal rate and regular rhythm.  ?   Pulses: Normal pulses.  ?   Heart sounds: Normal heart sounds.  ?Pulmonary:  ?   Effort: Pulmonary effort is normal.  ?   Breath sounds: Rhonchi present.  ?Abdominal:  ?   General: Abdomen is flat. Bowel sounds are normal.  ?   Palpations: Abdomen is soft.  ?Musculoskeletal:  ?   Cervical back: Normal range of motion and neck supple.  ?Skin: ?   General: Skin is warm.  ?   Capillary Refill: Capillary refill takes less than 2 seconds.  ?Neurological:  ?   General: No focal deficit present.  ?   Mental Status: She is oriented to person, place, and time.  ?Psychiatric:     ?   Mood and Affect: Mood normal.     ?   Behavior: Behavior normal.     ?   Thought Content: Thought content normal.     ?   Judgment: Judgment normal.  ? ? ?ED Results / Procedures / Treatments   ?Labs ?(all labs ordered are listed, but only abnormal results are displayed) ?Labs Reviewed  ?COMPREHENSIVE METABOLIC PANEL - Abnormal; Notable for the following components:  ?    Result Value  ? Sodium 129 (*)   ? Chloride  95 (*)   ? Glucose, Bld 124 (*)   ? Calcium 7.9 (*)   ? Albumin 2.4 (*)   ? GFR, Estimated 57 (*)   ? All other components within normal limits  ?CBC WITH DIFFERENTIAL/PLATELET - Abnormal; Notable for the following components:  ? WBC 22.1 (*)   ? RBC  2.88 (*)   ? Hemoglobin 9.1 (*)   ? HCT 28.0 (*)   ? Neutro Abs 18.3 (*)   ? Monocytes Absolute 1.4 (*)   ? Abs Immature Granulocytes 0.30 (*)   ? All other components within normal limits  ?PROTIME-INR - Abnormal; Notable for the following components:  ? Prothrombin Time 16.4 (*)   ? INR 1.3 (*)   ? All other components within normal limits  ?APTT - Abnormal; Notable for the following components:  ? aPTT 37 (*)   ? All other components within normal limits  ?CULTURE, BLOOD (ROUTINE X 2)  ?RESP PANEL BY RT-PCR (FLU A&B, COVID) ARPGX2  ?CULTURE, BLOOD (ROUTINE X 2)  ?URINE CULTURE  ?LACTIC ACID, PLASMA  ?LACTIC ACID, PLASMA  ?URINALYSIS, ROUTINE W REFLEX MICROSCOPIC  ? ? ?EKG ?EKG Interpretation ? ?Date/Time:  Saturday October 04 2021 17:11:13 EDT ?Ventricular Rate:  80 ?PR Interval:  208 ?QRS Duration: 101 ?QT Interval:  374 ?QTC Calculation: 432 ?R Axis:   -24 ?Text Interpretation: Sinus rhythm Abnormal R-wave progression, early transition Left ventricular hypertrophy No significant change since last tracing Confirmed by Isla Pence [phone]) on 10/04/2021 5:50:03 PM ? ?Radiology ?DG Chest Port 1 View ? ?Result Date: 10/04/2021 ?CLINICAL DATA:  Altered level of consciousness, sepsis, weakness EXAM: PORTABLE CHEST 1 VIEW COMPARISON:  09/19/2021 FINDINGS: Single frontal view of the chest demonstrates a stable cardiac silhouette. Stable small loculated left pleural effusion. Interval development of small right pleural effusion. Increased vascular congestion, with bibasilar interstitial prominence and scattered areas of consolidation. Findings could reflect infection or edema. A 1.5 cm nodular opacity projecting over the left anterior second intercostal space is new since  prior exam, and likely reflects underlying airspace disease. Close follow-up would be needed to assess for resolution, and if this area persists a follow-up chest CT should be performed. No pneumothorax.  No acut

## 2021-10-04 NOTE — Assessment & Plan Note (Signed)
Continue losartan, temporarily hold Lasix due to dehydration ?

## 2021-10-04 NOTE — Assessment & Plan Note (Signed)
Continue supplemental oxygen per home regimen to maintain O2 sats > 94% ?

## 2021-10-04 NOTE — Progress Notes (Signed)
Elink following code sepsis °

## 2021-10-04 NOTE — Assessment & Plan Note (Signed)
Continue Aricept ?Continue constant reorientation and supportive care ?

## 2021-10-04 NOTE — ED Notes (Signed)
Patient son states that patient received 1,000mg  of tylenol this morning at 0730. ?

## 2021-10-04 NOTE — Assessment & Plan Note (Signed)
Chest x-ray was indicative of possible multifocal infection ?Continue management as described in sepsis ?

## 2021-10-04 NOTE — Assessment & Plan Note (Signed)
Albumin 2.4, this is possibly secondary to moderate protein calorie malnutrition ?Protein supplement will be provided ?

## 2021-10-04 NOTE — Assessment & Plan Note (Signed)
Chest x-ray showed nodularity projecting over the left upper lung zone, new since prior study, which could reflect airspace disease. ?PA and lateral chest x-ray is recommended in 3 to 4 weeks following trial of antibiotic therapy to ensure resolution and exclude underlying malignancy. ? ?

## 2021-10-04 NOTE — ED Triage Notes (Signed)
Patient brought in by [facility] for a fever that has been going on for a couple of days along with weakness.  EMS states that son reported that patient has also had a decrease in appetite.  Patient is bed confined. On 2 lpm of O2 at home.  ?

## 2021-10-04 NOTE — Assessment & Plan Note (Addendum)
Na 129, this is chronic.  This may be due to dehydration possibly secondary to diuretic use ?Continue gentle hydration ?Continue to monitor sodium with serial BMPs ?Urine osmolality, serum osmolality and urine sodium will be checked ? ?

## 2021-10-04 NOTE — H&P (Signed)
?History and Physical  ? ? ?Patient: Carrie Crawford YJE:[id number] DOB: 1926/05/03 ?DOA: 10/04/2021 ?DOS: the patient was seen and examined on 10/04/2021 ?PCP: [name], FNP  ?Patient coming from: Home ? ?Chief Complaint:  ?Chief Complaint  ?Patient presents with  ? Fever  ?  Weakness  ? ?HPI: Carrie Crawford is a 88 y.o. female with medical history significant of essential hypertension, COPD on supplemental oxygen at 3 LPM at baseline, diastolic CHF, hyperlipidemia who presents to the emergency department via EMS due to worsening shortness of breath.  Patient was unable to provide a history possibly due to underlying dementia, history was obtained from ED physician and ED medical record.  Per report, patient presents with fever, generalized weakness, cough and chest congestion which has been ongoing for about 3 days.  She was reported to have had a sick contact.  She was hard of hearing.  Patient is bedbound at baseline and requires a [employer] fast streamer for transfer. ? ?ED course: ?In the emergency department, she was tachypneic, BP was 115/49, other vital signs were within normal range.  Work-up in the ED showed leukocytosis, normocytic anemia, orthostatic BP will be checked to rule out postural hypotension, hyponatremia, Hypoalbuminemia.  Lactic acid x2 was negative, urinalysis was unimpressive for UTI. ?Chest x-ray showed increasing bibasilar interstitial and ground-glass opacities, consistent with edema or multifocal infection. New small right pleural effusion. Stable loculated left pleural effusion. Nodularity projecting over the left upper lung zone, new since prior study, which could reflect airspace disease ?She was treated with azithromycin and ceftriaxone, IV hydration was provided.  Hospitalist was asked to admit patient for further evaluation and management. ?  ?Review of Systems: As mentioned in the history of present illness. All other systems reviewed and are negative. ? ?Past Medical History:   ?Diagnosis Date  ? Arthritis   ? "spine" (03/16/2018)  ? CHF (congestive heart failure) ([place])   ? Chronic bronchitis ([place])   ? Chronic kidney disease   ? Compression fracture of lumbar vertebra (HCC)   ? COPD (chronic obstructive pulmonary disease) ([place])   ? Dyslipidemia   ? Hypertension   ? Multiple allergies   ? Osteopenia   ? Rhinitis, chronic   ? ?Past Surgical History:  ?Procedure Laterality Date  ? CATARACT EXTRACTION W/ INTRAOCULAR LENS  IMPLANT, BILATERAL Bilateral   ? CHOLECYSTECTOMY    ? FRACTURE SURGERY    ? OPEN REDUCTION INTERNAL FIXATION (ORIF) DISTAL RADIAL FRACTURE Right 03/16/2018  ? ORIF FEMUR FRACTURE Right 03/16/2018  ? Procedure: OPEN REDUCTION INTERNAL FIXATION (ORIF) DISTAL FEMUR FRACTURE;  Surgeon: Shona Needles, MD;  Location: [facility];  Service: Orthopedics;  Laterality: Right;  ? THYROIDECTOMY, PARTIAL  2004  ? [place]  ? ?Social History:  reports that she has never smoked. She has never used smokeless tobacco. She reports that she does not drink alcohol and does not use drugs. ? ?No Known Allergies ? ?Family History  ?Problem Relation Age of Onset  ? Heart disease Brother   ? Cancer Daughter   ?     endometial/uterine cancer(nodule in right lung)  ? ? ?Prior to Admission medications   ?Medication Sig Start Date End Date Taking? Authorizing Provider  ?albuterol (PROVENTIL) (2.5 MG/3ML) 0.083% nebulizer solution Take 2.5 mg by nebulization every 4 (four) hours as needed for wheezing or shortness of breath. 10/29/20  Yes [provider]  ?ascorbic acid (VITAMIN C) 500 MG tablet Take 1 tablet (500 mg total) by mouth daily. 07/19/21  Yes Barton Dubois, MD  ?aspirin 81 MG tablet Take 81 mg by mouth daily.   Yes [provider]  ?azelastine (ASTELIN) 0.1 % nasal spray 2 sprays in each nostril at bedtime. 08/14/21  Yes Chevis Pretty, FNP  ?dextromethorphan-guaiFENesin (MUCINEX DM) 30-600 MG 12hr tablet Take 1 tablet by mouth 2 (two) times daily. 07/18/21  Yes Barton Dubois, MD   ?donepezil (ARICEPT) 5 MG tablet TAKE ONE TABLET AT BEDTIME 09/12/21  Yes [name], FNP  ?Ferrous Sulfate (IRON PO) Take 1 tablet by mouth daily.   Yes [provider]  ?fluticasone (FLONASE) 50 MCG/ACT nasal spray 2 SPRAYS IN EACH NOSTRIL QD ?Patient taking differently: Place 2 sprays into both nostrils daily. 05/14/21  Yes [name], FNP  ?furosemide (LASIX) 40 MG tablet Take 0.5 tablets (20 mg total) by mouth daily. 07/18/21  Yes Barton Dubois, MD  ?losartan (COZAAR) 100 MG tablet TAKE ONE TABLET AT BEDTIME ?Patient taking differently: Take 100 mg by mouth daily. 05/19/21  Yes Sharion Balloon, FNP  ?MULTIPLE VITAMIN PO Take 1 tablet by mouth daily. One a day   Yes [provider]  ?potassium chloride (MICRO-K) 10 MEQ CR capsule Take 2 capsules (20 mEq total) by mouth 2 (two) times daily. (NEEDS TO BE SEEN BEFORE NEXT REFILL) 09/22/21  Yes Stacks, Cletus Gash, MD  ?Probiotic Product (PROBIOTIC PO) Take 1 tablet by mouth daily.   Yes [provider]  ?Simethicone (GAS RELIEF PO) Take 1 tablet by mouth at bedtime.   Yes [provider]  ?sodium chloride (OCEAN) 0.65 % SOLN nasal spray Place 1 spray into both nostrils as needed for congestion.   Yes [provider]  ?Donnal Debar 100-62.5-25 MCG/ACT AEPB INHALE 1 PUFF DAILY AS DIRECTED ?Patient taking differently: Inhale 1 puff into the lungs at bedtime. 05/19/21  Yes Sharion Balloon, FNP  ?vitamin B-12 (CYANOCOBALAMIN) 500 MCG tablet Take 1,000 mcg by mouth daily.   Yes [provider]  ?ipratropium (ATROVENT HFA) 17 MCG/ACT inhaler Inhale 2 puffs into the lungs every 8 (eight) hours. ?Patient not taking: Reported on 10/04/2021 07/18/21   Barton Dubois, MD  ?nystatin cream (MYCOSTATIN) Apply 1 application topically 2 (two) times daily. ?Patient not taking: Reported on 10/04/2021 08/01/21   Chevis Pretty, FNP  ?pantoprazole (PROTONIX) 40 MG tablet Take 1 tablet (40 mg total) by mouth daily. ?Patient  not taking: Reported on 10/04/2021 07/19/21   Barton Dubois, MD  ?zinc sulfate 220 (50 Zn) MG capsule TAKE ONE CAPSULE BY MOUTH DAILY ?Patient not taking: Reported on 10/04/2021 09/12/21   [name], FNP  ? ? ?Physical Exam: ?Vitals:  ? 10/04/21 1852 10/04/21 2100 10/04/21 2145 10/04/21 2315  ?BP:  (!) 152/68 131/89 105/66  ?Pulse:  95 95 95  ?Resp:  (!) 22 (!) 28 (!) 32  ?Temp: 98.4 ?F (36.9 ?C)     ?TempSrc: Oral     ?SpO2:  96% 98% 98%  ?Weight:      ?Height:      ? ?General: Elderly female lying in bed, ill appearing but not in any acute distress.  ?HEENT: NCAT.  PERRLA. EOMI. Sclerae anicteric.  Dry mucosal membranes. ?Neck: Neck supple without lymphadenopathy. No carotid bruits. No masses palpated.  ?Cardiovascular: Regular rate with normal S1-S2 sounds. No murmurs, rubs or gallops auscultated. No JVD.  ?Respiratory: Tachypnea, scattered rhonchi on auscultation.  No accessory muscle use. ?Abdomen: Soft, nontender, nondistended. Active bowel sounds. No masses or hepatosplenomegaly  ?Skin: No rashes,  lesions, or ulcerations.  Dry, warm to touch. ?Musculoskeletal:  2+ dorsalis pedis and radial pulses. Good ROM.  No contractures  ?Psychiatric:  Mood appropriate to current condition. ?Neurologic: No focal neurological deficits. Strength is 5/5 x 4.  CN II - XII grossly intact. ? ?Data Reviewed: ?EKG personally reviewed showed normal sinus rhythm at a rate of 80 bpm ? ?Assessment and Plan: ?* Sepsis ([place]) ?Patient met sepsis criteria due to being tachypneic and WBC was elevated with a left shift. ?Chest x-ray was indicative of possible multifocal infection ?Patient was empirically started on IV ceftriaxone and azithromycin, she will continue same at this time with plan to de-escalate based on urine culture, strep pneumo, sputum culture, blood culture and procalcitonin. ?Continue Tylenol as needed ?Continue Mucinex, incentive spirometry, flutter valve  ? ? ? ?GERD (gastroesophageal reflux disease) ?Continue  Protonix ? ?Dehydration ?Continue IV hydration ? ?Pulmonary nodule ?Chest x-ray showed nodularity projecting over the left upper lung zone, new since prior study, which could reflect airspace disease. ?PA and lateral

## 2021-10-04 NOTE — Assessment & Plan Note (Signed)
Continue albuterol and Breo Ellipta ?

## 2021-10-04 NOTE — Assessment & Plan Note (Signed)
Continue Protonix °

## 2021-10-04 NOTE — Assessment & Plan Note (Addendum)
Patient met sepsis criteria due to being tachypneic and WBC was elevated with a left shift. ?Chest x-ray was indicative of possible multifocal infection ?Patient was empirically started on IV ceftriaxone and azithromycin ?-Continue on current dosing with blood cultures demonstrating gram-positive cocci in 2 sets ?-Repeat blood cultures ordered and pending ?-2D echocardiogram ordered for evaluation of endocarditis ?Continue Tylenol as needed ?Continue Mucinex, incentive spirometry, flutter valve  ? ? ?

## 2021-10-04 NOTE — Assessment & Plan Note (Signed)
LVEF done in the 2019 showed LVEF of 60 to 37% with LV diastolic parameters showing G1 DD ?Patient does not appear fluid overloaded; Continue aspirin?and?losartan? ?Continue following daily weights and heart healthy diet. ?

## 2021-10-04 NOTE — Assessment & Plan Note (Signed)
Leukocytosis, this may be secondary to multifocal pneumonia ?Continue management as described in Sepsis ?

## 2021-10-05 DIAGNOSIS — A419 Sepsis, unspecified organism: Secondary | ICD-10-CM

## 2021-10-05 LAB — BLOOD CULTURE ID PANEL (REFLEXED) - BCID2

## 2021-10-05 LAB — CBC
HCT: 24.6 % — ABNORMAL LOW (ref 36.0–46.0)
Hemoglobin: 8.2 g/dL — ABNORMAL LOW (ref 12.0–15.0)
MCH: 33.1 pg (ref 26.0–34.0)
MCHC: 33.3 g/dL (ref 30.0–36.0)
MCV: 99.2 fL (ref 80.0–100.0)
Platelets: 298 10*3/uL (ref 150–400)
RBC: 2.48 MIL/uL — ABNORMAL LOW (ref 3.87–5.11)
RDW: 13.1 % (ref 11.5–15.5)
WBC: 18.2 10*3/uL — ABNORMAL HIGH (ref 4.0–10.5)
nRBC: 0 % (ref 0.0–0.2)

## 2021-10-05 LAB — COMPREHENSIVE METABOLIC PANEL
ALT: 10 U/L (ref 0–44)
AST: 12 U/L — ABNORMAL LOW (ref 15–41)
Albumin: 2 g/dL — ABNORMAL LOW (ref 3.5–5.0)
Alkaline Phosphatase: 59 U/L (ref 38–126)
Anion gap: 5 (ref 5–15)
BUN: 17 mg/dL (ref 8–23)
CO2: 27 mmol/L (ref 22–32)
Calcium: 7.6 mg/dL — ABNORMAL LOW (ref 8.9–10.3)
Chloride: 100 mmol/L (ref 98–111)
Creatinine, Ser: 0.67 mg/dL (ref 0.44–1.00)
GFR, Estimated: >60 mL/min (ref >60)
Glucose, Bld: 114 mg/dL — ABNORMAL HIGH (ref 70–99)
Potassium: 3.3 mmol/L — ABNORMAL LOW (ref 3.5–5.1)
Sodium: 132 mmol/L — ABNORMAL LOW (ref 135–145)
Total Bilirubin: 0.3 mg/dL (ref 0.3–1.2)
Total Protein: 6.2 g/dL — ABNORMAL LOW (ref 6.5–8.1)

## 2021-10-05 LAB — BASIC METABOLIC PANEL
Anion gap: 8 (ref 5–15)
BUN: 17 mg/dL (ref 8–23)
CO2: 24 mmol/L (ref 22–32)
Calcium: 7.6 mg/dL — ABNORMAL LOW (ref 8.9–10.3)
Chloride: 99 mmol/L (ref 98–111)
Creatinine, Ser: 0.74 mg/dL (ref 0.44–1.00)
GFR, Estimated: >60 mL/min (ref >60)
Glucose, Bld: 149 mg/dL — ABNORMAL HIGH (ref 70–99)
Potassium: 2.9 mmol/L — ABNORMAL LOW (ref 3.5–5.1)
Sodium: 131 mmol/L — ABNORMAL LOW (ref 135–145)

## 2021-10-05 LAB — MAGNESIUM: Magnesium: 2 mg/dL (ref 1.7–2.4)

## 2021-10-05 LAB — MRSA NEXT GEN BY PCR, NASAL: MRSA by PCR Next Gen: NOT DETECTED

## 2021-10-05 LAB — STREP PNEUMONIAE URINARY ANTIGEN: Strep Pneumo Urinary Antigen: NEGATIVE

## 2021-10-05 LAB — PROCALCITONIN: Procalcitonin: 0.52 ng/mL

## 2021-10-05 LAB — PHOSPHORUS: Phosphorus: 2.6 mg/dL (ref 2.5–4.6)

## 2021-10-05 MED ORDER — POTASSIUM CHLORIDE CRYS ER 20 MEQ PO TBCR
40.0000 meq | EXTENDED_RELEASE_TABLET | Freq: Once | ORAL | Status: AC
  Administered 2021-10-05: 40 meq via ORAL
  Filled 2021-10-05: qty 2

## 2021-10-05 MED ORDER — UMECLIDINIUM BROMIDE 62.5 MCG/ACT IN AEPB
1.0000 | INHALATION_SPRAY | Freq: Every day | RESPIRATORY_TRACT | Status: DC
  Administered 2021-10-05 – 2021-10-09 (×5): 1 via RESPIRATORY_TRACT
  Filled 2021-10-05: qty 7

## 2021-10-05 MED ORDER — FLUTICASONE FUROATE-VILANTEROL 100-25 MCG/ACT IN AEPB
1.0000 | INHALATION_SPRAY | Freq: Every day | RESPIRATORY_TRACT | Status: DC
  Administered 2021-10-05 – 2021-10-09 (×5): 1 via RESPIRATORY_TRACT
  Filled 2021-10-05: qty 28

## 2021-10-05 MED ORDER — LOSARTAN POTASSIUM 50 MG PO TABS
100.0000 mg | ORAL_TABLET | Freq: Every day | ORAL | Status: DC
  Administered 2021-10-05 – 2021-10-09 (×5): 100 mg via ORAL
  Filled 2021-10-05 (×5): qty 2

## 2021-10-05 MED ORDER — ASPIRIN 81 MG PO CHEW
81.0000 mg | CHEWABLE_TABLET | Freq: Every day | ORAL | Status: DC
  Administered 2021-10-05 – 2021-10-09 (×5): 81 mg via ORAL
  Filled 2021-10-05 (×5): qty 1

## 2021-10-05 MED ORDER — ZINC OXIDE 40 % EX OINT
TOPICAL_OINTMENT | Freq: Three times a day (TID) | CUTANEOUS | Status: DC | PRN
  Filled 2021-10-05: qty 57

## 2021-10-05 MED ORDER — DONEPEZIL HCL 5 MG PO TABS
5.0000 mg | ORAL_TABLET | Freq: Every day | ORAL | Status: DC
  Administered 2021-10-05 – 2021-10-08 (×4): 5 mg via ORAL
  Filled 2021-10-05 (×4): qty 1

## 2021-10-05 MED ORDER — PANTOPRAZOLE SODIUM 40 MG PO TBEC
40.0000 mg | DELAYED_RELEASE_TABLET | Freq: Every day | ORAL | Status: DC
  Administered 2021-10-05 – 2021-10-09 (×5): 40 mg via ORAL
  Filled 2021-10-05 (×5): qty 1

## 2021-10-05 MED ORDER — ALBUTEROL SULFATE (2.5 MG/3ML) 0.083% IN NEBU: 2.5000 mg | INHALATION_SOLUTION | RESPIRATORY_TRACT | Status: DC | PRN

## 2021-10-05 NOTE — Plan of Care (Signed)
  Problem: Education: Goal: Knowledge of General Education information will improve Description Including pain rating scale, medication(s)/side effects and non-pharmacologic comfort measures Outcome: Progressing   Problem: Health Behavior/Discharge Planning: Goal: Ability to manage health-related needs will improve Outcome: Progressing   

## 2021-10-05 NOTE — Progress Notes (Signed)
?PROGRESS NOTE ? ? ? ?Carrie Crawford  YJE:[id number] DOB: [birth year] DOA: 10/04/2021 ?PCP: [name], FNP ? ? ?Brief Narrative:  ?Per HPI: ?Carrie Crawford is a 88 y.o. female with medical history significant of essential hypertension, COPD on supplemental oxygen at 3 LPM at baseline, diastolic CHF, hyperlipidemia who presents to the emergency department via EMS due to worsening shortness of breath.  Patient was unable to provide a history possibly due to underlying dementia, history was obtained from ED physician and ED medical record.  Per report, patient presents with fever, generalized weakness, cough and chest congestion which has been ongoing for about 3 days.  She was reported to have had a sick contact.  She was hard of hearing.  Patient is bedbound at baseline and requires a [employer] fast streamer for transfer. ? ?10/05/21: Patient was admitted with sepsis, present on admission related to multifocal pneumonia and has been started on IV Rocephin and azithromycin.  She remains somewhat confused this AM.  She is noted to have gram-positive cocci in blood cultures.  2D echocardiogram has been ordered along with repeat cultures at this time.  Plan for ID evaluation by 3/27.  ? ? ?Assessment & Plan: ?  ?Principal Problem: ?  Sepsis ([place]) ?Active Problems: ?  COPD (chronic obstructive pulmonary disease) ([place]) ?  Essential hypertension ?  Dementia without behavioral disturbance ([place]) ?  Diastolic heart failure ([place]) ?  Hyponatremia ?  Multifocal pneumonia ?  Leukocytosis ?  Hypoalbuminemia due to protein-calorie malnutrition ([place]) ?  Chronic respiratory failure with hypoxia (HCC) ?  Pulmonary nodule ?  Dehydration ?  GERD (gastroesophageal reflux disease) ? ?Assessment and Plan: ?* Sepsis ([place]) ?Patient met sepsis criteria due to being tachypneic and WBC was elevated with a left shift. ?Chest x-ray was indicative of possible multifocal infection ?Patient was empirically started on IV ceftriaxone and  azithromycin ?-Continue on current dosing with blood cultures demonstrating gram-positive cocci in 2 sets ?-Repeat blood cultures ordered and pending ?-2D echocardiogram ordered for evaluation of endocarditis ?Continue Tylenol as needed ?Continue Mucinex, incentive spirometry, flutter valve  ? ? ? ?GERD (gastroesophageal reflux disease) ?Continue Protonix ? ?Dehydration ?Continue IV hydration ? ?Pulmonary nodule ?Chest x-ray showed nodularity projecting over the left upper lung zone, new since prior study, which could reflect airspace disease. ?PA and lateral chest x-ray is recommended in 3 to 4 weeks following trial of antibiotic therapy to ensure resolution and exclude underlying malignancy. ? ? ?Chronic respiratory failure with hypoxia (HCC) ?Continue supplemental oxygen per home regimen to maintain O2 sats > 94% ? ?Hypoalbuminemia due to protein-calorie malnutrition ([place]) ?Albumin 2.4, this is possibly secondary to moderate protein calorie malnutrition ?Protein supplement will be provided ? ?Leukocytosis ?Leukocytosis, this may be secondary to multifocal pneumonia ?Continue management as described in Sepsis ? ?Multifocal pneumonia ?Chest x-ray was indicative of possible multifocal infection ?Continue management as described in sepsis ? ?Hyponatremia ?Na 129, this is chronic.  This may be due to dehydration possibly secondary to diuretic use ?Continue gentle hydration ?Continue to monitor sodium with serial BMPs ?Urine osmolality, serum osmolality and urine sodium will be checked ? ? ?Diastolic heart failure ([place]) ?LVEF done in the 2019 showed LVEF of 60 to 63% with LV diastolic parameters showing G1 DD ?Patient does not appear fluid overloaded; Continue aspirin and losartan  ?Continue following daily weights and heart healthy diet. ? ?Dementia without behavioral disturbance ([place]) ?Continue Aricept ?Continue constant reorientation and supportive care ? ?Essential hypertension ?Continue losartan, temporarily hold  Lasix due to dehydration ? ?COPD (chronic obstructive pulmonary disease) ([place]) ?Continue albuterol and Breo Ellipta ? ? ? ?DVT prophylaxis: Lovenox ?Code Status: DNR ?Family Communication: Discussed with son on phone 3/26 ?Disposition Plan:  ?Status is: Inpatient ?Remains inpatient appropriate because: Need for IV antibiotics. ? ?Skin Assessment: ? ?I have examined the patient?s skin and I agree with the wound assessment as performed by the wound care RN as outlined below: ? ?Pressure Injury 10/05/21 Buttocks Right;Left Stage 2 -  Partial thickness loss of dermis presenting as a shallow open injury with a red, pink wound bed without slough. total area blanchable 5 cm X 4 cm, 2- 0.5cmx0.5cm stage 2 on each buttock (Active)  ?10/05/21 0203  ?Location: Buttocks  ?Location Orientation: Right;Left  ?Staging: Stage 2 -  Partial thickness loss of dermis presenting as a shallow open injury with a red, pink wound bed without slough.  ?Wound Description (Comments): total area blanchable 5 cm X 4 cm, 2- 0.5cmx0.5cm stage 2 on each buttock  ?Present on Admission:   ?Dressing Type None 10/05/21 0150  ? ? ?Consultants:  ?None ? ?Procedures:  ?See below ? ?Antimicrobials:  ?Anti-infectives (From admission, onward)  ? ? Start     Dose/Rate Route Frequency Ordered Stop  ? 10/04/21 1745  cefTRIAXone (ROCEPHIN) 2 g in sodium chloride 0.9 % 100 mL IVPB       ? 2 g ?200 mL/hr over 30 Minutes Intravenous Every 24 hours 10/04/21 1730 10/09/21 1744  ? 10/04/21 1745  azithromycin (ZITHROMAX) 500 mg in sodium chloride 0.9 % 250 mL IVPB       ? 500 mg ?250 mL/hr over 60 Minutes Intravenous Every 24 hours 10/04/21 1730 10/09/21 1744  ? ?  ? ?Subjective: ?Patient seen and evaluated today and is quite hard of hearing.  She appears confused. ? ?Objective: ?Vitals:  ? 10/05/21 0121 10/05/21 0449 10/05/21 0500 10/05/21 0739  ?BP: (!) 126/49 (!) 104/31    ?Pulse: 94 83    ?Resp: 18 20    ?Temp: 99.3 ?F (37.4 ?C) 99.7 ?F (37.6 ?C)    ?TempSrc:       ?SpO2: 95% 98%  97%  ?Weight:   85.7 kg   ?Height:      ? ? ?Intake/Output Summary (Last 24 hours) at 10/05/2021 1146 ?Last data filed at 10/05/2021 0551 ?Gross per 24 hour  ?Intake 1944.87 ml  ?Output --  ?Net 1944.87 ml  ? ?Filed Weights  ? 10/04/21 1611 10/05/21 0500  ?Weight: 86 kg 85.7 kg  ? ? ?Examination: ? ?General exam: Appears calm and comfortable  ?Respiratory system: Clear to auscultation. Respiratory effort normal.  On nasal cannula ?Cardiovascular system: S1 & S2 heard, RRR.  ?Gastrointestinal system: Abdomen is soft ?Central nervous system: Somnolent, but arousable and confused ?Extremities: No edema ?Skin: No significant lesions noted ?Psychiatry: Flat affect. ? ? ? ?Data Reviewed: I have personally reviewed following labs and imaging studies ? ?CBC: ?Recent Labs  ?Lab 10/04/21 ?1627 10/05/21 ?0620  ?WBC 22.1* 18.2*  ?NEUTROABS 18.3*  --   ?HGB 9.1* 8.2*  ?HCT 28.0* 24.6*  ?MCV 97.2 99.2  ?PLT 302 298  ? ?Basic Metabolic Panel: ?Recent Labs  ?Lab 10/04/21 ?1627 10/05/21 ?0620  ?NA 129* 132*  ?K 4.0 3.3*  ?CL 95* 100  ?CO2 26 27  ?GLUCOSE 124* 114*  ?BUN 21 17  ?CREATININE 0.93 0.67  ?CALCIUM 7.9* 7.6*  ?MG  --  2.0  ?PHOS  --  2.6  ? ?GFR: ?Estimated  Creatinine Clearance: 46.5 mL/min (by C-G formula based on SCr of 0.67 mg/dL). ?Liver Function Tests: ?Recent Labs  ?Lab 10/04/21 ?1627 10/05/21 ?0620  ?AST 15 12*  ?ALT 12 10  ?ALKPHOS 69 59  ?BILITOT 0.3 0.3  ?PROT 7.2 6.2*  ?ALBUMIN 2.4* 2.0*  ? ?No results for input(s): LIPASE, AMYLASE in the last 168 hours. ?No results for input(s): AMMONIA in the last 168 hours. ?Coagulation Profile: ?Recent Labs  ?Lab 10/04/21 ?1627  ?INR 1.3*  ? ?Cardiac Enzymes: ?No results for input(s): CKTOTAL, CKMB, CKMBINDEX, TROPONINI in the last 168 hours. ?BNP (last 3 results) ?No results for input(s): PROBNP in the last 8760 hours. ?HbA1C: ?No results for input(s): HGBA1C in the last 72 hours. ?CBG: ?No results for input(s): GLUCAP in the last 168 hours. ?Lipid  Profile: ?No results for input(s): CHOL, HDL, LDLCALC, TRIG, CHOLHDL, LDLDIRECT in the last 72 hours. ?Thyroid Function Tests: ?No results for input(s): TSH, T4TOTAL, FREET4, T3FREE, THYROIDAB in the last 72 hours. ?Anemia Panel: ?No

## 2021-10-05 NOTE — Hospital Course (Addendum)
Per HPI: ?[name] is a 88 y.o. female with medical history significant of essential hypertension, COPD on supplemental oxygen at 3 LPM at baseline, diastolic CHF, hyperlipidemia who presents to the emergency department via EMS due to worsening shortness of breath.  Patient was unable to provide a history possibly due to underlying dementia, history was obtained from ED physician and ED medical record.  Per report, patient presents with fever, generalized weakness, cough and chest congestion which has been ongoing for about 3 days.  She was reported to have had a sick contact.  She was hard of hearing.  Patient is bedbound at baseline and requires a [employer] fast streamer for transfer. ? ?10/05/21: Patient was admitted with sepsis, present on admission related to multifocal pneumonia and has been started on IV Rocephin and azithromycin.  She remains somewhat confused this AM.  She is noted to have gram-positive cocci in blood cultures.  2D echocardiogram has been ordered along with repeat cultures at this time.  Plan for ID evaluation by 3/27. ? ?3/27: Patient overall doing well with leukocytosis downtrending.  Repeat blood cultures with no growth noted thus far.  Azithromycin to be discontinued and remain on Rocephin only.  2D echocardiogram pending.  Appreciate ID recommendations for CT chest which will be ordered today.  Consider thoracentesis after further evaluation. ? ?3/28: Patient appears to be doing well.  Patient is not a candidate for TEE per cardiology.  She continues to remain on Rocephin with further ID recommendations pending.  CT chest with concerns for primary bronchogenic carcinoma.  Pulmonary consulted for further evaluation.  Palliative also consulted for goals of care. ? ?3/29: Patient overall doing well.  Plans are for conservative management and discharged home by a.m. if blood cultures continue to remain negative.  Discussed care with ID. ?

## 2021-10-06 ENCOUNTER — Inpatient Hospital Stay (HOSPITAL_COMMUNITY): Payer: Medicare Other

## 2021-10-06 DIAGNOSIS — R911 Solitary pulmonary nodule: Secondary | ICD-10-CM

## 2021-10-06 DIAGNOSIS — R7881 Bacteremia: Secondary | ICD-10-CM

## 2021-10-06 DIAGNOSIS — A419 Sepsis, unspecified organism: Secondary | ICD-10-CM | POA: Diagnosis not present

## 2021-10-06 HISTORY — DX: Solitary pulmonary nodule: R91.1

## 2021-10-06 LAB — BASIC METABOLIC PANEL
Anion gap: 5 (ref 5–15)
BUN: 17 mg/dL (ref 8–23)
CO2: 25 mmol/L (ref 22–32)
Calcium: 7.5 mg/dL — ABNORMAL LOW (ref 8.9–10.3)
Chloride: 104 mmol/L (ref 98–111)
Creatinine, Ser: 0.7 mg/dL (ref 0.44–1.00)
GFR, Estimated: >60 mL/min (ref >60)
Glucose, Bld: 109 mg/dL — ABNORMAL HIGH (ref 70–99)
Potassium: 3 mmol/L — ABNORMAL LOW (ref 3.5–5.1)
Sodium: 134 mmol/L — ABNORMAL LOW (ref 135–145)

## 2021-10-06 LAB — CBC
HCT: 25.3 % — ABNORMAL LOW (ref 36.0–46.0)
Hemoglobin: 8 g/dL — ABNORMAL LOW (ref 12.0–15.0)
MCH: 31 pg (ref 26.0–34.0)
MCHC: 31.6 g/dL (ref 30.0–36.0)
MCV: 98.1 fL (ref 80.0–100.0)
Platelets: 312 10*3/uL (ref 150–400)
RBC: 2.58 MIL/uL — ABNORMAL LOW (ref 3.87–5.11)
RDW: 13.2 % (ref 11.5–15.5)
WBC: 9.7 10*3/uL (ref 4.0–10.5)
nRBC: 0 % (ref 0.0–0.2)

## 2021-10-06 LAB — ECHOCARDIOGRAM COMPLETE
Area-P 1/2: 3.99 cm2
Height: 64 in
S' Lateral: 3.4 cm
Weight: 2892.44 oz

## 2021-10-06 LAB — MAGNESIUM: Magnesium: 2.1 mg/dL (ref 1.7–2.4)

## 2021-10-06 MED ORDER — POTASSIUM CHLORIDE 10 MEQ/100ML IV SOLN
10.0000 meq | INTRAVENOUS | Status: AC
  Administered 2021-10-06 (×4): 10 meq via INTRAVENOUS
  Filled 2021-10-06 (×4): qty 100

## 2021-10-06 NOTE — TOC Initial Note (Signed)
Transition of Care (TOC) - Initial/Assessment Note  ? ? ?Patient Details  ?Name: Carrie Crawford ?MRN: [id number] ?Date of Birth: 02-11-1926 ? ?Transition of Care (TOC) CM/SW Contact:    ?Iona Beard, LCSWA ?Phone Number: ?10/06/2021, 9:26 AM ? ?Clinical Narrative:                 ?Pt is high risk for readmission. CSW spoke with pts son to complete assessment due to pts orientation. Pt lives with her son. Pt is bed bound at baseline and has caregivers in the home from 9-2 and 5-11. Pts son is present if caregivers are not there. Pt has a hospital bed, hoyer lift, and oxygen in the home. Pt has had HH through [facility] last year when she returned home from a nursing facility. Pts son stated she had [facility] PT/OT/RN services. TOC to follow for needs.  ? ?Expected Discharge Plan: Home/Self Care ?Barriers to Discharge: Continued Medical Work up ? ? ?Patient Goals and CMS Choice ?Patient states their goals for this hospitalization and ongoing recovery are:: Return home ?CMS Medicare.gov Compare Post Acute Care list provided to:: Patient Represenative (must comment) ?Choice offered to / list presented to : Adult Children ? ?Expected Discharge Plan and Services ?Expected Discharge Plan: Home/Self Care ?  ?  ?Post Acute Care Choice: Resumption of Svcs/PTA Provider ?Living arrangements for the past 2 months: [place] ?                ?  ?  ?  ?  ?  ?  ?  ?  ?  ?  ? ?Prior Living Arrangements/Services ?Living arrangements for the past 2 months: [place] ?Lives with:: Adult Children ?Patient language and need for interpreter reviewed:: Yes ?Do you feel safe going back to the place where you live?: Yes      ?Need for Family Participation in Patient Care: Yes (Comment) ?Care giver support system in place?: Yes (comment) ?Current home services: DME, [facility] ?Criminal Activity/Legal Involvement Pertinent to Current Situation/Hospitalization: No - Comment as needed ? ?Activities of Daily Living ?  ?ADL Screening  (condition at time of admission) ?Patient's cognitive ability adequate to safely complete daily activities?: No ?Is the patient deaf or have difficulty hearing?: Yes ?Does the patient have difficulty seeing, even when wearing glasses/contacts?: Yes ?Does the patient have difficulty concentrating, remembering, or making decisions?: Yes ?Patient able to express need for assistance with ADLs?: No ?Does the patient have difficulty dressing or bathing?: Yes ?Independently performs ADLs?: No ?Communication: Independent ?Dressing (OT): Dependent ?Is this a change from baseline?: Pre-admission baseline ?Grooming: Dependent ?Is this a change from baseline?: Pre-admission baseline ?Feeding: Dependent ?Is this a change from baseline?: Pre-admission baseline ?Bathing: Dependent ?Is this a change from baseline?: Pre-admission baseline ?Toileting: Dependent ?Is this a change from baseline?: Pre-admission baseline ?In/Out Bed: Dependent ?Is this a change from baseline?: Pre-admission baseline ?Walks in Home: Dependent (bedbound) ?Is this a change from baseline?: Pre-admission baseline ?Does the patient have difficulty walking or climbing stairs?: Yes ?Weakness of Legs: Both ?Weakness of Arms/Hands: Both ? ?Permission Sought/Granted ?  ?  ?   ?   ?   ?   ? ?Emotional Assessment ?  ?Attitude/Demeanor/Rapport: Unable to Assess ?Affect (typically observed): Unable to Assess ?Orientation: : Oriented to Self ?Alcohol / Substance Use: Not Applicable ?Psych Involvement: No (comment) ? ?Admission diagnosis:  Hyponatremia [E87.1] ?Sepsis ([place]) [A41.9] ?Multifocal pneumonia [J18.9] ?Sepsis, due to unspecified organism, unspecified whether acute organ dysfunction present (  [place]) [A41.9] ?Patient Active Problem List  ? Diagnosis Date Noted  ? Sepsis ([place]) 10/04/2021  ? Pulmonary nodule 10/04/2021  ? Dehydration 10/04/2021  ? GERD (gastroesophageal reflux disease) 10/04/2021  ? Acute respiratory failure with hypoxia ([place])   ? Pneumonia due to  COVID-19 virus 07/13/2021  ? Multifocal pneumonia 07/13/2021  ? Leukocytosis 07/13/2021  ? Hypoalbuminemia due to protein-calorie malnutrition ([place]) 07/13/2021  ? Chronic respiratory failure with hypoxia ([place]) 07/13/2021  ? Chronic kidney disease, stage 2 (mild) 11/01/2020  ? Dementia without behavioral disturbance ([place]) 02/08/2019  ? 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 Aortic atherosclerosis ([place]) 04/14/2018  ? Right femoral fracture ([place]) 03/15/2018  ? Anemia 03/15/2018  ? Hyponatremia 03/15/2018  ? Hyperglycemia 04/29/2017  ? Obesity (BMI 30-39.9) 02/17/2017  ? OAB (overactive bladder) 02/16/2017  ? Age-related osteoporosis with current pathological fracture 04/23/2016  ? Estrogen deficiency 04/23/2016  ? Fracture of posterior thoracic vertebral body with routine healing 04/23/2016  ? Diastolic heart failure ([place]) 02/17/2016  ? Hyperlipidemia with target LDL less than 130   ? Essential hypertension 04/26/2013  ? Rhinitis, chronic 01/16/2011  ? COPD (chronic obstructive pulmonary disease) ([place]) 01/16/2011  ? ?PCP:  [name], FNP ?Pharmacy:   ?[facility] [address] ?[place] [address] ?Phone: [phone] Fax: [phone] ? ? ? ? ?Social Determinants of Health (SDOH) Interventions ?  ? ?Readmission Risk Interventions ? ?  10/06/2021  ?  9:25 AM  ?Readmission Risk Prevention Plan  ?Transportation Screening Complete  ?[place] or Home Care Consult Complete  ?Social Work Consult for [place] Planning/Counseling Complete  ?Palliative Care Screening Not Applicable  ?Medication Review Press photographer) Complete  ? ? ? ?

## 2021-10-06 NOTE — Progress Notes (Signed)
*  PRELIMINARY RESULTS* ?Echocardiogram ?2D Echocardiogram has been performed. ? ?Carrie Crawford ?10/06/2021, 12:54 PM ?

## 2021-10-06 NOTE — Plan of Care (Signed)
?  Problem: Education: ?Goal: Knowledge of General Education information will improve ?Description: Including pain rating scale, medication(s)/side effects and non-pharmacologic comfort measures ?Outcome: Progressing ?  ?Problem: Safety: ?Goal: Ability to remain free from injury will improve ?Outcome: Progressing ?  ?Problem: Pain Managment: ?Goal: General experience of comfort will improve ?Outcome: Progressing ?  ?

## 2021-10-06 NOTE — Consult Note (Addendum)
? ? ?[facility]  ?                                                                                     ? ?Patient Identification: ?Patient Name: Carrie Crawford MRN: [id number] [place] Date: 10/04/2021  3:52 PM ?Today's Date: 10/06/2021 ?Reason for consult: bacteremia  ?Requesting provider: Heath Lark ? ?Principal Problem: ?  Sepsis ([place]) ?Active Problems: ?  COPD (chronic obstructive pulmonary disease) ([place]) ?  Essential hypertension ?  Dementia without behavioral disturbance ([place]) ?  Diastolic heart failure ([place]) ?  Hyponatremia ?  Multifocal pneumonia ?  Leukocytosis ?  Hypoalbuminemia due to protein-calorie malnutrition ([place]) ?  Chronic respiratory failure with hypoxia (HCC) ?  Pulmonary nodule ?  Dehydration ?  GERD (gastroesophageal reflux disease) ? ? ?Antibiotics:  ?Ceftriaxone 3/25-c ?Azithromycin 3/25-c ? ?Lines/Hardware: ORIF rt distal radius ? ?Assessment ?Strep pneumoniae bacteremia  ?Multifocal PN/small Rt pleural effusion and loculated pleural effusion ( stable ) ?Recent COVID PNA in Jan 2023 s/p molnupiravir ?Leukocytosis - resolved  ? ?Recommendations  ?Continue ceftriaxone as is ?Fu repeat blood cultures/sensitivities of strep pneumo ?Fu TTE, may need TEE ?Recommend CT chest to evaluate the pleural effusion and need to do thoracentesis. Seems pulmonary source of bacteremia  ?Monitor CBC and CMP ?D/w primary  ?Following  ? ?Rest of the management as per the primary team. Please call with questions or concerns.  ?Thank you for the consult ? ?Rosiland Oz, MD ?Infectious Disease Physician ?[facility] ?[address] ?[place], [place] [address] ?Phone: [phone]  Fax: [phone] ? ?__________________________________________________________________________________________________________ ?HPI and Hospital Course: ?88 year old female with PMH of HTN ,  dyslipidemia , CHF , osteopenia , arthritis ORIF rt distal radius fracture, COPD/chronic respiratory failure on home oxygen 3 L, bedbound and CKD who presented to the ED on 3/25 for fever/decreased activity/worsening shortness of breath and confusion.  Reported to have a sick contact. admitted 1/1 to 1/6 for COVID-pneumonia ? ?At ED, noted to be tachypneic, afebrile ?Labs remarkable for leukocytosis, anemia and hypoalbuminemia. LA WNL ? ?Imaging is remarkable for ?Chest x-ray with increasing bibasilar interstitial and groundglass opacities consistent with edema or multifocal infection.  New small right pleural effusion.  Stable loculated left pleural effusion.  Polarity projecting over the left upper lung zone new since prior study, which could reflect airspace disease ? ?ROS: unavailable as virtual visit ? ?Past Medical History:  ?Diagnosis Date  ? Arthritis   ? "spine" (03/16/2018)  ? CHF (congestive heart failure) ([place])   ? Chronic bronchitis ([place])   ? Chronic kidney disease   ? Compression fracture of lumbar vertebra (HCC)   ? COPD (chronic obstructive pulmonary disease) ([place])   ? Dyslipidemia   ? Hypertension   ? Multiple allergies   ? Osteopenia   ? Rhinitis, chronic   ? ?Past Surgical History:  ?Procedure Laterality Date  ? CATARACT EXTRACTION W/ INTRAOCULAR LENS  IMPLANT, BILATERAL Bilateral   ? CHOLECYSTECTOMY    ? FRACTURE SURGERY    ? OPEN REDUCTION INTERNAL FIXATION (ORIF) DISTAL RADIAL FRACTURE Right 03/16/2018  ? ORIF FEMUR FRACTURE Right 03/16/2018  ?  Procedure: OPEN REDUCTION INTERNAL FIXATION (ORIF) DISTAL FEMUR FRACTURE;  Surgeon: Shona Needles, MD;  Location: [facility];  Service: Orthopedics;  Laterality: Right;  ? THYROIDECTOMY, PARTIAL  2004  ? byers  ? ? ? ?Scheduled Meds: ? aspirin  81 mg Oral Daily  ? dextromethorphan-guaiFENesin  1 tablet Oral BID  ? donepezil  5 mg Oral QHS  ? enoxaparin (LOVENOX) injection  40 mg Subcutaneous Q24H  ? feeding supplement  237 mL Oral BID BM  ? fluticasone  furoate-vilanterol  1 puff Inhalation Daily  ? And  ? umeclidinium bromide  1 puff Inhalation Daily  ? losartan  100 mg Oral Daily  ? pantoprazole  40 mg Oral Daily  ? ?Continuous Infusions: ? cefTRIAXone (ROCEPHIN)  IV 2 g (10/05/21 1748)  ? ?PRN Meds:.acetaminophen, albuterol, liver oil-zinc oxide ? ?No Known Allergies ? ?Social History  ? ?Socioeconomic History  ? Marital status: Widowed  ?  Spouse name: Not on file  ? Number of children: Not on file  ? Years of education: Not on file  ? Highest education level: Not on file  ?Occupational History  ? Not on file  ?Tobacco Use  ? Smoking status: Never  ? Smokeless tobacco: Never  ?Vaping Use  ? Vaping Use: Never used  ?Substance and Sexual Activity  ? Alcohol use: No  ?  Alcohol/week: 0.0 standard drinks  ? Drug use: No  ? Sexual activity: Not Currently  ?Other Topics Concern  ? Not on file  ?Social History Narrative  ? Not on file  ? ?Social Determinants of Health  ? ?Financial Resource Strain: Not on file  ?Food Insecurity: No Food Insecurity  ? Worried About Charity fundraiser in the Last Year: Never true  ? Ran Out of Food in the Last Year: Never true  ?Transportation Needs: No Transportation Needs  ? Lack of Transportation (Medical): No  ? Lack of Transportation (Non-Medical): No  ?Physical Activity: Not on file  ?Stress: Not on file  ?Social Connections: Socially Isolated  ? Frequency of Communication with Friends and Family: Never  ? Frequency of Social Gatherings with Friends and Family: More than three times a week  ? Attends Religious Services: Never  ? Active Member of Clubs or Organizations: No  ? Attends Archivist Meetings: Never  ? Marital Status: Widowed  ?Intimate Partner Violence: Not on file  ? ?Breast Cancer-relatedfamily history is not on file. ? ?Vitals ?BP 138/72 (BP Location: Left Arm)   Pulse 76   Temp 98.4 ?F (36.9 ?C)   Resp 19   Ht 5\' 4"  (1.626 m)   Wt 82 kg   SpO2 95%   BMI 31.03 kg/m?  ? ? ?Pertinent  Microbiology ?Results for orders placed or performed during the hospital encounter of 10/04/21  ?Blood Culture (routine x 2)     Status: Abnormal (Preliminary result)  ? Collection Time: 10/04/21  4:27 PM  ? Specimen: BLOOD RIGHT WRIST  ?Result Value Ref Range Status  ? Specimen Description   Final  ?  BLOOD RIGHT WRIST BOTTLES DRAWN AEROBIC AND ANAEROBIC ?Performed at [facility], [address], [place], [place] [address] ?  ? Special Requests   Final  ?  Blood Culture adequate volume ?Performed at [facility], [address], [place],  [address] ?  ? Culture  Setup Time   Final  ?  GRAM POSITIVE COCCI Gram Stain Report Called to,Read Back By and Verified With: V. BASS @ 1025 10/05/21  BY STEPHTR ?AEROBIC BOTTLE ONLY ?CRITICAL RESULT CALLED TO, READ BACK BY AND VERIFIED WITH: RN V.BASS ON [id number] AT 3354 BY E.PARRISH ?  ? Culture (A)  Final  ?  STREPTOCOCCUS PNEUMONIAE ?SUSCEPTIBILITIES TO FOLLOW ?Performed at [facility], [place] [address] [place], [place] [address] ?  ? Report Status PENDING  Incomplete  ?Blood Culture ID Panel (Reflexed)     Status: Abnormal  ? Collection Time: 10/04/21  4:27 PM  ?Result Value Ref Range Status  ? Enterococcus faecalis NOT DETECTED NOT DETECTED Final  ? Enterococcus Faecium NOT DETECTED NOT DETECTED Final  ? Listeria monocytogenes NOT DETECTED NOT DETECTED Final  ? Staphylococcus species NOT DETECTED NOT DETECTED Final  ? Staphylococcus aureus (BCID) NOT DETECTED NOT DETECTED Final  ? Staphylococcus epidermidis NOT DETECTED NOT DETECTED Final  ? Staphylococcus lugdunensis NOT DETECTED NOT DETECTED Final  ? Streptococcus species DETECTED (A) NOT DETECTED Final  ?  Comment: CRITICAL RESULT CALLED TO, READ BACK BY AND VERIFIED WITH: ?RN V.BASS ON [id number] AT 1608 BY E.PARRISH ?  ? Streptococcus agalactiae NOT DETECTED NOT DETECTED Final  ? Streptococcus pneumoniae DETECTED (A) NOT DETECTED Final  ?  Comment: CRITICAL RESULT CALLED TO, READ BACK BY AND VERIFIED WITH: ?RN  V.BASS ON [id number] AT 1608 BY E.PARRISH ?  ? Streptococcus pyogenes NOT DETECTED NOT DETECTED Final  ? A.calcoaceticus-baumannii NOT DETECTED NOT DETECTED Final  ? Bacteroides fragilis NOT DETECTED NOT DETECTED Final  ? Enterobacterales

## 2021-10-06 NOTE — Progress Notes (Signed)
?PROGRESS NOTE ? ? ? ?Carrie Crawford  OHY:[id number] DOB: 1925-09-04 DOA: 10/04/2021 ?PCP: [name], FNP ? ? ?Brief Narrative:  ?Per HPI: ?Carrie Crawford is a 88 y.o. female with medical history significant of essential hypertension, COPD on supplemental oxygen at 3 LPM at baseline, diastolic CHF, hyperlipidemia who presents to the emergency department via EMS due to worsening shortness of breath.  Patient was unable to provide a history possibly due to underlying dementia, history was obtained from ED physician and ED medical record.  Per report, patient presents with fever, generalized weakness, cough and chest congestion which has been ongoing for about 3 days.  She was reported to have had a sick contact.  She was hard of hearing.  Patient is bedbound at baseline and requires a [employer] fast streamer for transfer. ? ?10/05/21: Patient was admitted with sepsis, present on admission related to multifocal pneumonia and has been started on IV Rocephin and azithromycin.  She remains somewhat confused this AM.  She is noted to have gram-positive cocci in blood cultures.  2D echocardiogram has been ordered along with repeat cultures at this time.  Plan for ID evaluation by 3/27. ? ?3/27: Patient overall doing well with leukocytosis downtrending.  Repeat blood cultures with no growth noted thus far.  Azithromycin to be discontinued and remain on Rocephin only.  2D echocardiogram pending.  Appreciate ID recommendations for CT chest which will be ordered today.  Consider thoracentesis after further evaluation.  ? ? ?Assessment & Plan: ?  ?Principal Problem: ?  Sepsis ([place]) ?Active Problems: ?  COPD (chronic obstructive pulmonary disease) ([place]) ?  Essential hypertension ?  Dementia without behavioral disturbance ([place]) ?  Diastolic heart failure ([place]) ?  Hyponatremia ?  Multifocal pneumonia ?  Leukocytosis ?  Hypoalbuminemia due to protein-calorie malnutrition ([place]) ?  Chronic respiratory failure with hypoxia (HCC) ?  Pulmonary  nodule ?  Dehydration ?  GERD (gastroesophageal reflux disease) ? ?Assessment and Plan: ? ?Sepsis ([place]) ?Present on admission ?With now noted strep pneumonia bacteremia ?Patient met sepsis criteria due to being tachypneic and WBC was elevated with a left shift. ?Chest x-ray was indicative of possible multifocal infection ?Patient was empirically started on IV ceftriaxone and azithromycin, azithromycin discontinued 3/27 ?-Repeat blood cultures with no growth thus far ?-2D echocardiogram ordered for evaluation of endocarditis, pending ?CT chest to evaluate pleural effusion and consider thoracentesis ?Appreciate ID evaluation ?  ?  ?GERD (gastroesophageal reflux disease) ?Continue Protonix ?  ?Dehydration ?Hold further IV hydration ?  ?Pulmonary nodule ?Chest x-ray showed nodularity projecting over the left upper lung zone, new since prior study, which could reflect airspace disease. ?PA and lateral chest x-ray is recommended in 3 to 4 weeks following trial of antibiotic therapy to ensure resolution and exclude underlying malignancy. ?  ?  ?Chronic respiratory failure with hypoxia (HCC) ?Continue supplemental oxygen per home regimen to maintain O2 sats > 94% ?  ?Hypoalbuminemia due to protein-calorie malnutrition ([place]) ?Albumin 2.4, this is possibly secondary to moderate protein calorie malnutrition ?Protein supplement will be provided ?  ?Leukocytosis ?Resolved ?  ?Multifocal pneumonia ?Chest x-ray was indicative of possible multifocal infection ?Continue management as described in sepsis ?  ?Hyponatremia ?Improving, monitor ?Hold further IV fluid ?  ?  ?Diastolic heart failure ([place]) ?LVEF done in the 2019 showed LVEF of 60 to 94% with LV diastolic parameters showing G1 DD ?Patient does not appear fluid overloaded; Continue aspirin and losartan  ?Continue following daily weights and heart healthy diet. ?  ?  Dementia without behavioral disturbance ([place]) ?Continue Aricept ?Continue constant reorientation and supportive  care ?  ?Essential hypertension ?Continue losartan, temporarily hold Lasix due to dehydration ?  ?COPD (chronic obstructive pulmonary disease) ([place]) ?Continue albuterol and Breo Ellipta ?  ?  ?  ?DVT prophylaxis: Lovenox ?Code Status: DNR ?Family Communication: Discussed with son on phone 3/27 ?Disposition Plan:  ?Status is: Inpatient ?Remains inpatient appropriate because: Need for IV antibiotics. ?  ?Skin Assessment: ?  ?I have examined the patient?s skin and I agree with the wound assessment as performed by the wound care RN as outlined below: ?  ?    ?Pressure Injury 10/05/21 Buttocks Right;Left Stage 2 -  Partial thickness loss of dermis presenting as a shallow open injury with a red, pink wound bed without slough. total area blanchable 5 cm X 4 cm, 2- 0.5cmx0.5cm stage 2 on each buttock (Active)  ?10/05/21 0203  ?Location: Buttocks  ?Location Orientation: Right;Left  ?Staging: Stage 2 -  Partial thickness loss of dermis presenting as a shallow open injury with a red, pink wound bed without slough.  ?Wound Description (Comments): total area blanchable 5 cm X 4 cm, 2- 0.5cmx0.5cm stage 2 on each buttock  ?Present on Admission:   ?Dressing Type None 10/05/21 0150  ?  ?  ?Consultants:  ?None ?  ?Procedures:  ?See below ? ? Antimicrobials:  ?Anti-infectives (From admission, onward)  ? ? Start     Dose/Rate Route Frequency Ordered Stop  ? 10/04/21 1745  cefTRIAXone (ROCEPHIN) 2 g in sodium chloride 0.9 % 100 mL IVPB       ? 2 g ?200 mL/hr over 30 Minutes Intravenous Every 24 hours 10/04/21 1730 10/09/21 1744  ? 10/04/21 1745  azithromycin (ZITHROMAX) 500 mg in sodium chloride 0.9 % 250 mL IVPB  Status:  Discontinued       ? 500 mg ?250 mL/hr over 60 Minutes Intravenous Every 24 hours 10/04/21 1730 10/06/21 0815  ? ?  ? ?Subjective: ?Patient seen and evaluated today with no new acute complaints or concerns. No acute concerns or events noted overnight. ? ?Objective: ?Vitals:  ? 10/06/21 0500 10/06/21 0609 10/06/21  0831 10/06/21 1250  ?BP:  138/72  (!) 154/57  ?Pulse:  76  83  ?Resp:  19  (!) 22  ?Temp:  98.4 ?F (36.9 ?C)  97.7 ?F (36.5 ?C)  ?TempSrc:    Oral  ?SpO2:  97% 95% 97%  ?Weight: 82 kg     ?Height:      ? ? ?Intake/Output Summary (Last 24 hours) at 10/06/2021 1254 ?Last data filed at 10/06/2021 9470 ?Gross per 24 hour  ?Intake 1130 ml  ?Output 500 ml  ?Net 630 ml  ? ?Filed Weights  ? 10/04/21 1611 10/05/21 0500 10/06/21 0500  ?Weight: 86 kg 85.7 kg 82 kg  ? ? ?Examination: ? ?General exam: Appears calm and comfortable  ?Respiratory system: Clear to auscultation. Respiratory effort normal.  On nasal cannula oxygen ?Cardiovascular system: S1 & S2 heard, RRR.  ?Gastrointestinal system: Abdomen is soft ?Central nervous system: Alert and awake ?Extremities: No edema ?Skin: No significant lesions noted ?Psychiatry: Flat affect. ? ? ? ?Data Reviewed: I have personally reviewed following labs and imaging studies ? ?CBC: ?Recent Labs  ?Lab 10/04/21 ?1627 10/05/21 ?0620 10/06/21 ?0630  ?WBC 22.1* 18.2* 9.7  ?NEUTROABS 18.3*  --   --   ?HGB 9.1* 8.2* 8.0*  ?HCT 28.0* 24.6* 25.3*  ?MCV 97.2 99.2 98.1  ?PLT 302 298 312  ? ?  Basic Metabolic Panel: ?Recent Labs  ?Lab 10/04/21 ?1627 10/05/21 ?0620 10/05/21 ?1105 10/06/21 ?0630  ?NA 129* 132* 131* 134*  ?K 4.0 3.3* 2.9* 3.0*  ?CL 95* 100 99 104  ?CO2 $Rem'26 27 24 25  'rRrf$ ?GLUCOSE 124* 114* 149* 109*  ?BUN $Rem'21 17 17 17  'KWaE$ ?CREATININE 0.93 0.67 0.74 0.70  ?CALCIUM 7.9* 7.6* 7.6* 7.5*  ?MG  --  2.0  --  2.1  ?PHOS  --  2.6  --   --   ? ?GFR: ?Estimated Creatinine Clearance: 45.5 mL/min (by C-G formula based on SCr of 0.7 mg/dL). ?Liver Function Tests: ?Recent Labs  ?Lab 10/04/21 ?1627 10/05/21 ?0620  ?AST 15 12*  ?ALT 12 10  ?ALKPHOS 69 59  ?BILITOT 0.3 0.3  ?PROT 7.2 6.2*  ?ALBUMIN 2.4* 2.0*  ? ?No results for input(s): LIPASE, AMYLASE in the last 168 hours. ?No results for input(s): AMMONIA in the last 168 hours. ?Coagulation Profile: ?Recent Labs  ?Lab 10/04/21 ?1627  ?INR 1.3*  ? ?Cardiac  Enzymes: ?No results for input(s): CKTOTAL, CKMB, CKMBINDEX, TROPONINI in the last 168 hours. ?BNP (last 3 results) ?No results for input(s): PROBNP in the last 8760 hours. ?HbA1C: ?No results for input(s): HGB

## 2021-10-07 ENCOUNTER — Encounter (HOSPITAL_COMMUNITY): Payer: Self-pay | Admitting: Emergency Medicine

## 2021-10-07 DIAGNOSIS — A419 Sepsis, unspecified organism: Secondary | ICD-10-CM | POA: Diagnosis not present

## 2021-10-07 DIAGNOSIS — Z515 Encounter for palliative care: Secondary | ICD-10-CM

## 2021-10-07 DIAGNOSIS — L8990 Pressure ulcer of unspecified site, unspecified stage: Secondary | ICD-10-CM | POA: Insufficient documentation

## 2021-10-07 DIAGNOSIS — Z7189 Other specified counseling: Secondary | ICD-10-CM

## 2021-10-07 DIAGNOSIS — R7881 Bacteremia: Secondary | ICD-10-CM

## 2021-10-07 DIAGNOSIS — J440 Chronic obstructive pulmonary disease with acute lower respiratory infection: Secondary | ICD-10-CM

## 2021-10-07 DIAGNOSIS — J13 Pneumonia due to Streptococcus pneumoniae: Secondary | ICD-10-CM

## 2021-10-07 LAB — CULTURE, BLOOD (ROUTINE X 2): Special Requests: ADEQUATE

## 2021-10-07 LAB — BASIC METABOLIC PANEL
Anion gap: 5 (ref 5–15)
BUN: 15 mg/dL (ref 8–23)
CO2: 26 mmol/L (ref 22–32)
Calcium: 7.7 mg/dL — ABNORMAL LOW (ref 8.9–10.3)
Chloride: 103 mmol/L (ref 98–111)
Creatinine, Ser: 0.67 mg/dL (ref 0.44–1.00)
GFR, Estimated: >60 mL/min (ref >60)
Glucose, Bld: 105 mg/dL — ABNORMAL HIGH (ref 70–99)
Potassium: 2.9 mmol/L — ABNORMAL LOW (ref 3.5–5.1)
Sodium: 134 mmol/L — ABNORMAL LOW (ref 135–145)

## 2021-10-07 LAB — CBC
HCT: 25.4 % — ABNORMAL LOW (ref 36.0–46.0)
Hemoglobin: 8 g/dL — ABNORMAL LOW (ref 12.0–15.0)
MCH: 31.1 pg (ref 26.0–34.0)
MCHC: 31.5 g/dL (ref 30.0–36.0)
MCV: 98.8 fL (ref 80.0–100.0)
Platelets: 328 10*3/uL (ref 150–400)
RBC: 2.57 MIL/uL — ABNORMAL LOW (ref 3.87–5.11)
RDW: 13.1 % (ref 11.5–15.5)
WBC: 8.6 10*3/uL (ref 4.0–10.5)
nRBC: 0 % (ref 0.0–0.2)

## 2021-10-07 LAB — MAGNESIUM: Magnesium: 2 mg/dL (ref 1.7–2.4)

## 2021-10-07 LAB — HEPATITIS PANEL, ACUTE
HCV Ab: NONREACTIVE
Hep A IgM: NONREACTIVE
Hep B C IgM: NONREACTIVE
Hepatitis B Surface Ag: NONREACTIVE

## 2021-10-07 LAB — URINE CULTURE: Culture: 20000 — AB

## 2021-10-07 LAB — LEGIONELLA PNEUMOPHILA SEROGP 1 UR AG: L. pneumophila Serogp 1 Ur Ag: NEGATIVE

## 2021-10-07 MED ORDER — POTASSIUM CHLORIDE CRYS ER 20 MEQ PO TBCR
40.0000 meq | EXTENDED_RELEASE_TABLET | Freq: Once | ORAL | Status: AC
  Administered 2021-10-07: 40 meq via ORAL
  Filled 2021-10-07: qty 2

## 2021-10-07 MED ORDER — POTASSIUM CHLORIDE 10 MEQ/100ML IV SOLN
10.0000 meq | INTRAVENOUS | Status: AC
  Administered 2021-10-07 (×4): 10 meq via INTRAVENOUS
  Filled 2021-10-07 (×4): qty 100

## 2021-10-07 NOTE — Progress Notes (Signed)
?PROGRESS NOTE ? ? ? ?Carrie Crawford  YKZ:[id number] DOB: February 27, 1926 DOA: 10/04/2021 ?PCP: [name], FNP ? ? ?Brief Narrative:  ?Per HPI: ?Carrie Crawford is a 88 y.o. female with medical history significant of essential hypertension, COPD on supplemental oxygen at 3 LPM at baseline, diastolic CHF, hyperlipidemia who presents to the emergency department via EMS due to worsening shortness of breath.  Patient was unable to provide a history possibly due to underlying dementia, history was obtained from ED physician and ED medical record.  Per report, patient presents with fever, generalized weakness, cough and chest congestion which has been ongoing for about 3 days.  She was reported to have had a sick contact.  She was hard of hearing.  Patient is bedbound at baseline and requires a [employer] fast streamer for transfer. ? ?10/05/21: Patient was admitted with sepsis, present on admission related to multifocal pneumonia and has been started on IV Rocephin and azithromycin.  She remains somewhat confused this AM.  She is noted to have gram-positive cocci in blood cultures.  2D echocardiogram has been ordered along with repeat cultures at this time.  Plan for ID evaluation by 3/27. ? ?3/27: Patient overall doing well with leukocytosis downtrending.  Repeat blood cultures with no growth noted thus far.  Azithromycin to be discontinued and remain on Rocephin only.  2D echocardiogram pending.  Appreciate ID recommendations for CT chest which will be ordered today.  Consider thoracentesis after further evaluation. ? ?3/28: Patient appears to be doing well.  Patient is not a candidate for TEE per cardiology.  She continues to remain on Rocephin with further ID recommendations pending.  CT chest with concerns for primary bronchogenic carcinoma.  Pulmonary consulted for further evaluation.  Palliative also consulted for goals of care.  ? ? ?Assessment & Plan: ?  ?Principal Problem: ?  Sepsis ([place]) ?Active Problems: ?  COPD (chronic  obstructive pulmonary disease) ([place]) ?  Essential hypertension ?  Dementia without behavioral disturbance ([place]) ?  Diastolic heart failure ([place]) ?  Hyponatremia ?  Multifocal pneumonia ?  Leukocytosis ?  Hypoalbuminemia due to protein-calorie malnutrition ([place]) ?  Chronic respiratory failure with hypoxia (HCC) ?  Pulmonary nodule ?  Dehydration ?  GERD (gastroesophageal reflux disease) ?  Bacteremia ?  Pressure injury of skin ? ?Assessment and Plan: ? ? ?Sepsis ([place]) ?Present on admission ?With now noted strep pneumonia bacteremia ?Patient met sepsis criteria due to being tachypneic and WBC was elevated with a left shift. ?Chest x-ray was indicative of possible multifocal infection ?Patient was empirically started on IV ceftriaxone and azithromycin, azithromycin discontinued 3/27 ?-Repeat blood cultures with no growth thus far ?-TTE with no signs of endocarditis, TEE cannot be for performed as patient is not a good candidate ?CT chest to evaluate pleural effusion and consider thoracentesis ?Appreciate ongoing ID evaluation ?  ?  ?GERD (gastroesophageal reflux disease) ?Continue Protonix ?  ?Dehydration ?Hold further IV hydration ?  ?Pulmonary nodule ?Chest x-ray showed nodularity projecting over the left upper lung zone, new since prior study, which could reflect airspace disease. ?PA and lateral chest x-ray is recommended in 3 to 4 weeks following trial of antibiotic therapy to ensure resolution and exclude underlying malignancy. ?  ?  ?Chronic respiratory failure with hypoxia (HCC) ?Continue supplemental oxygen per home regimen to maintain O2 sats > 94% ?  ?Hypoalbuminemia due to protein-calorie malnutrition ([place]) ?Albumin 2.4, this is possibly secondary to moderate protein calorie malnutrition ?Protein supplement will be provided ?  ?  Leukocytosis ?Resolved ? ?Hypokalemia ?Replete and reevaluate in a.m. ?  ?Multifocal pneumonia ?Chest x-ray was indicative of possible multifocal infection ?Continue management as  described in sepsis ?  ?Hyponatremia ?Improving, monitor ?Hold further IV fluid ?  ?  ?Diastolic heart failure ([place]) ?LVEF done in the 2019 showed LVEF of 60 to 37% with LV diastolic parameters showing G1 DD ?Patient does not appear fluid overloaded; Continue aspirin and losartan  ?Continue following daily weights and heart healthy diet. ?  ?Dementia without behavioral disturbance ([place]) ?Continue Aricept ?Continue constant reorientation and supportive care ?  ?Essential hypertension ?Continue losartan, temporarily hold Lasix given hypokalemia ?  ?COPD (chronic obstructive pulmonary disease) ([place]) ?Continue albuterol and Breo Ellipta ?  ?  ?  ?DVT prophylaxis: Lovenox ?Code Status: DNR ?Family Communication: Discussed with son on phone 3/27 ?Disposition Plan:  ?Status is: Inpatient ?Remains inpatient appropriate because: Need for IV antibiotics. ?  ?Skin Assessment: ?  ?I have examined the patient?s skin and I agree with the wound assessment as performed by the wound care RN as outlined below: ?  ?       ?Pressure Injury 10/05/21 Buttocks Right;Left Stage 2 -  Partial thickness loss of dermis presenting as a shallow open injury with a red, pink wound bed without slough. total area blanchable 5 cm X 4 cm, 2- 0.5cmx0.5cm stage 2 on each buttock (Active)  ?10/05/21 0203  ?Location: Buttocks  ?Location Orientation: Right;Left  ?Staging: Stage 2 -  Partial thickness loss of dermis presenting as a shallow open injury with a red, pink wound bed without slough.  ?Wound Description (Comments): total area blanchable 5 cm X 4 cm, 2- 0.5cmx0.5cm stage 2 on each buttock  ?Present on Admission:   ?Dressing Type None 10/05/21 0150  ?  ?  ?Consultants:  ?ID ?Cardiology ?  ?Procedures:  ?See below ? ? Antimicrobials:  ?Anti-infectives (From admission, onward)  ? ? Start     Dose/Rate Route Frequency Ordered Stop  ? 10/04/21 1745  cefTRIAXone (ROCEPHIN) 2 g in sodium chloride 0.9 % 100 mL IVPB       ? 2 g ?200 mL/hr over 30 Minutes  Intravenous Every 24 hours 10/04/21 1730 10/09/21 1744  ? 10/04/21 1745  azithromycin (ZITHROMAX) 500 mg in sodium chloride 0.9 % 250 mL IVPB  Status:  Discontinued       ? 500 mg ?250 mL/hr over 60 Minutes Intravenous Every 24 hours 10/04/21 1730 10/06/21 0815  ? ?  ? ? ?Subjective: ?Patient seen and evaluated today with no new acute complaints or concerns. No acute concerns or events noted overnight. ? ?Objective: ?Vitals:  ? 10/06/21 1250 10/06/21 2130 10/07/21 0525 10/07/21 0837  ?BP: (!) 154/57 (!) 130/53 (!) 153/59   ?Pulse: 83 81 64   ?Resp: (!) $RemoveBe'22 19 20   'hpsdKVjcQ$ ?Temp: 97.7 ?F (36.5 ?C) 98.8 ?F (37.1 ?C) 98.2 ?F (36.8 ?C)   ?TempSrc: Oral Oral Oral   ?SpO2: 97% 99% 99% 96%  ?Weight:   81.6 kg   ?Height:      ? ? ?Intake/Output Summary (Last 24 hours) at 10/07/2021 1151 ?Last data filed at 10/07/2021 0900 ?Gross per 24 hour  ?Intake 1193.09 ml  ?Output 200 ml  ?Net 993.09 ml  ? ?Filed Weights  ? 10/05/21 0500 10/06/21 0500 10/07/21 0525  ?Weight: 85.7 kg 82 kg 81.6 kg  ? ? ?Examination: ? ?General exam: Appears calm and comfortable, hard of hearing ?Respiratory system: Clear to auscultation. Respiratory effort normal.  Nasal cannula  oxygen ?Cardiovascular system: S1 & S2 heard, RRR.  ?Gastrointestinal system: Abdomen is soft ?Central nervous system: Alert and awake ?Extremities: No edema ?Skin: No significant lesions noted ?Psychiatry: Flat affect. ? ? ? ?Data Reviewed: I have personally reviewed following labs and imaging studies ? ?CBC: ?Recent Labs  ?Lab 10/04/21 ?1627 10/05/21 ?0620 10/06/21 ?0630 10/07/21 ?7276  ?WBC 22.1* 18.2* 9.7 8.6  ?NEUTROABS 18.3*  --   --   --   ?HGB 9.1* 8.2* 8.0* 8.0*  ?HCT 28.0* 24.6* 25.3* 25.4*  ?MCV 97.2 99.2 98.1 98.8  ?PLT 302 298 312 328  ? ?Basic Metabolic Panel: ?Recent Labs  ?Lab 10/04/21 ?1627 10/05/21 ?0620 10/05/21 ?1105 10/06/21 ?0630 10/07/21 ?1848  ?NA 129* 132* 131* 134* 134*  ?K 4.0 3.3* 2.9* 3.0* 2.9*  ?CL 95* 100 99 104 103  ?CO2 $Rem'26 27 24 25 26  'yrKn$ ?GLUCOSE 124* 114*  149* 109* 105*  ?BUN $Rem'21 17 17 17 15  'JoHW$ ?CREATININE 0.93 0.67 0.74 0.70 0.67  ?CALCIUM 7.9* 7.6* 7.6* 7.5* 7.7*  ?MG  --  2.0  --  2.1 2.0  ?PHOS  --  2.6  --   --   --   ? ?GFR: ?Estimated Creatinine [place]

## 2021-10-07 NOTE — Progress Notes (Addendum)
? ?[facility] Infectious Diseases Follow Up Note ? ?Patient Identification: ?Patient Name: Carrie Crawford MRN: [id number] [place] Date: 10/04/2021  3:52 PM ?Age: 87 y.o.Today's Date: 10/07/2021 ? ? ?Reason for Visit: bacteremia ? ?Principal Problem: ?  Sepsis ([place]) ?Active Problems: ?  COPD (chronic obstructive pulmonary disease) ([place]) ?  Essential hypertension ?  Dementia without behavioral disturbance ([place]) ?  Diastolic heart failure ([place]) ?  Hyponatremia ?  Multifocal pneumonia ?  Leukocytosis ?  Hypoalbuminemia due to protein-calorie malnutrition ([place]) ?  Chronic respiratory failure with hypoxia (HCC) ?  Pulmonary nodule ?  Dehydration ?  GERD (gastroesophageal reflux disease) ?  Bacteremia ? ?Antibiotics:  ?Ceftriaxone 3/25-c ?Azithromycin 3/25-c ?  ?Lines/Hardware: ORIF rt distal radius ? ?Interval Events: remains afebrile, TTE and CT chest findings noted  ? ?Assessment ?Strep pneumoniae bacteremia  ?Multifocal PNA/small Rt pleural effusion and loculated pleural effusion ( stable ) with concerns for primary bronchogenic ca ?Recent COVID PNA in Jan 2023 s/p molnupiravir ?Possible Liver cirrhosis ?  ?Recommendations  ?Continue ceftriaxone as is ?Fu repeat blood cultures/sensitivities of strep pneumo ?TEE given TTE was a poor study ?Pulmonary consult given abnormal CT chest findings ?Check Hepatitis B and C serology given possible liver cirrhosis ?Monitor CBC and CMP ?Following  ? ?Rest of the management as per the primary team. ?Thank you for the consult. Please page with pertinent questions or concerns. ? ?______________________________________________________________________ ?Subjective ?Patient not seen and examined  ?Chart reviewed only ? ?Vitals ?BP (!) 153/59 (BP Location: Left Arm)   Pulse 64   Temp 98.2 ?F (36.8 ?C) (Oral)   Resp 20   Ht 5\' 4"  (1.626 m)   Wt 81.6 kg   SpO2 99%   BMI 30.88 kg/m?  ? ? ?Pertinent Microbiology ?Results for orders placed  or performed during the hospital encounter of 10/04/21  ?Blood Culture (routine x 2)     Status: Abnormal (Preliminary result)  ? Collection Time: 10/04/21  4:27 PM  ? Specimen: BLOOD RIGHT WRIST  ?Result Value Ref Range Status  ? Specimen Description   Final  ?  BLOOD RIGHT WRIST BOTTLES DRAWN AEROBIC AND ANAEROBIC ?Performed at [facility], [address], [place], [place] [address] ?  ? Special Requests   Final  ?  Blood Culture adequate volume ?Performed at [facility], [address], [place], [place] [address] ?  ? Culture  Setup Time   Final  ?  GRAM POSITIVE COCCI Gram Stain Report Called to,Read Back By and Verified With: V. BASS @ 1025 10/05/21 BY STEPHTR ?AEROBIC BOTTLE ONLY ?CRITICAL RESULT CALLED TO, READ BACK BY AND VERIFIED WITH: RN V.BASS ON [id number] AT 7253 BY E.PARRISH ?  ? Culture (A)  Final  ?  STREPTOCOCCUS PNEUMONIAE ?SUSCEPTIBILITIES TO FOLLOW ?Performed at [facility], [place] [address], [place], [place] [address] ?  ? Report Status PENDING  Incomplete  ?Blood Culture ID Panel (Reflexed)     Status: Abnormal  ? Collection Time: 10/04/21  4:27 PM  ?Result Value Ref Range Status  ? Enterococcus faecalis NOT DETECTED NOT DETECTED Final  ? Enterococcus Faecium NOT DETECTED NOT DETECTED Final  ? Listeria monocytogenes NOT DETECTED NOT DETECTED Final  ? Staphylococcus species NOT DETECTED NOT DETECTED Final  ? Staphylococcus aureus (BCID) NOT DETECTED NOT DETECTED Final  ? Staphylococcus epidermidis NOT DETECTED NOT DETECTED Final  ? Staphylococcus lugdunensis NOT DETECTED NOT DETECTED Final  ? Streptococcus species DETECTED (A) NOT DETECTED Final  ?  Comment: CRITICAL RESULT CALLED TO,  READ BACK BY AND VERIFIED WITH: ?RN V.BASS ON [id number] AT [id number] BY E.PARRISH ?  ? Streptococcus agalactiae NOT DETECTED NOT DETECTED Final  ? Streptococcus pneumoniae DETECTED (A) NOT DETECTED Final  ?  Comment: CRITICAL RESULT CALLED TO, READ BACK BY AND VERIFIED WITH: ?RN V.BASS ON [id number] AT 1608 BY  E.PARRISH ?  ? Streptococcus pyogenes NOT DETECTED NOT DETECTED Final  ? A.calcoaceticus-baumannii NOT DETECTED NOT DETECTED Final  ? Bacteroides fragilis NOT DETECTED NOT DETECTED Final  ? Enterobacterales NOT DETECTED NOT DETECTED Final  ? Enterobacter cloacae complex NOT DETECTED NOT DETECTED Final  ? Escherichia coli NOT DETECTED NOT DETECTED Final  ? Klebsiella aerogenes NOT DETECTED NOT DETECTED Final  ? Klebsiella oxytoca NOT DETECTED NOT DETECTED Final  ? Klebsiella pneumoniae NOT DETECTED NOT DETECTED Final  ? Proteus species NOT DETECTED NOT DETECTED Final  ? Salmonella species NOT DETECTED NOT DETECTED Final  ? Serratia marcescens NOT DETECTED NOT DETECTED Final  ? Haemophilus influenzae NOT DETECTED NOT DETECTED Final  ? Neisseria meningitidis NOT DETECTED NOT DETECTED Final  ? Pseudomonas aeruginosa NOT DETECTED NOT DETECTED Final  ? Stenotrophomonas maltophilia NOT DETECTED NOT DETECTED Final  ? Candida albicans NOT DETECTED NOT DETECTED Final  ? Candida auris NOT DETECTED NOT DETECTED Final  ? Candida glabrata NOT DETECTED NOT DETECTED Final  ? Candida krusei NOT DETECTED NOT DETECTED Final  ? Candida parapsilosis NOT DETECTED NOT DETECTED Final  ? Candida tropicalis NOT DETECTED NOT DETECTED Final  ? Cryptococcus neoformans/gattii NOT DETECTED NOT DETECTED Final  ?  Comment: Performed at [facility], [address], [place], [place] [address]  ?Blood Culture (routine x 2)     Status: Abnormal (Preliminary result)  ? Collection Time: 10/04/21  4:50 PM  ? Specimen: BLOOD  ?Result Value Ref Range Status  ? Specimen Description   Final  ?  BLOOD ?Performed at [facility], [address], [place], [place] [address] ?  ? Special Requests   Final  ?  NONE ?Performed at [facility], [address] [place], [place] [address] ?  ? Culture  Setup Time   Final  ?  GRAM POSITIVE COCCI Gram Stain Report Called to,Read Back By and Verified With:  V. BASS @ 1025 BY STEPHTR ?ANAEROBIC BOTTLE ONLY ?CRITICAL  VALUE NOTED.  VALUE IS CONSISTENT WITH PREVIOUSLY REPORTED AND CALLED VALUE. ?  ? Culture (A)  Final  ?  STREPTOCOCCUS PNEUMONIAE ?CULTURE REINCUBATED FOR BETTER GROWTH ?Performed at [facility], [place] [address], [place], [place] [address] ?  ? Report Status PENDING  Incomplete  ?Resp Panel by RT-PCR (Flu A&B, Covid) Nasopharyngeal Swab     Status: None  ? Collection Time: 10/04/21  5:11 PM  ? Specimen: Nasopharyngeal Swab; Nasopharyngeal(NP) swabs in vial transport medium  ?Result Value Ref Range Status  ? SARS Coronavirus 2 by RT PCR NEGATIVE NEGATIVE Final  ?  Comment: (NOTE) ?SARS-CoV-2 target nucleic acids are NOT DETECTED. ? ?The SARS-CoV-2 RNA is generally detectable in upper respiratory ?specimens during the acute phase of infection. The lowest ?concentration of SARS-CoV-2 viral copies this assay can detect is ?138 copies/mL. A negative result does not preclude SARS-Cov-2 ?infection and should not be used as the sole basis for treatment or ?other patient management decisions. A negative result may occur with  ?improper specimen collection/handling, submission of specimen other ?than nasopharyngeal swab, presence of viral mutation(s) within the ?areas targeted by this assay, and inadequate number of viral ?copies(<138 copies/mL). A  negative result must be combined with ?clinical observations, patient history, and epidemiological ?information. The expected result is Negative. ? ?Fact Sheet for Patients:  ?EntrepreneurPulse.com.au ? ?Fact Sheet for Healthcare Providers:  ?IncredibleEmployment.be ? ?This test is no t yet approved or cleared by the [place] FDA and  ?has been authorized for detection and/or diagnosis of SARS-CoV-2 by ?FDA under an Emergency Use Authorization (EUA). This EUA will remain  ?in effect (meaning this test can be used) for the duration of the ?COVID-19 declaration under Section 564(b)(1) of the Act, 21 ?U.S.C.section 360bbb-3(b)(1), unless  the authorization is terminated  ?or revoked sooner.  ? ? ?  ? Influenza A by PCR NEGATIVE NEGATIVE Final  ? Influenza B by PCR NEGATIVE NEGATIVE Final  ?  Comment: (NOTE) ?The Xpert Xpress SARS-CoV-2/FLU/RSV plus assa

## 2021-10-07 NOTE — Consult Note (Signed)
[facility] ? ? ?Patient name: Carrie Crawford Admit date: 10/04/2021  ?DOB: March 14, 1926 LOS: 3  ?MRN: [id number] Consult date: 10/07/2021  ?Referring provider: Dr. Manuella Ghazi, [facility]: Abnormal CT chest  ? ? ?History:  ?88 yo female brought to [facility] ER with fever, dyspnea, cough and weakness.  She is normally bedridden and uses 2 liters oxygen at home.  Started on antibiotics for community acquired pneumonia, and found to have Pneumococcal bacteremia and Pseudomonas in her urine.  CT chest showed  ? ?Past medical history:  ?HTN, COPD on home oxygen, Diastolic CHF, HLD, Dementia, GERD, COVID 19 PNA January 2023 ? ?Significant events:  ?3/25 Admit ?3/27 ID consulted ?3/28 cardiology consult for TEE >> no appropriate candidate for procedure ? ?Studies:  ?CT chest 10/06/21 >> spiculated LUL nodule 7 mm, scarring, consolidation RUL and RLL, GGO lingula and LLL, 6 mm nodule RUL, changes of cirrhosis ?Echo 10/06/21 >> EF 70 to 75%, mild LVH, grade 1 DD, mod elevation in PASP ? ?Micro:  ?Blood 3/25 >> Strep pneumoniae ?Urine 3/25 >> Pseudomonas ?Blood 3/26 >>  ? Lines:  ? ?  ?Antibiotics:  ?Zithromax 3/25 >> 3/26 ?Rocephin 3/25 >>  ? Consults:  ?ID ?Cardiology ?Palliative care ?  ? ?Interim history:  ?She is a poor historian and not able to provide accurate history.  She didn't know where she is at present and thought I was sent from her church.  She then told me her feet are ticklish and she doesn't have a husband. ? ?Vital signs:  ?BP (!) 147/67 (BP Location: Left Arm)   Pulse 78   Temp 98 ?F (36.7 ?C) (Oral)   Resp 20   Ht 5\' 4"  (1.626 m)   Wt 81.6 kg   SpO2 100%   BMI 30.88 kg/m?  ? Intake/output:  ?I/O last 3 completed shifts: ?In: 2083.1 [P.O.:1260; IV Piggyback:823.1] ?Out: 300 [Urine:300] ?  ?Physical exam:  ? ?General - alert ?Eyes - pupils reactive ?ENT - no sinus tenderness, no stridor ?Cardiac - regular rate/rhythm, no murmur ?Chest - equal breath sounds  b/l, no wheezing or rales ?Abdomen - soft, non tender, + bowel sounds ?Extremities - no cyanosis, clubbing, or edema ?Skin - no rashes ?Neuro - pleasantly confused, legs weak ? Best practice:  ? ?DVT - lovenox ?SUP - protonix ?Nutrition - heart healthy  ? ?Assessment/plan:  ? ?Spiculated Lt upper lung nodule. ?- this is concerning for primary lung malignancy ?- lesion is small and not likely causing any symptoms at present; it is very likely that she can live the rest of her days without this causing any symptoms ?- I believe a conservative approach is best given her overall functional status ?- if family wishes to consider aggressive therapy, then she could get set up for a PET scan as a outpatient once she is fully recovered from pneumonia and bacteremia and if lesion is PET positive, then see if radiation oncology would consider empiric radiation therapy.  I would not offer any therapies beyond this, and still think conservative approach is best option ? ?Community acquired multifocal Pneumococcal pneumonia (POA) with bacteremia. ?- continue ABx per primary team and ID ? ?COPD with chronic bronchitis. ?- continue inhalers ? ?Chronic respiratory failure with hypoxia. ?- goal SpO2 > 90% ? ?Hyponatremia. ?Hypokalemia. ?Anemia of chronic disease. ?GERD. ?Pressure injury, Rt and Lt buttock, stage 2, not present on admission. ?Moderate protein calorie malnutrition. ?Chronic diastolic CHF. ?Dementia. ?Bed ridden. ?- per primary team ? ?  D/w Dr. Manuella Ghazi. ? ?Resolved hospital problems:  ? ? ?Goals of care/Family discussions:  ?Code status: DNR ? ?Palliative care consulted.   ? ?Tried calling patient's son >> no answer. ? ?Labs:  ? ? ?  Latest Ref Rng & Units 10/07/2021  ?  5:58 AM 10/06/2021  ?  6:30 AM 10/05/2021  ? 11:05 AM  ?CMP  ?Glucose 70 - 99 mg/dL 105   109   149    ?BUN 8 - 23 mg/dL 15   17   17     ?Creatinine 0.44 - 1.00 mg/dL 0.67   0.70   0.74    ?Sodium 135 - 145 mmol/L 134   134   131    ?Potassium 3.5 - 5.1 mmol/L  2.9   3.0   2.9    ?Chloride 98 - 111 mmol/L 103   104   99    ?CO2 22 - 32 mmol/L 26   25   24     ?Calcium 8.9 - 10.3 mg/dL 7.7   7.5   7.6    ? ? ? ?  Latest Ref Rng & Units 10/07/2021  ?  5:58 AM 10/06/2021  ?  6:30 AM 10/05/2021  ?  6:20 AM  ?CBC  ?WBC 4.0 - 10.5 K/uL 8.6   9.7   18.2    ?Hemoglobin 12.0 - 15.0 g/dL 8.0   8.0   8.2    ?Hematocrit 36.0 - 46.0 % 25.4   25.3   24.6    ?Platelets 150 - 400 K/uL 328   312   298    ? ? ?ABG ?No results found for: PHART, PCO2ART, PO2ART, HCO3, TCO2, ACIDBASEDEF, O2SAT ? ?CBG (last 3)  ?No results for input(s): GLUCAP in the last 72 hours. ? ? ?Past surgical history:  ?She  has a past surgical history that includes Cholecystectomy; Thyroidectomy, partial (2004); Open reduction internal fixation (orif) distal radial fracture (Right, 03/16/2018); Fracture surgery; Cataract extraction w/ intraocular lens  implant, bilateral (Bilateral); and ORIF femur fracture (Right, 03/16/2018). ? Social history:  ?She  reports that she has never smoked. She has never used smokeless tobacco. She reports that she does not drink alcohol and does not use drugs. ?  ?Review of systems:  ?Unable to obtain. ? Family history:  ?Her family history includes Cancer in her daughter; Heart disease in her brother. ?  ? ?Medications:  ? ?No current facility-administered medications on file prior to encounter.  ? ?Current Outpatient Medications on File Prior to Encounter  ?Medication Sig  ? albuterol (PROVENTIL) (2.5 MG/3ML) 0.083% nebulizer solution Take 2.5 mg by nebulization every 4 (four) hours as needed for wheezing or shortness of breath.  ? ascorbic acid (VITAMIN C) 500 MG tablet Take 1 tablet (500 mg total) by mouth daily.  ? aspirin 81 MG tablet Take 81 mg by mouth daily.  ? azelastine (ASTELIN) 0.1 % nasal spray 2 sprays in each nostril at bedtime.  ? dextromethorphan-guaiFENesin (MUCINEX DM) 30-600 MG 12hr tablet Take 1 tablet by mouth 2 (two) times daily.  ? donepezil (ARICEPT) 5 MG tablet TAKE  ONE TABLET AT BEDTIME  ? Ferrous Sulfate (IRON PO) Take 1 tablet by mouth daily.  ? fluticasone (FLONASE) 50 MCG/ACT nasal spray 2 SPRAYS IN EACH NOSTRIL QD (Patient taking differently: Place 2 sprays into both nostrils daily.)  ? furosemide (LASIX) 40 MG tablet Take 0.5 tablets (20 mg total) by mouth daily.  ? losartan (COZAAR) 100 MG tablet TAKE ONE TABLET  AT BEDTIME (Patient taking differently: Take 100 mg by mouth daily.)  ? MULTIPLE VITAMIN PO Take 1 tablet by mouth daily. One a day  ? potassium chloride (MICRO-K) 10 MEQ CR capsule Take 2 capsules (20 mEq total) by mouth 2 (two) times daily. (NEEDS TO BE SEEN BEFORE NEXT REFILL)  ? Probiotic Product (PROBIOTIC PO) Take 1 tablet by mouth daily.  ? Simethicone (GAS RELIEF PO) Take 1 tablet by mouth at bedtime.  ? sodium chloride (OCEAN) 0.65 % SOLN nasal spray Place 1 spray into both nostrils as needed for congestion.  ? TRELEGY ELLIPTA 100-62.5-25 MCG/ACT AEPB INHALE 1 PUFF DAILY AS DIRECTED (Patient taking differently: Inhale 1 puff into the lungs at bedtime.)  ? vitamin B-12 (CYANOCOBALAMIN) 500 MCG tablet Take 1,000 mcg by mouth daily.  ? ipratropium (ATROVENT HFA) 17 MCG/ACT inhaler Inhale 2 puffs into the lungs every 8 (eight) hours. (Patient not taking: Reported on 10/04/2021)  ? nystatin cream (MYCOSTATIN) Apply 1 application topically 2 (two) times daily. (Patient not taking: Reported on 10/04/2021)  ? pantoprazole (PROTONIX) 40 MG tablet Take 1 tablet (40 mg total) by mouth daily. (Patient not taking: Reported on 10/04/2021)  ? zinc sulfate 220 (50 Zn) MG capsule TAKE ONE CAPSULE BY MOUTH DAILY (Patient not taking: Reported on 10/04/2021)  ?  ? ?Signature:  ?Chesley Mires, MD ?[place] ?Pager - [phone] - [phone] ?10/07/2021, 2:56 PM ? ? ? ? ? ? ? ?

## 2021-10-07 NOTE — TOC Progression Note (Addendum)
Transition of Care (TOC) - Progression Note  ? ? ?Patient Details  ?Name: Carrie Crawford ?MRN: [id number] ?Date of Birth: 06/08/1926 ? ?Transition of Care (TOC) CM/SW Contact  ?Shade Flood, LCSW ?Phone Number: ?10/07/2021, 10:50 AM ? ?Clinical Narrative:    ? ?TOC following. Per MD, pt not a candidate for TEE and will likely be treated as endocarditis with IV antibiotic therapy continuing at dc. ID final recommendations are pending at this time. If pt has a need for the IV antibiotics at dc, this can be done at home with family and [facility] or pt can go to SNF. TOC will follow up with family once ID plan finalized. ? ?MD states palliative consult will be ordered. Pt with possible new cancer diagnosis. Will follow up with family after Palliative consult to further assist with requested referrals/resources. ? ?Expected Discharge Plan: Home/Self Care ?Barriers to Discharge: Continued Medical Work up ? ?Expected Discharge Plan and Services ?Expected Discharge Plan: Home/Self Care ?  ?  ?Post Acute Care Choice: Resumption of Svcs/PTA Provider ?Living arrangements for the past 2 months: [place] ?                ?  ?  ?  ?  ?  ?  ?  ?  ?  ?  ? ? ?Social Determinants of Health (SDOH) Interventions ?  ? ?Readmission Risk Interventions ? ?  10/06/2021  ?  9:25 AM  ?Readmission Risk Prevention Plan  ?Transportation Screening Complete  ?[place] or Home Care Consult Complete  ?Social Work Consult for [place] Planning/Counseling Complete  ?Palliative Care Screening Not Applicable  ?Medication Review Press photographer) Complete  ? ? ?

## 2021-10-07 NOTE — Consult Note (Signed)
? ?                                                                                ?Consultation Note ?Date: 10/07/2021  ? ?Patient Name: Carrie Crawford  ?DOB: 06/22/1926  MRN: [id number]  Age / Sex: 88 y.o., female  ?PCP: [name], FNP ?Referring Physician: Heath Lark D, DO ? ?Reason for Consultation: Establishing goals of care ? ?HPI/Patient Profile: 88 y.o. female  with past medical history of dementia, bedbound, hypertension, COPD on supplemental oxygen at 3 LPM at baseline, diastolic CHF, hyperlipidemia  admitted on 10/04/2021 with community-acquired multifocal pneumonia with bacteremia, spiculated left upper lung nodule concerning for primary lung malignancy.  ? ?Clinical Assessment and Goals of Care: ?I have reviewed medical records including EPIC notes, labs and imaging, received report from RN, assessed the patient.  Carrie Crawford is lying quietly in bed.  She greets me, making and mostly keeping eye contact.  She appears acutely/chronically ill, obese, frail.  She is bedbound at baseline.  She is oriented to person and situation, but with known dementia.  I believe that she can make her basic needs known as she mentions that it is nearing breakfast.  When I ask if she would like something to eat she states that she would.  She is able to feed herself some fruit from her lunch tray.  There is no family at bedside at this time.  ? ?Call to son, Carrie Crawford to discuss diagnosis prognosis, [place], EOL wishes, disposition and options.  I introduced Palliative Medicine as specialized medical care for people living with serious illness. It focuses on providing relief from the symptoms and stress of a serious illness. The goal is to improve quality of life for both the patient and the family. ? ?We focused on their current illness. I ask what the doctors have told him.  Carrie Crawford is able to accurately share that Carrie Crawford has pneumonia and a blood infection.  We talked about the treatment plan.  He is also  able to tell me that she was scheduled for a CT of the chest.  He states shares that he has not heard the results.  Results and recommendations discussed with permission.  The natural disease trajectory and expectations at EOL were discussed.  At this point Carrie Crawford would go home with home health to assist with IV antibiotics is needed.  We talk about repeat blood cultures for further guidance. ? ?Advanced directives, concepts specific to code status, were considered and discussed.  Ms. Carrie Crawford is DNR ? ?Hospice and Palliative Care services outpatient were explained and offered.  We talk in detail about the benefits of "treat the treatable" hospice care.  I shared that just like I will get sick again, so will Carrie Crawford.  We talk about what this time looks like and feels like for her.  We talked about how to make choices for loved ones including 1) are we doing something for her or to her, can we change what is happening to) the person she was 5 years ago before becoming bedbound with memory loss, how would that person tell Carrie Crawford to care for Carrie Crawford now. ? ?  Discussed the importance of continued conversation with family and the medical providers regarding overall plan of care and treatment options, ensuring decisions are within the context of the patient?s values and GOCs.  Questions and concerns were addressed.  The family was encouraged to call with questions or concerns.  PMT will continue to support holistically. ? ?Conference with attending, bedside nursing staff, transition of care team related to patient condition, needs, goals of care, disposition. ? ? ?HCPOA ?NEXT OF KIN -Carrie Crawford tells me that he is the primary decision maker.  Carrie Crawford has another son, Bobby's brother, who is a retired Engineer, drilling, but lives remotely.  Carrie Crawford keeps his brother informed. ?  ? ?SUMMARY OF RECOMMENDATIONS   ?Continue to treat the treatable but no CPR or intubation ?Declines SNF, home with IV antibiotics if needed ?At this  point leaning toward conservative management of suspected lung cancer ?Considering hospice care. ? ? ?Code Status/Advance Care Planning: ?DNR ? ?Symptom Management:  ?Per hospitalist, no additional needs at this time. ? ?Palliative Prophylaxis:  ?Frequent Pain Assessment, Oral Care, and Turn Reposition ? ?Additional Recommendations (Limitations, Scope, Preferences): ?At this point continue to treat the treatable but no CPR or intubation ? ?Psycho-social/Spiritual:  ?Desire for further Chaplaincy support:no ?Additional Recommendations: Caregiving  Support/Resources and Education on Hospice ? ?Prognosis:  ?< 6 months, would not be surprising based on 2 hospital visits in the last 6 months, pneumonia with bacteremia, new diagnosis of suspected lung cancer, bedbound status and middle stages of memory loss. ? ?Discharge Planning:  Home with the benefits of home health for assistance with IV antibiotics.  Anticipate hospice care after home health finished.   ? ?  ? ?Primary Diagnoses: ?Present on Admission: ? Sepsis ([place]) ? Leukocytosis ? Hyponatremia ? Hypoalbuminemia due to protein-calorie malnutrition ([place]) ? Essential hypertension ? Chronic respiratory failure with hypoxia (HCC) ? Diastolic heart failure ([place]) ? ? ?I have reviewed the medical record, interviewed the patient and family, and examined the patient. The following aspects are pertinent. ? ?Past Medical History:  ?Diagnosis Date  ? Arthritis   ? "spine" (03/16/2018)  ? CHF (congestive heart failure) ([place])   ? Chronic bronchitis ([place])   ? Chronic kidney disease   ? Compression fracture of lumbar vertebra (HCC)   ? COPD (chronic obstructive pulmonary disease) ([place])   ? Dyslipidemia   ? Hypertension   ? Multiple allergies   ? Osteopenia   ? Rhinitis, chronic   ? ?Social History  ? ?Socioeconomic History  ? Marital status: Widowed  ?  Spouse name: Not on file  ? Number of children: Not on file  ? Years of education: Not on file  ? Highest education level: Not on  file  ?Occupational History  ? Not on file  ?Tobacco Use  ? Smoking status: Never  ? Smokeless tobacco: Never  ?Vaping Use  ? Vaping Use: Never used  ?Substance and Sexual Activity  ? Alcohol use: No  ?  Alcohol/week: 0.0 standard drinks  ? Drug use: No  ? Sexual activity: Not Currently  ?Other Topics Concern  ? Not on file  ?Social History Narrative  ? Not on file  ? ?Social Determinants of Health  ? ?Financial Resource Strain: Not on file  ?Food Insecurity: No Food Insecurity  ? Worried About Charity fundraiser in the Last Year: Never true  ? Ran Out of Food in the Last Year: Never true  ?Transportation Needs: No Transportation Needs  ? Lack of Transportation (Medical):  No  ? Lack of Transportation (Non-Medical): No  ?Physical Activity: Not on file  ?Stress: Not on file  ?Social Connections: Socially Isolated  ? Frequency of Communication with Friends and Family: Never  ? Frequency of Social Gatherings with Friends and Family: More than three times a week  ? Attends Religious Services: Never  ? Active Member of Clubs or Organizations: No  ? Attends Archivist Meetings: Never  ? Marital Status: Widowed  ? ?Family History  ?Problem Relation Age of Onset  ? Heart disease Brother   ? Cancer Daughter   ?     endometial/uterine cancer(nodule in right lung)  ? ?Scheduled Meds: ? aspirin  81 mg Oral Daily  ? dextromethorphan-guaiFENesin  1 tablet Oral BID  ? donepezil  5 mg Oral QHS  ? enoxaparin (LOVENOX) injection  40 mg Subcutaneous Q24H  ? feeding supplement  237 mL Oral BID BM  ? fluticasone furoate-vilanterol  1 puff Inhalation Daily  ? And  ? umeclidinium bromide  1 puff Inhalation Daily  ? losartan  100 mg Oral Daily  ? pantoprazole  40 mg Oral Daily  ? ?Continuous Infusions: ? cefTRIAXone (ROCEPHIN)  IV 2 g (10/06/21 1708)  ? potassium chloride 10 mEq (10/07/21 1330)  ? ?PRN Meds:.acetaminophen, albuterol, liver oil-zinc oxide ?Medications Prior to Admission:  ?Prior to Admission medications    ?Medication Sig Start Date End Date Taking? Authorizing Provider  ?albuterol (PROVENTIL) (2.5 MG/3ML) 0.083% nebulizer solution Take 2.5 mg by nebulization every 4 (four) hours as needed for wheezing or shortnes

## 2021-10-07 NOTE — Progress Notes (Signed)
? ? ?  88 year old with strep pneumoniae bacteremia.  Recent COVID.  Spiculated lung mass concerning for bronchogenic cancer.  Changes of liver cirrhosis on CT scan.  Advanced dementia, bedbound. ? ?We were asked to perform transesophageal echocardiogram.  After evaluating patient, current comorbidities, she is not a candidate for TEE. ? ?Pleasant, arousable.  States that she is from [place]. ? ?Transthoracic echocardiogram did not show any gross evidence of valvular vegetation.  No gross valvular abnormalities such as regurgitation. ? ?Please let us know if we can be of further assistance.  Antibiotic treatment per infectious disease. ? ?Candee Furbish, MD ? ?

## 2021-10-08 DIAGNOSIS — Z229 Carrier of infectious disease, unspecified: Secondary | ICD-10-CM

## 2021-10-08 LAB — BASIC METABOLIC PANEL
Anion gap: 3 — ABNORMAL LOW (ref 5–15)
BUN: 10 mg/dL (ref 8–23)
CO2: 27 mmol/L (ref 22–32)
Calcium: 7.8 mg/dL — ABNORMAL LOW (ref 8.9–10.3)
Chloride: 106 mmol/L (ref 98–111)
Creatinine, Ser: 0.64 mg/dL (ref 0.44–1.00)
GFR, Estimated: >60 mL/min (ref >60)
Glucose, Bld: 108 mg/dL — ABNORMAL HIGH (ref 70–99)
Potassium: 3.5 mmol/L (ref 3.5–5.1)
Sodium: 136 mmol/L (ref 135–145)

## 2021-10-08 LAB — STREP PNEUMONIAE URINARY ANTIGEN: Strep Pneumo Urinary Antigen: NEGATIVE

## 2021-10-08 LAB — MAGNESIUM: Magnesium: 2 mg/dL (ref 1.7–2.4)

## 2021-10-08 NOTE — Progress Notes (Signed)
?PROGRESS NOTE ? ? ? ?Carrie Crawford  RKY:[id number] DOB: [birth year] DOA: 10/04/2021 ?PCP: [name], FNP ? ? ?Brief Narrative:  ?Per HPI: ?Carrie Crawford is a 88 y.o. female with medical history significant of essential hypertension, COPD on supplemental oxygen at 3 LPM at baseline, diastolic CHF, hyperlipidemia who presents to the emergency department via EMS due to worsening shortness of breath.  Patient was unable to provide a history possibly due to underlying dementia, history was obtained from ED physician and ED medical record.  Per report, patient presents with fever, generalized weakness, cough and chest congestion which has been ongoing for about 3 days.  She was reported to have had a sick contact.  She was hard of hearing.  Patient is bedbound at baseline and requires a [employer] fast streamer for transfer. ? ?10/05/21: Patient was admitted with sepsis, present on admission related to multifocal pneumonia and has been started on IV Rocephin and azithromycin.  She remains somewhat confused this AM.  She is noted to have gram-positive cocci in blood cultures.  2D echocardiogram has been ordered along with repeat cultures at this time.  Plan for ID evaluation by 3/27. ? ?3/27: Patient overall doing well with leukocytosis downtrending.  Repeat blood cultures with no growth noted thus far.  Azithromycin to be discontinued and remain on Rocephin only.  2D echocardiogram pending.  Appreciate ID recommendations for CT chest which will be ordered today.  Consider thoracentesis after further evaluation. ? ?3/28: Patient appears to be doing well.  Patient is not a candidate for TEE per cardiology.  She continues to remain on Rocephin with further ID recommendations pending.  CT chest with concerns for primary bronchogenic carcinoma.  Pulmonary consulted for further evaluation.  Palliative also consulted for goals of care. ? ?3/29: Patient overall doing well.  Plans are for conservative management and discharged home by  a.m. if blood cultures continue to remain negative.  Discussed care with ID.  ? ? ?Assessment & Plan: ?  ?Principal Problem: ?  Sepsis ([place]) ?Active Problems: ?  COPD (chronic obstructive pulmonary disease) ([place]) ?  Essential hypertension ?  Dementia without behavioral disturbance ([place]) ?  Diastolic heart failure ([place]) ?  Hyponatremia ?  Multifocal pneumonia ?  Leukocytosis ?  Hypoalbuminemia due to protein-calorie malnutrition ([place]) ?  Chronic respiratory failure with hypoxia (HCC) ?  Pulmonary nodule ?  Dehydration ?  GERD (gastroesophageal reflux disease) ?  Bacteremia ?  Pressure injury of skin ?  Colonization status ? ?Assessment and Plan: ? ? ? ? Sepsis ([place]) ?Present on admission ?With now noted strep pneumonia bacteremia ?Patient met sepsis criteria due to being tachypneic and WBC was elevated with a left shift. ?Chest x-ray was indicative of possible multifocal infection ?Patient was empirically started on IV ceftriaxone and azithromycin, azithromycin discontinued 3/27 ?-Repeat blood cultures with no growth thus far ?-TTE with no signs of endocarditis, TEE cannot be for performed as patient is not a good candidate ?-Planning for discharge in a.m. with oral amoxicillin x2 weeks for conservative management if blood cultures continue to remain negative for total 72 hours. ?  ?  ?GERD (gastroesophageal reflux disease) ?Continue Protonix ?  ?Dehydration ?Hold further IV hydration ?  ?Pulmonary nodule ?CT chest demonstrating findings of primary bronchogenic carcinoma ?Case discussed with son, palliative, and PCCM.  Opting for conservative management at this time. ?  ?  ?Chronic respiratory failure with hypoxia (HCC) ?Continue supplemental oxygen per home regimen to maintain O2 sats > 94% ?  ?  Hypoalbuminemia due to protein-calorie malnutrition ([place]) ?Albumin 2.4, this is possibly secondary to moderate protein calorie malnutrition ?Protein supplement will be provided ?  ?Leukocytosis ?Resolved ?  ?Multifocal  pneumonia ?Chest x-ray was indicative of possible multifocal infection ?Continue management as described in sepsis ?  ?Diastolic heart failure ([place]) ?LVEF done in the 2019 showed LVEF of 60 to 20% with LV diastolic parameters showing G1 DD ?Patient does not appear fluid overloaded; Continue aspirin and losartan  ?Continue following daily weights and heart healthy diet. ?  ?Dementia without behavioral disturbance ([place]) ?Continue Aricept ?Continue constant reorientation and supportive care ?  ?Essential hypertension ?Continue losartan, temporarily hold Lasix given hypokalemia ?  ?COPD (chronic obstructive pulmonary disease) ([place]) ?Continue albuterol and Breo Ellipta ?  ?  ?  ?DVT prophylaxis: Lovenox ?Code Status: DNR ?Family Communication: Discussed with son on phone 3/27 ?Disposition Plan:  ?Status is: Inpatient ?Remains inpatient appropriate because: Need for IV antibiotics. ?  ?Skin Assessment: ?  ?I have examined the patient?s skin and I agree with the wound assessment as performed by the wound care RN as outlined below: ?  ?       ?Pressure Injury 10/05/21 Buttocks Right;Left Stage 2 -  Partial thickness loss of dermis presenting as a shallow open injury with a red, pink wound bed without slough. total area blanchable 5 cm X 4 cm, 2- 0.5cmx0.5cm stage 2 on each buttock (Active)  ?10/05/21 0203  ?Location: Buttocks  ?Location Orientation: Right;Left  ?Staging: Stage 2 -  Partial thickness loss of dermis presenting as a shallow open injury with a red, pink wound bed without slough.  ?Wound Description (Comments): total area blanchable 5 cm X 4 cm, 2- 0.5cmx0.5cm stage 2 on each buttock  ?Present on Admission:   ?Dressing Type None 10/05/21 0150  ?  ?  ?Consultants:  ?ID ?Cardiology ?PCCM ?Palliative ?  ?Procedures:  ?See below ?  ?Antimicrobials:  ?Anti-infectives (From admission, onward)  ? ? Start     Dose/Rate Route Frequency Ordered Stop  ? 10/04/21 1745  cefTRIAXone (ROCEPHIN) 2 g in sodium chloride 0.9 % 100  mL IVPB       ? 2 g ?200 mL/hr over 30 Minutes Intravenous Every 24 hours 10/04/21 1730 10/09/21 1744  ? 10/04/21 1745  azithromycin (ZITHROMAX) 500 mg in sodium chloride 0.9 % 250 mL IVPB  Status:  Discontinued       ? 500 mg ?250 mL/hr over 60 Minutes Intravenous Every 24 hours 10/04/21 1730 10/06/21 0815  ? ?  ? ?Subjective: ?Patient seen and evaluated today with no new acute complaints or concerns. No acute concerns or events noted overnight. ? ?Objective: ?Vitals:  ? 10/07/21 2046 10/08/21 0522 10/08/21 0815 10/08/21 1300  ?BP: (!) 147/68 (!) 161/83  (!) 147/63  ?Pulse: 68 78  84  ?Resp: (!) _0 ?Temp: 97.9 ?F (36.6 ?C) 97.9 ?F (36.6 ?C)  97.6 ?F (36.4 ?C)  ?TempSrc: Oral Oral    ?SpO2: 100% 99% 99% 98%  ?Weight:  82.1 kg    ?Height:      ? ? ?Intake/Output Summary (Last 24 hours) at 10/08/2021 1512 ?Last data filed at 10/08/2021 0400 ?Gross per 24 hour  ?Intake 340 ml  ?Output 1000 ml  ?Net -660 ml  ? ?Filed Weights  ? 10/06/21 0500 10/07/21 0525 10/08/21 0522  ?Weight: 82 kg 81.6 kg 82.1 kg  ? ? ?Examination: ? ?General exam: Appears calm and comfortable, hard of hearing ?Respiratory system: Clear to  auscultation. Respiratory effort normal.  On nasal cannula ?Cardiovascular system: S1 & S2 heard, RRR.  ?Gastrointestinal system: Abdomen is soft ?Central nervous system: Alert and awake ?Extremities: No edema ?Skin: No significant lesions noted ?Psychiatry: Flat affect. ? ? ? ?Data Reviewed: I have personally reviewed following labs and imaging studies ? ?CBC: ?Recent Labs  ?Lab 10/04/21 ?1627 10/05/21 ?0620 10/06/21 ?0630 10/07/21 ?7353  ?WBC 22.1* 18.2* 9.7 8.6  ?NEUTROABS 18.3*  --   --   --   ?HGB 9.1* 8.2* 8.0* 8.0*  ?HCT 28.0* 24.6* 25.3* 25.4*  ?MCV 97.2 99.2 98.1 98.8  ?PLT 302 298 312 328  ? ?Basic Metabolic Panel: ?Recent Labs  ?Lab 10/05/21 ?0620 10/05/21 ?1105 10/06/21 ?0630 10/07/21 ?2992 10/08/21 ?1204  ?NA 132* 131* 134* 134* 136  ?K 3.3* 2.9* 3.0* 2.9* 3.5  ?CL 100 99 104 103 106  ?CO2 _0 ?GLUCOSE 114* 149* 109* 105* 108*  ?BUN _1 ?CREATININE 0.67 0.74 0.70 0.67 0.64  ?CALCIUM 7.6* 7.6* 7.5* 7.7* 7.8*  ?MG 2.0  --  2.1 2.0 2.0  ?PHOS 2.6  --   --   --   --   ? ?GFR:

## 2021-10-08 NOTE — Progress Notes (Signed)
Palliative: ?Carrie Crawford is sitting up in the bed in her room feeding herself breakfast.  She will briefly make but not keep eye contact.  She appears chronically ill, obese.  She has known dementia, but is oriented to self and somewhat to situation.  She denies issues at this time. ? ?Call to son, healthcare surrogate, [name].  Left somewhat detailed voicemail message. ? ?Conference with attending, bedside nursing staff, transition of care team related to patient condition, needs, goals of care, disposition. ? ?Plan:    At this point continue to treat the treatable but no CPR or intubation.  Anticipate home with by mouth antibiotics for 2 weeks.  Would clearly benefit from outpatient palliative services due to suspected new diagnosis of lung cancer. ? ?35 minutes  ?Quinn Axe, NP ?Palliative medicine team ?Team phone [phone] ?Greater than 50% of this time was spent counseling and coordinating care related to the above assessment and plan. ? ?

## 2021-10-08 NOTE — Progress Notes (Signed)
Family has opted for conservative management regarding possible lung cancer.  Palliative care consulted. ? ?PCCM will sign off.  Please call if additional assistance needed while she is in hospital. ? ?Chesley Mires, MD ?[facility] ?Pager - [phone] - [phone] ?10/08/2021, 1:54 PM ? ?

## 2021-10-08 NOTE — Progress Notes (Addendum)
? ?[facility] Infectious Diseases Follow Up Note ? ?Patient Identification: ?Patient Name: Carrie Crawford MRN: [id number] [place] Date: 10/04/2021  3:52 PM ?Age: 88 y.o.Today's Date: 10/08/2021 ? ? ?Reason for Visit: bacteremia ? ?Principal Problem: ?  Sepsis ([place]) ?Active Problems: ?  COPD (chronic obstructive pulmonary disease) ([place]) ?  Essential hypertension ?  Dementia without behavioral disturbance ([place]) ?  Diastolic heart failure ([place]) ?  Hyponatremia ?  Multifocal pneumonia ?  Leukocytosis ?  Hypoalbuminemia due to protein-calorie malnutrition ([place]) ?  Chronic respiratory failure with hypoxia (HCC) ?  Pulmonary nodule ?  Dehydration ?  GERD (gastroesophageal reflux disease) ?  Bacteremia ?  Pressure injury of skin ? ?Antibiotics:  ?Ceftriaxone 3/25-c ?Azithromycin 3/25-3/26 ?  ?Lines/Hardware: ORIF rt distal radius ? ?Interval Events: remains afebrile, Cardiology/Pulmonology and Palliative care consulted  ? ?Assessment ?# # Strep pneumoniae bacteremia likely 2/2 below ?- Repeat blood cx 3/26 no growth in 2 dats. TTE with no concerns for vegetation and endocarditis per Dr Gillian Shields. Poor TEE candidate per cardiology ?-No concerns for meningitis and encephalitis per Hospitalist  ? ?# Multifocal PNA/small Rt pleural effusion and loculated pleural effusion ( stable ) ? ?# Pseudomonas in urine cultures - only 20,000 colonies and [place] unremarkable, not significant for UTI ? ?# Possible primary bronchogenic ca ?- Pulm and palliative care following, plan for conservative management, also considering hospice ? ?# Recent COVID PNA in Jan 2023 s/p molnupiravir ?# Possible Liver cirrhosis - hepatitis B and C serology negative ?  ?Recommendations  ?Continue ceftriaxone as is while in the hospital.  ?Fu repeat blood cultures to make sure they are negative in 72 hrs. If so, plan for oral amoxicillin on discharge ?Duration 2 weeks from date of negative blood cultures  ?D/w primary   ?Following ? ?Rest of the management as per the primary team. ?Thank you for the consult. Please page with pertinent questions or concerns. ? ?______________________________________________________________________ ?Subjective ?Patient not seen and examined  ?Chart reviewed only ? ?Vitals ?BP (!) 161/83 (BP Location: Left Arm)   Pulse 78   Temp 97.9 ?F (36.6 ?C) (Oral)   Resp 19   Ht 5\' 4"  (1.626 m)   Wt 82.1 kg   SpO2 99%   BMI 31.07 kg/m?  ? ? ?Pertinent Microbiology ?Results for orders placed or performed during the hospital encounter of 10/04/21  ?Blood Culture (routine x 2)     Status: Abnormal  ? Collection Time: 10/04/21  4:27 PM  ? Specimen: BLOOD RIGHT WRIST  ?Result Value Ref Range Status  ? Specimen Description   Final  ?  BLOOD RIGHT WRIST BOTTLES DRAWN AEROBIC AND ANAEROBIC ?Performed at [facility], [address], [place], [place] [address] ?  ? Special Requests   Final  ?  Blood Culture adequate volume ?Performed at [facility], [address], [place], [place] [address] ?  ? Culture  Setup Time   Final  ?  GRAM POSITIVE COCCI Gram Stain Report Called to,Read Back By and Verified With: V. BASS @ 1025 10/05/21 BY STEPHTR ?AEROBIC BOTTLE ONLY ?CRITICAL RESULT CALLED TO, READ BACK BY AND VERIFIED WITH: RN V.BASS ON [id number] AT 1608 BY E.PARRISH ?Performed at [facility], [place] [address] [place], [place] [address] ?  ? Culture STREPTOCOCCUS PNEUMONIAE (A)  Final  ? Report Status 10/07/2021 FINAL  Final  ? Organism ID, Bacteria STREPTOCOCCUS PNEUMONIAE  Final  ?    Susceptibility  ? Streptococcus pneumoniae - MIC*  ?  ERYTHROMYCIN >=8  RESISTANT Resistant   ?  LEVOFLOXACIN 0.5 SENSITIVE Sensitive   ?  VANCOMYCIN 0.5 SENSITIVE Sensitive   ?  PENICILLIN (meningitis) 0.25 RESISTANT Resistant   ?  PENO - penicillin 0.25    ?  PENICILLIN (non-meningitis) 0.25 SENSITIVE Sensitive   ?  PENICILLIN (oral) 0.25 INTERMEDIATE Intermediate   ?  CEFTRIAXONE (non-meningitis) 0.25 SENSITIVE Sensitive   ?   CEFTRIAXONE (meningitis) 0.25 SENSITIVE Sensitive   ?  * STREPTOCOCCUS PNEUMONIAE  ?Blood Culture ID Panel (Reflexed)     Status: Abnormal  ? Collection Time: 10/04/21  4:27 PM  ?Result Value Ref Range Status  ? Enterococcus faecalis NOT DETECTED NOT DETECTED Final  ? Enterococcus Faecium NOT DETECTED NOT DETECTED Final  ? Listeria monocytogenes NOT DETECTED NOT DETECTED Final  ? Staphylococcus species NOT DETECTED NOT DETECTED Final  ? Staphylococcus aureus (BCID) NOT DETECTED NOT DETECTED Final  ? Staphylococcus epidermidis NOT DETECTED NOT DETECTED Final  ? Staphylococcus lugdunensis NOT DETECTED NOT DETECTED Final  ? Streptococcus species DETECTED (A) NOT DETECTED Final  ?  Comment: CRITICAL RESULT CALLED TO, READ BACK BY AND VERIFIED WITH: ?RN V.BASS ON [id number] AT 1608 BY E.PARRISH ?  ? Streptococcus agalactiae NOT DETECTED NOT DETECTED Final  ? Streptococcus pneumoniae DETECTED (A) NOT DETECTED Final  ?  Comment: CRITICAL RESULT CALLED TO, READ BACK BY AND VERIFIED WITH: ?RN V.BASS ON [id number] AT 1608 BY E.PARRISH ?  ? Streptococcus pyogenes NOT DETECTED NOT DETECTED Final  ? A.calcoaceticus-baumannii NOT DETECTED NOT DETECTED Final  ? Bacteroides fragilis NOT DETECTED NOT DETECTED Final  ? Enterobacterales NOT DETECTED NOT DETECTED Final  ? Enterobacter cloacae complex NOT DETECTED NOT DETECTED Final  ? Escherichia coli NOT DETECTED NOT DETECTED Final  ? Klebsiella aerogenes NOT DETECTED NOT DETECTED Final  ? Klebsiella oxytoca NOT DETECTED NOT DETECTED Final  ? Klebsiella pneumoniae NOT DETECTED NOT DETECTED Final  ? Proteus species NOT DETECTED NOT DETECTED Final  ? Salmonella species NOT DETECTED NOT DETECTED Final  ? Serratia marcescens NOT DETECTED NOT DETECTED Final  ? Haemophilus influenzae NOT DETECTED NOT DETECTED Final  ? Neisseria meningitidis NOT DETECTED NOT DETECTED Final  ? Pseudomonas aeruginosa NOT DETECTED NOT DETECTED Final  ? Stenotrophomonas maltophilia NOT DETECTED NOT DETECTED Final  ?  Candida albicans NOT DETECTED NOT DETECTED Final  ? Candida auris NOT DETECTED NOT DETECTED Final  ? Candida glabrata NOT DETECTED NOT DETECTED Final  ? Candida krusei NOT DETECTED NOT DETECTED Final  ? Candida parapsilosis NOT DETECTED NOT DETECTED Final  ? Candida tropicalis NOT DETECTED NOT DETECTED Final  ? Cryptococcus neoformans/gattii NOT DETECTED NOT DETECTED Final  ?  Comment: Performed at [facility], [address], [place], [place] [address]  ?Blood Culture (routine x 2)     Status: Abnormal  ? Collection Time: 10/04/21  4:50 PM  ? Specimen: BLOOD  ?Result Value Ref Range Status  ? Specimen Description   Final  ?  BLOOD ?Performed at [facility], [address], [place], [place] [address] ?  ? Special Requests   Final  ?  NONE ?Performed at [facility], [address], [place], [place] [address] ?  ? Culture  Setup Time   Final  ?  GRAM POSITIVE COCCI Gram Stain Report Called to,Read Back By and Verified With:  V. BASS @ 1025 BY STEPHTR ?ANAEROBIC BOTTLE ONLY ?CRITICAL VALUE NOTED.  VALUE IS CONSISTENT WITH PREVIOUSLY REPORTED AND CALLED VALUE. ?  ? Culture (A)  Final  ?  STREPTOCOCCUS PNEUMONIAE ?SUSCEPTIBILITIES PERFORMED ON PREVIOUS CULTURE WITHIN THE LAST 5 DAYS. ?Performed at [facility], [place] [address], [place], [place] [address] ?  ? Report Status 10/07/2021 FINAL  Final  ?Resp Panel by RT-PCR (Flu A&B, Covid) Nasopharyngeal Swab     Status: None  ? Collection Time: 10/04/21  5:11 PM  ? Specimen: Nasopharyngeal Swab; Nasopharyngeal(NP) swabs in vial transport medium  ?Result Value Ref Range Status  ? SARS Coronavirus 2 by RT PCR NEGATIVE NEGATIVE Final  ?  Comment: (NOTE) ?SARS-CoV-2 target nucleic acids are NOT DETECTED. ? ?The SARS-CoV-2 RNA is generally detectable in upper respiratory ?specimens during the acute phase of infection. The lowest ?concentration of SARS-CoV-2 viral copies this assay can detect is ?138 copies/mL. A negative result does not preclude SARS-Cov-2 ?infection  and should not be used as the sole basis for treatment or ?other patient management decisions. A negative result may occur with  ?improper specimen collection/handling, submission of specimen other ?than nas

## 2021-10-09 DIAGNOSIS — Z229 Carrier of infectious disease, unspecified: Secondary | ICD-10-CM

## 2021-10-09 LAB — CBC
HCT: 27.7 % — ABNORMAL LOW (ref 36.0–46.0)
Hemoglobin: 8.7 g/dL — ABNORMAL LOW (ref 12.0–15.0)
MCH: 31.3 pg (ref 26.0–34.0)
MCHC: 31.4 g/dL (ref 30.0–36.0)
MCV: 99.6 fL (ref 80.0–100.0)
Platelets: 402 10*3/uL — ABNORMAL HIGH (ref 150–400)
RBC: 2.78 MIL/uL — ABNORMAL LOW (ref 3.87–5.11)
RDW: 13.2 % (ref 11.5–15.5)
WBC: 9.2 10*3/uL (ref 4.0–10.5)
nRBC: 0 % (ref 0.0–0.2)

## 2021-10-09 LAB — BASIC METABOLIC PANEL
Anion gap: 7 (ref 5–15)
BUN: 10 mg/dL (ref 8–23)
CO2: 28 mmol/L (ref 22–32)
Calcium: 8 mg/dL — ABNORMAL LOW (ref 8.9–10.3)
Chloride: 101 mmol/L (ref 98–111)
Creatinine, Ser: 0.6 mg/dL (ref 0.44–1.00)
GFR, Estimated: >60 mL/min (ref >60)
Glucose, Bld: 107 mg/dL — ABNORMAL HIGH (ref 70–99)
Potassium: 2.9 mmol/L — ABNORMAL LOW (ref 3.5–5.1)
Sodium: 136 mmol/L (ref 135–145)

## 2021-10-09 LAB — MAGNESIUM: Magnesium: 2 mg/dL (ref 1.7–2.4)

## 2021-10-09 MED ORDER — POTASSIUM CHLORIDE ER 10 MEQ PO CPCR: 40.0000 meq | ORAL_CAPSULE | Freq: Two times a day (BID) | ORAL | 0 refills | Status: DC

## 2021-10-09 MED ORDER — POTASSIUM CHLORIDE 10 MEQ/100ML IV SOLN
10.0000 meq | INTRAVENOUS | Status: AC
  Administered 2021-10-09 (×3): 10 meq via INTRAVENOUS
  Filled 2021-10-09 (×3): qty 100

## 2021-10-09 MED ORDER — AMOXICILLIN 250 MG PO CAPS
1000.0000 mg | ORAL_CAPSULE | Freq: Three times a day (TID) | ORAL | Status: DC
  Administered 2021-10-09: 1000 mg via ORAL
  Filled 2021-10-09: qty 4

## 2021-10-09 MED ORDER — AMOXICILLIN 500 MG PO CAPS: 1000.0000 mg | ORAL_CAPSULE | Freq: Three times a day (TID) | ORAL | 0 refills | Status: AC

## 2021-10-09 MED ORDER — ENSURE ENLIVE PO LIQD: 237.0000 mL | Freq: Two times a day (BID) | ORAL | 12 refills | Status: DC

## 2021-10-09 NOTE — Progress Notes (Signed)
Palliative: ?[name] is resting quietly in bed.  She appears acutely/chronically ill, obese but frail.  She is resting comfortably, and does not respond to gentle voice or touch.  I let her sleep.  There is no family at bedside at this time. ? ?Call to son/healthcare surrogate, Carrie Crawford.  We talked about anticipated discharge to home today.  We talk about 10 more days by mouth antibiotics for total of 2 weeks treatment.  Carrie Crawford states that they have all needed equipment.  He has in-home paid caregivers from 9A-2P and then from 5:30-10 at night.  He requested EMS transport home. ?We talked about the benefits of "treat the treatable" hospice care.  Carrie Crawford states that he feels this would be beneficial for [name] and readily accepts.  Provider choice offered.  He chooses [facility]. ? ?Conference with attending, bedside nursing staff, transition of care team related to patient condition, needs, goals of care, disposition. ?DNR/goldenrod form completed and placed on chart. ? ?Plan:   At this point continue to treat the treatable but no CPR or intubation.  2 weeks of oral antibiotics at home.  Home with the benefits of "treat the treatable" hospice care with [facility]. ? ?50 minutes  ?Quinn Axe, NP ?Palliative medicine team ?Team phone [phone] ?Greater than 50% of this time was spent counseling and coordinating care related to the above assessment and plan. ?

## 2021-10-09 NOTE — Progress Notes (Signed)
Pt discharged home and transported by EMS. Son called and made aware of discharge summary and changes to meds. Son voiced understanding. No distress noted at time of departure. ?

## 2021-10-09 NOTE — Progress Notes (Addendum)
? ?[facility] Infectious Diseases Follow Up Note ? ?Patient Identification: ?Patient Name: Carrie Crawford MRN: [id number] [place] Date: 10/04/2021  3:52 PM ?Age: 88 y.o.Today's Date: 10/09/2021 ? ? ?Reason for Visit: bacteremia ? ?Principal Problem: ?  Sepsis ([place]) ?Active Problems: ?  COPD (chronic obstructive pulmonary disease) ([place]) ?  Essential hypertension ?  Dementia without behavioral disturbance ([place]) ?  Diastolic heart failure ([place]) ?  Hyponatremia ?  Multifocal pneumonia ?  Leukocytosis ?  Hypoalbuminemia due to protein-calorie malnutrition ([place]) ?  Chronic respiratory failure with hypoxia ([facility]) ?  Pulmonary nodule ?  Dehydration ?  GERD (gastroesophageal reflux disease) ?  Bacteremia ?  Pressure injury of skin ?  Colonization status ? ?Antibiotics:  ?Ceftriaxone 3/25-c ?Azithromycin 3/25-3/26 ?  ?Lines/Hardware: ORIF rt distal radius ? ?Interval Events: remains afebrile, hypokalemia.  ? ?Assessment ?# # Strep pneumoniae bacteremia likely 2/2 below ?- Repeat blood cx 3/26 no growth in 3 days. TTE with no concerns for vegetation and endocarditis per Dr Gillian Shields. Poor TEE candidate per cardiology ?-No concerns for meningitis and encephalitis per Hospitalist  ? ?# RT sided PNA, concerns for aspiration ? ?# Pseudomonas in urine cultures - only 20,000 colonies and [place] unremarkable, not significant for UTI ? ?# Possible primary bronchogenic ca ?- Pulm and palliative care following, plan for conservative management, also considering hospice ? ?# Recent COVID PNA in Jan 2023 s/p molnupiravir ?# Possible Liver cirrhosis - hepatitis B and C serology negative ?  ?Recommendations  ?Ok to switch to high dose amoxicillin when ready to be discharged.  ?Duration 2 weeks from date of last negative blood cultures ( IV and PO) ?D/w primary ?ID will sign off. Please call with questions ? ?Rest of the management as per the primary team. ?Thank you for the consult. Please page with  pertinent questions or concerns. ? ?______________________________________________________________________ ?Subjective ?Patient not seen and examined  ?Chart reviewed only ? ?Vitals ?BP (!) 146/56 (BP Location: Left Arm)   Pulse 75   Temp 97.7 ?F (36.5 ?C)   Resp 18   Ht 5\' 4"  (1.626 m)   Wt 82 kg   SpO2 93%   BMI 31.03 kg/m?  ? ? ?Pertinent Microbiology ?Results for orders placed or performed during the hospital encounter of 10/04/21  ?Blood Culture (routine x 2)     Status: Abnormal  ? Collection Time: 10/04/21  4:27 PM  ? Specimen: BLOOD RIGHT WRIST  ?Result Value Ref Range Status  ? Specimen Description   Final  ?  BLOOD RIGHT WRIST BOTTLES DRAWN AEROBIC AND ANAEROBIC ?Performed at [facility], [address], [place], [place] [address] ?  ? Special Requests   Final  ?  Blood Culture adequate volume ?Performed at [facility], [address] [place], [place] [address] ?  ? Culture  Setup Time   Final  ?  GRAM POSITIVE COCCI Gram Stain Report Called to,Read Back By and Verified With: V. BASS @ 1025 10/05/21 BY STEPHTR ?AEROBIC BOTTLE ONLY ?CRITICAL RESULT CALLED TO, READ BACK BY AND VERIFIED WITH: RN V.BASS ON [id number] AT 1608 BY E.PARRISH ?Performed at [facility], [place] [address], [place], [place] [address] ?  ? Culture STREPTOCOCCUS PNEUMONIAE (A)  Final  ? Report Status 10/07/2021 FINAL  Final  ? Organism ID, Bacteria STREPTOCOCCUS PNEUMONIAE  Final  ?    Susceptibility  ? Streptococcus pneumoniae - MIC*  ?  ERYTHROMYCIN >=8 RESISTANT Resistant   ?  LEVOFLOXACIN 0.5 SENSITIVE Sensitive   ?  VANCOMYCIN 0.5 SENSITIVE Sensitive   ?  PENICILLIN (meningitis) 0.25 RESISTANT Resistant   ?  PENO - penicillin 0.25    ?  PENICILLIN (non-meningitis) 0.25 SENSITIVE Sensitive   ?  PENICILLIN (oral) 0.25 INTERMEDIATE Intermediate   ?  CEFTRIAXONE (non-meningitis) 0.25 SENSITIVE Sensitive   ?  CEFTRIAXONE (meningitis) 0.25 SENSITIVE Sensitive   ?  * STREPTOCOCCUS PNEUMONIAE  ?Blood Culture ID Panel (Reflexed)      Status: Abnormal  ? Collection Time: 10/04/21  4:27 PM  ?Result Value Ref Range Status  ? Enterococcus faecalis NOT DETECTED NOT DETECTED Final  ? Enterococcus Faecium NOT DETECTED NOT DETECTED Final  ? Listeria monocytogenes NOT DETECTED NOT DETECTED Final  ? Staphylococcus species NOT DETECTED NOT DETECTED Final  ? Staphylococcus aureus (BCID) NOT DETECTED NOT DETECTED Final  ? Staphylococcus epidermidis NOT DETECTED NOT DETECTED Final  ? Staphylococcus lugdunensis NOT DETECTED NOT DETECTED Final  ? Streptococcus species DETECTED (A) NOT DETECTED Final  ?  Comment: CRITICAL RESULT CALLED TO, READ BACK BY AND VERIFIED WITH: ?RN V.BASS ON [id number] AT 1608 BY E.PARRISH ?  ? Streptococcus agalactiae NOT DETECTED NOT DETECTED Final  ? Streptococcus pneumoniae DETECTED (A) NOT DETECTED Final  ?  Comment: CRITICAL RESULT CALLED TO, READ BACK BY AND VERIFIED WITH: ?RN V.BASS ON [id number] AT 1608 BY E.PARRISH ?  ? Streptococcus pyogenes NOT DETECTED NOT DETECTED Final  ? A.calcoaceticus-baumannii NOT DETECTED NOT DETECTED Final  ? Bacteroides fragilis NOT DETECTED NOT DETECTED Final  ? Enterobacterales NOT DETECTED NOT DETECTED Final  ? Enterobacter cloacae complex NOT DETECTED NOT DETECTED Final  ? Escherichia coli NOT DETECTED NOT DETECTED Final  ? Klebsiella aerogenes NOT DETECTED NOT DETECTED Final  ? Klebsiella oxytoca NOT DETECTED NOT DETECTED Final  ? Klebsiella pneumoniae NOT DETECTED NOT DETECTED Final  ? Proteus species NOT DETECTED NOT DETECTED Final  ? Salmonella species NOT DETECTED NOT DETECTED Final  ? Serratia marcescens NOT DETECTED NOT DETECTED Final  ? Haemophilus influenzae NOT DETECTED NOT DETECTED Final  ? Neisseria meningitidis NOT DETECTED NOT DETECTED Final  ? Pseudomonas aeruginosa NOT DETECTED NOT DETECTED Final  ? Stenotrophomonas maltophilia NOT DETECTED NOT DETECTED Final  ? Candida albicans NOT DETECTED NOT DETECTED Final  ? Candida auris NOT DETECTED NOT DETECTED Final  ? Candida glabrata  NOT DETECTED NOT DETECTED Final  ? Candida krusei NOT DETECTED NOT DETECTED Final  ? Candida parapsilosis NOT DETECTED NOT DETECTED Final  ? Candida tropicalis NOT DETECTED NOT DETECTED Final  ? Cryptococcus neoformans/gattii NOT DETECTED NOT DETECTED Final  ?  Comment: Performed at [facility], [address], [place], [place] [address]  ?Blood Culture (routine x 2)     Status: Abnormal  ? Collection Time: 10/04/21  4:50 PM  ? Specimen: BLOOD  ?Result Value Ref Range Status  ? Specimen Description   Final  ?  BLOOD ?Performed at [facility], [address], [place], [place] [address] ?  ? Special Requests   Final  ?  NONE ?Performed at [facility], [address], [place], [place] [address] ?  ? Culture  Setup Time   Final  ?  GRAM POSITIVE COCCI Gram Stain Report Called to,Read Back By and Verified With:  V. BASS @ 1025 BY STEPHTR ?ANAEROBIC BOTTLE ONLY ?CRITICAL VALUE NOTED.  VALUE IS CONSISTENT WITH PREVIOUSLY REPORTED AND CALLED VALUE. ?  ? Culture (A)  Final  ?  STREPTOCOCCUS PNEUMONIAE ?SUSCEPTIBILITIES PERFORMED ON PREVIOUS CULTURE WITHIN THE LAST 5 DAYS. ?Performed  at [facility], [place] [address] [place], [place] [address] ?  ? Report Status 10/07/2021 FINAL  Final  ?Resp Panel by RT-PCR (Flu A&B, Covid) Nasopharyngeal Swab     Status: None  ? Collection Time: 10/04/21  5:11 PM  ? Specimen: Nasopharyngeal Swab; Nasopharyngeal(NP) swabs in vial transport medium  ?Result Value Ref Range Status  ? SARS Coronavirus 2 by RT PCR NEGATIVE NEGATIVE Final  ?  Comment: (NOTE) ?SARS-CoV-2 target nucleic acids are NOT DETECTED. ? ?The SARS-CoV-2 RNA is generally detectable in upper respiratory ?specimens during the acute phase of infection. The lowest ?concentration of SARS-CoV-2 viral copies this assay can detect is ?138 copies/mL. A negative result does not preclude SARS-Cov-2 ?infection and should not be used as the sole basis for treatment or ?other patient management decisions. A negative result may  occur with  ?improper specimen collection/handling, submission of specimen other ?than nasopharyngeal swab, presence of viral mutation(s) within the ?areas targeted by this assay, and inadequate number of vi

## 2021-10-09 NOTE — TOC Transition Note (Signed)
Transition of Care (TOC) - CM/SW Discharge Note ? ? ?Patient Details  ?Name: Carrie Crawford ?MRN: [id number] ?Date of Birth: 03-25-1926 ? ?Transition of Care (TOC) CM/SW Contact:  ?Shade Flood, LCSW ?Phone Number: ?10/09/2021, 11:42 AM ? ? ?Clinical Narrative:    ? ?Pt stable for dc home on oral antibiotics per MD. Palliative APNP spoke with pt/family today and they are requesting referral for [facility] services at home upon dc. Referral made to Kika at hospice to refer. She states that they will reach out to family today and likely be able to admit pt to hospice tomorrow AM.  ? ?Pt will need EMS transport home. TOC will arrange. Updated RN.  ? ?There are no other TOC needs for dc. ? ?Final next level of care: [place] ?Barriers to Discharge: Barriers Resolved ? ? ?Patient Goals and CMS Choice ?Patient states their goals for this hospitalization and ongoing recovery are:: Return home ?CMS Medicare.gov Compare Post Acute Care list provided to:: Patient Represenative (must comment) ?Choice offered to / list presented to : Adult Children ? ?Discharge Placement ?  ?           ?  ?  ?  ?  ? ?Discharge Plan and Services ?  ?  ?Post Acute Care Choice: Hospice          ?  ?  ?  ?  ?  ?  ?  ?  ?  ?  ? ?Social Determinants of Health (SDOH) Interventions ?  ? ? ?Readmission Risk Interventions ? ?  10/06/2021  ?  9:25 AM  ?Readmission Risk Prevention Plan  ?Transportation Screening Complete  ?[place] or Home Care Consult Complete  ?Social Work Consult for [place] Planning/Counseling Complete  ?Palliative Care Screening Not Applicable  ?Medication Review Press photographer) Complete  ? ? ? ? ? ?

## 2021-10-09 NOTE — Discharge Summary (Signed)
Physician Discharge Summary  ?Kayliana Codd Ramroop LPF:[id number] DOB: 09-09-1925 DOA: 10/04/2021 ? ?PCP: [name], FNP ? ?Admit date: 10/04/2021 ? ?Discharge date: 10/09/2021 ? ?Admitted From:Home ? ?Disposition:  Home ? ?Recommendations for Outpatient Follow-up:  ?Follow up with PCP in 1-2 weeks, follow-up BMP in 3-5 days ?Continues to have persistent hypokalemia and will be prescribed potassium supplementation, will give IV Kcl before DC ?Please follow-up with outpatient palliative services ?Remain on amoxicillin for 10 more days as prescribed to complete total 2-week course of treatment for strep pneumonia bacteremia ?Continue other home medications as prior ? ?Home Health: None ? ?Equipment/Devices: Has home 3 L nasal cannula ? ?Discharge Condition:Stable ? ?CODE STATUS: DNR ? ?Diet recommendation: Heart Healthy ? ?Brief/Interim Summary: ?Per HPI: ?Carrie Crawford is a 88 y.o. female with medical history significant of essential hypertension, COPD on supplemental oxygen at 3 LPM at baseline, diastolic CHF, hyperlipidemia who presents to the emergency department via EMS due to worsening shortness of breath.  Patient was unable to provide a history possibly due to underlying dementia, history was obtained from ED physician and ED medical record.  Per report, patient presents with fever, generalized weakness, cough and chest congestion which has been ongoing for about 3 days.  She was reported to have had a sick contact.  She was hard of hearing.  Patient is bedbound at baseline and requires a [employer] fast streamer for transfer. ? ?-Patient was admitted with sepsis present on admission related to multifocal pneumonia and was eventually noted to have strep pneumo bacteremia.  Transthoracic echocardiogram without any signs of endocarditis noted.  Cardiology was consulted in regards to TEE, however she was a poor candidate for this.  CT of her chest was performed demonstrating signs of primary bronchogenic carcinoma for which  pulmonology was consulted.  Given her advanced age and dementia, it was recommended that no further aggressive interventions be done and this was confirmed with discussions with family members involving palliative care.  She does not have any signs of meningitis or encephalitis and has had negative blood cultures over the last 72 hours on repeat.  At this time, ID recommends discharging with amoxicillin for 10 more days to complete a total 2-week course of treatment.  No other acute events noted throughout the course of this admission and she is stable for discharge. ? ?Discharge Diagnoses:  ?Principal Problem: ?  Sepsis ([place]) ?Active Problems: ?  COPD (chronic obstructive pulmonary disease) ([place]) ?  Essential hypertension ?  Dementia without behavioral disturbance ([place]) ?  Diastolic heart failure ([place]) ?  Hyponatremia ?  Multifocal pneumonia ?  Leukocytosis ?  Hypoalbuminemia due to protein-calorie malnutrition ([place]) ?  Chronic respiratory failure with hypoxia (H[facility]) ?  Pulmonary nodule ?  Dehydration ?  GERD (gastroesophageal reflux disease) ?  Bacteremia ?  Pressure injury of skin ?  Colonization status ? ?Principal discharge diagnosis: Sepsis secondary to multifocal pneumonia with strep pneumonia bacteremia-present on admission, ruled in.  Pulmonary nodule concerning for primary bronchogenic carcinoma.  History of dementia with moderate protein calorie malnutrition. ? ?Discharge Instructions ? ?Discharge Instructions   ? ? Diet - low sodium heart healthy   Complete by: As directed ?  ? Increase activity slowly   Complete by: As directed ?  ? No wound care   Complete by: As directed ?  ? ?  ? ?Allergies as of 10/09/2021   ?No Known Allergies ?  ? ?  ?Medication List  ?  ? ?STOP taking these medications   ? ?  fluticasone 50 MCG/ACT nasal spray ?Commonly known as: FLONASE ?  ?ipratropium 17 MCG/ACT inhaler ?Commonly known as: ATROVENT HFA ?  ?zinc sulfate 220 (50 Zn) MG capsule ?  ? ?  ? ?TAKE these medications    ? ?albuterol (2.5 MG/3ML) 0.083% nebulizer solution ?Commonly known as: PROVENTIL ?Take 2.5 mg by nebulization every 4 (four) hours as needed for wheezing or shortness of breath. ?  ?amoxicillin 500 MG capsule ?Commonly known as: AMOXIL ?Take 2 capsules (1,000 mg total) by mouth every 8 (eight) hours for 10 days. ?  ?ascorbic acid 500 MG tablet ?Commonly known as: VITAMIN C ?Take 1 tablet (500 mg total) by mouth daily. ?  ?aspirin 81 MG tablet ?Take 81 mg by mouth daily. ?  ?azelastine 0.1 % nasal spray ?Commonly known as: Astelin ?2 sprays in each nostril at bedtime. ?  ?dextromethorphan-guaiFENesin 30-600 MG 12hr tablet ?Commonly known as: [place] DM ?Take 1 tablet by mouth 2 (two) times daily. ?  ?donepezil 5 MG tablet ?Commonly known as: ARICEPT ?TAKE ONE TABLET AT BEDTIME ?  ?feeding supplement Liqd ?Take 237 mLs by mouth 2 (two) times daily between meals. ?  ?furosemide 40 MG tablet ?Commonly known as: LASIX ?Take 0.5 tablets (20 mg total) by mouth daily. ?  ?GAS RELIEF PO ?Take 1 tablet by mouth at bedtime. ?  ?IRON PO ?Take 1 tablet by mouth daily. ?  ?losartan 100 MG tablet ?Commonly known as: COZAAR ?TAKE ONE TABLET AT BEDTIME ?What changed: when to take this ?  ?MULTIPLE VITAMIN PO ?Take 1 tablet by mouth daily. One a day ?  ?nystatin cream ?Commonly known as: MYCOSTATIN ?Apply 1 application topically 2 (two) times daily. ?  ?pantoprazole 40 MG tablet ?Commonly known as: PROTONIX ?Take 1 tablet (40 mg total) by mouth daily. ?  ?potassium chloride 10 MEQ CR capsule ?Commonly known as: MICRO-K ?Take 4 capsules (40 mEq total) by mouth 2 (two) times daily. (NEEDS TO BE SEEN BEFORE NEXT REFILL) ?What changed: how much to take ?  ?PROBIOTIC PO ?Take 1 tablet by mouth daily. ?  ?sodium chloride 0.65 % Soln nasal spray ?Commonly known as: OCEAN ?Place 1 spray into both nostrils as needed for congestion. ?  ?Trelegy Ellipta 100-62.5-25 MCG/ACT Aepb ?Generic drug: Fluticasone-Umeclidin-Vilant ?INHALE 1 PUFF DAILY  AS DIRECTED ?What changed: See the new instructions. ?  ?vitamin B-12 500 MCG tablet ?Commonly known as: CYANOCOBALAMIN ?Take 1,000 mcg by mouth daily. ?  ? ?  ? ? Follow-up Information   ? ? [name], FNP. Schedule an appointment as soon as possible for a visit in 1 week(s).   ?Specialty: Family Medicine ?Contact information: ?[address] ?[place] [address] ?[phone] ? ? ?  ?  ? ?  ?  ? ?  ? ?No Known Allergies ? ?Consultations: ?ID ?Pulmonology ?Cardiology ?Palliative care ? ? ?Procedures/Studies: ?DG Chest 1 View ? ?Result Date: 09/20/2021 ?CLINICAL DATA:  Recent pneumonia. EXAM: CHEST  1 VIEW COMPARISON:  Chest radiograph dated 07/13/2021. FINDINGS: There are bibasilar atelectasis/scarring. Probable small loculated left pleural effusion. No pneumothorax. Stable cardiomegaly. Atherosclerotic calcification of the aorta. No acute osseous pathology. IMPRESSION: Bibasilar atelectasis/scarring. Probable small loculated left pleural effusion. Electronically Signed   By: Anner Crete M.D.   On: 09/20/2021 02:52  ? ?CT CHEST WO CONTRAST ? ?Result Date: 10/06/2021 ?CLINICAL DATA:  Pleural effusion, malignancy suspected. Cough, congestion and shortness of breath. * Tracking Code: BO * EXAM: CT CHEST WITHOUT CONTRAST TECHNIQUE: Multidetector CT imaging of  the chest was performed following the standard protocol without IV contrast. RADIATION DOSE REDUCTION: This exam was performed according to the departmental dose-optimization program which includes automated exposure control, adjustment of the mA and/or kV according to patient size and/or use of iterative reconstruction technique. COMPARISON:  Chest radiograph 10/04/2021. FINDINGS: Cardiovascular: Atherosclerotic calcification of the aorta, aortic valve and coronary arteries. Pulmonic trunk and heart are enlarged. Small amount of pericardial fluid is likely physiologic. Mediastinum/Nodes: Small to borderline enlarged mediastinal lymph nodes, measuring up  to 10 mm in the low right paratracheal station. Hilar regions are difficult to evaluate without IV contrast. No axillary adenopathy. Esophagus is grossly unremarkable. Lungs/Pleura: Image quality is degrade

## 2021-10-09 NOTE — Care Management Important Message (Signed)
Important Message ? ?Patient Details  ?Name: Carrie Crawford ?MRN: [id number] ?Date of Birth: 1926-04-30 ? ? ?Medicare Important Message Given:  Yes ? ? ? ? ?Tommy Medal ?10/09/2021, 10:22 AM ?

## 2021-10-10 ENCOUNTER — Telehealth: Payer: Self-pay | Admitting: Family Medicine

## 2021-10-10 LAB — CULTURE, BLOOD (ROUTINE X 2)
Culture: NO GROWTH
Culture: NO GROWTH
Special Requests: ADEQUATE
Special Requests: ADEQUATE

## 2021-10-10 NOTE — Telephone Encounter (Signed)
Pt called and aware of appt time/date for 4/6 with [facility].  ?

## 2021-10-10 NOTE — Telephone Encounter (Signed)
Patient needs hospital follow up. Please call back.  ?

## 2021-10-11 ENCOUNTER — Other Ambulatory Visit: Payer: Self-pay | Admitting: Family Medicine

## 2021-10-16 ENCOUNTER — Encounter: Payer: Self-pay | Admitting: Family Medicine

## 2021-10-16 ENCOUNTER — Ambulatory Visit (INDEPENDENT_AMBULATORY_CARE_PROVIDER_SITE_OTHER): Admitting: Family Medicine

## 2021-10-16 VITALS — BP 111/60 | HR 88 | Temp 98.2°F

## 2021-10-16 DIAGNOSIS — I5032 Chronic diastolic (congestive) heart failure: Secondary | ICD-10-CM | POA: Diagnosis not present

## 2021-10-16 DIAGNOSIS — Z09 Encounter for follow-up examination after completed treatment for conditions other than malignant neoplasm: Secondary | ICD-10-CM

## 2021-10-16 DIAGNOSIS — R911 Solitary pulmonary nodule: Secondary | ICD-10-CM

## 2021-10-16 DIAGNOSIS — E46 Unspecified protein-calorie malnutrition: Secondary | ICD-10-CM

## 2021-10-16 DIAGNOSIS — I10 Essential (primary) hypertension: Secondary | ICD-10-CM | POA: Diagnosis not present

## 2021-10-16 DIAGNOSIS — J189 Pneumonia, unspecified organism: Secondary | ICD-10-CM

## 2021-10-16 DIAGNOSIS — J449 Chronic obstructive pulmonary disease, unspecified: Secondary | ICD-10-CM

## 2021-10-16 DIAGNOSIS — F0390 Unspecified dementia without behavioral disturbance: Secondary | ICD-10-CM

## 2021-10-16 DIAGNOSIS — E8809 Other disorders of plasma-protein metabolism, not elsewhere classified: Secondary | ICD-10-CM

## 2021-10-16 DIAGNOSIS — E876 Hypokalemia: Secondary | ICD-10-CM

## 2021-10-16 NOTE — Progress Notes (Signed)
? ?Established Patient Office Visit ? ?Subjective:  ?Patient ID: Carrie Crawford, female    DOB: 1925-11-30  Age: 88 y.o. MRN: [id number] ? ?CC:  ?Chief Complaint  ?Patient presents with  ? Hospitalization Follow-up  ? ? ?HPI ?Carrie Crawford presents for hospital discharge follow up. Here with son today whom she lives with. Carrie Crawford was admitted on 10/04/21 for sepsis due to multifocal pneumonia. A CT of her chest noted a pulmonary nodule that was concerning for primary bronchogenic carcinoma. Pulmonology was consulted and recommended against aggressive interventions due to age and dementia. Family also agrees with this plan. She was discharged on 11/09/21 on amoxicillin to completed for treatment of pneumonia. Hospice was consulted and a hospice nurse will be coming twice a week. Son reports that hospice nurse should be coming back tomorrow. She remain on 3L [place], which is her baseline. Son reports that she has not been running a fever and that Carrie Crawford has not seemed short of breath. She is completing amoxicillin as prescribed. Her potassium was low in the hospital and she was started on a potassium supplement.  ? ?Past Medical History:  ?Diagnosis Date  ? Arthritis   ? "spine" (03/16/2018)  ? CHF (congestive heart failure) ([place])   ? Chronic bronchitis ([place])   ? Chronic kidney disease   ? Compression fracture of lumbar vertebra ([facility])   ? COPD (chronic obstructive pulmonary disease) ([place])   ? Dyslipidemia   ? Hypertension   ? Multiple allergies   ? Osteopenia   ? Rhinitis, chronic   ? ? ?Past Surgical History:  ?Procedure Laterality Date  ? CATARACT EXTRACTION W/ INTRAOCULAR LENS  IMPLANT, BILATERAL Bilateral   ? CHOLECYSTECTOMY    ? FRACTURE SURGERY    ? OPEN REDUCTION INTERNAL FIXATION (ORIF) DISTAL RADIAL FRACTURE Right 03/16/2018  ? ORIF FEMUR FRACTURE Right 03/16/2018  ? Procedure: OPEN REDUCTION INTERNAL FIXATION (ORIF) DISTAL FEMUR FRACTURE;  Surgeon: Shona Needles, MD;  Location: [facility];  Service: Orthopedics;   Laterality: Right;  ? THYROIDECTOMY, PARTIAL  2004  ? [facility]  ? ? ?Family History  ?Problem Relation Age of Onset  ? Heart disease Brother   ? Cancer Daughter   ?     endometial/uterine cancer(nodule in right lung)  ? ? ?Social History  ? ?Socioeconomic History  ? Marital status: Widowed  ?  Spouse name: Not on file  ? Number of children: Not on file  ? Years of education: Not on file  ? Highest education level: Not on file  ?Occupational History  ? Not on file  ?Tobacco Use  ? Smoking status: Never  ? Smokeless tobacco: Never  ?Vaping Use  ? Vaping Use: Never used  ?Substance and Sexual Activity  ? Alcohol use: No  ?  Alcohol/week: 0.0 standard drinks  ? Drug use: No  ? Sexual activity: Not Currently  ?Other Topics Concern  ? Not on file  ?Social History Narrative  ? Not on file  ? ?Social Determinants of Health  ? ?Financial Resource Strain: Not on file  ?Food Insecurity: No Food Insecurity  ? Worried About Charity fundraiser in the Last Year: Never true  ? Ran Out of Food in the Last Year: Never true  ?Transportation Needs: No Transportation Needs  ? Lack of Transportation (Medical): No  ? Lack of Transportation (Non-Medical): No  ?Physical Activity: Not on file  ?Stress: Not on file  ?Social Connections: Socially Isolated  ? Frequency of Communication with Friends and  Family: Never  ? Frequency of Social Gatherings with Friends and Family: More than three times a week  ? Attends Religious Services: Never  ? Active Member of Clubs or Organizations: No  ? Attends Archivist Meetings: Never  ? Marital Status: Widowed  ?Intimate Partner Violence: Not on file  ? ? ?Outpatient Medications Prior to Visit  ?Medication Sig Dispense Refill  ? albuterol (PROVENTIL) (2.5 MG/3ML) 0.083% nebulizer solution Take 2.5 mg by nebulization every 4 (four) hours as needed for wheezing or shortness of breath.    ? amoxicillin (AMOXIL) 500 MG capsule Take 2 capsules (1,000 mg total) by mouth every 8 (eight) hours for 10 days.  60 capsule 0  ? ascorbic acid (VITAMIN C) 500 MG tablet Take 1 tablet (500 mg total) by mouth daily. 30 tablet 1  ? aspirin 81 MG tablet Take 81 mg by mouth daily.    ? azelastine (ASTELIN) 0.1 % nasal spray 2 sprays in each nostril at bedtime. 90 mL 3  ? dextromethorphan-guaiFENesin (MUCINEX DM) 30-600 MG 12hr tablet Take 1 tablet by mouth 2 (two) times daily. 20 tablet 0  ? donepezil (ARICEPT) 5 MG tablet TAKE ONE TABLET AT BEDTIME 30 tablet 3  ? feeding supplement (ENSURE ENLIVE / ENSURE PLUS) LIQD Take 237 mLs by mouth 2 (two) times daily between meals. 237 mL 12  ? Ferrous Sulfate (IRON PO) Take 1 tablet by mouth daily.    ? furosemide (LASIX) 40 MG tablet Take 0.5 tablets (20 mg total) by mouth daily.    ? losartan (COZAAR) 100 MG tablet TAKE ONE TABLET AT BEDTIME (Patient taking differently: Take 100 mg by mouth daily.) 30 tablet 5  ? MULTIPLE VITAMIN PO Take 1 tablet by mouth daily. One a day    ? nystatin cream (MYCOSTATIN) Apply 1 application topically 2 (two) times daily. 30 g 2  ? pantoprazole (PROTONIX) 40 MG tablet TAKE ONE TABLET BY MOUTH DAILY 30 tablet 0  ? potassium chloride (MICRO-K) 10 MEQ CR capsule Take 4 capsules (40 mEq total) by mouth 2 (two) times daily. (NEEDS TO BE SEEN BEFORE NEXT REFILL) 240 capsule 0  ? Probiotic Product (PROBIOTIC PO) Take 1 tablet by mouth daily.    ? Simethicone (GAS RELIEF PO) Take 1 tablet by mouth at bedtime.    ? sodium chloride (OCEAN) 0.65 % SOLN nasal spray Place 1 spray into both nostrils as needed for congestion.    ? TRELEGY ELLIPTA 100-62.5-25 MCG/ACT AEPB INHALE 1 PUFF DAILY AS DIRECTED (Patient taking differently: Inhale 1 puff into the lungs at bedtime.) 60 each 5  ? vitamin B-12 (CYANOCOBALAMIN) 500 MCG tablet Take 1,000 mcg by mouth daily.    ? ?No facility-administered medications prior to visit.  ? ? ?No Known Allergies ? ?ROS ?Review of Systems ?As per HPI.  ?  ?Objective:  ?  ?Physical Exam ?Vitals and nursing note reviewed.  ?Constitutional:   ?    General: She is not in acute distress. ?   Appearance: She is not ill-appearing, toxic-appearing or diaphoretic.  ?HENT:  ?   Head: Normocephalic and atraumatic.  ?   Nose: Nose normal.  ?Cardiovascular:  ?   Rate and Rhythm: Normal rate and regular rhythm.  ?   Heart sounds: Normal heart sounds. No murmur heard. ?Pulmonary:  ?   Effort: Pulmonary effort is normal. No respiratory distress.  ?   Breath sounds: No wheezing or rhonchi.  ?Chest:  ?   Chest wall: No tenderness.  ?  Abdominal:  ?   General: Bowel sounds are normal. There is distension (base).  ?   Palpations: Abdomen is soft.  ?   Tenderness: There is no abdominal tenderness. There is no guarding or rebound.  ?Skin: ?   General: Skin is warm and dry.  ?Neurological:  ?   Mental Status: She is alert. Mental status is at baseline.  ?   Gait: Gait abnormal (arrived in wheelchair).  ? ? ?BP 111/60   Pulse 88   Temp 98.2 ?F (36.8 ?C) (Temporal)   SpO2 97%  ?Wt Readings from Last 3 Encounters:  ?10/09/21 180 lb 12.4 oz (82 kg)  ?07/13/21 189 lb 9.5 oz (86 kg)  ?01/31/21 189 lb 9.6 oz (86 kg)  ? ? ? ?Health Maintenance Due  ?Topic Date Due  ? Zoster Vaccines- Shingrix (1 of 2) Never done  ? ? ?There are no preventive care reminders to display for this patient. ? ?Lab Results  ?Component Value Date  ? TSH 1.762 04/08/2018  ? ?Lab Results  ?Component Value Date  ? WBC 9.2 10/09/2021  ? HGB 8.7 (L) 10/09/2021  ? HCT 27.7 (L) 10/09/2021  ? MCV 99.6 10/09/2021  ? PLT 402 (H) 10/09/2021  ? ?Lab Results  ?Component Value Date  ? NA 136 10/09/2021  ? K 2.9 (L) 10/09/2021  ? CO2 28 10/09/2021  ? GLUCOSE 107 (H) 10/09/2021  ? BUN 10 10/09/2021  ? CREATININE 0.60 10/09/2021  ? BILITOT 0.3 10/05/2021  ? ALKPHOS 59 10/05/2021  ? AST 12 (L) 10/05/2021  ? ALT 10 10/05/2021  ? PROT 6.2 (L) 10/05/2021  ? ALBUMIN 2.0 (L) 10/05/2021  ? CALCIUM 8.0 (L) 10/09/2021  ? ANIONGAP 7 10/09/2021  ? EGFR 74 08/01/2021  ? GFR 76.15 04/29/2017  ? ?Lab Results  ?Component Value Date  ? CHOL  184 10/27/2016  ? ?Lab Results  ?Component Value Date  ? HDL 56.20 10/27/2016  ? ?Lab Results  ?Component Value Date  ? LDLCALC 105 (H) 10/27/2016  ? ?Lab Results  ?Component Value Date  ? TRIG 112.0 04/17/

## 2021-10-16 NOTE — Patient Instructions (Signed)
Hypokalemia Hypokalemia means that the amount of potassium in the blood is lower than normal. Potassium is a chemical (electrolyte) that helps regulate the amount of fluid in the body. It also stimulates muscle tightening (contraction) and helps nerves work properly. Normally, most of the body's potassium is inside cells, and only a very small amount is in the blood. Because the amount in the blood is so small, minor changes to potassium levels in the blood can be life-threatening. What are the causes? This condition may be caused by: Antibiotic medicine. Diarrhea or vomiting. Taking too much of a medicine that helps you have a bowel movement (laxative) can cause diarrhea and lead to hypokalemia. Chronic kidney disease (CKD). Medicines that help the body get rid of excess fluid (diuretics). Eating disorders, such as bulimia. Low magnesium levels in the body. Sweating a lot. What are the signs or symptoms? Symptoms of this condition include: Weakness. Constipation. Fatigue. Muscle cramps. Mental confusion. Skipped heartbeats or irregular heartbeat (palpitations). Tingling or numbness. How is this diagnosed? This condition is diagnosed with a blood test. How is this treated? This condition may be treated by: Taking potassium supplements by mouth. Adjusting the medicines that you take. Eating more foods that contain a lot of potassium. If your potassium level is very low, you may need to get potassium through an IV and be monitored in the hospital. Follow these instructions at home:  Take over-the-counter and prescription medicines only as told by your health care provider. This includes vitamins and supplements. Eat a healthy diet. A healthy diet includes fresh fruits and vegetables, whole grains, healthy fats, and lean proteins. If instructed, eat more foods that contain a lot of potassium. This includes: Nuts, such as peanuts and pistachios. Seeds, such as sunflower seeds and  pumpkin seeds. Peas, lentils, and lima beans. Whole grain and bran cereals and breads. Fresh fruits and vegetables, such as apricots, avocado, bananas, cantaloupe, kiwi, oranges, tomatoes, asparagus, and potatoes. Orange juice. Tomato juice. Red meats. Yogurt. Keep all follow-up visits as told by your health care provider. This is important. Contact a health care provider if you: Have weakness that gets worse. Feel your heart pounding or racing. Vomit. Have diarrhea. Have diabetes (diabetes mellitus) and you have trouble keeping your blood sugar (glucose) in your target range. Get help right away if you: Have chest pain. Have shortness of breath. Have vomiting or diarrhea that lasts for more than 2 days. Faint. Summary Hypokalemia means that the amount of potassium in the blood is lower than normal. This condition is diagnosed with a blood test. Hypokalemia may be treated by taking potassium supplements, adjusting the medicines that you take, or eating more foods that are high in potassium. If your potassium level is very low, you may need to get potassium through an IV and be monitored in the hospital. This information is not intended to replace advice given to you by your health care provider. Make sure you discuss any questions you have with your health care provider. Document Revised: 02/08/2018 Document Reviewed: 02/09/2018 Elsevier Patient Education  2022 Elsevier Inc.  

## 2021-10-20 ENCOUNTER — Other Ambulatory Visit: Payer: Medicare Other

## 2021-10-20 ENCOUNTER — Other Ambulatory Visit: Payer: Self-pay

## 2021-10-20 DIAGNOSIS — I10 Essential (primary) hypertension: Secondary | ICD-10-CM

## 2021-10-20 LAB — BMP8+EGFR
BUN/Creatinine Ratio: 18 (ref 12–28)
BUN: 17 mg/dL (ref 10–36)
CO2: 27 mmol/L (ref 20–29)
Calcium: 9 mg/dL (ref 8.7–10.3)
Chloride: 94 mmol/L — ABNORMAL LOW (ref 96–106)
Creatinine, Ser: 0.92 mg/dL (ref 0.57–1.00)
Glucose: 121 mg/dL — ABNORMAL HIGH (ref 70–99)
Potassium: 5.9 mmol/L (ref 3.5–5.2)
Sodium: 131 mmol/L — ABNORMAL LOW (ref 134–144)
eGFR: 58 mL/min/{1.73_m2} — ABNORMAL LOW (ref >59)

## 2021-10-21 ENCOUNTER — Inpatient Hospital Stay: Payer: Medicare Other | Admitting: Infectious Diseases

## 2021-10-21 LAB — CBC WITH DIFFERENTIAL/PLATELET
Basophils Absolute: 0.1 10*3/uL (ref 0.0–0.2)
Basos: 1 %
EOS (ABSOLUTE): 0.1 10*3/uL (ref 0.0–0.4)
Eos: 2 %
Hematocrit: 27.3 % — ABNORMAL LOW (ref 34.0–46.6)
Hemoglobin: 9.5 g/dL — ABNORMAL LOW (ref 11.1–15.9)
Immature Grans (Abs): 0 10*3/uL (ref 0.0–0.1)
Immature Granulocytes: 0 %
Lymphocytes Absolute: 2.4 10*3/uL (ref 0.7–3.1)
Lymphs: 44 %
MCH: 32 pg (ref 26.6–33.0)
MCHC: 34.8 g/dL (ref 31.5–35.7)
MCV: 92 fL (ref 79–97)
Monocytes Absolute: 0.4 10*3/uL (ref 0.1–0.9)
Monocytes: 8 %
Neutrophils Absolute: 2.5 10*3/uL (ref 1.4–7.0)
Neutrophils: 45 %
Platelets: 335 10*3/uL (ref 150–450)
RBC: 2.97 x10E6/uL — ABNORMAL LOW (ref 3.77–5.28)
RDW: 11.8 % (ref 11.7–15.4)
WBC: 5.5 10*3/uL (ref 3.4–10.8)

## 2021-10-21 LAB — BMP8+EGFR
BUN/Creatinine Ratio: 20 (ref 12–28)
BUN: 18 mg/dL (ref 10–36)
CO2: 28 mmol/L (ref 20–29)
Calcium: 8.6 mg/dL — ABNORMAL LOW (ref 8.7–10.3)
Chloride: 91 mmol/L — ABNORMAL LOW (ref 96–106)
Creatinine, Ser: 0.91 mg/dL (ref 0.57–1.00)
Glucose: 127 mg/dL — ABNORMAL HIGH (ref 70–99)
Potassium: 4.4 mmol/L (ref 3.5–5.2)
Sodium: 132 mmol/L — ABNORMAL LOW (ref 134–144)
eGFR: 59 mL/min/{1.73_m2} — ABNORMAL LOW (ref >59)

## 2021-11-06 ENCOUNTER — Other Ambulatory Visit: Payer: Medicare Other

## 2021-11-06 DIAGNOSIS — I10 Essential (primary) hypertension: Secondary | ICD-10-CM

## 2021-11-07 ENCOUNTER — Telehealth: Payer: Self-pay | Admitting: Family Medicine

## 2021-11-07 LAB — BMP8+EGFR
BUN/Creatinine Ratio: 18 (ref 12–28)
BUN: 14 mg/dL (ref 10–36)
CO2: 26 mmol/L (ref 20–29)
Calcium: 8.6 mg/dL — ABNORMAL LOW (ref 8.7–10.3)
Chloride: 93 mmol/L — ABNORMAL LOW (ref 96–106)
Creatinine, Ser: 0.76 mg/dL (ref 0.57–1.00)
Glucose: 123 mg/dL — ABNORMAL HIGH (ref 70–99)
Potassium: 3.5 mmol/L (ref 3.5–5.2)
Sodium: 133 mmol/L — ABNORMAL LOW (ref 134–144)
eGFR: 73 mL/min/{1.73_m2} (ref >59)

## 2021-11-07 NOTE — Telephone Encounter (Signed)
Hospice calling to talk to nurse about results for patient. Please call back.  ?

## 2021-11-07 NOTE — Telephone Encounter (Signed)
TTC, no answer and VM not set up. ?

## 2021-11-18 ENCOUNTER — Ambulatory Visit: Payer: Self-pay | Admitting: *Deleted

## 2021-11-18 NOTE — Chronic Care Management (AMB) (Signed)
? ?  11/18/2021 ? ?Carrie Crawford ?[birth year] ?[id number] ? ?Case closed to Chronic Care Management services in primary care home due to lack of patient engagement within the past 6 months. Re-enrollment in CCM services can be initiated in the future by PCP or patient as needed. ? ?Follow Up Plan: No further follow-up required at this time. Patient or PCP may request re-enrollment as necessary. Patient enrollment status has been updated to previously enrolled and RN Care Manager has been removed from the care plan.  ? ?[name], BSN, RN-BC ?Embedded Chronic Care Manager ?[facility] ?Direct Dial: [phone] ? ?

## 2021-12-24 NOTE — Telephone Encounter (Signed)
Attempts to contact pt without a return call in over 3 days, will close encounter. °

## 2022-01-02 ENCOUNTER — Other Ambulatory Visit: Payer: Self-pay | Admitting: Family

## 2022-01-15 ENCOUNTER — Other Ambulatory Visit: Payer: Self-pay | Admitting: Family Medicine

## 2022-02-06 ENCOUNTER — Telehealth: Payer: Self-pay | Admitting: Family Medicine

## 2022-02-06 DIAGNOSIS — R911 Solitary pulmonary nodule: Secondary | ICD-10-CM

## 2022-02-06 DIAGNOSIS — J9611 Chronic respiratory failure with hypoxia: Secondary | ICD-10-CM

## 2022-02-06 DIAGNOSIS — I5032 Chronic diastolic (congestive) heart failure: Secondary | ICD-10-CM

## 2022-02-06 NOTE — Telephone Encounter (Signed)
I spoke with [name] regarding Surgical Clearance appointment and due to pt's chronic health conditions pt needs to see pulmonology and cardiology to be cleared for surgery prior to her signing off. Pt's son is aware and is ok with [place] placing appropriate referrals. Referrals placed and pt's son is aware that it can take 7-10 business days to hear about appointments but it may be month(s) before pt is able to get in. Also pt's son is aware we cancelled her appts with [place].

## 2022-02-09 ENCOUNTER — Ambulatory Visit: Payer: Medicare Other | Admitting: Family Medicine

## 2022-02-11 ENCOUNTER — Ambulatory Visit: Payer: Medicare Other | Admitting: Family Medicine

## 2022-02-11 NOTE — Progress Notes (Unsigned)
Cardiology Office Note   Date:  02/12/2022   ID:  Carrie Crawford, DOB 1926-06-26, MRN [id number]  PCP:  [name], FNP    No chief complaint on file.  Preoperative clearance  Wt Readings from Last 3 Encounters:  02/12/22 180 lb (81.6 kg)  10/09/21 180 lb 12.4 oz (82 kg)  07/13/21 189 lb 9.5 oz (86 kg)       History of Present Illness: Carrie Crawford is a 88 y.o. female who is being seen today for the evaluation of chronic diastolic heart failure at the request of [name], FNP.   Records show surgery will be for hearing loss: "bilateral myringotomy and tympanostomy tube placement under general anesthesia. Risks, benefits, alternatives were discussed, and they agreed to proceed with surgery. This will be scheduled in the near future at their convenience, with planned follow-up 4 to 6 weeks postop with repeat hearing test to be performed at that time. We will obtain clearance from patient's PCP for surgery."  Echocardiogram from March 2023 shows: "No evidience of valvular vegetation.   2. Left ventricular ejection fraction, by estimation, is 70 to 75%. The  left ventricle has hyperdynamic function. The left ventricle has no  regional wall motion abnormalities. There is mild left ventricular  hypertrophy of the septal segment. Left  ventricular diastolic parameters are consistent with Grade I diastolic  dysfunction (impaired relaxation).   3. Right ventricular systolic function is normal. The right ventricular  size is normal. There is moderately elevated pulmonary artery systolic  pressure.   4. The mitral valve is normal in structure. No evidence of mitral valve  regurgitation. No evidence of mitral stenosis.   5. The aortic valve is normal in structure. Aortic valve regurgitation is  not visualized. No aortic stenosis is present.   6. The inferior vena cava is normal in size with greater than 50%  respiratory variability, suggesting right atrial pressure  of 3 mmHg."  The patient comes to today's visit with her son.  She denies any cardiac symptoms.  Tube placement was attempted without general anesthesia but the patient was unable to stay still.  The surgery will be reattempted with general anesthesia.  The patient is nonambulatory.  Past Medical History:  Diagnosis Date   Arthritis    "spine" (03/16/2018)   CHF (congestive heart failure) ([facility])    Chronic bronchitis ([facility])    Chronic kidney disease    Compression fracture of lumbar vertebra ([facility])    COPD (chronic obstructive pulmonary disease) ([facility])    Dyslipidemia    Hypertension    Multiple allergies    Osteopenia    Rhinitis, chronic     Past Surgical History:  Procedure Laterality Date   CATARACT EXTRACTION W/ INTRAOCULAR LENS  IMPLANT, BILATERAL Bilateral    CHOLECYSTECTOMY     FRACTURE SURGERY     OPEN REDUCTION INTERNAL FIXATION (ORIF) DISTAL RADIAL FRACTURE Right 03/16/2018   ORIF FEMUR FRACTURE Right 03/16/2018   Procedure: OPEN REDUCTION INTERNAL FIXATION (ORIF) DISTAL FEMUR FRACTURE;  Surgeon: Shona Needles, MD;  Location: [facility];  Service: Orthopedics;  Laterality: Right;   THYROIDECTOMY, PARTIAL  2004   [facility]     Current Outpatient Medications  Medication Sig Dispense Refill   ascorbic acid (VITAMIN C) 500 MG tablet Take 1 tablet (500 mg total) by mouth daily. 30 tablet 1   aspirin 81 MG tablet Take 81 mg by mouth daily.     azelastine (ASTELIN) 0.1 %  nasal spray 2 sprays in each nostril at bedtime. 90 mL 3   dextromethorphan-guaiFENesin (MUCINEX DM) 30-600 MG 12hr tablet Take 1 tablet by mouth 2 (two) times daily. 20 tablet 0   donepezil (ARICEPT) 5 MG tablet Take 1 tablet (5 mg total) by mouth at bedtime. (NEEDS TO BE SEEN BEFORE NEXT REFILL) 30 tablet 0   feeding supplement (ENSURE ENLIVE / ENSURE PLUS) LIQD Take 237 mLs by mouth 2 (two) times daily between meals. 237 mL 12   Ferrous Sulfate (IRON PO) Take 1 tablet by mouth daily.      Fluticasone-Umeclidin-Vilant (TRELEGY ELLIPTA) 100-62.5-25 MCG/ACT AEPB Inhale 1 puff into the lungs daily. 60 each 0   furosemide (LASIX) 40 MG tablet Take 0.5 tablets (20 mg total) by mouth daily.     losartan (COZAAR) 100 MG tablet TAKE ONE TABLET AT BEDTIME (Patient taking differently: Take 100 mg by mouth daily.) 30 tablet 5   MULTIPLE VITAMIN PO Take 1 tablet by mouth daily. One a day     nystatin cream (MYCOSTATIN) Apply 1 application topically 2 (two) times daily. 30 g 2   OXYGEN Inhale into the lungs. Per patient's son liters 1 1/2 now, at home 02 liters 1 1/4     Probiotic Product (PROBIOTIC PO) Take 1 tablet by mouth daily.     Simethicone (GAS RELIEF PO) Take 1 tablet by mouth at bedtime.     sodium chloride (OCEAN) 0.65 % SOLN nasal spray Place 1 spray into both nostrils as needed for congestion.     vitamin B-12 (CYANOCOBALAMIN) 500 MCG tablet Take 1,000 mcg by mouth daily.     albuterol (PROVENTIL) (2.5 MG/3ML) 0.083% nebulizer solution Take 2.5 mg by nebulization every 4 (four) hours as needed for wheezing or shortness of breath. (Patient not taking: Reported on 02/12/2022)     pantoprazole (PROTONIX) 40 MG tablet TAKE ONE TABLET BY MOUTH DAILY (Patient not taking: Reported on 02/12/2022) 30 tablet 0   potassium chloride (MICRO-K) 10 MEQ CR capsule Take 4 capsules (40 mEq total) by mouth 2 (two) times daily. (NEEDS TO BE SEEN BEFORE NEXT REFILL) 240 capsule 0   No current facility-administered medications for this visit.    Allergies:   Patient has no known allergies.    Social History:  The patient  reports that she has never smoked. She has never used smokeless tobacco. She reports that she does not drink alcohol and does not use drugs.   Family History:  The patient's family history includes Cancer in her daughter; Heart disease in her brother.    ROS:  Please see the history of present illness.   Otherwise, review of systems are positive for .   All other systems are reviewed  and negative.    PHYSICAL EXAM: VS:  BP (!) 110/58   Pulse 76   Ht 5\' 4"  (1.626 m) Comment: verbal, per patient's son  Wt 180 lb (81.6 kg) Comment: per patient's son. Patient unable to stand of weigh scale.  wheelchair  SpO2 96%   BMI 30.90 kg/m  , BMI Body mass index is 30.9 kg/m. GEN: Well nourished, well developed, in no acute distress, frail HEENT: hardo fo hearing Neck: no JVD, carotid bruits, or masses Cardiac: RRR; no murmurs, rubs, or gallops,no edema  Respiratory:  clear to auscultation bilaterally, normal work of breathing GI: soft, nontender, nondistended, + BS MS: no deformity or atrophy Skin: warm and dry, no rash Neuro:  Strength and sensation are intact Psych: euthymic  mood, full affect   EKG:   The ekg ordered today demonstrates normal sinus rhythm, LVH, nonspecific ST-T wave changes   Recent Labs: 10/05/2021: ALT 10 10/09/2021: Magnesium 2.0 10/20/2021: Hemoglobin 9.5; Platelets 335 11/06/2021: BUN 14; Creatinine, Ser 0.76; Potassium 3.5; Sodium 133   Lipid Panel    Component Value Date/Time   CHOL 184 10/27/2016 1605   TRIG 112.0 10/27/2016 1605   TRIG 157 (H) 04/26/2013 1024   HDL 56.20 10/27/2016 1605   HDL 46 04/26/2013 1024   CHOLHDL 3 10/27/2016 1605   VLDL 22.4 10/27/2016 1605   LDLCALC 105 (H) 10/27/2016 1605   LDLCALC 106 (H) 04/26/2013 1024     Other studies Reviewed: Additional studies/ records that were reviewed today with results demonstrating: Creatinine 0.7 in April 2023.   ASSESSMENT AND PLAN:  Preoperative cardiovascular examination: Patient is at higher risk for complications given her age.  The surgery was described to me as 15 minutes of general anesthesia with the anesthesia being provided through inhalation via mask.  Overall, the surgery is low risk.  Therefore, no further cardiac testing needed prior to surgery.  I do not think we can change her perioperative risk.  Given the brief nature of the surgery, I think the patient  will do fine.  I would caution to avoid excessive IV fluids given her LVH and chronic diastolic heart failure. Chronic diastolic heart failure: She appears euvolemic.  Continue furosemide for volume management. Hard of hearing.    Current medicines are reviewed at length with the patient today.  The patient concerns regarding her medicines were addressed.  The following changes have been made:  No change  Labs/ tests ordered today include:  No orders of the defined types were placed in this encounter.   Recommend 150 minutes/week of aerobic exercise Low fat, low carb, high fiber diet recommended  Disposition:   FU prn   Signed, Larae Grooms, MD  02/12/2022 1:57 PM    [facility] [address], [place], [place]  [address] Phone: [phone]; Fax: [phone]

## 2022-02-12 ENCOUNTER — Ambulatory Visit: Payer: Medicare Other | Admitting: Interventional Cardiology

## 2022-02-12 ENCOUNTER — Encounter: Payer: Self-pay | Admitting: Interventional Cardiology

## 2022-02-12 VITALS — BP 110/58 | HR 76 | Ht 64.0 in | Wt 180.0 lb

## 2022-02-12 DIAGNOSIS — H9193 Unspecified hearing loss, bilateral: Secondary | ICD-10-CM | POA: Diagnosis not present

## 2022-02-12 DIAGNOSIS — Z01810 Encounter for preprocedural cardiovascular examination: Secondary | ICD-10-CM | POA: Diagnosis not present

## 2022-02-12 DIAGNOSIS — I5032 Chronic diastolic (congestive) heart failure: Secondary | ICD-10-CM

## 2022-02-12 NOTE — Patient Instructions (Signed)
Medication Instructions:  Your physician recommends that you continue on your current medications as directed. Please refer to the Current Medication list given to you today.  *If you need a refill on your cardiac medications before your next appointment, please call your pharmacy*   Lab Work: none If you have labs (blood work) drawn today and your tests are completely normal, you will receive your results only by: [facility] (if you have MyChart) OR A paper copy in the mail If you have any lab test that is abnormal or we need to change your treatment, we will call you to review the results.   Testing/Procedures: none   Follow-Up: At [facility], you and your health needs are our priority.  As part of our continuing mission to provide you with exceptional heart care, we have created designated Provider Care Teams.  These Care Teams include your primary Cardiologist (physician) and Advanced Practice Providers (APPs -  Physician Assistants and Nurse Practitioners) who all work together to provide you with the care you need, when you need it.  We recommend signing up for the patient portal called "MyChart".  Sign up information is provided on this After Visit Summary.  MyChart is used to connect with patients for Virtual Visits (Telemedicine).  Patients are able to view lab/test results, encounter notes, upcoming appointments, etc.  Non-urgent messages can be sent to your provider as well.   To learn more about what you can do with MyChart, go to [email or web address].    Your next appointment:   As needed  The format for your next appointment:   In Person  Provider:   Larae Grooms, MD     Other Instructions    Important Information About Sugar

## 2022-02-14 ENCOUNTER — Other Ambulatory Visit: Payer: Self-pay | Admitting: Family Medicine

## 2022-02-17 ENCOUNTER — Other Ambulatory Visit: Payer: Self-pay | Admitting: Family Medicine

## 2022-02-17 NOTE — Telephone Encounter (Signed)
Appt made 02-18-2022

## 2022-02-17 NOTE — Telephone Encounter (Signed)
[name] NTBS 30 days given 01/15/22

## 2022-02-18 ENCOUNTER — Ambulatory Visit (INDEPENDENT_AMBULATORY_CARE_PROVIDER_SITE_OTHER): Payer: Medicare Other | Admitting: Family Medicine

## 2022-02-18 ENCOUNTER — Encounter: Payer: Self-pay | Admitting: Family Medicine

## 2022-02-18 DIAGNOSIS — H6523 Chronic serous otitis media, bilateral: Secondary | ICD-10-CM

## 2022-02-18 DIAGNOSIS — F0390 Unspecified dementia without behavioral disturbance: Secondary | ICD-10-CM

## 2022-02-18 DIAGNOSIS — I10 Essential (primary) hypertension: Secondary | ICD-10-CM

## 2022-02-18 MED ORDER — DONEPEZIL HCL 5 MG PO TABS: 5.0000 mg | ORAL_TABLET | Freq: Every day | ORAL | 0 refills | Status: DC

## 2022-02-18 MED ORDER — DONEPEZIL HCL 5 MG PO TABS: 5.0000 mg | ORAL_TABLET | Freq: Every day | ORAL | 1 refills | Status: DC

## 2022-02-18 NOTE — Addendum Note (Signed)
Addended by: Antonietta Barcelona D on: 02/18/2022 08:05 AM   Modules accepted: Orders

## 2022-02-18 NOTE — Progress Notes (Signed)
   Virtual Visit  Note Due to COVID-19 pandemic this visit was conducted virtually. This visit type was conducted due to national recommendations for restrictions regarding the COVID-19 Pandemic (e.g. social distancing, sheltering in place) in an effort to limit this patient's exposure and mitigate transmission in our community. All issues noted in this document were discussed and addressed.  A physical exam was not performed with this format.  I connected with Carrie Crawford on 02/18/22 at 1259 by telephone and verified that I am speaking with the correct person using two identifiers. Carrie Crawford is currently located at home and her son is currently with her during the visit. The provider, [name], FNP is located in their office at time of visit.  I discussed the limitations, risks, security and privacy concerns of performing an evaluation and management service by telephone and the availability of in person appointments. I also discussed with the patient that there may be a patient responsible charge related to this service. The patient expressed understanding and agreed to proceed.  CC: dementia  History and Present Illness:  HPI History was provided by Carrie Crawford due to Carrie Crawford's dementia. He reports that she has been doing well. Hospice is still coming out regularly and providing care and assessment. Her BP was 111/55 at their visit last week. Meghin is in need of refills on aricept today. Mortimer Crawford reports that dementia symptoms are stable. She has an upcoming appt with pulmonology for surgical clearance will tube placement under general surgery. She has been cleared by cardiology for the procedure.    ROS Denies LOS, chest pain, shortness of breath, edema, vomiting or fever.    Observations/Objective: Deferred for telephone visit.   Assessment and Plan: Marai was seen today for dementia.  Diagnoses and all orders for this visit:  Primary hypertension Well  controlled on current regimen.   Dementia without behavioral disturbance (HCC) Stable. Refills provided.  -     donepezil (ARICEPT) 5 MG tablet; Take 1 tablet (5 mg total) by mouth at bedtime.  Bilateral chronic serous otitis media Planning tube placement under general surgery. She has obtained clearance from cardiology. Has upcoming appt with pulmonology for clearance.    Follow Up Instructions: Will determine follow up pending pulmonology appt.     I discussed the assessment and treatment plan with the patient. The patient was provided an opportunity to ask questions and all were answered. The patient agreed with the plan and demonstrated an understanding of the instructions.   The patient was advised to call back or seek an in-person evaluation if the symptoms worsen or if the condition fails to improve as anticipated.  The above assessment and management plan was discussed with the patient. The patient verbalized understanding of and has agreed to the management plan. Patient is aware to call the clinic if symptoms persist or worsen. Patient is aware when to return to the clinic for a follow-up visit. Patient educated on when it is appropriate to go to the emergency department.   Time call ended:  1310  I provided 11 minutes of  non face-to-face time during this encounter.    [name], FNP

## 2022-03-03 NOTE — Progress Notes (Unsigned)
Synopsis: Referred for nodule, dyspnea by [name], FNP  Subjective:   PATIENT ID: Carrie Crawford GENDER: female DOB: 1926/04/02, MRN: [id number]  No chief complaint on file.  88yF with dementia, CHF, CKD, COPD, HTN, CRS referred for nodule, mediastinal LAD, and dyspnea  Also has upcoming BMT surgery for chronic serous OM under general anesthesia - described at ~15 min anesthetic via mask inhalation.   Otherwise pertinent review of systems is negative.  Past Medical History:  Diagnosis Date   Arthritis    "spine" (03/16/2018)   CHF (congestive heart failure) (HCC)    Chronic bronchitis (HCC)    Chronic kidney disease    Compression fracture of lumbar vertebra (HCC)    COPD (chronic obstructive pulmonary disease) (HCC)    Dyslipidemia    Hypertension    Multiple allergies    Osteopenia    Rhinitis, chronic      Family History  Problem Relation Age of Onset   Heart disease Brother    Cancer Daughter        endometial/uterine cancer(nodule in right lung)     Past Surgical History:  Procedure Laterality Date   CATARACT EXTRACTION W/ INTRAOCULAR LENS  IMPLANT, BILATERAL Bilateral    CHOLECYSTECTOMY     FRACTURE SURGERY     OPEN REDUCTION INTERNAL FIXATION (ORIF) DISTAL RADIAL FRACTURE Right 03/16/2018   ORIF FEMUR FRACTURE Right 03/16/2018   Procedure: OPEN REDUCTION INTERNAL FIXATION (ORIF) DISTAL FEMUR FRACTURE;  Surgeon: Shona Needles, MD;  Location: [facility];  Service: Orthopedics;  Laterality: Right;   THYROIDECTOMY, PARTIAL  2004   [facility]    Social History   Socioeconomic History   Marital status: Widowed    Spouse name: Not on file   Number of children: Not on file   Years of education: Not on file   Highest education level: Not on file  Occupational History   Not on file  Tobacco Use   Smoking status: Never   Smokeless tobacco: Never  Vaping Use   Vaping Use: Never used  Substance and Sexual Activity   Alcohol use: No    Alcohol/week: 0.0  standard drinks of alcohol   Drug use: No   Sexual activity: Not Currently  Other Topics Concern   Not on file  Social History Narrative   Not on file   Social Determinants of Health   Financial Resource Strain: Not on file  Food Insecurity: No Food Insecurity (11/29/2020)   Hunger Vital Sign    Worried About Running Out of Food in the Last Year: Never true    Ran Out of Food in the Last Year: Never true  Transportation Needs: No Transportation Needs (11/29/2020)   PRAPARE - Hydrologist (Medical): No    Lack of Transportation (Non-Medical): No  Physical Activity: Not on file  Stress: Not on file  Social Connections: Socially Isolated (11/29/2020)   Social Connection and Isolation Panel [NHANES]    Frequency of Communication with Friends and Family: Never    Frequency of Social Gatherings with Friends and Family: More than three times a week    Attends Religious Services: Never    Marine scientist or Organizations: No    Attends Archivist Meetings: Never    Marital Status: Widowed  Intimate Partner Violence: Not on file     No Known Allergies   Outpatient Medications Prior to Visit  Medication Sig Dispense Refill   albuterol (PROVENTIL) (2.5  MG/3ML) 0.083% nebulizer solution Take 2.5 mg by nebulization every 4 (four) hours as needed for wheezing or shortness of breath. (Patient not taking: Reported on 02/12/2022)     ascorbic acid (VITAMIN C) 500 MG tablet Take 1 tablet (500 mg total) by mouth daily. 30 tablet 1   aspirin 81 MG tablet Take 81 mg by mouth daily.     azelastine (ASTELIN) 0.1 % nasal spray 2 sprays in each nostril at bedtime. 90 mL 3   dextromethorphan-guaiFENesin (MUCINEX DM) 30-600 MG 12hr tablet Take 1 tablet by mouth 2 (two) times daily. 20 tablet 0   donepezil (ARICEPT) 5 MG tablet Take 1 tablet (5 mg total) by mouth at bedtime. 90 tablet 1   feeding supplement (ENSURE ENLIVE / ENSURE PLUS) LIQD Take 237 mLs by mouth  2 (two) times daily between meals. 237 mL 12   Ferrous Sulfate (IRON PO) Take 1 tablet by mouth daily.     Fluticasone-Umeclidin-Vilant (TRELEGY ELLIPTA) 100-62.5-25 MCG/ACT AEPB Inhale 1 puff into the lungs daily. (NEEDS TO BE SEEN BEFORE NEXT REFILL) 84 each 0   furosemide (LASIX) 40 MG tablet Take 0.5 tablets (20 mg total) by mouth daily.     losartan (COZAAR) 100 MG tablet TAKE ONE TABLET AT BEDTIME (Patient taking differently: Take 100 mg by mouth daily.) 30 tablet 5   MULTIPLE VITAMIN PO Take 1 tablet by mouth daily. One a day     nystatin cream (MYCOSTATIN) Apply 1 application topically 2 (two) times daily. 30 g 2   OXYGEN Inhale into the lungs. Per patient's son liters 1 1/2 now, at home 02 liters 1 1/4     pantoprazole (PROTONIX) 40 MG tablet TAKE ONE TABLET BY MOUTH DAILY (Patient not taking: Reported on 02/12/2022) 30 tablet 0   potassium chloride (MICRO-K) 10 MEQ CR capsule Take 4 capsules (40 mEq total) by mouth 2 (two) times daily. (NEEDS TO BE SEEN BEFORE NEXT REFILL) 240 capsule 0   Probiotic Product (PROBIOTIC PO) Take 1 tablet by mouth daily.     Simethicone (GAS RELIEF PO) Take 1 tablet by mouth at bedtime.     sodium chloride (OCEAN) 0.65 % SOLN nasal spray Place 1 spray into both nostrils as needed for congestion.     vitamin B-12 (CYANOCOBALAMIN) 500 MCG tablet Take 1,000 mcg by mouth daily.     No facility-administered medications prior to visit.       Objective:   Physical Exam:  General appearance: 88 y.o., female, NAD, conversant, female, NAD, conversant  Eyes: anicteric sclerae; PERRL, tracking appropriately HENT: NCAT; MMM Neck: Trachea midline; no lymphadenopathy, no JVD Lungs: CTAB, no crackles, no wheeze, with normal respiratory effort CV: RRR, no murmur  Abdomen: Soft, non-tender; non-distended, BS present  Extremities: No peripheral edema, warm Skin: Normal turgor and texture; no rash Psych: Appropriate affect Neuro: Alert and oriented to person and place, no focal deficit      There were no vitals filed for this visit.   on *** LPM *** RA BMI Readings from Last 3 Encounters:  02/12/22 30.90 kg/m  10/09/21 31.03 kg/m  09/19/21 31.75 kg/m   Wt Readings from Last 3 Encounters:  02/12/22 180 lb (81.6 kg)  10/09/21 180 lb 12.4 oz (82 kg)  07/13/21 189 lb 9.5 oz (86 kg)     CBC    Component Value Date/Time   WBC 5.5 10/20/2021 1000   WBC 9.2 10/09/2021 0519   RBC 2.97 (L) 10/20/2021 1000   RBC 2.78 (L) 10/09/2021 1245  HGB 9.5 (L) 10/20/2021 1000   HCT 27.3 (L) 10/20/2021 1000   PLT 335 10/20/2021 1000   MCV 92 10/20/2021 1000   MCH 32.0 10/20/2021 1000   MCH 31.3 10/09/2021 0519   MCHC 34.8 10/20/2021 1000   MCHC 31.4 10/09/2021 0519   RDW 11.8 10/20/2021 1000   LYMPHSABS 2.4 10/20/2021 1000   MONOABS 1.4 (H) 10/04/2021 1627   EOSABS 0.1 10/20/2021 1000   BASOSABS 0.1 10/20/2021 1000    ***  Chest Imaging: CT  Chest 10/06/21 with spiculated LUL 46mm nodule, multifocal pneumonia, mediastinal LAD  Pulmonary Functions Testing Results:     No data to display           Echocardiogram:  TTE 10/06/21:  1. No evidience of valvular vegetation.   2. Left ventricular ejection fraction, by estimation, is 70 to 75%. The  left ventricle has hyperdynamic function. The left ventricle has no  regional wall motion abnormalities. There is mild left ventricular  hypertrophy of the septal segment. Left  ventricular diastolic parameters are consistent with Grade I diastolic  dysfunction (impaired relaxation).   3. Right ventricular systolic function is normal. The right ventricular  size is normal. There is moderately elevated pulmonary artery systolic  pressure.   4. The mitral valve is normal in structure. No evidence of mitral valve  regurgitation. No evidence of mitral stenosis.   5. The aortic valve is normal in structure. Aortic valve regurgitation is  not visualized. No aortic stenosis is present.   6. The inferior vena cava is normal  in size with greater than 50%  respiratory variability, suggesting right atrial pressure of 3 mmHg.   Heart Catheterization: ***    Assessment & Plan:    Plan:      Maryjane Hurter, MD [facility] 03/03/2022 5:55 PM

## 2022-03-05 ENCOUNTER — Telehealth: Payer: Self-pay | Admitting: *Deleted

## 2022-03-05 ENCOUNTER — Encounter: Payer: Self-pay | Admitting: Student

## 2022-03-05 ENCOUNTER — Ambulatory Visit (INDEPENDENT_AMBULATORY_CARE_PROVIDER_SITE_OTHER): Admitting: Student

## 2022-03-05 VITALS — BP 118/68 | HR 79 | Ht 64.0 in | Wt 180.0 lb

## 2022-03-05 DIAGNOSIS — J449 Chronic obstructive pulmonary disease, unspecified: Secondary | ICD-10-CM | POA: Diagnosis not present

## 2022-03-05 DIAGNOSIS — J9611 Chronic respiratory failure with hypoxia: Secondary | ICD-10-CM

## 2022-03-05 DIAGNOSIS — R911 Solitary pulmonary nodule: Secondary | ICD-10-CM

## 2022-03-05 NOTE — Patient Instructions (Signed)
-   would stay on trelegy 1 puff once daily, rinse mouth after use - will hold off on further imaging for left upper lobe nodule, mediastinal lymphadenopathy - will write clearance for BMT surgery

## 2022-03-05 NOTE — Telephone Encounter (Signed)
I spoke to son to let him know we received notes from both Cardiology and pulmonology where they cleared her for surgery. Advised pt's son Marjorie Smolder signed the Pre-Op clearance form and I faxed it to [facility] at ENT and pt's son voiced understanding.

## 2022-03-06 ENCOUNTER — Other Ambulatory Visit: Payer: Self-pay | Admitting: Otolaryngology

## 2022-03-20 ENCOUNTER — Other Ambulatory Visit: Payer: Self-pay | Admitting: Family Medicine

## 2022-03-27 ENCOUNTER — Encounter (HOSPITAL_COMMUNITY): Payer: Self-pay | Admitting: Otolaryngology

## 2022-03-27 ENCOUNTER — Other Ambulatory Visit: Payer: Self-pay

## 2022-03-27 NOTE — Progress Notes (Addendum)
PCP - Denny Peon, [place] FNP Cardiologist - Larae Grooms, MD  PPM/ICD - denies  Chest x-ray - 09/19/21 EKG - 02/12/22 ECHO - 10/06/21 Cardiac Cath - denies  CPAP - n/a  Fasting Blood Sugar - n/a  Blood Thinner Instructions: n/a Aspirin Instructions: patient's son was instructed to call MD and ask if the patient must hold Aspirin prior surgery. Son verbalized understanding. Patient's son was instructed: As of today, STOP taking any Aspirin (unless otherwise instructed by your surgeon) Aleve, Naproxen, Ibuprofen, Motrin, Advil, Goody's, BC's, all herbal medications, fish oil, and all vitamins.  ERAS Protcol - yes, until 10:00 o'clock  COVID TEST- n/a  Anesthesia review: yes - cardiac history, patient on Oxygen 1.5L - the chart was reviewed by Toni Arthurs  Patient verbally denies any shortness of breath, fever, cough and chest pain during phone call   -------------  SDW INSTRUCTIONS given:  Your procedure is scheduled on Monday, September 18th, 2023.  Report to [facility] Main Entrance "A" at 10:30 A.M., and check in at the Admitting office.  Call this number if you have problems the morning of surgery:  [phone]   Remember:  Do not eat after midnight the night before your surgery  You may drink clear liquids until 10:00 the morning of your surgery.   Clear liquids allowed are: Water, Non-Citrus Juices (without pulp), Carbonated Beverages, Clear Tea, Black Coffee Only, and Gatorade    Take these medicines the morning of surgery with A SIP OF WATER Mucinex PRN: nasal spray, inhalers Please bring all inhalers with you the day of surgery.     The day of surgery:                    Do not wear jewelry, make up, or nail polish            Do not wear lotions, powders, perfumes, or deodorant.            Do not shave 48 hours prior to surgery.              Do not bring valuables to the hospital.            [facility] is not responsible for any belongings or  valuables.  Do NOT Smoke (Tobacco/Vaping) 24 hours prior to your procedure If you use a CPAP at night, you may bring all equipment for your overnight stay.   Contacts, glasses, dentures or bridgework may not be worn into surgery.      For patients admitted to the hospital, discharge time will be determined by your treatment team.   Patients discharged the day of surgery will not be allowed to drive home, and someone needs to stay with them for 24 hours.    Special instructions:   [facility]- Preparing For Surgery  Before surgery, you can play an important role. Because skin is not sterile, your skin needs to be as free of germs as possible. You can reduce the number of germs on your skin by washing with CHG (chlorahexidine gluconate) Soap before surgery.  CHG is an antiseptic cleaner which kills germs and bonds with the skin to continue killing germs even after washing.    Oral Hygiene is also important to reduce your risk of infection.  Remember - BRUSH YOUR TEETH THE MORNING OF SURGERY WITH YOUR REGULAR TOOTHPASTE  Please do not use if you have an allergy to CHG or antibacterial soaps. If your skin becomes reddened/irritated stop using  the CHG.  Do not shave (including legs and underarms) for at least 48 hours prior to first CHG shower. It is OK to shave your face.  Please follow these instructions carefully.   Shower the NIGHT BEFORE SURGERY and the MORNING OF SURGERY with DIAL Soap.   Pat yourself dry with a CLEAN TOWEL.  Wear CLEAN PAJAMAS to bed the night before surgery  Place CLEAN SHEETS on your bed the night of your first shower and DO NOT SLEEP WITH PETS.   Day of Surgery: Please shower morning of surgery  Wear Clean/Comfortable clothing the morning of surgery Do not apply any deodorants/lotions.   Remember to brush your teeth WITH YOUR REGULAR TOOTHPASTE.   Questions were answered. Patient verbalized understanding of instructions.

## 2022-03-27 NOTE — Progress Notes (Signed)
Anesthesia Chart Review: SAME DAY WORK-UP  Case: [id number] Date/Time: 03/30/22 1200   Procedure: MYRINGOTOMY WITH TUBE PLACEMENT (Bilateral)   Anesthesia type: General   Pre-op diagnosis:      Bilateral chronic serous otitis media     Dysfunction of both eustachian tubes     Profound hearing loss of both ears   Location: [facility] OR ROOM 09 / [facility]   Surgeons: Skotnicki, Meghan A, DO       DISCUSSION: Patient is a 88 year old female scheduled for the above procedure.  History includes never smoker, HTN, dyslipidemia, chronic diastolic CHF, dementia, COPD (Gold B; home O2 @ 1-2 L), LUL pulmonary nodule (with mediastinal lymphadenopathy), CKD, left thyroidectomy (06/03/07), cholecystectomy (11/12/00), right femur fracture (s/p ORIF 03/16/18).  Her PCP [name], FNP recommended preoperative cardiology and pulmonology evaluations.  Evaluated on 03/05/22 by pulmonologist Dr. Verlee Monte for preoperative evaluation.  She also has COPD (secondhand smoke exposure), chronic hypoxic respiratory failure, and a left upper lobe pulmonary nodule. He wrote,  "- would stay on trelegy 1 puff once daily, rinse mouth after use - Discussed options for further CT Chest surveillance of nodule/LAD will hold off on further imaging for now/invasive workup. She is on hospice and frail. - will write clearance for BMT surgery, hopefully this will improve her quality of life"  Evaluated by cardiologist Dr. Irish Lack on 02/12/22. He wrote, "Preoperative cardiovascular examination: Patient is at higher risk for complications given her age.  The surgery was described to me as 15 minutes of general anesthesia with the anesthesia being provided through inhalation via mask.  Overall, the surgery is low risk.  Therefore, no further cardiac testing needed prior to surgery.  I do not think we can change her perioperative risk.  Given the brief nature of the surgery, I think the patient will do fine.  I would caution to avoid excessive IV  fluids given her LVH and chronic diastolic heart failure."  She is a same-day work-up so labs and anesthesia team evaluation on the day of surgery.   VS:  BP Readings from Last 3 Encounters:  03/05/22 118/68  02/12/22 (!) 110/58  10/16/21 111/60   Pulse Readings from Last 3 Encounters:  03/05/22 79  02/12/22 76  10/16/21 88     PROVIDERS: [name], FNP is PCP  Larae Grooms, MD is cardiologist Leslye Peer, MD is pulmonologist   LABS: For day of surgery as indicated.  Last results in [facility] include: Lab Results  Component Value Date   WBC 5.5 10/20/2021   HGB 9.5 (L) 10/20/2021   HCT 27.3 (L) 10/20/2021   PLT 335 10/20/2021   GLUCOSE 123 (H) 11/06/2021   ALT 10 10/05/2021   AST 12 (L) 10/05/2021   NA 133 (L) 11/06/2021   K 3.5 11/06/2021   CL 93 (L) 11/06/2021   CREATININE 0.76 11/06/2021   BUN 14 11/06/2021   CO2 26 11/06/2021   INR 1.3 (H) 10/04/2021     IMAGES: CT Chest 10/06/21: IMPRESSION: 1. Spiculated 7 mm left upper lobe nodule, worrisome for primary bronchogenic carcinoma. 2. Ground-glass and collapse/consolidation in the posterior segment right upper lobe and right lower lobe, indicative of aspiration or pneumonia. 3. Mild volume loss in the lingula and left lower lobe, possibly chronic. 4. 6 mm right upper lobe nodule, indeterminate. 5. Small to borderline enlarged mediastinal lymph nodes, indeterminate, possibly reactive. 6. Liver appears cirrhotic. 7. At least minimally complex low-attenuation lesion off the left kidney, indeterminate without post-contrast  imaging. 8. Aortic atherosclerosis (ICD10-I70.0). Coronary artery calcification. 9. Enlarged pulmonic trunk, indicative of pulmonary arterial hypertension.   EKG: 02/12/2022: Normal sinus rhythm Left axis deviation Moderate voltage criteria for LVH, may be normal variant   CV: Echo 10/06/21: IMPRESSIONS   1. No evidience of valvular vegetation.   2. Left ventricular  ejection fraction, by estimation, is 70 to 75%. The  left ventricle has hyperdynamic function. The left ventricle has no  regional wall motion abnormalities. There is mild left ventricular  hypertrophy of the septal segment. Left  ventricular diastolic parameters are consistent with Grade I diastolic  dysfunction (impaired relaxation).   3. Right ventricular systolic function is normal. The right ventricular  size is normal. There is moderately elevated pulmonary artery systolic  pressure.   4. The mitral valve is normal in structure. No evidence of mitral valve  regurgitation. No evidence of mitral stenosis.   5. The aortic valve is normal in structure. Aortic valve regurgitation is  not visualized. No aortic stenosis is present.   6. The inferior vena cava is normal in size with greater than 50%  respiratory variability, suggesting right atrial pressure of 3 mmHg.    Past Medical History:  Diagnosis Date   Arthritis    "spine" (03/16/2018)   CHF (congestive heart failure) (HCC)    Chronic bronchitis (HCC)    Chronic kidney disease    Compression fracture of lumbar vertebra (HCC)    COPD (chronic obstructive pulmonary disease) (HCC)    Dementia (HCC)    Dyslipidemia    Hypertension    Lung nodule 10/06/2021   Multiple allergies    Osteopenia    Rhinitis, chronic     Past Surgical History:  Procedure Laterality Date   CATARACT EXTRACTION W/ INTRAOCULAR LENS  IMPLANT, BILATERAL Bilateral    CHOLECYSTECTOMY     FRACTURE SURGERY     OPEN REDUCTION INTERNAL FIXATION (ORIF) DISTAL RADIAL FRACTURE Right 03/16/2018   ORIF FEMUR FRACTURE Right 03/16/2018   Procedure: OPEN REDUCTION INTERNAL FIXATION (ORIF) DISTAL FEMUR FRACTURE;  Surgeon: Shona Needles, MD;  Location: [facility];  Service: Orthopedics;  Laterality: Right;   THYROIDECTOMY, PARTIAL  2004   [facility]    MEDICATIONS: No current facility-administered medications for this encounter.    albuterol (PROVENTIL) (2.5 MG/3ML)  0.083% nebulizer solution   ascorbic acid (VITAMIN C) 500 MG tablet   aspirin 81 MG tablet   azelastine (ASTELIN) 0.1 % nasal spray   Cyanocobalamin 1000 MCG CAPS   dextromethorphan-guaiFENesin (MUCINEX DM) 30-600 MG 12hr tablet   donepezil (ARICEPT) 5 MG tablet   feeding supplement (ENSURE ENLIVE / ENSURE PLUS) LIQD   Ferrous Sulfate (IRON PO)   furosemide (LASIX) 40 MG tablet   losartan (COZAAR) 100 MG tablet   MULTIPLE VITAMIN PO   nystatin cream (MYCOSTATIN)   OXYGEN   Probiotic Product (PROBIOTIC PO)   Simethicone (GAS RELIEF PO)   sodium chloride (OCEAN) 0.65 % SOLN nasal spray   TRELEGY ELLIPTA 100-62.5-25 MCG/ACT AEPB   pantoprazole (PROTONIX) 40 MG tablet   potassium chloride (MICRO-K) 10 MEQ CR capsule    Myra Gianotti, PA-C Surgical Short Stay/Anesthesiology [facility] Phone [phone] [facility] Phone [phone] 03/27/2022 12:52 PM

## 2022-03-27 NOTE — Anesthesia Preprocedure Evaluation (Signed)
Anesthesia Evaluation  Patient identified by MRN, date of birth, ID band Patient awake    Reviewed: Allergy & Precautions, NPO status , Patient's Chart, lab work & pertinent test results  Airway Mallampati: IV  TM Distance: <3 FB Neck ROM: Full    Dental no notable dental hx.    Pulmonary COPD,  oxygen dependent,    Pulmonary exam normal breath sounds clear to auscultation       Cardiovascular hypertension, Pt. on medications Normal cardiovascular exam Rhythm:Regular Rate:Normal     Neuro/Psych Dementia negative neurological ROS     GI/Hepatic negative GI ROS, Neg liver ROS,   Endo/Other  negative endocrine ROS  Renal/GU Renal InsufficiencyRenal disease  negative genitourinary   Musculoskeletal negative musculoskeletal ROS (+)   Abdominal   Peds negative pediatric ROS (+)  Hematology negative hematology ROS (+)   Anesthesia Other Findings Very hard of hearing L ear is the best  Reproductive/Obstetrics negative OB ROS                          Anesthesia Physical Anesthesia Plan  ASA: 3  Anesthesia Plan: General   Post-op Pain Management: Minimal or no pain anticipated   Induction: Inhalational  PONV Risk Score and Plan: 3 and Treatment may vary due to age or medical condition  Airway Management Planned: Mask  Additional Equipment:   Intra-op Plan:   Post-operative Plan: Extubation in OR  Informed Consent: I have reviewed the patients History and Physical, chart, labs and discussed the procedure including the risks, benefits and alternatives for the proposed anesthesia with the patient or authorized representative who has indicated his/her understanding and acceptance.     Dental advisory given  Plan Discussed with: CRNA and Surgeon  Anesthesia Plan Comments: (PAT note written 03/27/2022 by Shonna Chock, PA-C. )      Anesthesia Quick Evaluation

## 2022-03-30 ENCOUNTER — Ambulatory Visit (HOSPITAL_COMMUNITY): Payer: Medicare Other | Admitting: Vascular Surgery

## 2022-03-30 ENCOUNTER — Ambulatory Visit (HOSPITAL_BASED_OUTPATIENT_CLINIC_OR_DEPARTMENT_OTHER): Payer: Medicare Other | Admitting: Vascular Surgery

## 2022-03-30 ENCOUNTER — Encounter (HOSPITAL_COMMUNITY)
Admission: RE | Disposition: A | Discharge status: Home / Self Care | Payer: Self-pay | Source: Home / Self Care | Attending: Otolaryngology

## 2022-03-30 ENCOUNTER — Encounter (HOSPITAL_COMMUNITY): Payer: Self-pay | Admitting: Otolaryngology

## 2022-03-30 ENCOUNTER — Ambulatory Visit (HOSPITAL_COMMUNITY)
Admission: RE | Admit: 2022-03-30 | Discharge: 2022-03-30 | Disposition: A | Discharge status: Home / Self Care | Payer: Medicare Other | Attending: Otolaryngology | Admitting: Otolaryngology

## 2022-03-30 DIAGNOSIS — F0390 Unspecified dementia without behavioral disturbance: Secondary | ICD-10-CM | POA: Insufficient documentation

## 2022-03-30 DIAGNOSIS — I509 Heart failure, unspecified: Secondary | ICD-10-CM | POA: Diagnosis not present

## 2022-03-30 DIAGNOSIS — N189 Chronic kidney disease, unspecified: Secondary | ICD-10-CM | POA: Insufficient documentation

## 2022-03-30 DIAGNOSIS — H6523 Chronic serous otitis media, bilateral: Secondary | ICD-10-CM | POA: Diagnosis present

## 2022-03-30 DIAGNOSIS — J449 Chronic obstructive pulmonary disease, unspecified: Secondary | ICD-10-CM | POA: Insufficient documentation

## 2022-03-30 DIAGNOSIS — I130 Hypertensive heart and chronic kidney disease with heart failure and stage 1 through stage 4 chronic kidney disease, or unspecified chronic kidney disease: Secondary | ICD-10-CM | POA: Diagnosis not present

## 2022-03-30 DIAGNOSIS — Z7982 Long term (current) use of aspirin: Secondary | ICD-10-CM | POA: Diagnosis not present

## 2022-03-30 DIAGNOSIS — H9193 Unspecified hearing loss, bilateral: Secondary | ICD-10-CM | POA: Diagnosis not present

## 2022-03-30 DIAGNOSIS — H6993 Unspecified Eustachian tube disorder, bilateral: Secondary | ICD-10-CM | POA: Insufficient documentation

## 2022-03-30 DIAGNOSIS — I2510 Atherosclerotic heart disease of native coronary artery without angina pectoris: Secondary | ICD-10-CM | POA: Insufficient documentation

## 2022-03-30 DIAGNOSIS — H918X3 Other specified hearing loss, bilateral: Secondary | ICD-10-CM

## 2022-03-30 DIAGNOSIS — I10 Essential (primary) hypertension: Secondary | ICD-10-CM | POA: Diagnosis not present

## 2022-03-30 DIAGNOSIS — I700 Atherosclerosis of aorta: Secondary | ICD-10-CM | POA: Diagnosis not present

## 2022-03-30 DIAGNOSIS — Z9981 Dependence on supplemental oxygen: Secondary | ICD-10-CM | POA: Insufficient documentation

## 2022-03-30 HISTORY — DX: Unspecified dementia, unspecified severity, without behavioral disturbance, psychotic disturbance, mood disturbance, and anxiety: F03.90

## 2022-03-30 HISTORY — PX: MYRINGOTOMY WITH TUBE PLACEMENT: SHX5663

## 2022-03-30 HISTORY — DX: Pneumonia, unspecified organism: J18.9

## 2022-03-30 SURGERY — MYRINGOTOMY WITH TUBE PLACEMENT
Anesthesia: General | Site: Ear | Laterality: Bilateral

## 2022-03-30 MED ORDER — FENTANYL CITRATE (PF) 100 MCG/2ML IJ SOLN: 25.0000 ug | INTRAMUSCULAR | Status: DC | PRN

## 2022-03-30 MED ORDER — 0.9 % SODIUM CHLORIDE (POUR BTL) OPTIME
TOPICAL | Status: DC | PRN
  Administered 2022-03-30: 1000 mL

## 2022-03-30 MED ORDER — CHLORHEXIDINE GLUCONATE 0.12 % MT SOLN
15.0000 mL | Freq: Once | OROMUCOSAL | Status: AC
  Administered 2022-03-30: 15 mL via OROMUCOSAL
  Filled 2022-03-30: qty 15

## 2022-03-30 MED ORDER — PROPOFOL 10 MG/ML IV BOLUS
INTRAVENOUS | Status: DC | PRN
  Administered 2022-03-30: 20 mg via INTRAVENOUS

## 2022-03-30 MED ORDER — OXYMETAZOLINE HCL 0.05 % NA SOLN
NASAL | Status: AC
  Filled 2022-03-30: qty 30

## 2022-03-30 MED ORDER — OXYMETAZOLINE HCL 0.05 % NA SOLN
NASAL | Status: DC | PRN
  Administered 2022-03-30: 1

## 2022-03-30 MED ORDER — CIPROFLOXACIN-DEXAMETHASONE 0.3-0.1 % OT SUSP
OTIC | Status: DC | PRN
  Administered 2022-03-30: 4 [drp] via OTIC

## 2022-03-30 MED ORDER — LACTATED RINGERS IV SOLN: INTRAVENOUS | Status: DC

## 2022-03-30 MED ORDER — ORAL CARE MOUTH RINSE: 15.0000 mL | Freq: Once | OROMUCOSAL | Status: AC

## 2022-03-30 MED ORDER — ONDANSETRON HCL 4 MG/2ML IJ SOLN: 4.0000 mg | Freq: Once | INTRAMUSCULAR | Status: DC | PRN

## 2022-03-30 MED ORDER — CIPROFLOXACIN-DEXAMETHASONE 0.3-0.1 % OT SUSP
OTIC | Status: AC
  Filled 2022-03-30: qty 7.5

## 2022-03-30 SURGICAL SUPPLY — 19 items
BAG COUNTER SPONGE SURGICOUNT (BAG) ×1 IMPLANT
BAG SPNG CNTER NS LX DISP (BAG) ×1
BALL CTTN LRG ABS STRL LF (GAUZE/BANDAGES/DRESSINGS) ×1
BLADE MYRINGOTOMY 6 SPEAR HDL (BLADE) ×1 IMPLANT
CANISTER SUCT 3000ML PPV (MISCELLANEOUS) ×1 IMPLANT
CNTNR URN SCR LID CUP LEK RST (MISCELLANEOUS) ×1 IMPLANT
CONT SPEC 4OZ STRL OR WHT (MISCELLANEOUS) ×1
COTTONBALL LRG STERILE PKG (GAUZE/BANDAGES/DRESSINGS) ×1 IMPLANT
COVER MAYO STAND STRL (DRAPES) ×1 IMPLANT
DRAPE HALF SHEET 40X57 (DRAPES) ×1 IMPLANT
GLOVE BIO SURGEON STRL SZ 6.5 (GLOVE) ×1 IMPLANT
KIT TURNOVER KIT B (KITS) ×1 IMPLANT
NS IRRIG 1000ML POUR BTL (IV SOLUTION) ×1 IMPLANT
PAD ARMBOARD 7.5X6 YLW CONV (MISCELLANEOUS) ×1 IMPLANT
POSITIONER HEAD DONUT 9IN (MISCELLANEOUS) IMPLANT
SYR BULB EAR ULCER 3OZ GRN STR (SYRINGE) IMPLANT
TOWEL GREEN STERILE FF (TOWEL DISPOSABLE) ×1 IMPLANT
TUBE CONNECTING 12X1/4 (SUCTIONS) ×1 IMPLANT
TUBING EXTENTION W/L.L. (IV SETS) IMPLANT

## 2022-03-30 NOTE — Anesthesia Postprocedure Evaluation (Signed)
Anesthesia Post Note  Patient: Carrie Crawford  Procedure(s) Performed: MYRINGOTOMY WITH TUBE PLACEMENT (Bilateral: Ear)     Patient location during evaluation: PACU Anesthesia Type: General Level of consciousness: awake and alert Pain management: pain level controlled Vital Signs Assessment: post-procedure vital signs reviewed and stable Respiratory status: spontaneous breathing, nonlabored ventilation, respiratory function stable and patient connected to nasal cannula oxygen Cardiovascular status: blood pressure returned to baseline and stable Postop Assessment: no apparent nausea or vomiting Anesthetic complications: no   No notable events documented.  Last Vitals:  Vitals:   03/30/22 1325 03/30/22 1330  BP: (!) 141/57 (!) 154/57  Pulse: 67 (!) 57  Resp: 20 17  Temp: 36.4 C   SpO2: 99% 99%    Last Pain:  Vitals:   03/30/22 1103  TempSrc: Oral                 [name] S

## 2022-03-30 NOTE — Discharge Instructions (Addendum)
BMT Post Operative Instructions [facility]  Effects of Anesthesia Placing ear ventilation tubes (BMT) involves a very brief anesthesia, typically 5 minutes  or less. Patients may be quite irritable for 15-45 minutes after surgery, most return to  normal activity the same day. Nausea and vomiting is rarely seen, and usually resolves  by the evening of surgery - even without additional medications.  Medications:  Your doctor may give you ear drops to use after surgery: Use them as  directed by your surgeon.   Keep these drops when you are done using them because they are used  to treat clogged tubes, ear infections and chronic drainage when ear tubes  are in place.  Most patients do not need pain medications after this surgery, however  you may use regular Tylenol or Ibuprofen if needed.  Other effects of surgery:  You may see a small amount of blood from the ears for the first day or two.  This is normal. If the bleeding persists, you can place a few drops of Afrin (oxymetazoline) in the ears, which will help slow down/stop the bleeding.   Drainage usually occurs in the first few days after surgery. If it continues  after drops (if prescribed) are discontinued, call the doctor's office.  Low-grade fever may occur. Tylenol or Ibuprofen (either oral or  suppository) can be used.   Patients can return to normal activity, school or daycare the following day  after surgery.  Hearing is generally improved after tubes are inserted. Because of this, you may be sensitive to or startle with loud sounds until used to the improved hearing.  How long do tubes stay in the ears? Ear tubes remain in the ears for anywhere from 6-24 months. The average is about a  year. On infrequent occasions, they stay in the ears for several years and have to be  removed with another surgery. The tubes usually spontaneously extrude and in such  event it will be found lying loose in the ear canal or be completely  gone at a follow up  visit. The patient will probably not know when the tube comes out and it will do no harm  lying in the canal until it is removed.  What should I do if I see bleeding from the ears? Small amounts of blood soon after surgery are normal. If bleeding is seen from the ears  several months later, you may either be having an infection, an inflammatory  reaction against the tubes, or the tube is beginning to migrate out. If this happens, call  the doctor's office for further instructions.  Can I swim with tubes? Patients can swim in chlorinated pools after a BMT without earplugs. You should  avoid diving into the water. You should always use earplugs when swimming in lakes or  in the ocean.  Bathing No ear plugs are needed when bathing.  General information  Patients can still have ear infections even with tubes. Tubes will let fluid drain  out of the ear, allow for less (or no) pain, and also allow the use of topical  antibiotics instead of oral antibiotics.   Drainage from the ears is common when ear tubes are in place. It can be  normal or an indication of infection. If you see drainage from the ears for more  than 1-2 days, call the office for instructions.   Some patients will need another set of tubes after their first set come out.  Should this occur, children  often have an adenoidectomy done with the  second set of tubes as this improves drainage of the middle ear.  You will be seen a few weeks after surgery for a hearing test to confirm  tube placement and patency.   Rarely when the tubes fall out, the eardrum does not heal, leaving a hole in  the eardrum. This is called a tympanic membrane perforation and can be  repaired with surgery.

## 2022-03-30 NOTE — Transfer of Care (Signed)
Immediate Anesthesia Transfer of Care Note  Patient: Carrie Crawford  Procedure(s) Performed: MYRINGOTOMY WITH TUBE PLACEMENT (Bilateral: Ear)  Patient Location: PACU  Anesthesia Type:General  Level of Consciousness: awake, pateint uncooperative, confused and responds to stimulation  Airway & Oxygen Therapy: Patient Spontanous Breathing and Patient connected to nasal cannula oxygen  Post-op Assessment: Report given to RN, Post -op Vital signs reviewed and stable, Patient moving all extremities X 4 and Patient able to stick tongue midline  Post vital signs: Reviewed  Last Vitals:  Vitals Value Taken Time  BP 141/57 03/30/22 1324  Temp 97.6   Pulse 61 03/30/22 1326  Resp 18 03/30/22 1326  SpO2 98 % 03/30/22 1326  Vitals shown include unvalidated device data.  Last Pain:  Vitals:   03/30/22 1103  TempSrc: Oral         Complications: No notable events documented.

## 2022-03-30 NOTE — Op Note (Signed)
OPERATIVE NOTE  Carrie Crawford Date/Time of Admission: 03/30/2022 10:45 AM  CSN: [id number];NOT:[id number] Attending Provider: Ebbie Latus A, DO Room/Bed: MCPO/NONE DOB: 01-15-1926 Age: 88 y.o.   Pre-Op Diagnosis: Bilateral chronic serous otitis media Dysfunction of both eustachian tubes Profound hearing loss of both ears  Post-Op Diagnosis: Bilateral chronic serous otitis media Dysfunction of both eustachian tubes Profound hearing loss of both ears  Procedure: Procedure(s): BILATERAL MYRINGOTOMY WITH TUBE PLACEMENT  Anesthesia: General  Surgeon(s): [place], DO  Staff: Circulator: Margy Clarks, RN Scrub Person: Zannie Kehr, RN  Implants: * No implants in log *  Specimens: * No specimens in log *  Complications: None  EBL: <1 ML  Condition: stable  Operative Findings:  Excessive cerumen bilaterally, serous effusions bilaterally  Description of Operation: Once operative consent was obtained, and the surgical site confirmed with the operating room team, the patient was brought back to the operating room and mask inhalational anesthesia was obtained. The patient was turned over to the ENT service. An operating microscope was used to visualize the left external auditory canal and tympanic membrane. Ceratectomy was performed. A radial incision was made in the anterior inferior quadrant of the tympanic membrane. Middle ear contents were suctioned and a modified Richards pressure equalization tube was placed through the incision. Ciprodex drops were placed in the ear. The operating microscope was then used to visualize the right  external auditory canal and tympanic membrane. Ceratectomy was performed. A radial incision was made in the anterior inferior quadrant of the tympanic membrane. Middle ear contents were suctioned and a modified Richards pressure equalization tube was placed through the incision. Ciprodex drops were placed in the ear. The  patient was turned back over to the anesthesia service. The patient was then transferred to the PACU in stable condition.    Jason Coop, [facility]  03/30/2022

## 2022-03-30 NOTE — H&P (Signed)
Carrie Crawford is an 88 y.o. female.    Chief Complaint:  Hearing loss, serous effusions  HPI: Patient presents today for planned elective procedure.  Patient's son denies any interval change in history since office visit on 01/23/2022:  Carrie Crawford is a 88 y.o. female who presents as a return patient, referred by Pcp, Verify Patient Has, for follow up of bilateral serous otitis media with effusion. Patient had bilateral myringotomy without tympanostomy tube placement performed on 11/18/2021. Tubes were unable to be placed at that time due to poor patient tolerance and continued movement. Patient's son states that she did have a significant improvement in her hearing immediately following the procedure, but over the last several weeks, her hearing has declined. No otorrhea, no otalgia reported.   Past Medical History:  Diagnosis Date   Arthritis    "spine" (03/16/2018)   CHF (congestive heart failure) (HCC)    Chronic bronchitis (HCC)    Chronic kidney disease    Compression fracture of lumbar vertebra (HCC)    COPD (chronic obstructive pulmonary disease) (HCC)    Dementia (HCC)    Dyslipidemia    Hypertension    Lung nodule 10/06/2021   Multiple allergies    Osteopenia    Pneumonia    Rhinitis, chronic     Past Surgical History:  Procedure Laterality Date   CATARACT EXTRACTION W/ INTRAOCULAR LENS  IMPLANT, BILATERAL Bilateral    CHOLECYSTECTOMY     FRACTURE SURGERY     OPEN REDUCTION INTERNAL FIXATION (ORIF) DISTAL RADIAL FRACTURE Right 03/16/2018   ORIF FEMUR FRACTURE Right 03/16/2018   Procedure: OPEN REDUCTION INTERNAL FIXATION (ORIF) DISTAL FEMUR FRACTURE;  Surgeon: Shona Needles, MD;  Location: [facility];  Service: Orthopedics;  Laterality: Right;   THYROIDECTOMY, PARTIAL  2004   [facility]    Family History  Problem Relation Age of Onset   Heart disease Brother    Cancer Daughter        endometial/uterine cancer(nodule in right lung)    Social History:  reports that she  has never smoked. She has never used smokeless tobacco. She reports that she does not drink alcohol and does not use drugs.  Allergies: No Known Allergies  Medications Prior to Admission  Medication Sig Dispense Refill   ascorbic acid (VITAMIN C) 500 MG tablet Take 1 tablet (500 mg total) by mouth daily. 30 tablet 1   aspirin 81 MG tablet Take 81 mg by mouth daily.     azelastine (ASTELIN) 0.1 % nasal spray 2 sprays in each nostril at bedtime. 90 mL 3   Cyanocobalamin 1000 MCG CAPS Take 1,000 mcg by mouth daily.     dextromethorphan-guaiFENesin (MUCINEX DM) 30-600 MG 12hr tablet Take 1 tablet by mouth 2 (two) times daily. 20 tablet 0   donepezil (ARICEPT) 5 MG tablet Take 1 tablet (5 mg total) by mouth at bedtime. 90 tablet 1   feeding supplement (ENSURE ENLIVE / ENSURE PLUS) LIQD Take 237 mLs by mouth 2 (two) times daily between meals. (Patient taking differently: Take 237 mLs by mouth daily as needed (Snack).) 237 mL 12   Ferrous Sulfate (IRON PO) Take 65 mg by mouth daily.     furosemide (LASIX) 40 MG tablet Take 0.5 tablets (20 mg total) by mouth daily.     losartan (COZAAR) 100 MG tablet TAKE ONE TABLET AT BEDTIME (Patient taking differently: Take 100 mg by mouth at bedtime.) 30 tablet 5   MULTIPLE VITAMIN PO Take 1 tablet by mouth  daily.     OXYGEN Inhale 1.5 L into the lungs continuous. Per patient's son liters 1 1/2 now, at home 02 liters 1 1/4     Probiotic Product (PROBIOTIC PO) Take 1 tablet by mouth daily.     Simethicone (GAS RELIEF PO) Take 125 mg by mouth at bedtime.     sodium chloride (OCEAN) 0.65 % SOLN nasal spray Place 1 spray into both nostrils daily as needed for congestion.     TRELEGY ELLIPTA 100-62.5-25 MCG/ACT AEPB INHALE 1 PUFF DAILY AS DIRECTED (Patient taking differently: Inhale 1 puff into the lungs at bedtime.) 84 each 0   albuterol (PROVENTIL) (2.5 MG/3ML) 0.083% nebulizer solution Take 2.5 mg by nebulization every 4 (four) hours as needed for wheezing or  shortness of breath.     nystatin cream (MYCOSTATIN) Apply 1 application topically 2 (two) times daily. (Patient taking differently: Apply 1 application  topically daily as needed (vaginal yeast).) 30 g 2   pantoprazole (PROTONIX) 40 MG tablet TAKE ONE TABLET BY MOUTH DAILY (Patient not taking: Reported on 03/05/2022) 30 tablet 0   potassium chloride (MICRO-K) 10 MEQ CR capsule Take 4 capsules (40 mEq total) by mouth 2 (two) times daily. (NEEDS TO BE SEEN BEFORE NEXT REFILL) (Patient not taking: Reported on 03/24/2022) 240 capsule 0    No results found for this or any previous visit (from the past 16 hour(s)). No results found.  ROS: ROS  Blood pressure (!) 138/50, pulse 70, temperature 97.9 F (36.6 C), temperature source Oral, resp. rate 18, height 5\' 4"  (1.626 m), weight 83.9 kg, SpO2 98 %.  PHYSICAL EXAM: Physical Exam Constitutional:      Appearance: Normal appearance.  HENT:     Right Ear: External ear normal.     Left Ear: External ear normal.  Pulmonary:     Effort: Pulmonary effort is normal.  Neurological:     Mental Status: She is alert.  Psychiatric:        Mood and Affect: Mood normal.     Studies Reviewed: None   Assessment/Plan Carrie Crawford is a 88 y.o. 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 Bilateral myringotomy without tympanostomy tube placement was performed in the office on 11/18/2021, as patient was unable to comply with tympanostomy tube placement. Patient developed recurrent serous effusions after myringotomy incisions healed, with associated bilateral mixed hearing loss -To OR today for bilateral myringotomy and tympanostomy tube placement under general anesthesia. Risks, benefits, alternatives were reviewed, and all questions were answered.    Carrie Crawford A Azekiel Cremer 03/30/2022, 12:47 PM

## 2022-03-31 ENCOUNTER — Other Ambulatory Visit: Payer: Self-pay | Admitting: Family Medicine

## 2022-03-31 ENCOUNTER — Encounter (HOSPITAL_COMMUNITY): Payer: Self-pay | Admitting: Otolaryngology

## 2022-03-31 NOTE — Telephone Encounter (Signed)
Carrie Crawford patient. Last office visit 02/18/22 Medication is on med list but does not appear as prescribed by our office

## 2022-04-30 ENCOUNTER — Other Ambulatory Visit: Payer: Self-pay | Admitting: Family Medicine

## 2022-05-29 ENCOUNTER — Other Ambulatory Visit: Payer: Self-pay | Admitting: Family

## 2022-06-26 ENCOUNTER — Other Ambulatory Visit: Payer: Self-pay | Admitting: Family Medicine

## 2022-06-26 NOTE — Telephone Encounter (Signed)
Last office visit 02/18/22 Medication on med list as a historical med

## 2022-07-21 ENCOUNTER — Other Ambulatory Visit: Payer: Self-pay | Admitting: Family

## 2022-08-26 ENCOUNTER — Telehealth (INDEPENDENT_AMBULATORY_CARE_PROVIDER_SITE_OTHER): Payer: Medicare Other | Admitting: Family Medicine

## 2022-08-26 ENCOUNTER — Encounter: Payer: Self-pay | Admitting: Family Medicine

## 2022-08-26 DIAGNOSIS — Z85828 Personal history of other malignant neoplasm of skin: Secondary | ICD-10-CM | POA: Diagnosis not present

## 2022-08-26 DIAGNOSIS — L989 Disorder of the skin and subcutaneous tissue, unspecified: Secondary | ICD-10-CM | POA: Diagnosis not present

## 2022-08-26 NOTE — Progress Notes (Signed)
   Virtual Visit  Note Due to COVID-19 pandemic this visit was conducted virtually. This visit type was conducted due to national recommendations for restrictions regarding the COVID-19 Pandemic (e.g. social distancing, sheltering in place) in an effort to limit this patient's exposure and mitigate transmission in our community. All issues noted in this document were discussed and addressed.  A physical exam was not performed with this format.  I connected with Carrie Crawford on 08/26/22 at 13:14 by telephone and verified that I am speaking with the correct person using two identifiers. Carrie Crawford is currently located at home and her son Carrie Crawford is currently with her during the visit. The provider, [name], FNP is located in their office at time of visit.  I discussed the limitations, risks, security and privacy concerns of performing an evaluation and management service by telephone and the availability of in person appointments. I also discussed with the patient that there may be a patient responsible charge related to this service. The patient expressed understanding and agreed to proceed.  CC: skin lesion  History and Present Illness:  HPI Loveta has 4 pigmented lesions on her face that are quite large and scabbed in the center. They have been present for months. She has a previous history of skin cancer with similar lesions of her face. She was last seen by dermatology in [place] when she was in a nursing home there. She has not been seen by dermatology in a few years. Carrie Crawford would like for her to be referred to a closer location.    ROS As per HPI.   Observations/Objective: Deferred for phone visit.   Assessment and Plan: Alvis was seen today for skin lesion.  Diagnoses and all orders for this visit:  Facial skin lesion -     Cancel: Ambulatory referral to Dermatology -     Ambulatory referral to Dermatology  Hx of nonmelanoma skin cancer -     Cancel:  Ambulatory referral to Dermatology -     Ambulatory referral to Dermatology  Referral to dermatology placed.    Follow Up Instructions: As needed. Schedule chronic follow up.     I discussed the assessment and treatment plan with the patient. The patient was provided an opportunity to ask questions and all were answered. The patient agreed with the plan and demonstrated an understanding of the instructions.   The patient was advised to call back or seek an in-person evaluation if the symptoms worsen or if the condition fails to improve as anticipated.  The above assessment and management plan was discussed with the patient. The patient verbalized understanding of and has agreed to the management plan. Patient is aware to call the clinic if symptoms persist or worsen. Patient is aware when to return to the clinic for a follow-up visit. Patient educated on when it is appropriate to go to the emergency department.   Time call ended:  13:25  I provided 11 minutes of  non face-to-face time during this encounter.    [name], FNP

## 2022-09-09 ENCOUNTER — Emergency Department (HOSPITAL_COMMUNITY)

## 2022-09-09 ENCOUNTER — Other Ambulatory Visit: Payer: Self-pay

## 2022-09-09 ENCOUNTER — Inpatient Hospital Stay (HOSPITAL_COMMUNITY)
Admission: EM | Admit: 2022-09-09 | Discharge: 2022-09-13 | DRG: 871 | Disposition: A | Discharge status: Hospice | Attending: Family Medicine | Admitting: Family Medicine

## 2022-09-09 ENCOUNTER — Encounter (HOSPITAL_COMMUNITY): Payer: Self-pay | Admitting: Emergency Medicine

## 2022-09-09 DIAGNOSIS — D509 Iron deficiency anemia, unspecified: Secondary | ICD-10-CM | POA: Diagnosis present

## 2022-09-09 DIAGNOSIS — M47819 Spondylosis without myelopathy or radiculopathy, site unspecified: Secondary | ICD-10-CM | POA: Diagnosis present

## 2022-09-09 DIAGNOSIS — J9611 Chronic respiratory failure with hypoxia: Secondary | ICD-10-CM | POA: Diagnosis present

## 2022-09-09 DIAGNOSIS — Z66 Do not resuscitate: Secondary | ICD-10-CM | POA: Diagnosis present

## 2022-09-09 DIAGNOSIS — Z1152 Encounter for screening for COVID-19: Secondary | ICD-10-CM

## 2022-09-09 DIAGNOSIS — I130 Hypertensive heart and chronic kidney disease with heart failure and stage 1 through stage 4 chronic kidney disease, or unspecified chronic kidney disease: Secondary | ICD-10-CM | POA: Diagnosis present

## 2022-09-09 DIAGNOSIS — K219 Gastro-esophageal reflux disease without esophagitis: Secondary | ICD-10-CM | POA: Diagnosis present

## 2022-09-09 DIAGNOSIS — R0902 Hypoxemia: Secondary | ICD-10-CM | POA: Diagnosis not present

## 2022-09-09 DIAGNOSIS — R0689 Other abnormalities of breathing: Secondary | ICD-10-CM | POA: Diagnosis not present

## 2022-09-09 DIAGNOSIS — Z7982 Long term (current) use of aspirin: Secondary | ICD-10-CM

## 2022-09-09 DIAGNOSIS — E890 Postprocedural hypothyroidism: Secondary | ICD-10-CM | POA: Diagnosis present

## 2022-09-09 DIAGNOSIS — I5033 Acute on chronic diastolic (congestive) heart failure: Secondary | ICD-10-CM | POA: Diagnosis present

## 2022-09-09 DIAGNOSIS — J440 Chronic obstructive pulmonary disease with acute lower respiratory infection: Secondary | ICD-10-CM | POA: Diagnosis present

## 2022-09-09 DIAGNOSIS — J690 Pneumonitis due to inhalation of food and vomit: Secondary | ICD-10-CM | POA: Diagnosis present

## 2022-09-09 DIAGNOSIS — D649 Anemia, unspecified: Secondary | ICD-10-CM | POA: Diagnosis present

## 2022-09-09 DIAGNOSIS — A419 Sepsis, unspecified organism: Principal | ICD-10-CM | POA: Diagnosis present

## 2022-09-09 DIAGNOSIS — J189 Pneumonia, unspecified organism: Secondary | ICD-10-CM | POA: Diagnosis present

## 2022-09-09 DIAGNOSIS — J9601 Acute respiratory failure with hypoxia: Secondary | ICD-10-CM | POA: Diagnosis not present

## 2022-09-09 DIAGNOSIS — E669 Obesity, unspecified: Secondary | ICD-10-CM | POA: Diagnosis present

## 2022-09-09 DIAGNOSIS — Z7189 Other specified counseling: Secondary | ICD-10-CM | POA: Diagnosis not present

## 2022-09-09 DIAGNOSIS — R0602 Shortness of breath: Secondary | ICD-10-CM | POA: Diagnosis not present

## 2022-09-09 DIAGNOSIS — L89323 Pressure ulcer of left buttock, stage 3: Secondary | ICD-10-CM | POA: Diagnosis present

## 2022-09-09 DIAGNOSIS — I499 Cardiac arrhythmia, unspecified: Secondary | ICD-10-CM | POA: Diagnosis not present

## 2022-09-09 DIAGNOSIS — R911 Solitary pulmonary nodule: Secondary | ICD-10-CM | POA: Diagnosis present

## 2022-09-09 DIAGNOSIS — I959 Hypotension, unspecified: Secondary | ICD-10-CM | POA: Diagnosis not present

## 2022-09-09 DIAGNOSIS — D631 Anemia in chronic kidney disease: Secondary | ICD-10-CM | POA: Diagnosis present

## 2022-09-09 DIAGNOSIS — J9621 Acute and chronic respiratory failure with hypoxia: Secondary | ICD-10-CM | POA: Diagnosis present

## 2022-09-09 DIAGNOSIS — N182 Chronic kidney disease, stage 2 (mild): Secondary | ICD-10-CM | POA: Diagnosis present

## 2022-09-09 DIAGNOSIS — J9600 Acute respiratory failure, unspecified whether with hypoxia or hypercapnia: Secondary | ICD-10-CM | POA: Diagnosis present

## 2022-09-09 DIAGNOSIS — Z6829 Body mass index (BMI) 29.0-29.9, adult: Secondary | ICD-10-CM

## 2022-09-09 DIAGNOSIS — R6889 Other general symptoms and signs: Secondary | ICD-10-CM | POA: Diagnosis not present

## 2022-09-09 DIAGNOSIS — Z515 Encounter for palliative care: Secondary | ICD-10-CM

## 2022-09-09 DIAGNOSIS — Z7401 Bed confinement status: Secondary | ICD-10-CM | POA: Diagnosis not present

## 2022-09-09 DIAGNOSIS — D72829 Elevated white blood cell count, unspecified: Secondary | ICD-10-CM | POA: Diagnosis present

## 2022-09-09 DIAGNOSIS — Z79899 Other long term (current) drug therapy: Secondary | ICD-10-CM

## 2022-09-09 DIAGNOSIS — E785 Hyperlipidemia, unspecified: Secondary | ICD-10-CM | POA: Diagnosis present

## 2022-09-09 DIAGNOSIS — F0390 Unspecified dementia without behavioral disturbance: Secondary | ICD-10-CM | POA: Diagnosis present

## 2022-09-09 DIAGNOSIS — Z8249 Family history of ischemic heart disease and other diseases of the circulatory system: Secondary | ICD-10-CM

## 2022-09-09 DIAGNOSIS — E860 Dehydration: Secondary | ICD-10-CM | POA: Diagnosis present

## 2022-09-09 DIAGNOSIS — I10 Essential (primary) hypertension: Secondary | ICD-10-CM | POA: Diagnosis not present

## 2022-09-09 DIAGNOSIS — R7881 Bacteremia: Secondary | ICD-10-CM | POA: Diagnosis present

## 2022-09-09 DIAGNOSIS — Z743 Need for continuous supervision: Secondary | ICD-10-CM | POA: Diagnosis not present

## 2022-09-09 DIAGNOSIS — R54 Age-related physical debility: Secondary | ICD-10-CM | POA: Diagnosis present

## 2022-09-09 DIAGNOSIS — R6520 Severe sepsis without septic shock: Secondary | ICD-10-CM | POA: Diagnosis present

## 2022-09-09 DIAGNOSIS — M8580 Other specified disorders of bone density and structure, unspecified site: Secondary | ICD-10-CM | POA: Diagnosis present

## 2022-09-09 DIAGNOSIS — N179 Acute kidney failure, unspecified: Secondary | ICD-10-CM | POA: Diagnosis present

## 2022-09-09 DIAGNOSIS — Z9981 Dependence on supplemental oxygen: Secondary | ICD-10-CM

## 2022-09-09 DIAGNOSIS — I5030 Unspecified diastolic (congestive) heart failure: Secondary | ICD-10-CM | POA: Diagnosis present

## 2022-09-09 LAB — URINALYSIS, ROUTINE W REFLEX MICROSCOPIC
Bilirubin Urine: NEGATIVE
Glucose, UA: NEGATIVE mg/dL
Hgb urine dipstick: NEGATIVE
Ketones, ur: NEGATIVE mg/dL
Nitrite: NEGATIVE
Protein, ur: 100 mg/dL — AB
Specific Gravity, Urine: 1.02 (ref 1.005–1.030)
WBC, UA: >50 WBC/hpf (ref 0–5)
pH: 5 (ref 5.0–8.0)

## 2022-09-09 LAB — BLOOD GAS, ARTERIAL
Acid-Base Excess: 14.3 mmol/L — ABNORMAL HIGH (ref 0.0–2.0)
Bicarbonate: 42.2 mmol/L — ABNORMAL HIGH (ref 20.0–28.0)
Drawn by: 23430
O2 Saturation: 100 %
Patient temperature: 37.3
pCO2 arterial: 74 mmHg (ref 32–48)
pH, Arterial: 7.37 (ref 7.35–7.45)
pO2, Arterial: 230 mmHg — ABNORMAL HIGH (ref 83–108)

## 2022-09-09 LAB — COMPREHENSIVE METABOLIC PANEL
ALT: 12 U/L (ref 0–44)
AST: 20 U/L (ref 15–41)
Albumin: 2.2 g/dL — ABNORMAL LOW (ref 3.5–5.0)
Alkaline Phosphatase: 75 U/L (ref 38–126)
Anion gap: 10 (ref 5–15)
BUN: 17 mg/dL (ref 8–23)
CO2: 32 mmol/L (ref 22–32)
Calcium: 7.4 mg/dL — ABNORMAL LOW (ref 8.9–10.3)
Chloride: 88 mmol/L — ABNORMAL LOW (ref 98–111)
Creatinine, Ser: 1.01 mg/dL — ABNORMAL HIGH (ref 0.44–1.00)
GFR, Estimated: 52 mL/min — ABNORMAL LOW (ref >60)
Glucose, Bld: 136 mg/dL — ABNORMAL HIGH (ref 70–99)
Potassium: 3.1 mmol/L — ABNORMAL LOW (ref 3.5–5.1)
Sodium: 130 mmol/L — ABNORMAL LOW (ref 135–145)
Total Bilirubin: 0.3 mg/dL (ref 0.3–1.2)
Total Protein: 7.1 g/dL (ref 6.5–8.1)

## 2022-09-09 LAB — LACTIC ACID, PLASMA
Lactic Acid, Venous: 1.3 mmol/L (ref 0.5–1.9)
Lactic Acid, Venous: 1 mmol/L (ref 0.5–1.9)

## 2022-09-09 LAB — RESP PANEL BY RT-PCR (RSV, FLU A&B, COVID)  RVPGX2
Influenza A by PCR: NEGATIVE
Influenza B by PCR: NEGATIVE
Resp Syncytial Virus by PCR: NEGATIVE
SARS Coronavirus 2 by RT PCR: NEGATIVE

## 2022-09-09 LAB — TROPONIN I (HIGH SENSITIVITY)
Troponin I (High Sensitivity): 9 ng/L (ref <18)
Troponin I (High Sensitivity): 12 ng/L (ref <18)

## 2022-09-09 LAB — CBC WITH DIFFERENTIAL/PLATELET
Abs Immature Granulocytes: 0.21 10*3/uL — ABNORMAL HIGH (ref 0.00–0.07)
Basophils Absolute: 0.1 10*3/uL (ref 0.0–0.1)
Basophils Relative: 1 %
Eosinophils Absolute: 0.1 10*3/uL (ref 0.0–0.5)
Eosinophils Relative: 0 %
HCT: 31.1 % — ABNORMAL LOW (ref 36.0–46.0)
Hemoglobin: 10.1 g/dL — ABNORMAL LOW (ref 12.0–15.0)
Immature Granulocytes: 1 %
Lymphocytes Relative: 8 %
Lymphs Abs: 1.3 10*3/uL (ref 0.7–4.0)
MCH: 32 pg (ref 26.0–34.0)
MCHC: 32.5 g/dL (ref 30.0–36.0)
MCV: 98.4 fL (ref 80.0–100.0)
Monocytes Absolute: 1 10*3/uL (ref 0.1–1.0)
Monocytes Relative: 6 %
Neutro Abs: 13.9 10*3/uL — ABNORMAL HIGH (ref 1.7–7.7)
Neutrophils Relative %: 84 %
Platelets: 392 10*3/uL (ref 150–400)
RBC: 3.16 MIL/uL — ABNORMAL LOW (ref 3.87–5.11)
RDW: 12.6 % (ref 11.5–15.5)
WBC: 16.5 10*3/uL — ABNORMAL HIGH (ref 4.0–10.5)
nRBC: 0 % (ref 0.0–0.2)

## 2022-09-09 LAB — BRAIN NATRIURETIC PEPTIDE: B Natriuretic Peptide: 284 pg/mL — ABNORMAL HIGH (ref 0.0–100.0)

## 2022-09-09 LAB — PROTIME-INR
INR: 1.2 (ref 0.8–1.2)
Prothrombin Time: 15.4 seconds — ABNORMAL HIGH (ref 11.4–15.2)

## 2022-09-09 LAB — LIPASE, BLOOD: Lipase: 24 U/L (ref 11–51)

## 2022-09-09 LAB — MAGNESIUM: Magnesium: 2.3 mg/dL (ref 1.7–2.4)

## 2022-09-09 LAB — PROCALCITONIN: Procalcitonin: 5.73 ng/mL

## 2022-09-09 LAB — PHOSPHORUS: Phosphorus: 3.2 mg/dL (ref 2.5–4.6)

## 2022-09-09 MED ORDER — SALINE SPRAY 0.65 % NA SOLN: 1.0000 | Freq: Every day | NASAL | Status: DC | PRN

## 2022-09-09 MED ORDER — METHYLPREDNISOLONE SODIUM SUCC 40 MG IJ SOLR
40.0000 mg | Freq: Two times a day (BID) | INTRAMUSCULAR | Status: DC
  Administered 2022-09-09 – 2022-09-13 (×8): 40 mg via INTRAVENOUS
  Filled 2022-09-09 (×8): qty 1

## 2022-09-09 MED ORDER — UMECLIDINIUM BROMIDE 62.5 MCG/ACT IN AEPB
1.0000 | INHALATION_SPRAY | Freq: Every day | RESPIRATORY_TRACT | Status: DC
  Administered 2022-09-12 – 2022-09-13 (×2): 1 via RESPIRATORY_TRACT
  Filled 2022-09-09 (×2): qty 7

## 2022-09-09 MED ORDER — DM-GUAIFENESIN ER 30-600 MG PO TB12
1.0000 | ORAL_TABLET | Freq: Two times a day (BID) | ORAL | Status: DC
  Administered 2022-09-09 – 2022-09-13 (×7): 1 via ORAL
  Filled 2022-09-09 (×7): qty 1

## 2022-09-09 MED ORDER — SODIUM CHLORIDE 0.9 % IV SOLN: 500.0000 mg | INTRAVENOUS | Status: DC

## 2022-09-09 MED ORDER — FLEET ENEMA 7-19 GM/118ML RE ENEM: 1.0000 | ENEMA | Freq: Once | RECTAL | Status: DC | PRN

## 2022-09-09 MED ORDER — OXYCODONE HCL 5 MG PO TABS: 5.0000 mg | ORAL_TABLET | ORAL | Status: DC | PRN

## 2022-09-09 MED ORDER — PIPERACILLIN-TAZOBACTAM 3.375 G IVPB 30 MIN
3.3750 g | Freq: Once | INTRAVENOUS | Status: AC
  Administered 2022-09-09: 3.375 g via INTRAVENOUS
  Filled 2022-09-09: qty 50

## 2022-09-09 MED ORDER — RISAQUAD PO CAPS
1.0000 | ORAL_CAPSULE | Freq: Every day | ORAL | Status: DC
  Administered 2022-09-09 – 2022-09-13 (×4): 1 via ORAL
  Filled 2022-09-09 (×4): qty 1

## 2022-09-09 MED ORDER — ACETAMINOPHEN 650 MG RE SUPP: 650.0000 mg | Freq: Four times a day (QID) | RECTAL | Status: DC | PRN

## 2022-09-09 MED ORDER — DONEPEZIL HCL 5 MG PO TABS
5.0000 mg | ORAL_TABLET | Freq: Every day | ORAL | Status: DC
  Administered 2022-09-09 – 2022-09-12 (×4): 5 mg via ORAL
  Filled 2022-09-09 (×4): qty 1

## 2022-09-09 MED ORDER — SODIUM CHLORIDE 0.9% FLUSH: 3.0000 mL | INTRAVENOUS | Status: DC | PRN

## 2022-09-09 MED ORDER — SODIUM CHLORIDE 0.9 % IV SOLN: INTRAVENOUS | Status: DC

## 2022-09-09 MED ORDER — SODIUM CHLORIDE 0.9 % IV SOLN
500.0000 mg | INTRAVENOUS | Status: DC
  Administered 2022-09-09: 500 mg via INTRAVENOUS
  Filled 2022-09-09: qty 5

## 2022-09-09 MED ORDER — BACID PO TABS
2.0000 | ORAL_TABLET | Freq: Three times a day (TID) | ORAL | Status: DC
  Filled 2022-09-09 (×6): qty 2

## 2022-09-09 MED ORDER — ENSURE ENLIVE PO LIQD
237.0000 mL | Freq: Two times a day (BID) | ORAL | Status: DC
  Administered 2022-09-12 – 2022-09-13 (×2): 237 mL via ORAL
  Filled 2022-09-09 (×6): qty 237

## 2022-09-09 MED ORDER — SODIUM CHLORIDE 0.9% FLUSH
3.0000 mL | Freq: Two times a day (BID) | INTRAVENOUS | Status: DC
  Administered 2022-09-09 – 2022-09-13 (×7): 3 mL via INTRAVENOUS

## 2022-09-09 MED ORDER — IPRATROPIUM-ALBUTEROL 0.5-2.5 (3) MG/3ML IN SOLN
3.0000 mL | Freq: Once | RESPIRATORY_TRACT | Status: AC
  Administered 2022-09-09: 3 mL via RESPIRATORY_TRACT
  Filled 2022-09-09: qty 3

## 2022-09-09 MED ORDER — LEVALBUTEROL HCL 0.63 MG/3ML IN NEBU: 0.6300 mg | INHALATION_SOLUTION | Freq: Four times a day (QID) | RESPIRATORY_TRACT | Status: DC | PRN

## 2022-09-09 MED ORDER — BISACODYL 5 MG PO TBEC: 5.0000 mg | DELAYED_RELEASE_TABLET | Freq: Every day | ORAL | Status: DC | PRN

## 2022-09-09 MED ORDER — TRAZODONE HCL 50 MG PO TABS: 25.0000 mg | ORAL_TABLET | Freq: Every evening | ORAL | Status: DC | PRN

## 2022-09-09 MED ORDER — HYDROMORPHONE HCL 1 MG/ML IJ SOLN: 0.5000 mg | INTRAMUSCULAR | Status: DC | PRN

## 2022-09-09 MED ORDER — SODIUM CHLORIDE 0.9 % IV SOLN: 250.0000 mL | INTRAVENOUS | Status: DC | PRN

## 2022-09-09 MED ORDER — ONDANSETRON HCL 4 MG PO TABS: 4.0000 mg | ORAL_TABLET | Freq: Four times a day (QID) | ORAL | Status: DC | PRN

## 2022-09-09 MED ORDER — FUROSEMIDE 10 MG/ML IJ SOLN: 40.0000 mg | Freq: Two times a day (BID) | INTRAMUSCULAR | Status: DC

## 2022-09-09 MED ORDER — AZELASTINE HCL 0.1 % NA SOLN
2.0000 | Freq: Two times a day (BID) | NASAL | Status: DC
  Administered 2022-09-10 – 2022-09-12 (×5): 2 via NASAL
  Filled 2022-09-09 (×2): qty 30

## 2022-09-09 MED ORDER — ACETAMINOPHEN 325 MG PO TABS: 650.0000 mg | ORAL_TABLET | Freq: Four times a day (QID) | ORAL | Status: DC | PRN

## 2022-09-09 MED ORDER — SODIUM CHLORIDE 0.9 % IV SOLN
1.0000 g | Freq: Once | INTRAVENOUS | Status: AC
  Administered 2022-09-09: 1 g via INTRAVENOUS
  Filled 2022-09-09: qty 10

## 2022-09-09 MED ORDER — IPRATROPIUM BROMIDE 0.02 % IN SOLN: 0.5000 mg | Freq: Four times a day (QID) | RESPIRATORY_TRACT | Status: DC | PRN

## 2022-09-09 MED ORDER — PIPERACILLIN-TAZOBACTAM 3.375 G IVPB
3.3750 g | Freq: Three times a day (TID) | INTRAVENOUS | Status: DC
  Administered 2022-09-09 – 2022-09-13 (×10): 3.375 g via INTRAVENOUS
  Filled 2022-09-09 (×11): qty 50

## 2022-09-09 MED ORDER — GLYCOPYRROLATE 1 MG PO TABS
1.0000 mg | ORAL_TABLET | Freq: Two times a day (BID) | ORAL | Status: DC
  Administered 2022-09-09 – 2022-09-13 (×7): 1 mg via ORAL
  Filled 2022-09-09 (×11): qty 1

## 2022-09-09 MED ORDER — HYDRALAZINE HCL 20 MG/ML IJ SOLN: 10.0000 mg | INTRAMUSCULAR | Status: DC | PRN

## 2022-09-09 MED ORDER — POTASSIUM CHLORIDE CRYS ER 20 MEQ PO TBCR
40.0000 meq | EXTENDED_RELEASE_TABLET | Freq: Two times a day (BID) | ORAL | Status: DC
  Administered 2022-09-09: 40 meq via ORAL
  Filled 2022-09-09: qty 2

## 2022-09-09 MED ORDER — SENNOSIDES-DOCUSATE SODIUM 8.6-50 MG PO TABS: 1.0000 | ORAL_TABLET | Freq: Every evening | ORAL | Status: DC | PRN

## 2022-09-09 MED ORDER — SODIUM CHLORIDE 0.9 % IV SOLN: 2.0000 g | INTRAVENOUS | Status: DC

## 2022-09-09 MED ORDER — FLUTICASONE FUROATE-VILANTEROL 100-25 MCG/ACT IN AEPB
1.0000 | INHALATION_SPRAY | Freq: Every day | RESPIRATORY_TRACT | Status: DC
  Administered 2022-09-12 – 2022-09-13 (×2): 1 via RESPIRATORY_TRACT
  Filled 2022-09-09 (×2): qty 28

## 2022-09-09 MED ORDER — ONDANSETRON HCL 4 MG/2ML IJ SOLN: 4.0000 mg | Freq: Four times a day (QID) | INTRAMUSCULAR | Status: DC | PRN

## 2022-09-09 MED ORDER — FUROSEMIDE 10 MG/ML IJ SOLN
60.0000 mg | Freq: Two times a day (BID) | INTRAMUSCULAR | Status: DC
  Administered 2022-09-09 – 2022-09-11 (×4): 60 mg via INTRAVENOUS
  Filled 2022-09-09 (×4): qty 6

## 2022-09-09 MED ORDER — SODIUM CHLORIDE 0.9% FLUSH: 3.0000 mL | Freq: Two times a day (BID) | INTRAVENOUS | Status: DC

## 2022-09-09 MED ORDER — HEPARIN SODIUM (PORCINE) 5000 UNIT/ML IJ SOLN
5000.0000 [IU] | Freq: Three times a day (TID) | INTRAMUSCULAR | Status: DC
  Administered 2022-09-09 – 2022-09-13 (×12): 5000 [IU] via SUBCUTANEOUS
  Filled 2022-09-09 (×12): qty 1

## 2022-09-09 MED ORDER — VITAMIN C 500 MG PO TABS
500.0000 mg | ORAL_TABLET | Freq: Every day | ORAL | Status: DC
  Administered 2022-09-09 – 2022-09-13 (×4): 500 mg via ORAL
  Filled 2022-09-09 (×4): qty 1

## 2022-09-09 NOTE — Assessment & Plan Note (Signed)
-   Holding home medication include losartan -BP remained stable

## 2022-09-09 NOTE — Assessment & Plan Note (Signed)
-   Chronic follow-up with outpatient -Per patient's son POA patient has been on the hospice

## 2022-09-09 NOTE — Assessment & Plan Note (Addendum)
BUN/creatinine at baseline -Monitoring, avoiding nephrotoxins Lab Results  Component Value Date   CREATININE 1.13 (H) 09/10/2022   CREATININE 1.01 (H) 09/09/2022   CREATININE 0.76 11/06/2021

## 2022-09-09 NOTE — Assessment & Plan Note (Signed)
-   Resuming gentle IV fluid hydration

## 2022-09-09 NOTE — Assessment & Plan Note (Addendum)
History of anemia of chronic disease, and iron deficiency anemia,  Continue supplement iron,  currently H&H stable Monitoring

## 2022-09-09 NOTE — Consult Note (Signed)
Consultation Note Date: 09/09/2022   Patient Name: Carrie Crawford  DOB: 07/30/1925  MRN: [id number]  Age / Sex: 89 y.o., female  PCP: [name], FNP Referring Physician: Deatra James, MD  Reason for Consultation: Establishing goals of care and Hospice Evaluation  HPI/Patient Profile: 89 y.o. female  with past medical history of dementia, bedbound, HTN/HLD, COPD-3L, dHF, HF, obesity, active with hospice of [facility] admitted on 09/09/2022 with aspiration pneumonia..   Clinical Assessment and Goals of Care: I have reviewed medical records including EPIC notes, labs and imaging, received report from RN, assessed the patient.  Carrie Crawford is lying quietly on the stretcher in the ED.  She appears acutely/chronically ill and very frail, obese.  She is resting comfortably, but wakes when I call her name.  She will make an somewhat keep eye contact.  She has known dementia but is able to tell me her name.  She has pinpoint pupils.  Call to son/HCPOA, [name], to discuss diagnosis prognosis, GOC, EOL wishes, disposition and options.  I introduced Palliative Medicine as specialized medical care for people living with serious illness. It focuses on providing relief from the symptoms and stress of a serious illness. The goal is to improve quality of life for both the patient and the family.  We discussed a brief life review of the patient.  [place] care since March 2023.  [name] states that she had been "doing well".  She went for hearing aids and then took a downturn last week.  States that she does recognize family.   We then focused on their current illness. We talk about PNE and the treatment plan.  [name] asks for time for outcomes.  The natural disease trajectory and expectations at EOL were discussed.  Advanced directives, concepts specific to code status, artifical feeding and  hydration, and rehospitalization were considered and discussed.  DNR verified  Hospice services outpatient were explained and offered.  Carrie Crawford has been active with hospice of [facility] since March 2023.  Discussed the importance of continued conversation with family and the medical providers regarding overall plan of care and treatment options, ensuring decisions are within the context of the patient's values and GOCs.  Questions and concerns were addressed.   The family was encouraged to call with questions or concerns.  PMT will continue to support holistically.  Conference with attending, bedside nursing staff, transition of care team related to patient condition, needs, goals of care, disposition.    HCPOA  NEXT OF KIN - [name] tells me that he is the primary decision maker.  Carrie Crawford has another son, Bobby's brother, who is a retired Engineer, drilling, but lives remotely.  [name] keeps his brother informed.   "You don't need his name".     SUMMARY OF RECOMMENDATIONS   At this point continue to treat the treatable but no CPR or intubation Time for outcomes Continue with hospice care once discharged   Code Status/Advance Care Planning: DNR  Symptom Management:  Per hospitalist,  no additional needs at this time.  Palliative Prophylaxis:  No additional needs at this time  Additional Recommendations (Limitations, Scope, Preferences): Continue to treat, time for outcomes  Psycho-social/Spiritual:  Desire for further Chaplaincy support:no Additional Recommendations: Caregiving  Support/Resources and Education on Hospice  Prognosis:  Unable to determine, based on outcomes.  Guarded.  In hospital death would not be surprising  Discharge Planning: To be determined      Primary Diagnoses: Present on Admission:  Hypoxia  Acute respiratory failure with hypoxia (HCC)  Anemia  Bacteremia  Chronic kidney disease, stage 2 (mild)  (Resolved) Chronic respiratory failure with  hypoxia (HCC)  Dehydration  Dementia without behavioral disturbance (HCC)  Diastolic heart failure (HCC)  Essential hypertension  GERD (gastroesophageal reflux disease)  Hyperlipidemia with target LDL less than 130  Leukocytosis  Multifocal pneumonia  Pulmonary nodule  Sepsis ([place])   I have reviewed the medical record, interviewed the patient and family, and examined the patient. The following aspects are pertinent.  Past Medical History:  Diagnosis Date   Arthritis    "spine" (03/16/2018)   CHF (congestive heart failure) (HCC)    Chronic bronchitis (HCC)    Chronic kidney disease    Compression fracture of lumbar vertebra (HCC)    COPD (chronic obstructive pulmonary disease) (HCC)    Dementia (HCC)    Dyslipidemia    Hypertension    Lung nodule 10/06/2021   Multiple allergies    Osteopenia    Pneumonia    Rhinitis, chronic    Social History   Socioeconomic History   Marital status: Widowed    Spouse name: Not on file   Number of children: Not on file   Years of education: Not on file   Highest education level: Not on file  Occupational History   Not on file  Tobacco Use   Smoking status: Never   Smokeless tobacco: Never  Vaping Use   Vaping Use: Never used  Substance and Sexual Activity   Alcohol use: No    Alcohol/week: 0.0 standard drinks of alcohol   Drug use: No   Sexual activity: Not Currently  Other Topics Concern   Not on file  Social History Narrative   Not on file   Social Determinants of Health   Financial Resource Strain: Not on file  Food Insecurity: No Food Insecurity (11/29/2020)   Hunger Vital Sign    Worried About Running Out of Food in the Last Year: Never true    Ran Out of Food in the Last Year: Never true  Transportation Needs: No Transportation Needs (11/29/2020)   PRAPARE - Hydrologist (Medical): No    Lack of Transportation (Non-Medical): No  Physical Activity: Not on file  Stress: Not on file   Social Connections: Socially Isolated (11/29/2020)   Social Connection and Isolation Panel [NHANES]    Frequency of Communication with Friends and Family: Never    Frequency of Social Gatherings with Friends and Family: More than three times a week    Attends Religious Services: Never    Marine scientist or Organizations: No    Attends Archivist Meetings: Never    Marital Status: Widowed   Family History  Problem Relation Age of Onset   Heart disease Brother    Cancer Daughter        endometial/uterine cancer(nodule in right lung)   Scheduled Meds:  acidophilus  1 capsule Oral Daily   ascorbic acid  500  mg Oral Daily   azelastine  2 spray Each Nare BID   dextromethorphan-guaiFENesin  1 tablet Oral BID   donepezil  5 mg Oral QHS   [START ON 09/10/2022] feeding supplement  237 mL Oral BID BM   fluticasone furoate-vilanterol  1 puff Inhalation Daily   And   umeclidinium bromide  1 puff Inhalation Daily   heparin  5,000 Units Subcutaneous Q8H   sodium chloride flush  3 mL Intravenous Q12H   Continuous Infusions:  sodium chloride 100 mL/hr at 09/09/22 1501   piperacillin-tazobactam 3.375 g (09/09/22 1537)   [START ON 09/10/2022] piperacillin-tazobactam (ZOSYN)  IV     PRN Meds:.acetaminophen **OR** acetaminophen, bisacodyl, hydrALAZINE, HYDROmorphone (DILAUDID) injection, ipratropium, levalbuterol, ondansetron **OR** ondansetron (ZOFRAN) IV, oxyCODONE, senna-docusate, sodium chloride, sodium phosphate, traZODone Medications Prior to Admission:  Prior to Admission medications   Medication Sig Start Date End Date Taking? Authorizing Provider  albuterol (PROVENTIL) (2.5 MG/3ML) 0.083% nebulizer solution NEBULIZE 1 VIAL EVERY 4 HOURS AS NEEDED SHORTNESS OF BREATH 06/26/22   [name], FNP  ascorbic acid (VITAMIN C) 500 MG tablet Take 1 tablet (500 mg total) by mouth daily. 07/19/21   Barton Dubois, MD  aspirin 81 MG tablet Take 81 mg by mouth daily.    [provider]  azelastine (ASTELIN) 0.1 % nasal spray 2 sprays in each nostril at bedtime. 08/14/21   Hassell Done Mary-Margaret, FNP  Cyanocobalamin 1000 MCG CAPS Take 1,000 mcg by mouth daily.    [provider]  dextromethorphan-guaiFENesin (MUCINEX DM) 30-600 MG 12hr tablet Take 1 tablet by mouth 2 (two) times daily. 07/18/21   Barton Dubois, MD  donepezil (ARICEPT) 5 MG tablet Take 1 tablet (5 mg total) by mouth at bedtime. 02/18/22   [name], FNP  feeding supplement (ENSURE ENLIVE / ENSURE PLUS) LIQD Take 237 mLs by mouth 2 (two) times daily between meals. Patient taking differently: Take 237 mLs by mouth daily as needed (Snack). 10/09/21   Manuella Ghazi, Pratik D, DO  Ferrous Sulfate (IRON PO) Take 65 mg by mouth daily.    [provider]  furosemide (LASIX) 40 MG tablet Take 1 tablet (40 mg total) by mouth daily. (NEEDS TO BE SEEN BEFORE NEXT REFILL) 07/21/22   [name], FNP  losartan (COZAAR) 100 MG tablet TAKE ONE TABLET AT BEDTIME Patient taking differently: Take 100 mg by mouth at bedtime. 05/19/21   Evelina Dun A, FNP  MULTIPLE VITAMIN PO Take 1 tablet by mouth daily.    [provider]  nystatin cream (MYCOSTATIN) Apply 1 application topically 2 (two) times daily. Patient taking differently: Apply 1 application  topically daily as needed (vaginal yeast). 08/01/21   Hassell Done, Mary-Margaret, FNP  OXYGEN Inhale 1.5 L into the lungs continuous. Per patient's son liters 1 1/2 now, at home 02 liters 1 1/4    [provider]  pantoprazole (PROTONIX) 40 MG tablet TAKE ONE TABLET BY MOUTH DAILY Patient not taking: Reported on 03/05/2022 10/13/21   [name], FNP  potassium chloride (MICRO-K) 10 MEQ CR capsule Take 4 capsules (40 mEq total) by mouth 2 (two) times daily. (NEEDS TO BE SEEN BEFORE NEXT REFILL) Patient not taking: Reported on 03/24/2022 10/09/21 11/08/21  Heath Lark D, DO  Probiotic Product (PROBIOTIC PO) Take 1 tablet by mouth daily.     [provider]  Simethicone (GAS RELIEF PO) Take 125 mg by mouth at bedtime.    [provider]  sodium chloride (OCEAN) 0.65 %  SOLN nasal spray Place 1 spray into both nostrils daily as needed for congestion.    [provider]  Donnal Debar 100-62.5-25 MCG/ACT AEPB INHALE 1 PUFF DAILY AS DIRECTED 04/30/22   [name], FNP   No Known Allergies Review of Systems  Unable to perform ROS: Acuity of condition    Physical Exam Vitals and nursing note reviewed.     Vital Signs: BP (!) 115/48   Pulse 87   Temp 98.1 F (36.7 C) (Oral)   Resp (!) 24   Ht '5\' 4"'$  (1.626 m)   Wt 87.1 kg   SpO2 100%   BMI 32.96 kg/m  Pain Scale: PAINAD       SpO2: SpO2: 100 % O2 Device:SpO2: 100 % O2 Flow Rate: .O2 Flow Rate (L/min): 15 L/min  IO: Intake/output summary: No intake or output data in the 24 hours ending 09/09/22 1556  LBM:   Baseline Weight: Weight: 87.1 kg Most recent weight: Weight: 87.1 kg     Palliative Assessment/Data:     Time In: 1530 Time Out: 1615 Time Total: 75 minutes  Greater than 50%  of this time was spent counseling and coordinating care related to the above assessment and plan.  Signed by: Drue Novel, NP   Please contact Palliative Medicine Team phone at [phone] for questions and concerns.  For individual provider: See Shea Evans

## 2022-09-09 NOTE — Assessment & Plan Note (Signed)
-  History of bacteremia approximately year ago will follow the blood cultures -Will treat accordingly

## 2022-09-09 NOTE — Assessment & Plan Note (Signed)
-   Stable with no behavior exacerbation, continue home medication of Aricept

## 2022-09-09 NOTE — ED Notes (Signed)
Patient oxygen saturation dropped to 82% nasal cannula on 4 L of oxygen. Patient bumped to 6L with no changes. Patient placed on non-rebreather at 15L and oxygen saturation increased to 100%. EDP notified and states to leave patient on non-rebreather at this time.

## 2022-09-09 NOTE — Assessment & Plan Note (Addendum)
-   Obtunded this morning Blood pressure (!) 113/54, pulse 95, temperature 98.3 F (36.8 C), RR (!) 23, weight 87.1 kg, SpO2 96 %.  On 5 L of oxygen (down from 15 L-Venturi mask)  - POA; met sepsis criteria with acute on chronic respiratory failure -WBC of 16.5 >>>> 10.2 -Lactic acid 1.3, 1.0 -Procalcitonin 5.73 -BNP 284.0 -Chest x-ray - 1. Interval increase in/new patchy airspace opacity in the right lung base, suspicious for infection. 2. Unchanged appearance of the left lung base with a possible layering left-sided pleural effusion. 3. Previously seen nodular opacity in the left upper lung is not visualized on this exam,   Therefore likely source of infection pneumonia versus aspiration pneumonia -Patient darted on broad-spectrum antibiotics with cefepime and azithromycin, switched to IV Zosyn due to possibly aspiration pneumonia -Follow-up with blood and urine culture  -Continue supportive therapy with acute respiratory failure treating underlying causes POA son confirmed DNR/DNI status

## 2022-09-09 NOTE — Assessment & Plan Note (Signed)
Acute on chronic likely due to pneumonia -Down to 5 L of oxygen from 15 satting 96%

## 2022-09-09 NOTE — ED Notes (Signed)
Pt's oxygen changed from NRB to nasal canula at 6 liters of oxygen

## 2022-09-09 NOTE — ED Notes (Signed)
Pt sats 81 % on RA. Pt placed on 15L NRB. Sats now 100%. MD aware

## 2022-09-09 NOTE — Assessment & Plan Note (Signed)
-  Baseline O2 demand 1.5 L -Subsequently required up to 15 L of oxygen, via Ventimask,>>> much improved now on 5 L of oxygen, satting 96%  -Likely source underlying pneumonia versus aspiration pneumonia -On empiric antibiotics--continue IV antibiotics -POA son has been contacted confirmed DNR/DNI status, -Continue supportive care -DuoNeb bronchodilators, IV ABX

## 2022-09-09 NOTE — ED Notes (Signed)
Pt sats 84% on nasal canula 6 liters Pt put back on non rebreather

## 2022-09-09 NOTE — Assessment & Plan Note (Signed)
Likely due to pneumonia, continue antibiotics

## 2022-09-09 NOTE — Hospital Course (Signed)
Carrie Crawford is a 89 year old female who is presenting from home presumably has been under hospice for the past year extensive history of chronic respiratory failure on 1.5 L liters of continuous oxygen, lung nodules, COPD, HTN, HLD, CHF.Marland Kitchen  Arthritis, CKD, dementia, osteopenia, .... Presented to ED with acute on chronic respiratory failure, hypoxia.  Per patient's son Mr. [name] she is starting feeling ill, with congestion cough since last Tuesday with shortness of breath cough, despite treating with Zithromycin progressively gotten worse in past 24-36 hours.  He has increased her oxygen from 1.5 to 4/5 L of oxygen still remain hypoxic.   POA Mr. [name] she has had pneumonia, hypoxia, lung nodule presumed cancer last year where she was discharged under hospice care.  Since then she has been stable doing well at home until this episode   ED course: Blood pressure (!) 123/46, pulse 93, temperature 98.1 F (36.7 C), temperature source Oral, resp. rate (!) 24, height '5\' 4"'$  (1.626 m), weight 87.1 kg, SpO2 100 %.  15 L of oxygen   CMP sodium 130 potassium 3.1 chloride 88, BUN 17, creatinine 1.01, calcium 7.4, GFR 52, BNP 284, troponin 9, lactic acid 1.3, 1.0, WBC 16.5, hemoglobin 10.1,  Respiratory panel including influenza A/B, SARS-CoV-2 all negative Chest x-ray-SCD on the right consistent with possible pneumonia   Was asked to admit acute respiratory failure likely pneumonia --POA has agreed for empiric antibiotics, supportive therapy, remains DNR DNI

## 2022-09-09 NOTE — ED Provider Notes (Signed)
Emergency Department Provider Note   I have reviewed the triage vital signs and the nursing notes.   HISTORY  Chief Complaint Shortness of Breath   HPI Carrie Crawford is a 89 y.o. female past history of COPD, hypertension, hyperlipidemia, congestive heart failure, reportedly on hospice, presents emergency department by EMS with ingestion and cough.  She has been on azithromycin for the last 24 to 36 hours but seeming more congested to her family.  She arrives hypoxemic.  Few complaints although with history of dementia level 5 caveat does apply. Denies active pain.   I discussed the case with the patient son and caregiver at bedside.  He confirms that she is on hospice but has been in this program for the past 11 months.  She has been doing well at home with hospice nurse care and other caregivers.  She started azithromycin on Sunday evening with some congestion.  He is having similar congestion symptoms.  Her breathing is worsened over the past 24 hours which prompted the EMS call. Confirms patient is DNR/DNI.   Past Medical History:  Diagnosis Date   Arthritis    "spine" (03/16/2018)   CHF (congestive heart failure) (HCC)    Chronic bronchitis (HCC)    Chronic kidney disease    Compression fracture of lumbar vertebra (HCC)    COPD (chronic obstructive pulmonary disease) (HCC)    Dementia (HCC)    Dyslipidemia    Hypertension    Lung nodule 10/06/2021   Multiple allergies    Osteopenia    Pneumonia    Rhinitis, chronic     Review of Systems {** Revise as appropriate then delete this line - Documentation of 10 systems OR 2 systems and "10-point ROS otherwise negative" is required **}Constitutional: No fever/chills Eyes: No visual changes. ENT: No sore throat. Cardiovascular: Denies chest pain. Respiratory: Denies shortness of breath. Gastrointestinal: No abdominal pain.  No nausea, no vomiting.  No diarrhea.  No constipation. Genitourinary: Negative for  dysuria. Musculoskeletal: Negative for back pain. Skin: Negative for rash. Neurological: Negative for headaches, focal weakness or numbness. {**Psychiatric:  Endocrine:  Hematological/Lymphatic:  Allergic/Immunilogical: **}  ____________________________________________   PHYSICAL EXAM:  VITAL SIGNS: ED Triage Vitals  Enc Vitals Group     BP --      Pulse --      Resp --      Temp --      Temp src --      SpO2 09/09/22 0951 (!) 81 %     Weight 09/09/22 0955 192 lb (87.1 kg)     Height 09/09/22 0955 '5\' 4"'$  (1.626 m)     Head Circumference --      Peak Flow --      Pain Score --      Pain Loc --      Pain Edu? --      Excl. in [place]? --    {** Revise as appropriate then delete this line - 8 systems required **} Constitutional: Alert and oriented. Well appearing and in no acute distress. Eyes: Conjunctivae are normal. PERRL. EOMI. Head: Atraumatic. {**Ears:  Healthy appearing ear canals and TMs bilaterally **}Nose: No congestion/rhinnorhea. Mouth/Throat: Mucous membranes are moist.  Oropharynx non-erythematous. Neck: No stridor.  No meningeal signs.  {**No cervical spine tenderness to palpation.**} Cardiovascular: Normal rate, regular rhythm. Good peripheral circulation. Grossly normal heart sounds.   Respiratory: Normal respiratory effort.  No retractions. Lungs CTAB. Gastrointestinal: Soft and nontender. No distention.  {**Genitourinary:  **}  Musculoskeletal: No lower extremity tenderness nor edema. No gross deformities of extremities. Neurologic:  Normal speech and language. No gross focal neurologic deficits are appreciated.  Skin:  Skin is warm, dry and intact. No rash noted. {**Psychiatric: Mood and affect are normal. Speech and behavior are normal.**}  ____________________________________________   LABS (all labs ordered are listed, but only abnormal results are displayed)  Labs Reviewed  RESP PANEL BY RT-PCR (RSV, FLU A&B, COVID)  [id number]  COMPREHENSIVE METABOLIC  PANEL  BRAIN NATRIURETIC PEPTIDE  CBC WITH DIFFERENTIAL/PLATELET  LIPASE, BLOOD  LACTIC ACID, PLASMA  LACTIC ACID, PLASMA  URINALYSIS, ROUTINE W REFLEX MICROSCOPIC  TROPONIN I (HIGH SENSITIVITY)   ____________________________________________  EKG  *** ____________________________________________  RADIOLOGY  DG Chest Portable 1 View  Result Date: 09/09/2022 CLINICAL DATA:  Shortness of breath EXAM: PORTABLE CHEST 1 VIEW COMPARISON:  CXR 10/04/21, CT CChest 10/06/21 FINDINGS: Unchanged appearance of the left lung base with a possible layering left-sided pleural effusion. Compared to prior exam there is interval increase in/new patchy airspace opacities in the right lung base, which is suspicious for infection. No pneumothorax. Unchanged cardiac and mediastinal contours. Visualized upper abdomen is unremarkable. No radiographically apparent displaced rib fractures. Degenerative changes of the right AC joint. Previously seen nodular opacity in the left upper lung is not visualized on this exam. IMPRESSION: 1. Interval increase in/new patchy airspace opacity in the right lung base, suspicious for infection. 2. Unchanged appearance of the left lung base with a possible layering left-sided pleural effusion. 3. Previously seen nodular opacity in the left upper lung is not visualized on this exam, likely due to xray technique. Electronically Signed   By: Marin Roberts M.D.   On: 09/09/2022 09:57    ____________________________________________   PROCEDURES  Procedure(s) performed:   Procedures   ____________________________________________   INITIAL IMPRESSION / ASSESSMENT AND PLAN / ED COURSE  Pertinent labs & imaging results that were available during my care of the patient were reviewed by me and considered in my medical decision making (see chart for details).   This patient is Presenting for Evaluation of ***, which {Range:23949} require a range of treatment options, and  {MDMcomplaint:23950} a complaint that involves a {MDMlevelrisk:23951} risk of morbidity and mortality.  The Differential Diagnoses include***.  Critical Interventions-    Medications  ipratropium-albuterol (DUONEB) 0.5-2.5 (3) MG/3ML nebulizer solution 3 mL (has no administration in time range)    Reassessment after intervention:     I *** Additional Historical Information from ***, as the patient is ***.  I decided to review pertinent External Data, and in summary ***.   Clinical Laboratory Tests Ordered, included   Radiologic Tests Ordered, included ***. I independently interpreted the images and agree with radiology interpretation.   Cardiac Monitor Tracing which shows ***   Social Determinants of Health Risk ***  Consult complete with  Medical Decision Making: Summary: ***  Reevaluation with update and discussion with   ***Considered admission***  Patient's presentation is most consistent with {EM COPA:27473}   Disposition:   ____________________________________________  FINAL CLINICAL IMPRESSION(S) / ED DIAGNOSES  Final diagnoses:  None     NEW OUTPATIENT MEDICATIONS STARTED DURING THIS VISIT:  New Prescriptions   No medications on file    Note:  This document was prepared using Dragon voice recognition software and may include unintentional dictation errors.  Nanda Quinton, MD, [facility] Emergency Medicine

## 2022-09-09 NOTE — Assessment & Plan Note (Addendum)
-   Seems to be volume overloaded -possible crackles, lower extremity edema -Continue IV diuretics -Monitoring I's and O's,

## 2022-09-09 NOTE — Progress Notes (Signed)
Pharmacy Antibiotic Note  AKEYLAH LEMOND is a 89 y.o. female admitted on 09/09/2022 with  aspiration pneumonia .  Pharmacy has been consulted for zosyn dosing.  Plan: Zosyn 3.375g IV q8h (4 hour infusion).  Height: '5\' 4"'$  (162.6 cm) Weight: 87.1 kg (192 lb) IBW/kg (Calculated) : 54.7  Temp (24hrs), Avg:98.1 F (36.7 C), Min:98.1 F (36.7 C), Max:98.1 F (36.7 C)  Recent Labs  Lab 09/09/22 1008 09/09/22 1145  WBC 16.5*  --   CREATININE  --  1.01*  LATICACIDVEN 1.3 1.0    Estimated Creatinine Clearance: 36.4 mL/min (A) (by C-G formula based on SCr of 1.01 mg/dL (H)).    No Known Allergies  Antimicrobials this admission: Zosyn 2/28 >> CTX/Azith 2/28  Microbiology results: 2/28 BCx: pending 2/28 Sputum Cx: pending   Thank you for allowing pharmacy to be a part of this patient's care.  Ramond Craver 09/09/2022 3:33 PM

## 2022-09-09 NOTE — ED Triage Notes (Signed)
Per [facility] pt is a hospice patient who started with congestion x1 week ago. Placed on a zpack by hospice but son states the patients breathing has become worse and she has sounded more congested. Dr.Long at bedside for [facility]

## 2022-09-09 NOTE — Assessment & Plan Note (Signed)
At her age, and chronic morbidities, risks outweighs the benefit of statins

## 2022-09-09 NOTE — H&P (Signed)
History and Physical   Patient: Carrie Crawford                            PCP: [name], FNP                    DOB: [birth year]            DOA: 09/09/2022 [id number]             DOS: 09/09/2022, 3:20 PM  [name], FNP  Patient coming from:   HOME  I have personally reviewed patient's medical records, in electronic medical records, including:  [place] [facility], and care everywhere.    Chief Complaint:   Chief Complaint  Patient presents with   Shortness of Breath    History of present illness:    Carrie Crawford is a 89 year old female who is presenting from home presumably has been under hospice for the past year extensive history of chronic respiratory failure on 1.5 L liters of continuous oxygen, lung nodules, COPD, HTN, HLD, CHF.Mar[place] Kitchen  Arthritis, CKD, dementia, osteopenia, .... Presented to ED with acute on chronic respiratory failure, hypoxia.  Per patient's son Carrie Crawford she is starting feeling ill, with congestion cough since last Tuesday with shortness of breath cough, despite treating with Zithromycin progressively gotten worse in past 24-36 hours.  He has increased her oxygen from 1.5 to 4/5 L of oxygen still remain hypoxic.   POA Carrie Crawford she has had pneumonia, hypoxia, lung nodule presumed cancer last year where she was discharged under hospice care.  Since then she has been stable doing well at home until this episode   ED course: Blood pressure (!) 123/46, pulse 93, temperature 98.1 F (36.7 C), temperature source Oral, resp. rate (!) 24, height '5\' 4"'$  (1.626 m), weight 87.1 kg, SpO2 100 %.  15 L of oxygen   CMP sodium 130 potassium 3.1 chloride 88, BUN 17, creatinine 1.01, calcium 7.4, GFR 52, BNP 284, troponin 9, lactic acid 1.3, 1.0, WBC 16.5, hemoglobin 10.1,  Respiratory panel including influenza A/B, SARS-CoV-2 all negative Chest x-ray-SCD on the right consistent with possible pneumonia   Was asked to admit acute respiratory  failure likely pneumonia --POA has agreed for empiric antibiotics, supportive therapy, remains DNR DNI    Patient Denies having: Fever, Chills, Cough , Chest Pain, Abd pain, N/V/D, headache, dizziness, lightheadedness,  Dysuria, Joint pain, rash, open wounds     Review of Systems: As per HPI, otherwise 10 point review of systems were negative.   ----------------------------------------------------------------------------------------------------------------------  No Known Allergies  Home MEDs:  Prior to Admission medications   Medication Sig Start Date End Date Taking? Authorizing Provider  albuterol (PROVENTIL) (2.5 MG/3ML) 0.083% nebulizer solution NEBULIZE 1 VIAL EVERY 4 HOURS AS NEEDED SHORTNESS OF BREATH 06/26/22   [name], FNP  ascorbic acid (VITAMIN C) 500 MG tablet Take 1 tablet (500 mg total) by mouth daily. 07/19/21   Barton Dubois, MD  aspirin 81 MG tablet Take 81 mg by mouth daily.    [provider]  azelastine (ASTELIN) 0.1 % nasal spray 2 sprays in each nostril at bedtime. 08/14/21   Hassell Done Mary-Margaret, FNP  Cyanocobalamin 1000 MCG CAPS Take 1,000 mcg by mouth daily.    [provider]  dextromethorphan-guaiFENesin (MUCINEX DM) 30-600 MG 12hr tablet Take 1 tablet by mouth 2 (two) times daily. 07/18/21   Barton Dubois, MD  donepezil (  ARICEPT) 5 MG tablet Take 1 tablet (5 mg total) by mouth at bedtime. 02/18/22   [name], FNP  feeding supplement (ENSURE ENLIVE / ENSURE PLUS) LIQD Take 237 mLs by mouth 2 (two) times daily between meals. Patient taking differently: Take 237 mLs by mouth daily as needed (Snack). 10/09/21   Manuella Ghazi, Pratik D, DO  Ferrous Sulfate (IRON PO) Take 65 mg by mouth daily.    [provider]  furosemide (LASIX) 40 MG tablet Take 1 tablet (40 mg total) by mouth daily. (NEEDS TO BE SEEN BEFORE NEXT REFILL) 07/21/22   [name], FNP  losartan (COZAAR) 100 MG tablet TAKE ONE TABLET AT BEDTIME Patient taking  differently: Take 100 mg by mouth at bedtime. 05/19/21   Evelina Dun A, FNP  MULTIPLE VITAMIN PO Take 1 tablet by mouth daily.    [provider]  nystatin cream (MYCOSTATIN) Apply 1 application topically 2 (two) times daily. Patient taking differently: Apply 1 application  topically daily as needed (vaginal yeast). 08/01/21   Hassell Done, Mary-Margaret, FNP  OXYGEN Inhale 1.5 L into the lungs continuous. Per patient's son liters 1 1/2 now, at home 02 liters 1 1/4    [provider]  pantoprazole (PROTONIX) 40 MG tablet TAKE ONE TABLET BY MOUTH DAILY Patient not taking: Reported on 03/05/2022 10/13/21   [name], FNP  potassium chloride (MICRO-K) 10 MEQ CR capsule Take 4 capsules (40 mEq total) by mouth 2 (two) times daily. (NEEDS TO BE SEEN BEFORE NEXT REFILL) Patient not taking: Reported on 03/24/2022 10/09/21 11/08/21  Heath Lark D, DO  Probiotic Product (PROBIOTIC PO) Take 1 tablet by mouth daily.    [provider]  Simethicone (GAS RELIEF PO) Take 125 mg by mouth at bedtime.    [provider]  sodium chloride (OCEAN) 0.65 % SOLN nasal spray Place 1 spray into both nostrils daily as needed for congestion.    [provider]  Donnal Debar 100-62.5-25 MCG/ACT AEPB INHALE 1 PUFF DAILY AS DIRECTED 04/30/22   [name], FNP    PRN MEDs: acetaminophen **OR** acetaminophen, bisacodyl, hydrALAZINE, HYDROmorphone (DILAUDID) injection, ipratropium, levalbuterol, ondansetron **OR** ondansetron (ZOFRAN) IV, oxyCODONE, senna-docusate, sodium chloride, sodium phosphate, traZODone  Past Medical History:  Diagnosis Date   Arthritis    "spine" (03/16/2018)   CHF (congestive heart failure) (HCC)    Chronic bronchitis (HCC)    Chronic kidney disease    Compression fracture of lumbar vertebra (HCC)    COPD (chronic obstructive pulmonary disease) (HCC)    Dementia (HCC)    Dyslipidemia    Hypertension    Lung nodule 10/06/2021   Multiple allergies     Osteopenia    Pneumonia    Rhinitis, chronic     Past Surgical History:  Procedure Laterality Date   CATARACT EXTRACTION W/ INTRAOCULAR LENS  IMPLANT, BILATERAL Bilateral    CHOLECYSTECTOMY     FRACTURE SURGERY     MYRINGOTOMY WITH TUBE PLACEMENT Bilateral 03/30/2022   Procedure: MYRINGOTOMY WITH TUBE PLACEMENT;  Surgeon: Jason Coop, DO;  Location: [place];  Service: ENT;  Laterality: Bilateral;   OPEN REDUCTION INTERNAL FIXATION (ORIF) DISTAL RADIAL FRACTURE Right 03/16/2018   ORIF FEMUR FRACTURE Right 03/16/2018   Procedure: OPEN REDUCTION INTERNAL FIXATION (ORIF) DISTAL FEMUR FRACTURE;  Surgeon: Shona Needles, MD;  Location: [place];  Service: Orthopedics;  Laterality: Right;   THYROIDECTOMY, PARTIAL  2004   [place]     reports that she has never  smoked. She has never used smokeless tobacco. She reports that she does not drink alcohol and does not use drugs.   Family History  Problem Relation Age of Onset   Heart disease Brother    Cancer Daughter        endometial/uterine cancer(nodule in right lung)    Physical Exam:   Vitals:   09/09/22 1206 09/09/22 1420 09/09/22 1421 09/09/22 1501  BP:      Pulse:      Resp:      Temp:    98.1 F (36.7 C)  TempSrc:    Oral  SpO2: 97% (!) 82% 100%   Weight:      Height:       Constitutional: Awake confused-  obvious discomfort, with shortness of breath, on nonrebreather will mask Eyes: PERRL, lids and conjunctivae normal ENMT: Mucous membranes are moist. Posterior pharynx clear of any exudate or lesions.Normal dentition.  Neck: normal, supple, no masses, no thyromegaly Respiratory: Diffuse rhonchi and crackles or wheezes labored breathing  cardiovascular: Regular rate and rhythm, no murmurs / rubs / gallops. ++3 extremity edema. 2+ pedal pulses. No carotid bruits.  Abdomen: no tenderness, no masses palpated. No hepatosplenomegaly. Bowel sounds positive.  Musculoskeletal: no clubbing / cyanosis. No joint deformity upper  and lower extremities. Good ROM, no contractures. Normal muscle tone.  Neurologic: CN II-XII grossly intact. Sensation intact, DTR normal. Strength 5/5 in all 4.  Psychiatric: Normal judgment and insight. Alert and oriented x 3. Normal mood.  Skin: no rashes, lesions, ulcers. No induration Decubitus/ulcers:  Wounds: per nursing documentation  Pressure Injury 10/05/21 Buttocks Right;Left Stage 2 -  Partial thickness loss of dermis presenting as a shallow open injury with a red, pink wound bed without slough. total area blanchable 5 cm X 4 cm, 2- 0.5cmx0.5cm stage 2 on each buttock (Active)  10/05/21 0203  Location: Buttocks  Location Orientation: Right;Left  Staging: Stage 2 -  Partial thickness loss of dermis presenting as a shallow open injury with a red, pink wound bed without slough.  Wound Description (Comments): total area blanchable 5 cm X 4 cm, 2- 0.5cmx0.5cm stage 2 on each buttock  Present on Admission:          Labs on admission:    I have personally reviewed following labs and imaging studies  CBC: Recent Labs  Lab 09/09/22 1008  WBC 16.5*  NEUTROABS 13.9*  HGB 10.1*  HCT 31.1*  MCV 98.4  PLT 0000000   Basic Metabolic Panel: Recent Labs  Lab 09/09/22 1145  NA 130*  K 3.1*  CL 88*  CO2 32  GLUCOSE 136*  BUN 17  CREATININE 1.01*  CALCIUM 7.4*   GFR: Estimated Creatinine Clearance: 36.4 mL/min (A) (by C-G formula based on SCr of 1.01 mg/dL (H)). Liver Function Tests: Recent Labs  Lab 09/09/22 1145  AST 20  ALT 12  ALKPHOS 75  BILITOT 0.3  PROT 7.1  ALBUMIN 2.2*   Recent Labs  Lab 09/09/22 1145  LIPASE 24     Urine analysis:    Component Value Date/Time   COLORURINE YELLOW 10/04/2021 1817   APPEARANCEUR HAZY (A) 10/04/2021 1817   LABSPEC 1.019 10/04/2021 1817   PHURINE 5.0 10/04/2021 1817   GLUCOSEU NEGATIVE 10/04/2021 1817   GLUCOSEU NEGATIVE 04/07/2016 1525   HGBUR SMALL (A) 10/04/2021 1817   BILIRUBINUR NEGATIVE 10/04/2021 1817    BILIRUBINUR negative 04/07/2016 [place] NEGATIVE 10/04/2021 1817   PROTEINUR 30 (A) 10/04/2021 1817  UROBILINOGEN 0.2 04/07/2016 1525   UROBILINOGEN 0.2 04/07/2016 1408   NITRITE NEGATIVE 10/04/2021 1817   LEUKOCYTESUR NEGATIVE 10/04/2021 1817    Last A1C:  Lab Results  Component Value Date   HGBA1C 5.8 04/29/2017     Radiologic Exams on Admission:   DG Chest Portable 1 View  Result Date: 09/09/2022 CLINICAL DATA:  Shortness of breath EXAM: PORTABLE CHEST 1 VIEW COMPARISON:  CXR 10/04/21, CT CChest 10/06/21 FINDINGS: Unchanged appearance of the left lung base with a possible layering left-sided pleural effusion. Compared to prior exam there is interval increase in/new patchy airspace opacities in the right lung base, which is suspicious for infection. No pneumothorax. Unchanged cardiac and mediastinal contours. Visualized upper abdomen is unremarkable. No radiographically apparent displaced rib fractures. Degenerative changes of the right AC joint. Previously seen nodular opacity in the left upper lung is not visualized on this exam. IMPRESSION: 1. Interval increase in/new patchy airspace opacity in the right lung base, suspicious for infection. 2. Unchanged appearance of the left lung base with a possible layering left-sided pleural effusion. 3. Previously seen nodular opacity in the left upper lung is not visualized on this exam, likely due to xray technique. Electronically Signed   By: Marin Roberts M.D.   On: 09/09/2022 09:57    EKG:   Independently reviewed.  Orders placed or performed during the hospital encounter of 09/09/22   ED EKG   ED EKG   EKG 12-Lead   EKG 12-Lead   EKG 12-Lead   ---------------------------------------------------------------------------------------------------------------------------------------    Assessment / Plan:   Principal Problem:   Sepsis ([place]) Active Problems:   Acute respiratory failure with hypoxia (HCC)   Essential  hypertension   Hyperlipidemia with target LDL less than 130   Dementia without behavioral disturbance (HCC)   Diastolic heart failure (HCC)   Anemia   Chronic kidney disease, stage 2 (mild)   Multifocal pneumonia   Leukocytosis   Pulmonary nodule   Dehydration   GERD (gastroesophageal reflux disease)   Bacteremia   Hypoxia   Assessment and Plan: * Sepsis ([place]) - Currently patient meets sepsis criteria with acute on chronic respiratory failure Blood pressure (!) 123/46, pulse 93, resp. rate (!) 24, SpO2 100 %.  On 15 L of oxygen -WBC of 16.5, -Lactic six 1.3, 1.0, -BNP 284.0 -Chest x-ray - 1. Interval increase in/new patchy airspace opacity in the right lung base, suspicious for infection. 2. Unchanged appearance of the left lung base with a possible layering left-sided pleural effusion. 3. Previously seen nodular opacity in the left upper lung is not visualized on this exam,   Therefore likely source of infection pneumonia versus aspiration pneumonia -Patient darted on broad-spectrum antibiotics with cefepime and azithromycin, switched to IV Zosyn due to possibly aspiration pneumonia -Follow-up with blood and urine culture  -Continue supportive therapy with acute respiratory failure treating underlying causes POA son confirmed DNR/DNI status    Acute respiratory failure with hypoxia (HCC) -Baseline O2 demand 1.5 L -Now requiring up to 15 L of oxygen but satting 100% -Likely source underlying pneumonia versus aspiration pneumonia -On empiric antibiotics -POA son has been contacted confirmed DNR/DNI status, -Continue supportive care -DuoNeb bronchodilators, IV ABX  Hypoxia Acute on chronic likely due to pneumonia  Bacteremia -History of bacteremia approximately year ago will follow the blood cultures -Will treat accordingly  GERD (gastroesophageal reflux disease) Continue PPI  Dehydration - Resuming gentle IV fluid hydration  Pulmonary nodule - Chronic  follow-up with outpatient -Per patient's son POA  patient has been on the hospice  Leukocytosis Likely due to pneumonia, continue antibiotics  Multifocal pneumonia Treated with broad spectrum antibiotics of Zosyn (patient with a history of bacteremia) Will follow the blood cultures, will obtain sputum cultures  Chronic kidney disease, stage 2 (mild) BUN/creatinine at baseline -Monitoring, avoiding nephrotoxins  Anemia History of anemia of chronic disease, and iron deficiency anemia,  Continue supplement iron,  currently H&H stable Monitoring  Diastolic heart failure (HCC) - Monitoring avoiding volume overload, gentle IV fluid resuscitation -Will holding Lasix diuretics for today resuming in a.m.  Dementia without behavioral disturbance (HCC) - Stable with no behavior exacerbation, continue home medication of Aricept  Hyperlipidemia with target LDL less than 130 At her age, and chronic morbidities, risks outweighs the benefit of statins  Essential hypertension - Holding home medication include Lasix and losartan -In anticipation of sepsis, avoiding hypotension When patient improves we will continue both meds   Patient seems severely ill with multiple comorbidities anticipating a steep decline High probability for passing away as an inpatient  Consults called:  Palliative Care  -------------------------------------------------------------------------------------------------------------------------------------------- DVT prophylaxis:  heparin injection 5,000 Units Start: 09/09/22 1445 TED hose Start: 09/09/22 1442 SCDs Start: 09/09/22 1442   Code Status:   Code Status: DNR?DNI  Goals of care were discussed with patient POA patient's son, Carrie Crawford who confirmed DNR/DNI status, would like to fluid and IV antibiotics for no major intervention   Admission status: Patient will be admitted as Inpatient, with a greater than 2 midnight length of stay. Level of care:  Stepdown   Family Communication:  none at bedside  (The above findings and plan of care has been discussed with patient in detail, the patient expressed understanding and agreement of above plan)  --------------------------------------------------------------------------------------------------------------------------------------------------  Disposition Plan:  Anticipated 1-2 days Status is: Inpatient Remains inpatient appropriate because: Needing IV fluids, IV antibiotics   ---------------------------------------------------------------------------------------------------------------------------  Time spent: > than  95   Min.  Critical care time was spent evaluating seeing and stabilizing this patient was spent seeing and evaluating the patient, reviewing all medical records, drawn plan of care.  SIGNED: Deatra James, MD, FHM. FAAFP. [place] [facility], Pager  (Please use [email or web address] to page/ or secure chat through epic) If 7PM-7AM, please contact night-coverage [email or web address],  09/09/2022, 3:20 PM

## 2022-09-09 NOTE — Assessment & Plan Note (Signed)
Treated with broad spectrum antibiotics of Zosyn (patient with a history of bacteremia) Will follow the blood cultures, will obtain sputum cultures

## 2022-09-09 NOTE — TOC Progression Note (Addendum)
Transition of Care [facility]) - Progression Note    Patient Details  Name: Carrie Crawford MRN: [id number] Date of Birth: [birth year]  [facility] CM/SW Contact  Boneta Lucks, RN Phone Number: 09/09/2022, 3:55 PM  Clinical Narrative:   Patient in ED active with [facility] hospice. Consult for HH/DME cancelled, Palliative going to see. TOC following.    Expected Discharge Plan: Home w Hospice Care Barriers to Discharge: Continued Medical Work up  Expected Discharge Plan and [place] with Hospice

## 2022-09-09 NOTE — Assessment & Plan Note (Signed)
Continue PPI ?

## 2022-09-10 DIAGNOSIS — A419 Sepsis, unspecified organism: Secondary | ICD-10-CM | POA: Diagnosis not present

## 2022-09-10 LAB — CBG MONITORING, ED: Glucose-Capillary: 135 mg/dL — ABNORMAL HIGH (ref 70–99)

## 2022-09-10 LAB — BASIC METABOLIC PANEL
Anion gap: 7 (ref 5–15)
BUN: 23 mg/dL (ref 8–23)
CO2: 35 mmol/L — ABNORMAL HIGH (ref 22–32)
Calcium: 7.3 mg/dL — ABNORMAL LOW (ref 8.9–10.3)
Chloride: 90 mmol/L — ABNORMAL LOW (ref 98–111)
Creatinine, Ser: 1.13 mg/dL — ABNORMAL HIGH (ref 0.44–1.00)
GFR, Estimated: 45 mL/min — ABNORMAL LOW (ref >60)
Glucose, Bld: 141 mg/dL — ABNORMAL HIGH (ref 70–99)
Potassium: 4.3 mmol/L (ref 3.5–5.1)
Sodium: 132 mmol/L — ABNORMAL LOW (ref 135–145)

## 2022-09-10 LAB — CBC
HCT: 29.8 % — ABNORMAL LOW (ref 36.0–46.0)
Hemoglobin: 9.4 g/dL — ABNORMAL LOW (ref 12.0–15.0)
MCH: 31.8 pg (ref 26.0–34.0)
MCHC: 31.5 g/dL (ref 30.0–36.0)
MCV: 100.7 fL — ABNORMAL HIGH (ref 80.0–100.0)
Platelets: 394 10*3/uL (ref 150–400)
RBC: 2.96 MIL/uL — ABNORMAL LOW (ref 3.87–5.11)
RDW: 12.7 % (ref 11.5–15.5)
WBC: 10.2 10*3/uL (ref 4.0–10.5)
nRBC: 0 % (ref 0.0–0.2)

## 2022-09-10 LAB — PROTIME-INR
INR: 1.2 (ref 0.8–1.2)
Prothrombin Time: 15 seconds (ref 11.4–15.2)

## 2022-09-10 LAB — MRSA NEXT GEN BY PCR, NASAL: MRSA by PCR Next Gen: NOT DETECTED

## 2022-09-10 LAB — APTT: aPTT: 33 seconds (ref 24–36)

## 2022-09-10 MED ORDER — CHLORHEXIDINE GLUCONATE CLOTH 2 % EX PADS
6.0000 | MEDICATED_PAD | Freq: Every day | CUTANEOUS | Status: DC
  Administered 2022-09-10: 6 via TOPICAL

## 2022-09-10 NOTE — ED Notes (Signed)
AN update given to [place].

## 2022-09-10 NOTE — ED Notes (Signed)
Attempted to call report and held on phone 6 minutes after transfer.

## 2022-09-10 NOTE — ED Notes (Signed)
Pt open eyes and mumbles words while attempting to get pt ready for transfer.

## 2022-09-10 NOTE — Progress Notes (Addendum)
PROGRESS NOTE    Patient: Carrie Crawford                            PCP: [name], FNP                    DOB: 1926-04-17            DOA: 09/09/2022 [id number]             DOS: 09/10/2022, 11:07 AM   LOS: 1 day   Date of Service: The patient was seen and examined on 09/10/2022  Subjective:   The patient was seen and examined this morning. Obtunded this morning, withdraws to pain, hemodynamically stable, down from 15 L to 5 L of oxygen, satting 96%  Brief Narrative:   Carrie Crawford is a 89 year old female who is presenting from home presumably has been under hospice for the past year extensive history of chronic respiratory failure on 1.5 L liters of continuous oxygen, lung nodules, COPD, HTN, HLD, CHF.Marland Kitchen  Arthritis, CKD, dementia, osteopenia, .... Presented to ED with acute on chronic respiratory failure, hypoxia.  Per patient's son Mr. [name] she is starting feeling ill, with congestion cough since last Tuesday with shortness of breath cough, despite treating with Zithromycin progressively gotten worse in past 24-36 hours.  He has increased her oxygen from 1.5 to 4/5 L of oxygen still remain hypoxic.   POA Mr. [name] she has had pneumonia, hypoxia, lung nodule presumed cancer last year where she was discharged under hospice care.  Since then she has been stable doing well at home until this episode   ED course: Blood pressure (!) 123/46, pulse 93, temperature 98.1 F (36.7 C), temperature source Oral, resp. rate (!) 24, height '5\' 4"'$  (1.626 m), weight 87.1 kg, SpO2 100 %.  15 L of oxygen   CMP sodium 130 potassium 3.1 chloride 88, BUN 17, creatinine 1.01, calcium 7.4, GFR 52, BNP 284, troponin 9, lactic acid 1.3, 1.0, WBC 16.5, hemoglobin 10.1,  Respiratory panel including influenza A/B, SARS-CoV-2 all negative Chest x-ray-SCD on the right consistent with possible pneumonia   Was asked to admit acute respiratory failure likely pneumonia --POA has agreed for  empiric antibiotics, supportive therapy, remains DNR DNI    Assessment & Plan:   Principal Problem:   Sepsis ([place]) Active Problems:   Acute respiratory failure with hypoxia ([place])   Essential hypertension   Hyperlipidemia with target LDL less than 130   Dementia without behavioral disturbance (HCC)   Diastolic heart failure (HCC)   Anemia   Chronic kidney disease, stage 2 (mild)   Multifocal pneumonia   Leukocytosis   Pulmonary nodule   GERD (gastroesophageal reflux disease)   Bacteremia   Hypoxia     Assessment and Plan: * Sepsis ([place]) - Obtunded this morning Blood pressure (!) 113/54, pulse 95, temperature 98.3 F (36.8 C), RR (!) 23, weight 87.1 kg, SpO2 96 %.  On 5 L of oxygen (down from 15 L-Venturi mask)  - POA; met sepsis criteria with acute on chronic respiratory failure -WBC of 16.5 >>>> 10.2 -Lactic acid 1.3, 1.0 -Procalcitonin 5.73 -BNP 284.0 -Chest x-ray - 1. Interval increase in/new patchy airspace opacity in the right lung base, suspicious for infection. 2. Unchanged appearance of the left lung base with a possible layering left-sided pleural effusion. 3. Previously seen nodular opacity in the left upper lung is not visualized on this exam,  Therefore likely source of infection pneumonia versus aspiration pneumonia -Patient darted on broad-spectrum antibiotics with cefepime and azithromycin, switched to IV Zosyn due to possibly aspiration pneumonia -Follow-up with blood and urine culture  -Continue supportive therapy with acute respiratory failure treating underlying causes POA son confirmed DNR/DNI status    Acute respiratory failure with hypoxia (HCC) -Baseline O2 demand 1.5 L -Subsequently required up to 15 L of oxygen, via Ventimask,>>> much improved now on 5 L of oxygen, satting 96%  -Likely source underlying pneumonia versus aspiration pneumonia -On empiric antibiotics--continue IV antibiotics -POA son has been contacted confirmed DNR/DNI  status, -Continue supportive care -DuoNeb bronchodilators, IV ABX  Hypoxia Acute on chronic likely due to pneumonia -Down to 5 L of oxygen from 15 satting 96%   Bacteremia -History of bacteremia approximately year ago will follow the blood cultures -Will treat accordingly  GERD (gastroesophageal reflux disease) Continue PPI  Pulmonary nodule - Chronic follow-up with outpatient -Per patient's son POA patient has been on the hospice  Leukocytosis Likely due to pneumonia, continue antibiotics  Multifocal pneumonia Treated with broad spectrum antibiotics of Zosyn (patient with a history of bacteremia) Will follow the blood cultures, will obtain sputum cultures  Chronic kidney disease, stage 2 (mild) BUN/creatinine at baseline -Monitoring, avoiding nephrotoxins Lab Results  Component Value Date   CREATININE 1.13 (H) 09/10/2022   CREATININE 1.01 (H) 09/09/2022   CREATININE 0.76 11/06/2021     Anemia History of anemia of chronic disease, and iron deficiency anemia,  Continue supplement iron,  currently H&H stable Monitoring  Diastolic heart failure (HCC) - Seems to be volume overloaded -possible crackles, lower extremity edema -Continue IV diuretics -Monitoring I's and O's,  Dementia without behavioral disturbance (HCC) - Stable with no behavior exacerbation, continue home medication of Aricept  Hyperlipidemia with target LDL less than 130 At her age, and chronic morbidities, risks outweighs the benefit of statins  Essential hypertension - Holding home medication include losartan -BP remained stable  Dehydration-resolved as of 09/10/2022 - Resuming gentle IV fluid hydration  Skin Assessment: I have examined the patient's skin and I agree with the wound assessment as performed by wound care team As outlined belowe: Pressure Injury 10/05/21 Buttocks Right;Left Stage 2 -  Partial thickness loss of dermis presenting as a shallow open injury with a red, pink wound  bed without slough. total area blanchable 5 cm X 4 cm, 2- 0.5cmx0.5cm stage 2 on each buttock (Active)  10/05/21 0203  Location: Buttocks  Location Orientation: Right;Left  Staging: Stage 2 -  Partial thickness loss of dermis presenting as a shallow open injury with a red, pink wound bed without slough.  Wound Description (Comments): total area blanchable 5 cm X 4 cm, 2- 0.5cmx0.5cm stage 2 on each buttock  Present on Admission:      Pressure Injury 09/09/22 Buttocks Left Stage 3 -  Full thickness tissue loss. Subcutaneous fat may be visible but bone, tendon or muscle are NOT exposed. (Active)  09/09/22 1800  Location: Buttocks  Location Orientation: Left  Staging: Stage 3 -  Full thickness tissue loss. Subcutaneous fat may be visible but bone, tendon or muscle are NOT exposed.  Wound Description (Comments):   Present on Admission: Yes     ------------------------------------------------------------------------------------------------------------------------- Cultures; Blood Cultures x 2 >> NGT -------------------------------------------------------------------------------------------------------------------------  DVT prophylaxis:  heparin injection 5,000 Units Start: 09/09/22 1445 TED hose Start: 09/09/22 1442 SCDs Start: 09/09/22 1442   Code Status:   Code Status: DNR  Family Communication: No family member  present at bedside-  Plan of care was discussed with patient's POA Mr. Harriet Pho at [phone]  The above findings and plan of care has been discussed with patient's son POA in detail,  they expressed understanding and agreement of above. -Advance care planning has been discussed.   Admission status:   Status is: Inpatient Remains inpatient appropriate because: Needing IV antibiotics, IV diuretics,   Disposition: From  - home             Planning for discharge in 1-2 days: to   Procedures:   No admission procedures for hospital encounter.   Antimicrobials:   Anti-infectives (From admission, onward)    Start     Dose/Rate Route Frequency Ordered Stop   09/10/22 0000  piperacillin-tazobactam (ZOSYN) IVPB 3.375 g        3.375 g 12.5 mL/hr over 240 Minutes Intravenous Every 8 hours 09/09/22 1517     09/09/22 1500  piperacillin-tazobactam (ZOSYN) IVPB 3.375 g        3.375 g 100 mL/hr over 30 Minutes Intravenous  Once 09/09/22 1459 09/09/22 1613   09/09/22 1445  cefTRIAXone (ROCEPHIN) 2 g in sodium chloride 0.9 % 100 mL IVPB  Status:  Discontinued        2 g 200 mL/hr over 30 Minutes Intravenous Every 24 hours 09/09/22 1444 09/09/22 1451   09/09/22 1445  azithromycin (ZITHROMAX) 500 mg in sodium chloride 0.9 % 250 mL IVPB  Status:  Discontinued        500 mg 250 mL/hr over 60 Minutes Intravenous Every 24 hours 09/09/22 1444 09/09/22 1451   09/09/22 1015  cefTRIAXone (ROCEPHIN) 1 g in sodium chloride 0.9 % 100 mL IVPB        1 g 200 mL/hr over 30 Minutes Intravenous  Once 09/09/22 1010 09/09/22 1123   09/09/22 1015  azithromycin (ZITHROMAX) 500 mg in sodium chloride 0.9 % 250 mL IVPB  Status:  Discontinued        500 mg 250 mL/hr over 60 Minutes Intravenous Every 24 hours 09/09/22 1010 09/09/22 1451        Medication:   acidophilus  1 capsule Oral Daily   ascorbic acid  500 mg Oral Daily   azelastine  2 spray Each Nare BID   Chlorhexidine Gluconate Cloth  6 each Topical Daily   dextromethorphan-guaiFENesin  1 tablet Oral BID   donepezil  5 mg Oral QHS   feeding supplement  237 mL Oral BID BM   fluticasone furoate-vilanterol  1 puff Inhalation Daily   And   umeclidinium bromide  1 puff Inhalation Daily   furosemide  60 mg Intravenous Q12H   glycopyrrolate  1 mg Oral BID   heparin  5,000 Units Subcutaneous Q8H   methylPREDNISolone (SOLU-MEDROL) injection  40 mg Intravenous Q12H   potassium chloride  40 mEq Oral BID   sodium chloride flush  3 mL Intravenous Q12H    acetaminophen **OR** acetaminophen, bisacodyl, hydrALAZINE,  HYDROmorphone (DILAUDID) injection, ipratropium, levalbuterol, ondansetron **OR** ondansetron (ZOFRAN) IV, oxyCODONE, senna-docusate, sodium chloride, sodium phosphate, traZODone   Objective:   Vitals:   09/10/22 0800 09/10/22 0813 09/10/22 1000 09/10/22 1030  BP: (!) 112/48  (!) 113/42 (!) 113/54  Pulse: 77  73 95  Resp: 20  18 (!) 23  Temp:  98.3 F (36.8 C)    TempSrc:  Axillary    SpO2: 100%  100% 96%  Weight:      Height:        Intake/Output  Summary (Last 24 hours) at 09/10/2022 1107 Last data filed at 09/10/2022 0548 Gross per 24 hour  Intake 142.69 ml  Output 450 ml  Net -307.31 ml   Filed Weights   09/09/22 0955  Weight: 87.1 kg     Physical examination:   Constitution: Obtunded HEENT:        On Ventimask PERRL,  Chest:         Chest symmetric Cardio vascular:  S1/S2, RRR, No murmure, No Rubs or Gallops  pulmonary: Negative for any wheezing, positive breath sounds diffusely, positive crackles mid to lower lobes bilaterally  abdomen: Soft, non-tender, non-distended, bowel sounds,no masses, no organomegaly Muscular skeletal: Limited exam -obtunded, withdraws to pain,   Neuro: Limited exam patient is encephalopathic Extremities: +3pitting edema lower extremities, +2 pulses  Skin: Dry, warm to touch, negative for any Rashes, No open wounds Wounds: per nursing documentation   ------------------------------------------------------------------------------------------------------------------------------------------    LABs:     Latest Ref Rng & Units 09/10/2022    5:05 AM 09/09/2022   10:08 AM 10/20/2021   10:00 AM  CBC  WBC 4.0 - 10.5 K/uL 10.2  16.5  5.5   Hemoglobin 12.0 - 15.0 g/dL 9.4  10.1  9.5   Hematocrit 36.0 - 46.0 % 29.8  31.1  27.3   Platelets 150 - 400 K/uL 394  392  335       Latest Ref Rng & Units 09/10/2022    5:05 AM 09/09/2022   11:45 AM 11/06/2021   12:06 PM  CMP  Glucose 70 - 99 mg/dL 141  136  123   BUN 8 - 23 mg/dL '23  17  14    '$ Creatinine 0.44 - 1.00 mg/dL 1.13  1.01  0.76   Sodium 135 - 145 mmol/L 132  130  133   Potassium 3.5 - 5.1 mmol/L 4.3  3.1  3.5   Chloride 98 - 111 mmol/L 90  88  93   CO2 22 - 32 mmol/L 35  32  26   Calcium 8.9 - 10.3 mg/dL 7.3  7.4  8.6   Total Protein 6.5 - 8.1 g/dL  7.1    Total Bilirubin 0.3 - 1.2 mg/dL  0.3    Alkaline Phos 38 - 126 U/L  75    AST 15 - 41 U/L  20    ALT 0 - 44 U/L  12         Micro Results Recent Results (from the past 240 hour(s))  Resp panel by RT-PCR (RSV, Flu A&B, Covid) Anterior Nasal Swab     Status: None   Collection Time: 09/09/22 10:08 AM   Specimen: Anterior Nasal Swab  Result Value Ref Range Status   SARS Coronavirus 2 by RT PCR NEGATIVE NEGATIVE Final    Comment: (NOTE) SARS-CoV-2 target nucleic acids are NOT DETECTED.  The SARS-CoV-2 RNA is generally detectable in upper respiratory specimens during the acute phase of infection. The lowest concentration of SARS-CoV-2 viral copies this assay can detect is 138 copies/mL. A negative result does not preclude SARS-Cov-2 infection and should not be used as the sole basis for treatment or other patient management decisions. A negative result may occur with  improper specimen collection/handling, submission of specimen other than nasopharyngeal swab, presence of viral mutation(s) within the areas targeted by this assay, and inadequate number of viral copies(<138 copies/mL). A negative result must be combined with clinical observations, patient history, and epidemiological information. The expected result is Negative.  Fact Sheet for  Patients:  EntrepreneurPulse.com.au  Fact Sheet for Healthcare Providers:  IncredibleEmployment.be  This test is no t yet approved or cleared by the [place] FDA and  has been authorized for detection and/or diagnosis of SARS-CoV-2 by FDA under an Emergency Use Authorization (EUA). This EUA will remain  in effect (meaning  this test can be used) for the duration of the COVID-19 declaration under Section 564(b)(1) of the Act, 21 U.S.C.section 360bbb-3(b)(1), unless the authorization is terminated  or revoked sooner.       Influenza A by PCR NEGATIVE NEGATIVE Final   Influenza B by PCR NEGATIVE NEGATIVE Final    Comment: (NOTE) The Xpert Xpress SARS-CoV-2/FLU/RSV plus assay is intended as an aid in the diagnosis of influenza from Nasopharyngeal swab specimens and should not be used as a sole basis for treatment. Nasal washings and aspirates are unacceptable for Xpert Xpress SARS-CoV-2/FLU/RSV testing.  Fact Sheet for Patients: EntrepreneurPulse.com.au  Fact Sheet for Healthcare Providers: IncredibleEmployment.be  This test is not yet approved or cleared by the [place] FDA and has been authorized for detection and/or diagnosis of SARS-CoV-2 by FDA under an Emergency Use Authorization (EUA). This EUA will remain in effect (meaning this test can be used) for the duration of the COVID-19 declaration under Section 564(b)(1) of the Act, 21 U.S.C. section 360bbb-3(b)(1), unless the authorization is terminated or revoked.     Resp Syncytial Virus by PCR NEGATIVE NEGATIVE Final    Comment: (NOTE) Fact Sheet for Patients: EntrepreneurPulse.com.au  Fact Sheet for Healthcare Providers: IncredibleEmployment.be  This test is not yet approved or cleared by the [place] FDA and has been authorized for detection and/or diagnosis of SARS-CoV-2 by FDA under an Emergency Use Authorization (EUA). This EUA will remain in effect (meaning this test can be used) for the duration of the COVID-19 declaration under Section 564(b)(1) of the Act, 21 U.S.C. section 360bbb-3(b)(1), unless the authorization is terminated or revoked.  Performed at [facility], [address] [place], [place] [address]     Radiology Reports No results  found.  SIGNED: Deatra James, MD, FHM. FAAFP. Zacarias Pontes - [facility] Time spent > 75 min.  Critical care time was spent in seeing, evaluating and examining the patient. Reviewing medical records, labs, drawn plan of care. Discussing with consultants including patient's son POA and palliative care team   [facility],  Pager (please use [email or web address] to page/ text) Please use Epic Secure Chat for non-urgent communication (7AM-7PM)  If 7PM-7AM, please contact night-coverage [email or web address], 09/10/2022, 11:07 AM

## 2022-09-10 NOTE — Progress Notes (Signed)
PT Cancellation Note  Patient Details Name: Carrie Crawford MRN: [id number] DOB: 15-Sep-1925   Cancelled Treatment:    Reason Eval/Treat Not Completed: Other (comment).  Patient is currently on hospice care (comfort measures only).   2:22 PM, 09/10/22 Lonell Grandchild, MPT Physical Therapist with [phone] office [phone] mobile phone

## 2022-09-11 DIAGNOSIS — A419 Sepsis, unspecified organism: Principal | ICD-10-CM

## 2022-09-11 LAB — BASIC METABOLIC PANEL
Anion gap: 11 (ref 5–15)
BUN: 36 mg/dL — ABNORMAL HIGH (ref 8–23)
CO2: 36 mmol/L — ABNORMAL HIGH (ref 22–32)
Calcium: 7.4 mg/dL — ABNORMAL LOW (ref 8.9–10.3)
Chloride: 88 mmol/L — ABNORMAL LOW (ref 98–111)
Creatinine, Ser: 1.34 mg/dL — ABNORMAL HIGH (ref 0.44–1.00)
GFR, Estimated: 37 mL/min — ABNORMAL LOW (ref >60)
Glucose, Bld: 136 mg/dL — ABNORMAL HIGH (ref 70–99)
Potassium: 3.6 mmol/L (ref 3.5–5.1)
Sodium: 135 mmol/L (ref 135–145)

## 2022-09-11 LAB — CBC
HCT: 29.9 % — ABNORMAL LOW (ref 36.0–46.0)
Hemoglobin: 9.8 g/dL — ABNORMAL LOW (ref 12.0–15.0)
MCH: 32.3 pg (ref 26.0–34.0)
MCHC: 32.8 g/dL (ref 30.0–36.0)
MCV: 98.7 fL (ref 80.0–100.0)
Platelets: 467 10*3/uL — ABNORMAL HIGH (ref 150–400)
RBC: 3.03 MIL/uL — ABNORMAL LOW (ref 3.87–5.11)
RDW: 12.7 % (ref 11.5–15.5)
WBC: 12 10*3/uL — ABNORMAL HIGH (ref 4.0–10.5)
nRBC: 0 % (ref 0.0–0.2)

## 2022-09-11 LAB — GLUCOSE, CAPILLARY: Glucose-Capillary: 133 mg/dL — ABNORMAL HIGH (ref 70–99)

## 2022-09-11 MED ORDER — FUROSEMIDE 10 MG/ML IJ SOLN
20.0000 mg | Freq: Two times a day (BID) | INTRAMUSCULAR | Status: DC
  Administered 2022-09-11 – 2022-09-13 (×4): 20 mg via INTRAVENOUS
  Filled 2022-09-11 (×4): qty 2

## 2022-09-11 NOTE — Progress Notes (Signed)
PROGRESS NOTE    Patient: Carrie Crawford                            PCP: [name], FNP                    DOB: March 17, 1926            DOA: 09/09/2022 [id number]             DOS: 09/11/2022, 11:10 AM   LOS: 2 days   Date of Service: The patient was seen and examined on 09/11/2022  Subjective:   The patient was seen and examined this morning, much more awake alert, following command, pleasantly confused Shortness of breath and hypoxia has improved, Off Ventimask 15 L, down to 5 L of oxygen by nasal cannula, satting 92%  Brief Narrative:   SURAI PLESS is a 89 year old female who is presenting from home presumably has been under hospice for the past year extensive history of chronic respiratory failure on 1.5 L liters of continuous oxygen, lung nodules, COPD, HTN, HLD, CHF.Marland Kitchen  Arthritis, CKD, dementia, osteopenia, .... Presented to ED with acute on chronic respiratory failure, hypoxia.  Per patient's son Mr. [name] she is starting feeling ill, with congestion cough since last Tuesday with shortness of breath cough, despite treating with Zithromycin progressively gotten worse in past 24-36 hours.  He has increased her oxygen from 1.5 to 4/5 L of oxygen still remain hypoxic.   POA Mr. [name] she has had pneumonia, hypoxia, lung nodule presumed cancer last year where she was discharged under hospice care.  Since then she has been stable doing well at home until this episode   ED course: Blood pressure (!) 123/46, pulse 93, temperature 98.1 F (36.7 C), temperature source Oral, resp. rate (!) 24, height '5\' 4"'$  (1.626 m), weight 87.1 kg, SpO2 100 %.  15 L of oxygen   CMP sodium 130 potassium 3.1 chloride 88, BUN 17, creatinine 1.01, calcium 7.4, GFR 52, BNP 284, troponin 9, lactic acid 1.3, 1.0, WBC 16.5, hemoglobin 10.1,  Respiratory panel including influenza A/B, SARS-CoV-2 all negative Chest x-ray-SCD on the right consistent with possible pneumonia   Was asked to  admit acute respiratory failure likely pneumonia --POA has agreed for empiric antibiotics, supportive therapy, remains DNR DNI    Assessment & Plan:   Principal Problem:   Sepsis ([place]) Active Problems:   Acute respiratory failure with hypoxia ([place])   Essential hypertension   Hyperlipidemia with target LDL less than 130   Dementia without behavioral disturbance (HCC)   Diastolic heart failure (HCC)   Anemia   Chronic kidney disease, stage 2 (mild)   Multifocal pneumonia   Leukocytosis   Pulmonary nodule   GERD (gastroesophageal reflux disease)   Bacteremia   Hypoxia     Assessment and Plan: * Sepsis ([place]) - Obtunded this morning Blood pressure (!) 113/54, pulse 95, temperature 98.3 F (36.8 C), RR (!) 23, weight 87.1 kg, SpO2 96 %.  On 5 L of oxygen (down from 15 L-Venturi mask)  - POA; met sepsis criteria with acute on chronic respiratory failure -WBC of 16.5 >>>> 10.2 -Lactic acid 1.3, 1.0 -Procalcitonin 5.73 -BNP 284.0 -Chest x-ray - 1. Interval increase in/new patchy airspace opacity in the right lung base, suspicious for infection. 2. Unchanged appearance of the left lung base with a possible layering left-sided pleural effusion. 3. Previously seen nodular opacity  in the left upper lung is not visualized on this exam,   Therefore likely source of infection pneumonia versus aspiration pneumonia -Patient darted on broad-spectrum antibiotics with cefepime and azithromycin, switched to IV Zosyn due to possibly aspiration pneumonia -Follow-up with blood and urine culture  -Continue supportive therapy with acute respiratory failure treating underlying causes POA son confirmed DNR/DNI status    Acute respiratory failure with hypoxia (HCC) -Shortness of breath and hypoxia has improved with -Baseline O2 demand 1.5 L -Subsequently required up to 15 L of oxygen, via Ventimask,>>> much improved now on 5 L of oxygen, satting 92%  -Likely source underlying pneumonia  versus aspiration pneumonia -On empiric antibiotics--continue IV antibiotics -POA son has been contacted confirmed DNR/DNI status, -Continue supportive care -DuoNeb bronchodilators, IV ABX  Hypoxia -Improved Acute on chronic likely due to pneumonia -Down to 5 L of oxygen from 15 satting 96%   Bacteremia -History of bacteremia approximately year ago will follow the blood cultures -Will treat accordingly -Blood cultures remain negative to date  GERD (gastroesophageal reflux disease) Continue PPI  Pulmonary nodule - Chronic follow-up with outpatient -Per patient's son POA patient has been on the hospice  Leukocytosis Likely due to pneumonia, continue antibiotics  Multifocal pneumonia Treated with broad spectrum antibiotics of Zosyn (patient with a history of bacteremia) Will follow the blood cultures, will obtain sputum cultures  Chronic kidney disease, stage 2 (mild) BUN/creatinine at baseline -Monitoring, avoiding nephrotoxins Lab Results  Component Value Date   CREATININE 1.34 (H) 09/11/2022   CREATININE 1.13 (H) 09/10/2022   CREATININE 1.01 (H) 09/09/2022       Anemia History of anemia of chronic disease, and iron deficiency anemia,  Continue supplement iron,     Latest Ref Rng & Units 09/11/2022    4:02 AM 09/10/2022    5:05 AM 09/09/2022   10:08 AM  CBC  WBC 4.0 - 10.5 K/uL 12.0  10.2  16.5   Hemoglobin 12.0 - 15.0 g/dL 9.8  9.4  10.1   Hematocrit 36.0 - 46.0 % 29.9  29.8  31.1   Platelets 150 - 400 K/uL 467  394  0000000      Diastolic heart failure (HCC) - Seems to be volume overloaded -possible crackles, lower extremity edema -Continue IV diuretics -Monitoring I's and O's,  Intake/Output Summary (Last 24 hours) at 09/11/2022 1110 Last data filed at 09/10/2022 2300 Gross per 24 hour  Intake --  Output 350 ml  Net -350 ml     Dementia without behavioral disturbance (HCC) - Stable with no behavior exacerbation, continue home medication of  Aricept  Hyperlipidemia with target LDL less than 130 At her age, and chronic morbidities, risks outweighs the benefit of statins  Essential hypertension -hypotensive - Holding home medication include losartan -Monitoring BP closely   Skin Assessment: I have examined the patient's skin and I agree with the wound assessment as performed by wound care team As outlined belowe: Pressure Injury 10/05/21 Buttocks Right;Left Stage 2 -  Partial thickness loss of dermis presenting as a shallow open injury with a red, pink wound bed without slough. total area blanchable 5 cm X 4 cm, 2- 0.5cmx0.5cm stage 2 on each buttock (Active)  10/05/21 0203  Location: Buttocks  Location Orientation: Right;Left  Staging: Stage 2 -  Partial thickness loss of dermis presenting as a shallow open injury with a red, pink wound bed without slough.  Wound Description (Comments): total area blanchable 5 cm X 4 cm, 2- 0.5cmx0.5cm stage 2 on  each buttock  Present on Admission:      Pressure Injury 09/09/22 Buttocks Left Stage 3 -  Full thickness tissue loss. Subcutaneous fat may be visible but bone, tendon or muscle are NOT exposed. (Active)  09/09/22 1800  Location: Buttocks  Location Orientation: Left  Staging: Stage 3 -  Full thickness tissue loss. Subcutaneous fat may be visible but bone, tendon or muscle are NOT exposed.  Wound Description (Comments):   Present on Admission: Yes  Dressing Type Foam - Lift dressing to assess site every shift 09/10/22 2022     ------------------------------------------------------------------------------------------------------------------------- Cultures; Blood Cultures x 2 >> NGT -------------------------------------------------------------------------------------------------------------------------  DVT prophylaxis:  heparin injection 5,000 Units Start: 09/09/22 1445 TED hose Start: 09/09/22 1442 SCDs Start: 09/09/22 1442   Code Status:   Code Status: DNR  Family  Communication: No family member present at bedside-  Plan of care was discussed with patient's POA Mr. Harriet Pho at [phone]  The above findings and plan of care has been discussed with patient's son POA in detail,  they expressed understanding and agreement of above. -Advance care planning has been discussed.   Admission status:   Status is: Inpatient Remains inpatient appropriate because: Needing IV antibiotics, IV diuretics,   Disposition: From  - home             Planning for discharge in 1-2 days: to   Procedures:   No admission procedures for hospital encounter.   Antimicrobials:  Anti-infectives (From admission, onward)    Start     Dose/Rate Route Frequency Ordered Stop   09/10/22 0000  piperacillin-tazobactam (ZOSYN) IVPB 3.375 g        3.375 g 12.5 mL/hr over 240 Minutes Intravenous Every 8 hours 09/09/22 1517     09/09/22 1500  piperacillin-tazobactam (ZOSYN) IVPB 3.375 g        3.375 g 100 mL/hr over 30 Minutes Intravenous  Once 09/09/22 1459 09/09/22 1613   09/09/22 1445  cefTRIAXone (ROCEPHIN) 2 g in sodium chloride 0.9 % 100 mL IVPB  Status:  Discontinued        2 g 200 mL/hr over 30 Minutes Intravenous Every 24 hours 09/09/22 1444 09/09/22 1451   09/09/22 1445  azithromycin (ZITHROMAX) 500 mg in sodium chloride 0.9 % 250 mL IVPB  Status:  Discontinued        500 mg 250 mL/hr over 60 Minutes Intravenous Every 24 hours 09/09/22 1444 09/09/22 1451   09/09/22 1015  cefTRIAXone (ROCEPHIN) 1 g in sodium chloride 0.9 % 100 mL IVPB        1 g 200 mL/hr over 30 Minutes Intravenous  Once 09/09/22 1010 09/09/22 1123   09/09/22 1015  azithromycin (ZITHROMAX) 500 mg in sodium chloride 0.9 % 250 mL IVPB  Status:  Discontinued        500 mg 250 mL/hr over 60 Minutes Intravenous Every 24 hours 09/09/22 1010 09/09/22 1451        Medication:   acidophilus  1 capsule Oral Daily   ascorbic acid  500 mg Oral Daily   azelastine  2 spray Each Nare BID    dextromethorphan-guaiFENesin  1 tablet Oral BID   donepezil  5 mg Oral QHS   feeding supplement  237 mL Oral BID BM   fluticasone furoate-vilanterol  1 puff Inhalation Daily   And   umeclidinium bromide  1 puff Inhalation Daily   furosemide  20 mg Intravenous Q12H   glycopyrrolate  1 mg Oral BID   heparin  5,000 Units Subcutaneous Q8H   methylPREDNISolone (SOLU-MEDROL) injection  40 mg Intravenous Q12H   sodium chloride flush  3 mL Intravenous Q12H    acetaminophen **OR** acetaminophen, bisacodyl, hydrALAZINE, HYDROmorphone (DILAUDID) injection, ipratropium, levalbuterol, ondansetron **OR** ondansetron (ZOFRAN) IV, oxyCODONE, senna-docusate, sodium chloride, sodium phosphate, traZODone   Objective:   Vitals:   09/10/22 1950 09/10/22 2355 09/11/22 0336 09/11/22 0500  BP: (!) 118/91 (!) 102/54 (!) 94/48   Pulse: 76 77 84   Resp: '14 20 16   '$ Temp: 98 F (36.7 C) 98 F (36.7 C) 98.7 F (37.1 C)   TempSrc: Oral Oral Oral   SpO2: 92% 98% 92%   Weight:    80.2 kg  Height:        Intake/Output Summary (Last 24 hours) at 09/11/2022 1110 Last data filed at 09/10/2022 2300 Gross per 24 hour  Intake --  Output 350 ml  Net -350 ml   Filed Weights   09/09/22 0955 09/10/22 1215 09/11/22 0500  Weight: 87.1 kg 79.8 kg 80.2 kg     Physical examination:   General:  Much more awake, pleasantly confused  HEENT:  Normocephalic, PERRL, otherwise with in Normal limits   Neuro:  Limited exam, pleasantly demented CNII-XII intact. , normal motor and sensation, reflexes intact   Lungs:   Clear to auscultation BL, Respirations unlabored,  No wheezes /bilateral lower lobe crackles, improved  Cardio:    S1/S2, RRR, No murmure, No Rubs or Gallops   Abdomen:  Soft, non-tender, bowel sounds active all four quadrants, no guarding or peritoneal signs.  Muscular  skeletal:  Limited exam -severe, global generalized weaknesses - in bed, able to move all 4 extremities,   2+ pulses,  symmetric, No  pitting edema  Skin:  Dry, warm to touch, see note below  Wounds: Please see nursing documentation  Pressure Injury 10/05/21 Buttocks Right;Left Stage 2 -  Partial thickness loss of dermis presenting as a shallow open injury with a red, pink wound bed without slough. total area blanchable 5 cm X 4 cm, 2- 0.5cmx0.5cm stage 2 on each buttock (Active)  10/05/21 0203  Location: Buttocks  Location Orientation: Right;Left  Staging: Stage 2 -  Partial thickness loss of dermis presenting as a shallow open injury with a red, pink wound bed without slough.  Wound Description (Comments): total area blanchable 5 cm X 4 cm, 2- 0.5cmx0.5cm stage 2 on each buttock  Present on Admission:      Pressure Injury 09/09/22 Buttocks Left Stage 3 -  Full thickness tissue loss. Subcutaneous fat may be visible but bone, tendon or muscle are NOT exposed. (Active)  09/09/22 1800  Location: Buttocks  Location Orientation: Left  Staging: Stage 3 -  Full thickness tissue loss. Subcutaneous fat may be visible but bone, tendon or muscle are NOT exposed.  Wound Description (Comments):   Present on Admission: Yes  Dressing Type Foam - Lift dressing to assess site every shift 09/10/22 2022         ------------------------------------------------------------------------------------------------------------------------------------------    LABs:     Latest Ref Rng & Units 09/11/2022    4:02 AM 09/10/2022    5:05 AM 09/09/2022   10:08 AM  CBC  WBC 4.0 - 10.5 K/uL 12.0  10.2  16.5   Hemoglobin 12.0 - 15.0 g/dL 9.8  9.4  10.1   Hematocrit 36.0 - 46.0 % 29.9  29.8  31.1   Platelets 150 - 400 K/uL 467  394  392  Latest Ref Rng & Units 09/11/2022    4:02 AM 09/10/2022    5:05 AM 09/09/2022   11:45 AM  CMP  Glucose 70 - 99 mg/dL 136  141  136   BUN 8 - 23 mg/dL 36  23  17   Creatinine 0.44 - 1.00 mg/dL 1.34  1.13  1.01   Sodium 135 - 145 mmol/L 135  132  130   Potassium 3.5 - 5.1 mmol/L 3.6  4.3  3.1   Chloride 98  - 111 mmol/L 88  90  88   CO2 22 - 32 mmol/L 36  35  32   Calcium 8.9 - 10.3 mg/dL 7.4  7.3  7.4   Total Protein 6.5 - 8.1 g/dL   7.1   Total Bilirubin 0.3 - 1.2 mg/dL   0.3   Alkaline Phos 38 - 126 U/L   75   AST 15 - 41 U/L   20   ALT 0 - 44 U/L   12        Micro Results Recent Results (from the past 240 hour(s))  Resp panel by RT-PCR (RSV, Flu A&B, Covid) Anterior Nasal Swab     Status: None   Collection Time: 09/09/22 10:08 AM   Specimen: Anterior Nasal Swab  Result Value Ref Range Status   SARS Coronavirus 2 by RT PCR NEGATIVE NEGATIVE Final    Comment: (NOTE) SARS-CoV-2 target nucleic acids are NOT DETECTED.  The SARS-CoV-2 RNA is generally detectable in upper respiratory specimens during the acute phase of infection. The lowest concentration of SARS-CoV-2 viral copies this assay can detect is 138 copies/mL. A negative result does not preclude SARS-Cov-2 infection and should not be used as the sole basis for treatment or other patient management decisions. A negative result may occur with  improper specimen collection/handling, submission of specimen other than nasopharyngeal swab, presence of viral mutation(s) within the areas targeted by this assay, and inadequate number of viral copies(<138 copies/mL). A negative result must be combined with clinical observations, patient history, and epidemiological information. The expected result is Negative.  Fact Sheet for Patients:  EntrepreneurPulse.com.au  Fact Sheet for Healthcare Providers:  IncredibleEmployment.be  This test is no t yet approved or cleared by the [place] FDA and  has been authorized for detection and/or diagnosis of SARS-CoV-2 by FDA under an Emergency Use Authorization (EUA). This EUA will remain  in effect (meaning this test can be used) for the duration of the COVID-19 declaration under Section 564(b)(1) of the Act, 21 U.S.C.section 360bbb-3(b)(1), unless  the authorization is terminated  or revoked sooner.       Influenza A by PCR NEGATIVE NEGATIVE Final   Influenza B by PCR NEGATIVE NEGATIVE Final    Comment: (NOTE) The Xpert Xpress SARS-CoV-2/FLU/RSV plus assay is intended as an aid in the diagnosis of influenza from Nasopharyngeal swab specimens and should not be used as a sole basis for treatment. Nasal washings and aspirates are unacceptable for Xpert Xpress SARS-CoV-2/FLU/RSV testing.  Fact Sheet for Patients: EntrepreneurPulse.com.au  Fact Sheet for Healthcare Providers: IncredibleEmployment.be  This test is not yet approved or cleared by the [place] FDA and has been authorized for detection and/or diagnosis of SARS-CoV-2 by FDA under an Emergency Use Authorization (EUA). This EUA will remain in effect (meaning this test can be used) for the duration of the COVID-19 declaration under Section 564(b)(1) of the Act, 21 U.S.C. section 360bbb-3(b)(1), unless the authorization is terminated or revoked.  Resp Syncytial Virus by PCR NEGATIVE NEGATIVE Final    Comment: (NOTE) Fact Sheet for Patients: EntrepreneurPulse.com.au  Fact Sheet for Healthcare Providers: IncredibleEmployment.be  This test is not yet approved or cleared by the [place] FDA and has been authorized for detection and/or diagnosis of SARS-CoV-2 by FDA under an Emergency Use Authorization (EUA). This EUA will remain in effect (meaning this test can be used) for the duration of the COVID-19 declaration under Section 564(b)(1) of the Act, 21 U.S.C. section 360bbb-3(b)(1), unless the authorization is terminated or revoked.  Performed at [facility], [address], [place], [place] [address]   Culture, blood (routine x 2) Call MD if unable to obtain prior to antibiotics being given     Status: None (Preliminary result)   Collection Time: 09/09/22  3:40 PM   Specimen: BLOOD   Result Value Ref Range Status   Specimen Description BLOOD BLOOD LEFT ARM  Final   Special Requests   Final    BOTTLES DRAWN AEROBIC AND ANAEROBIC Blood Culture adequate volume   Culture   Final    NO GROWTH 2 DAYS Performed at [facility], [address] [place], [place] [address]    Report Status PENDING  Incomplete  Culture, blood (routine x 2) Call MD if unable to obtain prior to antibiotics being given     Status: None (Preliminary result)   Collection Time: 09/09/22  3:40 PM   Specimen: BLOOD  Result Value Ref Range Status   Specimen Description BLOOD BLOOD LEFT ARM  Final   Special Requests   Final    BOTTLES DRAWN AEROBIC AND ANAEROBIC Blood Culture results may not be optimal due to an excessive volume of blood received in culture bottles   Culture   Final    NO GROWTH 2 DAYS Performed at [facility], [address] [place], [place] [address]    Report Status PENDING  Incomplete  MRSA Next Gen by PCR, Nasal     Status: None   Collection Time: 09/10/22  4:40 PM   Specimen: Nasal Mucosa; Nasal Swab  Result Value Ref Range Status   MRSA by PCR Next Gen NOT DETECTED NOT DETECTED Final    Comment: (NOTE) The GeneXpert MRSA Assay (FDA approved for NASAL specimens only), is one component of a comprehensive MRSA colonization surveillance program. It is not intended to diagnose MRSA infection nor to guide or monitor treatment for MRSA infections. Test performance is not FDA approved in patients less than 61 years old. Performed at [facility], [address] [place], [place] [address]     Radiology Reports No results found.  SIGNED: Deatra James, MD, FHM. FAAFP. Zacarias Pontes - [facility] Time spent > 55 min.  was spent in seeing, evaluating and examining the patient. Reviewing medical records, labs, drawn plan of care. Discussing with consultants including patient's son POA and palliative care team   [facility],  Pager (please use [email or web address] to page/  text) Please use Epic Secure Chat for non-urgent communication (7AM-7PM)  If 7PM-7AM, please contact night-coverage [email or web address], 09/11/2022, 11:10 AM

## 2022-09-11 NOTE — Care Management Important Message (Signed)
Important Message  Patient Details  Name: Carrie Crawford MRN: [id number] Date of Birth: 11/14/1925   Medicare Important Message Given:  Yes     Tommy Medal 09/11/2022, 10:19 AM

## 2022-09-11 NOTE — Progress Notes (Signed)
OT Cancellation Note  Patient Details Name: Carrie Crawford MRN: [id number] DOB: 18-Nov-1925   Cancelled Treatment:    Reason Eval/Treat Not Completed: Other (comment). Patient is currently on hospice care (comfort measures only).   Camara Rosander OT, MOT   Larey Seat 09/11/2022, 8:00 AM

## 2022-09-11 NOTE — Progress Notes (Signed)
PROGRESS NOTE    Patient: Carrie Crawford                            PCP: [name], FNP                    DOB: 1926-02-27            DOA: 09/09/2022 [id number]             DOS: 09/11/2022, 7:24 AM   LOS: 2 days   Date of Service: The patient was seen and examined on 09/11/2022  Subjective:   The patient was seen and examined this morning. Hemodynamically stable. No issues overnight .  Brief Narrative:   Carrie Crawford is a 89 year old female who is presenting from home presumably has been under hospice for the past year extensive history of chronic respiratory failure on 1.5 L liters of continuous oxygen, lung nodules, COPD, HTN, HLD, CHF.Marland Kitchen  Arthritis, CKD, dementia, osteopenia, .... Presented to ED with acute on chronic respiratory failure, hypoxia.  Per patient's son Mr. Carrie Crawford she is starting feeling ill, with congestion cough since last Tuesday with shortness of breath cough, despite treating with Zithromycin progressively gotten worse in past 24-36 hours.  He has increased her oxygen from 1.5 to 4/5 L of oxygen still remain hypoxic.   POA Mr. Carrie Crawford she has had pneumonia, hypoxia, lung nodule presumed cancer last year where she was discharged under hospice care.  Since then she has been stable doing well at home until this episode   ED course: Blood pressure (!) 123/46, pulse 93, temperature 98.1 F (36.7 C), temperature source Oral, resp. rate (!) 24, height '5\' 4"'$  (1.626 m), weight 87.1 kg, SpO2 100 %.  15 L of oxygen   CMP sodium 130 potassium 3.1 chloride 88, BUN 17, creatinine 1.01, calcium 7.4, GFR 52, BNP 284, troponin 9, lactic acid 1.3, 1.0, WBC 16.5, hemoglobin 10.1,  Respiratory panel including influenza A/B, SARS-CoV-2 all negative Chest x-ray-SCD on the right consistent with possible pneumonia   Was asked to admit acute respiratory failure likely pneumonia --POA has agreed for empiric antibiotics, supportive therapy, remains DNR  DNI    Assessment & Plan:   Principal Problem:   Sepsis ([place]) Active Problems:   Acute respiratory failure with hypoxia ([place])   Essential hypertension   Hyperlipidemia with target LDL less than 130   Dementia without behavioral disturbance (HCC)   Diastolic heart failure (HCC)   Anemia   Chronic kidney disease, stage 2 (mild)   Multifocal pneumonia   Leukocytosis   Pulmonary nodule   GERD (gastroesophageal reflux disease)   Bacteremia   Hypoxia     Assessment and Plan: * Sepsis ([place]) - Obtunded this morning Blood pressure (!) 113/54, pulse 95, temperature 98.3 F (36.8 C), RR (!) 23, weight 87.1 kg, SpO2 96 %.  On 5 L of oxygen (down from 15 L-Venturi mask)  - POA; met sepsis criteria with acute on chronic respiratory failure -WBC of 16.5 >>>> 10.2 -Lactic acid 1.3, 1.0 -Procalcitonin 5.73 -BNP 284.0 -Chest x-ray - 1. Interval increase in/new patchy airspace opacity in the right lung base, suspicious for infection. 2. Unchanged appearance of the left lung base with a possible layering left-sided pleural effusion. 3. Previously seen nodular opacity in the left upper lung is not visualized on this exam,   Therefore likely source of infection pneumonia versus aspiration pneumonia -Patient darted  on broad-spectrum antibiotics with cefepime and azithromycin, switched to IV Zosyn due to possibly aspiration pneumonia -Follow-up with blood and urine culture  -Continue supportive therapy with acute respiratory failure treating underlying causes POA son confirmed DNR/DNI status    Acute respiratory failure with hypoxia (HCC) -Baseline O2 demand 1.5 L -Subsequently required up to 15 L of oxygen, via Ventimask,>>> much improved now on 5 L of oxygen, satting 96%  -Likely source underlying pneumonia versus aspiration pneumonia -On empiric antibiotics--continue IV antibiotics -POA son has been contacted confirmed DNR/DNI status, -Continue supportive care -DuoNeb  bronchodilators, IV ABX  Hypoxia Acute on chronic likely due to pneumonia -Down to 5 L of oxygen from 15 satting 96%   Bacteremia -History of bacteremia approximately year ago will follow the blood cultures -Will treat accordingly  GERD (gastroesophageal reflux disease) Continue PPI  Pulmonary nodule - Chronic follow-up with outpatient -Per patient's son POA patient has been on the hospice  Leukocytosis Likely due to pneumonia, continue antibiotics  Multifocal pneumonia Treated with broad spectrum antibiotics of Zosyn (patient with a history of bacteremia) Will follow the blood cultures, will obtain sputum cultures  Chronic kidney disease, stage 2 (mild) BUN/creatinine at baseline -Monitoring, avoiding nephrotoxins Lab Results  Component Value Date   CREATININE 1.13 (H) 09/10/2022   CREATININE 1.01 (H) 09/09/2022   CREATININE 0.76 11/06/2021     Anemia History of anemia of chronic disease, and iron deficiency anemia,  Continue supplement iron,  currently H&H stable Monitoring  Diastolic heart failure (HCC) - Seems to be volume overloaded -possible crackles, lower extremity edema -Continue IV diuretics -Monitoring I's and O's,  Dementia without behavioral disturbance (HCC) - Stable with no behavior exacerbation, continue home medication of Aricept  Hyperlipidemia with target LDL less than 130 At her age, and chronic morbidities, risks outweighs the benefit of statins  Essential hypertension - Holding home medication include losartan -BP remained stable  Dehydration-resolved as of 09/10/2022 - Resuming gentle IV fluid hydration            ----------------------------------------------------------------------------------------------------------------------------------------------- Nutritional status:  The patient's BMI is: Body mass index is 30.35 kg/m. I agree with the assessment and plan as outlined below: Nutrition Status:            Skin Assessment: I have examined the patient's skin and I agree with the wound assessment as performed by wound care team As outlined belowe: Pressure Injury 10/05/21 Buttocks Right;Left Stage 2 -  Partial thickness loss of dermis presenting as a shallow open injury with a red, pink wound bed without slough. total area blanchable 5 cm X 4 cm, 2- 0.5cmx0.5cm stage 2 on each buttock (Active)  10/05/21 0203  Location: Buttocks  Location Orientation: Right;Left  Staging: Stage 2 -  Partial thickness loss of dermis presenting as a shallow open injury with a red, pink wound bed without slough.  Wound Description (Comments): total area blanchable 5 cm X 4 cm, 2- 0.5cmx0.5cm stage 2 on each buttock  Present on Admission:      Pressure Injury 09/09/22 Buttocks Left Stage 3 -  Full thickness tissue loss. Subcutaneous fat may be visible but bone, tendon or muscle are NOT exposed. (Active)  09/09/22 1800  Location: Buttocks  Location Orientation: Left  Staging: Stage 3 -  Full thickness tissue loss. Subcutaneous fat may be visible but bone, tendon or muscle are NOT exposed.  Wound Description (Comments):   Present on Admission: Yes  Dressing Type Foam - Lift dressing to assess site every shift 09/10/22  2022   --------------------------------------------------------------------------------------------------------------------------  DVT prophylaxis:  heparin injection 5,000 Units Start: 09/09/22 1445 TED hose Start: 09/09/22 1442 SCDs Start: 09/09/22 1442   Code Status:   Code Status: DNR  Family Communication: POA - Son updated  The above findings and plan of care has been discussed with patient (and family)  in detail,  they expressed understanding and agreement of above. -Advance care planning has been discussed.   Admission status:   Status is: Inpatient Remains inpatient appropriate because: needs IV ABX    Disposition: From  - home             Planning for discharge in 1-2  days: to Home w Hospice   Procedures:   No admission procedures for hospital encounter.   Antimicrobials:  Anti-infectives (From admission, onward)    Start     Dose/Rate Route Frequency Ordered Stop   09/10/22 0000  piperacillin-tazobactam (ZOSYN) IVPB 3.375 g        3.375 g 12.5 mL/hr over 240 Minutes Intravenous Every 8 hours 09/09/22 1517     09/09/22 1500  piperacillin-tazobactam (ZOSYN) IVPB 3.375 g        3.375 g 100 mL/hr over 30 Minutes Intravenous  Once 09/09/22 1459 09/09/22 1613   09/09/22 1445  cefTRIAXone (ROCEPHIN) 2 g in sodium chloride 0.9 % 100 mL IVPB  Status:  Discontinued        2 g 200 mL/hr over 30 Minutes Intravenous Every 24 hours 09/09/22 1444 09/09/22 1451   09/09/22 1445  azithromycin (ZITHROMAX) 500 mg in sodium chloride 0.9 % 250 mL IVPB  Status:  Discontinued        500 mg 250 mL/hr over 60 Minutes Intravenous Every 24 hours 09/09/22 1444 09/09/22 1451   09/09/22 1015  cefTRIAXone (ROCEPHIN) 1 g in sodium chloride 0.9 % 100 mL IVPB        1 g 200 mL/hr over 30 Minutes Intravenous  Once 09/09/22 1010 09/09/22 1123   09/09/22 1015  azithromycin (ZITHROMAX) 500 mg in sodium chloride 0.9 % 250 mL IVPB  Status:  Discontinued        500 mg 250 mL/hr over 60 Minutes Intravenous Every 24 hours 09/09/22 1010 09/09/22 1451        Medication:   acidophilus  1 capsule Oral Daily   ascorbic acid  500 mg Oral Daily   azelastine  2 spray Each Nare BID   dextromethorphan-guaiFENesin  1 tablet Oral BID   donepezil  5 mg Oral QHS   feeding supplement  237 mL Oral BID BM   fluticasone furoate-vilanterol  1 puff Inhalation Daily   And   umeclidinium bromide  1 puff Inhalation Daily   furosemide  20 mg Intravenous Q12H   glycopyrrolate  1 mg Oral BID   heparin  5,000 Units Subcutaneous Q8H   methylPREDNISolone (SOLU-MEDROL) injection  40 mg Intravenous Q12H   sodium chloride flush  3 mL Intravenous Q12H    acetaminophen **OR** acetaminophen, bisacodyl,  hydrALAZINE, HYDROmorphone (DILAUDID) injection, ipratropium, levalbuterol, ondansetron **OR** ondansetron (ZOFRAN) IV, oxyCODONE, senna-docusate, sodium chloride, sodium phosphate, traZODone   Objective:   Vitals:   09/10/22 1950 09/10/22 2355 09/11/22 0336 09/11/22 0500  BP: (!) 118/91 (!) 102/54 (!) 94/48   Pulse: 76 77 84   Resp: '14 20 16   '$ Temp: 98 F (36.7 C) 98 F (36.7 C) 98.7 F (37.1 C)   TempSrc: Oral Oral Oral   SpO2: 92% 98% 92%   Weight:  80.2 kg  Height:        Intake/Output Summary (Last 24 hours) at 09/11/2022 0724 Last data filed at 09/10/2022 2300 Gross per 24 hour  Intake --  Output 350 ml  Net -350 ml   Filed Weights   09/09/22 0955 09/10/22 1215 09/11/22 0500  Weight: 87.1 kg 79.8 kg 80.2 kg     Physical examination:   Constitution:  Alert, cooperative, no distress,  Appears calm and comfortable  Psychiatric:   Normal and stable mood and affect, cognition intact,   HEENT:        Normocephalic, PERRL, otherwise with in Normal limits  Chest:         Chest symmetric Cardio vascular:  S1/S2, RRR, No murmure, No Rubs or Gallops  pulmonary: Clear to auscultation bilaterally, respirations unlabored, negative wheezes / crackles Abdomen: Soft, non-tender, non-distended, bowel sounds,no masses, no organomegaly Muscular skeletal: Limited exam - in bed, able to move all 4 extremities,   Neuro: CNII-XII intact. , normal motor and sensation, reflexes intact  Extremities: No pitting edema lower extremities, +2 pulses  Skin: Dry, warm to touch, negative for any Rashes, No open wounds Wounds: per nursing documentation   ------------------------------------------------------------------------------------------------------------------------------------------    LABs:     Latest Ref Rng & Units 09/11/2022    4:02 AM 09/10/2022    5:05 AM 09/09/2022   10:08 AM  CBC  WBC 4.0 - 10.5 K/uL 12.0  10.2  16.5   Hemoglobin 12.0 - 15.0 g/dL 9.8  9.4  10.1   Hematocrit  36.0 - 46.0 % 29.9  29.8  31.1   Platelets 150 - 400 K/uL 467  394  392       Latest Ref Rng & Units 09/11/2022    4:02 AM 09/10/2022    5:05 AM 09/09/2022   11:45 AM  CMP  Glucose 70 - 99 mg/dL 136  141  136   BUN 8 - 23 mg/dL 36  23  17   Creatinine 0.44 - 1.00 mg/dL 1.34  1.13  1.01   Sodium 135 - 145 mmol/L 135  132  130   Potassium 3.5 - 5.1 mmol/L 3.6  4.3  3.1   Chloride 98 - 111 mmol/L 88  90  88   CO2 22 - 32 mmol/L 36  35  32   Calcium 8.9 - 10.3 mg/dL 7.4  7.3  7.4   Total Protein 6.5 - 8.1 g/dL   7.1   Total Bilirubin 0.3 - 1.2 mg/dL   0.3   Alkaline Phos 38 - 126 U/L   75   AST 15 - 41 U/L   20   ALT 0 - 44 U/L   12        Micro Results Recent Results (from the past 240 hour(s))  Resp panel by RT-PCR (RSV, Flu A&B, Covid) Anterior Nasal Swab     Status: None   Collection Time: 09/09/22 10:08 AM   Specimen: Anterior Nasal Swab  Result Value Ref Range Status   SARS Coronavirus 2 by RT PCR NEGATIVE NEGATIVE Final    Comment: (NOTE) SARS-CoV-2 target nucleic acids are NOT DETECTED.  The SARS-CoV-2 RNA is generally detectable in upper respiratory specimens during the acute phase of infection. The lowest concentration of SARS-CoV-2 viral copies this assay can detect is 138 copies/mL. A negative result does not preclude SARS-Cov-2 infection and should not be used as the sole basis for treatment or other patient management decisions. A negative result may occur  with  improper specimen collection/handling, submission of specimen other than nasopharyngeal swab, presence of viral mutation(s) within the areas targeted by this assay, and inadequate number of viral copies(<138 copies/mL). A negative result must be combined with clinical observations, patient history, and epidemiological information. The expected result is Negative.  Fact Sheet for Patients:  EntrepreneurPulse.com.au  Fact Sheet for Healthcare Providers:   IncredibleEmployment.be  This test is no t yet approved or cleared by the [place] FDA and  has been authorized for detection and/or diagnosis of SARS-CoV-2 by FDA under an Emergency Use Authorization (EUA). This EUA will remain  in effect (meaning this test can be used) for the duration of the COVID-19 declaration under Section 564(b)(1) of the Act, 21 U.S.C.section 360bbb-3(b)(1), unless the authorization is terminated  or revoked sooner.       Influenza A by PCR NEGATIVE NEGATIVE Final   Influenza B by PCR NEGATIVE NEGATIVE Final    Comment: (NOTE) The Xpert Xpress SARS-CoV-2/FLU/RSV plus assay is intended as an aid in the diagnosis of influenza from Nasopharyngeal swab specimens and should not be used as a sole basis for treatment. Nasal washings and aspirates are unacceptable for Xpert Xpress SARS-CoV-2/FLU/RSV testing.  Fact Sheet for Patients: EntrepreneurPulse.com.au  Fact Sheet for Healthcare Providers: IncredibleEmployment.be  This test is not yet approved or cleared by the [place] FDA and has been authorized for detection and/or diagnosis of SARS-CoV-2 by FDA under an Emergency Use Authorization (EUA). This EUA will remain in effect (meaning this test can be used) for the duration of the COVID-19 declaration under Section 564(b)(1) of the Act, 21 U.S.C. section 360bbb-3(b)(1), unless the authorization is terminated or revoked.     Resp Syncytial Virus by PCR NEGATIVE NEGATIVE Final    Comment: (NOTE) Fact Sheet for Patients: EntrepreneurPulse.com.au  Fact Sheet for Healthcare Providers: IncredibleEmployment.be  This test is not yet approved or cleared by the [place] FDA and has been authorized for detection and/or diagnosis of SARS-CoV-2 by FDA under an Emergency Use Authorization (EUA). This EUA will remain in effect (meaning this test can be used) for  the duration of the COVID-19 declaration under Section 564(b)(1) of the Act, 21 U.S.C. section 360bbb-3(b)(1), unless the authorization is terminated or revoked.  Performed at [facility], [address], [place], [place] [address]   Culture, blood (routine x 2) Call MD if unable to obtain prior to antibiotics being given     Status: None (Preliminary result)   Collection Time: 09/09/22  3:40 PM   Specimen: BLOOD  Result Value Ref Range Status   Specimen Description BLOOD BLOOD LEFT ARM  Final   Special Requests   Final    BOTTLES DRAWN AEROBIC AND ANAEROBIC Blood Culture adequate volume   Culture   Final    NO GROWTH 2 DAYS Performed at [facility], [address], [place], [place] [address]    Report Status PENDING  Incomplete  Culture, blood (routine x 2) Call MD if unable to obtain prior to antibiotics being given     Status: None (Preliminary result)   Collection Time: 09/09/22  3:40 PM   Specimen: BLOOD  Result Value Ref Range Status   Specimen Description BLOOD BLOOD LEFT ARM  Final   Special Requests   Final    BOTTLES DRAWN AEROBIC AND ANAEROBIC Blood Culture results may not be optimal due to an excessive volume of blood received in culture bottles   Culture   Final    NO GROWTH 2  DAYS Performed at [facility], [address], [place], [place] [address]    Report Status PENDING  Incomplete  MRSA Next Gen by PCR, Nasal     Status: None   Collection Time: 09/10/22  4:40 PM   Specimen: Nasal Mucosa; Nasal Swab  Result Value Ref Range Status   MRSA by PCR Next Gen NOT DETECTED NOT DETECTED Final    Comment: (NOTE) The GeneXpert MRSA Assay (FDA approved for NASAL specimens only), is one component of a comprehensive MRSA colonization surveillance program. It is not intended to diagnose MRSA infection nor to guide or monitor treatment for MRSA infections. Test performance is not FDA approved in patients less than 45 years old. Performed at [facility], [address] [place], [place] [address]     Radiology Reports No results found.  SIGNED: Deatra James, MD, FHM. FAAFP. Zacarias Pontes - [facility] Time spent > 35 min.  In seeing, evaluating and examining the patient. Reviewing medical records, labs, drawn plan of care. [facility],  Pager (please use [email or web address] to page/ text) Please use Epic Secure Chat for non-urgent communication (7AM-7PM)  If 7PM-7AM, please contact night-coverage [email or web address], 09/11/2022, 7:24 AM

## 2022-09-12 DIAGNOSIS — J9621 Acute and chronic respiratory failure with hypoxia: Secondary | ICD-10-CM

## 2022-09-12 DIAGNOSIS — J189 Pneumonia, unspecified organism: Secondary | ICD-10-CM

## 2022-09-12 DIAGNOSIS — A419 Sepsis, unspecified organism: Secondary | ICD-10-CM | POA: Diagnosis not present

## 2022-09-12 LAB — BASIC METABOLIC PANEL
Anion gap: 11 (ref 5–15)
BUN: 43 mg/dL — ABNORMAL HIGH (ref 8–23)
CO2: 39 mmol/L — ABNORMAL HIGH (ref 22–32)
Calcium: 7.7 mg/dL — ABNORMAL LOW (ref 8.9–10.3)
Chloride: 89 mmol/L — ABNORMAL LOW (ref 98–111)
Creatinine, Ser: 1.25 mg/dL — ABNORMAL HIGH (ref 0.44–1.00)
GFR, Estimated: 40 mL/min — ABNORMAL LOW (ref >60)
Glucose, Bld: 141 mg/dL — ABNORMAL HIGH (ref 70–99)
Potassium: 3.4 mmol/L — ABNORMAL LOW (ref 3.5–5.1)
Sodium: 139 mmol/L (ref 135–145)

## 2022-09-12 LAB — CBC
HCT: 30.2 % — ABNORMAL LOW (ref 36.0–46.0)
Hemoglobin: 9.8 g/dL — ABNORMAL LOW (ref 12.0–15.0)
MCH: 31.9 pg (ref 26.0–34.0)
MCHC: 32.5 g/dL (ref 30.0–36.0)
MCV: 98.4 fL (ref 80.0–100.0)
Platelets: 474 10*3/uL — ABNORMAL HIGH (ref 150–400)
RBC: 3.07 MIL/uL — ABNORMAL LOW (ref 3.87–5.11)
RDW: 12.7 % (ref 11.5–15.5)
WBC: 13.9 10*3/uL — ABNORMAL HIGH (ref 4.0–10.5)
nRBC: 0.1 % (ref 0.0–0.2)

## 2022-09-12 LAB — GLUCOSE, CAPILLARY: Glucose-Capillary: 172 mg/dL — ABNORMAL HIGH (ref 70–99)

## 2022-09-12 NOTE — Progress Notes (Signed)
PROGRESS NOTE    Patient: Carrie Crawford                            PCP: [name], FNP                    DOB: [birth year]            DOA: 09/09/2022 NR:[id number]             DOS: 09/12/2022, 11:21 AM   LOS: 3 days   Date of Service: The patient was seen and examined on 09/12/2022  Subjective:   The patient was seen and examined this morning, stable no acute distress Much more awake alert oriented, following command, pleasantly confused Patient back to baseline Satting 92% on 3 L of oxygen --- down from 5 L  Brief Narrative:   Carrie Crawford is a 89 year old female who is presenting from home presumably has been under hospice for the past year extensive history of chronic respiratory failure on 1.5 L liters of continuous oxygen, lung nodules, COPD, HTN, HLD, CHF.Marland Kitchen  Arthritis, CKD, dementia, osteopenia, .... Presented to ED with acute on chronic respiratory failure, hypoxia.  Per patient's son Mr. [name] she is starting feeling ill, with congestion cough since last Tuesday with shortness of breath cough, despite treating with Zithromycin progressively gotten worse in past 24-36 hours.  He has increased her oxygen from 1.5 to 4/5 L of oxygen still remain hypoxic.   POA Mr. [name] she has had pneumonia, hypoxia, lung nodule presumed cancer last year where she was discharged under hospice care.  Since then she has been stable doing well at home until this episode   ED course: Blood pressure (!) 123/46, pulse 93, temperature 98.1 F (36.7 C), temperature source Oral, resp. rate (!) 24, height '5\' 4"'$  (1.626 m), weight 87.1 kg, SpO2 100 %.  15 L of oxygen   CMP sodium 130 potassium 3.1 chloride 88, BUN 17, creatinine 1.01, calcium 7.4, GFR 52, BNP 284, troponin 9, lactic acid 1.3, 1.0, WBC 16.5, hemoglobin 10.1,  Respiratory panel including influenza A/B, SARS-CoV-2 all negative Chest x-ray-SCD on the right consistent with possible pneumonia   Was asked to admit acute  respiratory failure likely pneumonia --POA has agreed for empiric antibiotics, supportive therapy, remains DNR DNI    Assessment & Plan:   Principal Problem:   Sepsis ([place]) Active Problems:   Acute respiratory failure with hypoxia ([place])   Essential hypertension   Hyperlipidemia with target LDL less than 130   Dementia without behavioral disturbance (HCC)   Diastolic heart failure (HCC)   Anemia   Chronic kidney disease, stage 2 (mild)   Multifocal pneumonia   Leukocytosis   Pulmonary nodule   GERD (gastroesophageal reflux disease)   Bacteremia   Hypoxia     Assessment and Plan: * Sepsis ([place]) -Resolved sepsis physiology -O2 saturation 92% on 3 L of oxygen--- down from 15 L to 5 L 3 L   - POA; met sepsis criteria with acute on chronic respiratory failure -WBC of 16.5 >>>> 10.2 -Lactic acid 1.3, 1.0 -Procalcitonin 5.73 -BNP 284.0 -Chest x-ray - 1. Interval increase in/new patchy airspace opacity in the right lung base, suspicious for infection. 2. Unchanged appearance of the left lung base with a possible layering left-sided pleural effusion. 3. Previously seen nodular opacity in the left upper lung is not visualized on this exam,   Therefore likely  source of infection pneumonia versus aspiration pneumonia -Patient darted on broad-spectrum antibiotics with cefepime and azithromycin, switched to IV Zosyn due to possibly aspiration pneumonia -Follow-up with blood and urine culture  -Continue supportive therapy with acute respiratory failure treating underlying causes POA son confirmed DNR/DNI status    Acute respiratory failure with hypoxia (HCC) -Shortness of breath and hypoxia has improved with -Baseline O2 demand 1.5 L -Subsequently required up to 15 L of oxygen, via Ventimask,>>> much improved now on 5 L of oxygen, satting 92%  -Likely source underlying pneumonia versus aspiration pneumonia -On empiric antibiotics--continue IV antibiotics -POA son has been  contacted confirmed DNR/DNI status, -Continue supportive care -DuoNeb bronchodilators, IV ABX  Hypoxia -Improved Acute on chronic likely due to pneumonia -Down to 5 L of oxygen from 15 satting 96%   Bacteremia -History of bacteremia approximately year ago will follow the blood cultures -Will treat accordingly -Blood cultures remain negative to date  GERD (gastroesophageal reflux disease) Continue PPI  Pulmonary nodule - Chronic follow-up with outpatient -Per patient's son POA patient has been on the hospice  Leukocytosis Likely due to pneumonia, continue antibiotics  Multifocal pneumonia Treated with broad spectrum antibiotics of Zosyn (patient with a history of bacteremia) Will follow the blood cultures, will obtain sputum cultures  Chronic kidney disease, stage 2 (mild) BUN/creatinine at baseline -Monitoring, avoiding nephrotoxins Lab Results  Component Value Date   CREATININE 1.25 (H) 09/12/2022   CREATININE 1.34 (H) 09/11/2022   CREATININE 1.13 (H) 09/10/2022       Anemia History of anemia of chronic disease, and iron deficiency anemia,  Continue supplement iron,     Latest Ref Rng & Units 09/12/2022    4:03 AM 09/11/2022    4:02 AM 09/10/2022    5:05 AM  CBC  WBC 4.0 - 10.5 K/uL 13.9  12.0  10.2   Hemoglobin 12.0 - 15.0 g/dL 9.8  9.8  9.4   Hematocrit 36.0 - 46.0 % 30.2  29.9  29.8   Platelets 150 - 400 K/uL 474  467  [id number]      Diastolic heart failure (HCC) - Seems to be volume overloaded -possible crackles, lower extremity edema -Continue IV diuretics -Monitoring I's and O's,  Intake/Output Summary (Last 24 hours) at 09/12/2022 1121 Last data filed at 09/12/2022 0853 Gross per 24 hour  Intake 240 ml  Output 1700 ml  Net -1460 ml     Dementia without behavioral disturbance (HCC) - Stable with no behavior exacerbation, continue home medication of Aricept  Hyperlipidemia with target LDL less than 130 At her age, and chronic morbidities, risks  outweighs the benefit of statins  Essential hypertension -hypotensive - Holding home medication include losartan -Monitoring BP closely   Skin Assessment: I have examined the patient's skin and I agree with the wound assessment as performed by wound care team As outlined belowe: Pressure Injury 10/05/21 Buttocks Right;Left Stage 2 -  Partial thickness loss of dermis presenting as a shallow open injury with a red, pink wound bed without slough. total area blanchable 5 cm X 4 cm, 2- 0.5cmx0.5cm stage 2 on each buttock (Active)  10/05/21 0203  Location: Buttocks  Location Orientation: Right;Left  Staging: Stage 2 -  Partial thickness loss of dermis presenting as a shallow open injury with a red, pink wound bed without slough.  Wound Description (Comments): total area blanchable 5 cm X 4 cm, 2- 0.5cmx0.5cm stage 2 on each buttock  Present on Admission:      Pressure Injury  09/09/22 Buttocks Left Stage 3 -  Full thickness tissue loss. Subcutaneous fat may be visible but bone, tendon or muscle are NOT exposed. (Active)  09/09/22 1800  Location: Buttocks  Location Orientation: Left  Staging: Stage 3 -  Full thickness tissue loss. Subcutaneous fat may be visible but bone, tendon or muscle are NOT exposed.  Wound Description (Comments):   Present on Admission: Yes  Dressing Type Foam - Lift dressing to assess site every shift 09/11/22 2102     ------------------------------------------------------------------------------------------------------------------------- Cultures; Blood Cultures x 2 >> NGT -------------------------------------------------------------------------------------------------------------------------  DVT prophylaxis:  heparin injection 5,000 Units Start: 09/09/22 1445 TED hose Start: 09/09/22 1442 SCDs Start: 09/09/22 1442   Code Status:   Code Status: DNR  Family Communication: No family member present at bedside-  Plan of care was discussed with patient's POA  Mr. Harriet Pho at [phone]  The above findings and plan of care has been discussed with patient's son POA in detail,  they expressed understanding and agreement of above. -Advance care planning has been discussed.   Admission status:   Status is: Inpatient Remains inpatient appropriate because: Needing IV antibiotics, IV diuretics,   Disposition: From  - home             Planning for discharge in 1-2 days: to   Procedures:   No admission procedures for hospital encounter.   Antimicrobials:  Anti-infectives (From admission, onward)    Start     Dose/Rate Route Frequency Ordered Stop   09/10/22 0000  piperacillin-tazobactam (ZOSYN) IVPB 3.375 g        3.375 g 12.5 mL/hr over 240 Minutes Intravenous Every 8 hours 09/09/22 1517     09/09/22 1500  piperacillin-tazobactam (ZOSYN) IVPB 3.375 g        3.375 g 100 mL/hr over 30 Minutes Intravenous  Once 09/09/22 1459 09/09/22 1613   09/09/22 1445  cefTRIAXone (ROCEPHIN) 2 g in sodium chloride 0.9 % 100 mL IVPB  Status:  Discontinued        2 g 200 mL/hr over 30 Minutes Intravenous Every 24 hours 09/09/22 1444 09/09/22 1451   09/09/22 1445  azithromycin (ZITHROMAX) 500 mg in sodium chloride 0.9 % 250 mL IVPB  Status:  Discontinued        500 mg 250 mL/hr over 60 Minutes Intravenous Every 24 hours 09/09/22 1444 09/09/22 1451   09/09/22 1015  cefTRIAXone (ROCEPHIN) 1 g in sodium chloride 0.9 % 100 mL IVPB        1 g 200 mL/hr over 30 Minutes Intravenous  Once 09/09/22 1010 09/09/22 1123   09/09/22 1015  azithromycin (ZITHROMAX) 500 mg in sodium chloride 0.9 % 250 mL IVPB  Status:  Discontinued        500 mg 250 mL/hr over 60 Minutes Intravenous Every 24 hours 09/09/22 1010 09/09/22 1451        Medication:   acidophilus  1 capsule Oral Daily   ascorbic acid  500 mg Oral Daily   azelastine  2 spray Each Nare BID   dextromethorphan-guaiFENesin  1 tablet Oral BID   donepezil  5 mg Oral QHS   feeding supplement  237 mL Oral BID  BM   fluticasone furoate-vilanterol  1 puff Inhalation Daily   And   umeclidinium bromide  1 puff Inhalation Daily   furosemide  20 mg Intravenous Q12H   glycopyrrolate  1 mg Oral BID   heparin  5,000 Units Subcutaneous Q8H   methylPREDNISolone (SOLU-MEDROL) injection  40 mg Intravenous  Q12H   sodium chloride flush  3 mL Intravenous Q12H    acetaminophen **OR** acetaminophen, bisacodyl, hydrALAZINE, HYDROmorphone (DILAUDID) injection, ipratropium, levalbuterol, ondansetron **OR** ondansetron (ZOFRAN) IV, oxyCODONE, senna-docusate, sodium chloride, sodium phosphate, traZODone   Objective:   Vitals:   09/11/22 2031 09/12/22 0414 09/12/22 0500 09/12/22 0848  BP: (!) 111/52 (!) 141/67    Pulse: 97 92    Resp: 20 20    Temp: 98 F (36.7 C) 98 F (36.7 C)    TempSrc:      SpO2: 97% 97%  92%  Weight:   72 kg   Height:        Intake/Output Summary (Last 24 hours) at 09/12/2022 1121 Last data filed at 09/12/2022 0853 Gross per 24 hour  Intake 240 ml  Output 1700 ml  Net -1460 ml   Filed Weights   09/10/22 1215 09/11/22 0500 09/12/22 0500  Weight: 79.8 kg 80.2 kg 72 kg     Physical examination:        General:  AAO x 1,  cooperative, no distress;   HEENT:  Normocephalic, PERRL, otherwise with in Normal limits   Neuro:  CNII-XII intact. , normal motor and sensation, reflexes intact   Lungs:   Clear to auscultation BL, Respirations unlabored,  No wheezes / crackles  Cardio:    S1/S2, RRR, No murmure, No Rubs or Gallops   Abdomen:  Soft, non-tender, bowel sounds active all four quadrants, no guarding or peritoneal signs.  Muscular  skeletal:  Limited exam - sever global generalized weaknesses - in bed, able to move all 4 extremities,   2+ pulses,  symmetric, No pitting edema  Skin:  Dry, warm to touch, negative for any Rashes,  Wounds: Please see nursing documentation  Pressure Injury 10/05/21 Buttocks Right;Left Stage 2 -  Partial thickness loss of dermis presenting as  a shallow open injury with a red, pink wound bed without slough. total area blanchable 5 cm X 4 cm, 2- 0.5cmx0.5cm stage 2 on each buttock (Active)  10/05/21 0203  Location: Buttocks  Location Orientation: Right;Left  Staging: Stage 2 -  Partial thickness loss of dermis presenting as a shallow open injury with a red, pink wound bed without slough.  Wound Description (Comments): total area blanchable 5 cm X 4 cm, 2- 0.5cmx0.5cm stage 2 on each buttock  Present on Admission:      Pressure Injury 09/09/22 Buttocks Left Stage 3 -  Full thickness tissue loss. Subcutaneous fat may be visible but bone, tendon or muscle are NOT exposed. (Active)  09/09/22 1800  Location: Buttocks  Location Orientation: Left  Staging: Stage 3 -  Full thickness tissue loss. Subcutaneous fat may be visible but bone, tendon or muscle are NOT exposed.  Wound Description (Comments):   Present on Admission: Yes  Dressing Type Foam - Lift dressing to assess site every shift 09/11/22 2102          ------------------------------------------------------------------------------------------------------------------------------------------    LABs:     Latest Ref Rng & Units 09/12/2022    4:03 AM 09/11/2022    4:02 AM 09/10/2022    5:05 AM  CBC  WBC 4.0 - 10.5 K/uL 13.9  12.0  10.2   Hemoglobin 12.0 - 15.0 g/dL 9.8  9.8  9.4   Hematocrit 36.0 - 46.0 % 30.2  29.9  29.8   Platelets 150 - 400 K/uL 474  467  394       Latest Ref Rng & Units 09/12/2022    4:03  AM 09/11/2022    4:02 AM 09/10/2022    5:05 AM  CMP  Glucose 70 - 99 mg/dL 141  136  141   BUN 8 - 23 mg/dL 43  36  23   Creatinine 0.44 - 1.00 mg/dL 1.25  1.34  1.13   Sodium 135 - 145 mmol/L 139  135  132   Potassium 3.5 - 5.1 mmol/L 3.4  3.6  4.3   Chloride 98 - 111 mmol/L 89  88  90   CO2 22 - 32 mmol/L 39  36  35   Calcium 8.9 - 10.3 mg/dL 7.7  7.4  7.3        Micro Results Recent Results (from the past 240 hour(s))  Resp panel by RT-PCR (RSV, Flu A&B,  Covid) Anterior Nasal Swab     Status: None   Collection Time: 09/09/22 10:08 AM   Specimen: Anterior Nasal Swab  Result Value Ref Range Status   SARS Coronavirus 2 by RT PCR NEGATIVE NEGATIVE Final    Comment: (NOTE) SARS-CoV-2 target nucleic acids are NOT DETECTED.  The SARS-CoV-2 RNA is generally detectable in upper respiratory specimens during the acute phase of infection. The lowest concentration of SARS-CoV-2 viral copies this assay can detect is 138 copies/mL. A negative result does not preclude SARS-Cov-2 infection and should not be used as the sole basis for treatment or other patient management decisions. A negative result may occur with  improper specimen collection/handling, submission of specimen other than nasopharyngeal swab, presence of viral mutation(s) within the areas targeted by this assay, and inadequate number of viral copies(<138 copies/mL). A negative result must be combined with clinical observations, patient history, and epidemiological information. The expected result is Negative.  Fact Sheet for Patients:  EntrepreneurPulse.com.au  Fact Sheet for Healthcare Providers:  IncredibleEmployment.be  This test is no t yet approved or cleared by the [place] FDA and  has been authorized for detection and/or diagnosis of SARS-CoV-2 by FDA under an Emergency Use Authorization (EUA). This EUA will remain  in effect (meaning this test can be used) for the duration of the COVID-19 declaration under Section 564(b)(1) of the Act, 21 U.S.C.section 360bbb-3(b)(1), unless the authorization is terminated  or revoked sooner.       Influenza A by PCR NEGATIVE NEGATIVE Final   Influenza B by PCR NEGATIVE NEGATIVE Final    Comment: (NOTE) The Xpert Xpress SARS-CoV-2/FLU/RSV plus assay is intended as an aid in the diagnosis of influenza from Nasopharyngeal swab specimens and should not be used as a sole basis for treatment. Nasal  washings and aspirates are unacceptable for Xpert Xpress SARS-CoV-2/FLU/RSV testing.  Fact Sheet for Patients: EntrepreneurPulse.com.au  Fact Sheet for Healthcare Providers: IncredibleEmployment.be  This test is not yet approved or cleared by the [place] FDA and has been authorized for detection and/or diagnosis of SARS-CoV-2 by FDA under an Emergency Use Authorization (EUA). This EUA will remain in effect (meaning this test can be used) for the duration of the COVID-19 declaration under Section 564(b)(1) of the Act, 21 U.S.C. section 360bbb-3(b)(1), unless the authorization is terminated or revoked.     Resp Syncytial Virus by PCR NEGATIVE NEGATIVE Final    Comment: (NOTE) Fact Sheet for Patients: EntrepreneurPulse.com.au  Fact Sheet for Healthcare Providers: IncredibleEmployment.be  This test is not yet approved or cleared by the [place] FDA and has been authorized for detection and/or diagnosis of SARS-CoV-2 by FDA under an Emergency Use Authorization (EUA). This EUA will remain in  effect (meaning this test can be used) for the duration of the COVID-19 declaration under Section 564(b)(1) of the Act, 21 U.S.C. section 360bbb-3(b)(1), unless the authorization is terminated or revoked.  Performed at [facility], [address], [place], [place] [address]   Culture, blood (routine x 2) Call MD if unable to obtain prior to antibiotics being given     Status: None (Preliminary result)   Collection Time: 09/09/22  3:40 PM   Specimen: BLOOD  Result Value Ref Range Status   Specimen Description BLOOD BLOOD LEFT ARM  Final   Special Requests   Final    BOTTLES DRAWN AEROBIC AND ANAEROBIC Blood Culture adequate volume   Culture   Final    NO GROWTH 3 DAYS Performed at [facility], [address] [place], [place] [address]    Report Status PENDING  Incomplete  Culture, blood (routine x 2) Call  MD if unable to obtain prior to antibiotics being given     Status: None (Preliminary result)   Collection Time: 09/09/22  3:40 PM   Specimen: BLOOD  Result Value Ref Range Status   Specimen Description BLOOD BLOOD LEFT ARM  Final   Special Requests   Final    BOTTLES DRAWN AEROBIC AND ANAEROBIC Blood Culture results may not be optimal due to an excessive volume of blood received in culture bottles   Culture   Final    NO GROWTH 3 DAYS Performed at [facility], [address], [place], [place] [address]    Report Status PENDING  Incomplete  MRSA Next Gen by PCR, Nasal     Status: None   Collection Time: 09/10/22  4:40 PM   Specimen: Nasal Mucosa; Nasal Swab  Result Value Ref Range Status   MRSA by PCR Next Gen NOT DETECTED NOT DETECTED Final    Comment: (NOTE) The GeneXpert MRSA Assay (FDA approved for NASAL specimens only), is one component of a comprehensive MRSA colonization surveillance program. It is not intended to diagnose MRSA infection nor to guide or monitor treatment for MRSA infections. Test performance is not FDA approved in patients less than 8 years old. Performed at [facility], [address], [place], [place] [address]     Radiology Reports No results found.  SIGNED: Deatra James, MD, FHM. FAAFP. Zacarias Pontes - [facility] Time spent > 55 min.  was spent in seeing, evaluating and examining the patient. Reviewing medical records, labs, drawn plan of care. Discussing with consultants including patient's son POA and palliative care team   [facility],  Pager (please use [email or web address] to page/ text) Please use Epic Secure Chat for non-urgent communication (7AM-7PM)  If 7PM-7AM, please contact night-coverage [email or web address], 09/12/2022, 11:21 AM

## 2022-09-13 DIAGNOSIS — A419 Sepsis, unspecified organism: Secondary | ICD-10-CM | POA: Diagnosis not present

## 2022-09-13 LAB — CBC
HCT: 31.2 % — ABNORMAL LOW (ref 36.0–46.0)
Hemoglobin: 10.2 g/dL — ABNORMAL LOW (ref 12.0–15.0)
MCH: 32.2 pg (ref 26.0–34.0)
MCHC: 32.7 g/dL (ref 30.0–36.0)
MCV: 98.4 fL (ref 80.0–100.0)
Platelets: 479 10*3/uL — ABNORMAL HIGH (ref 150–400)
RBC: 3.17 MIL/uL — ABNORMAL LOW (ref 3.87–5.11)
RDW: 12.5 % (ref 11.5–15.5)
WBC: 13.3 10*3/uL — ABNORMAL HIGH (ref 4.0–10.5)
nRBC: 0.1 % (ref 0.0–0.2)

## 2022-09-13 LAB — BASIC METABOLIC PANEL
Anion gap: 11 (ref 5–15)
BUN: 45 mg/dL — ABNORMAL HIGH (ref 8–23)
CO2: 40 mmol/L — ABNORMAL HIGH (ref 22–32)
Calcium: 7.7 mg/dL — ABNORMAL LOW (ref 8.9–10.3)
Chloride: 88 mmol/L — ABNORMAL LOW (ref 98–111)
Creatinine, Ser: 1.15 mg/dL — ABNORMAL HIGH (ref 0.44–1.00)
GFR, Estimated: 44 mL/min — ABNORMAL LOW (ref >60)
Glucose, Bld: 132 mg/dL — ABNORMAL HIGH (ref 70–99)
Potassium: 3.3 mmol/L — ABNORMAL LOW (ref 3.5–5.1)
Sodium: 139 mmol/L (ref 135–145)

## 2022-09-13 LAB — GLUCOSE, CAPILLARY: Glucose-Capillary: 260 mg/dL — ABNORMAL HIGH (ref 70–99)

## 2022-09-13 MED ORDER — PREDNISONE 20 MG PO TABS: 50.0000 mg | ORAL_TABLET | Freq: Every day | ORAL | Status: DC

## 2022-09-13 MED ORDER — FUROSEMIDE 40 MG PO TABS
40.0000 mg | ORAL_TABLET | Freq: Every day | ORAL | Status: DC
  Filled 2022-09-13: qty 1

## 2022-09-13 MED ORDER — LEVOFLOXACIN 250 MG PO TABS: 250.0000 mg | ORAL_TABLET | Freq: Every day | ORAL | 0 refills | Status: AC

## 2022-09-13 MED ORDER — LEVOFLOXACIN 500 MG PO TABS
250.0000 mg | ORAL_TABLET | Freq: Every day | ORAL | Status: DC
  Administered 2022-09-13: 250 mg via ORAL
  Filled 2022-09-13: qty 1

## 2022-09-13 MED ORDER — POTASSIUM CHLORIDE CRYS ER 20 MEQ PO TBCR
40.0000 meq | EXTENDED_RELEASE_TABLET | Freq: Once | ORAL | Status: AC
  Administered 2022-09-13: 40 meq via ORAL
  Filled 2022-09-13: qty 2

## 2022-09-13 MED ORDER — METHYLPREDNISOLONE 4 MG PO TBPK: ORAL_TABLET | ORAL | 0 refills | Status: DC

## 2022-09-13 NOTE — Progress Notes (Signed)
Patient slept through this shift only waking when vitals needed to be taken and medications given.

## 2022-09-13 NOTE — Progress Notes (Signed)
Spoke with patient's son regarding continuous need for oxygen. He advised that patient has home concentrator and portable tanks currently provided by [employer]. Son advised that patient will be transported home via ambulance. Updated order requested from attending indicating continuous oxygen.   Serenitee Fuertes, Clydene Pugh, LCSW

## 2022-09-13 NOTE — Progress Notes (Addendum)
Patient's son was notified of discharge home. Patient's son states that someone will be home when ems arrives with patient. Attempted to give the son instructions from discharge paperwork. The son states he will call if he has questions.

## 2022-09-13 NOTE — Discharge Summary (Signed)
Physician Discharge Summary   Patient: Carrie Crawford MRN: [id number] DOB: 12-Aug-1925  Admit date:     09/09/2022  Discharge date: 09/13/22  Discharge Physician: Deatra James   PCP: [name], FNP   Recommendations at discharge:   Follow-up with outpatient with hospice.  Discharge Diagnoses: Principal Problem:   Sepsis ([place]) Active Problems:   Acute respiratory failure with hypoxia (HCC)   Essential hypertension   Hyperlipidemia with target LDL less than 130   Dementia without behavioral disturbance (HCC)   Diastolic heart failure (HCC)   Anemia   Chronic kidney disease, stage 2 (mild)   Multifocal pneumonia   Leukocytosis   Pulmonary nodule   GERD (gastroesophageal reflux disease)   Bacteremia   Hypoxia  Resolved Problems:   Chronic respiratory failure with hypoxia [facility])   Dehydration  Hospital Course: Carrie Crawford is a 89 year old female who is presenting from home presumably has been under hospice for the past year extensive history of chronic respiratory failure on 1.5 L liters of continuous oxygen, lung nodules, COPD, HTN, HLD, CHF.Marland Kitchen  Arthritis, CKD, dementia, osteopenia, .... Presented to ED with acute on chronic respiratory failure, hypoxia.  Per patient's son Mr. [name] she is starting feeling ill, with congestion cough since last Tuesday with shortness of breath cough, despite treating with Zithromycin progressively gotten worse in past 24-36 hours.  He has increased her oxygen from 1.5 to 4/5 L of oxygen still remain hypoxic.   POA Mr. [name] she has had pneumonia, hypoxia, lung nodule presumed cancer last year where she was discharged under hospice care.  Since then she has been stable doing well at home until this episode   ED course: Blood pressure (!) 123/46, pulse 93, temperature 98.1 F (36.7 C), temperature source Oral, resp. rate (!) 24, height '5\' 4"'$  (1.626 m), weight 87.1 kg, SpO2 100 %.  15 L of oxygen   CMP sodium 130  potassium 3.1 chloride 88, BUN 17, creatinine 1.01, calcium 7.4, GFR 52, BNP 284, troponin 9, lactic acid 1.3, 1.0, WBC 16.5, hemoglobin 10.1,  Respiratory panel including influenza A/B, SARS-CoV-2 all negative Chest x-ray-SCD on the right consistent with possible pneumonia   Was asked to admit acute respiratory failure likely pneumonia --POA has agreed for empiric antibiotics, supportive therapy, remains DNR DNI   * Sepsis ([place]) -- Resolved sepsis physiology. Hemodynamically stable.  Satting 94% on 3 L of oxygen,  - POA; met sepsis criteria with acute on chronic respiratory failure -WBC of 16.5 >>>> 10.2 -Lactic acid 1.3, 1.0 -Procalcitonin 5.73 -BNP 284.0 -Chest x-ray - 1. Interval increase in/new patchy airspace opacity in the right lung base, suspicious for infection. 2. Unchanged appearance of the left lung base with a possible layering left-sided pleural effusion. 3. Previously seen nodular opacity in the left upper lung is not visualized on this exam,   Therefore likely source of infection pneumonia versus aspiration pneumonia -Patient darted on broad-spectrum antibiotics with cefepime and azithromycin, switched to IV Zosyn due to possibly aspiration pneumonia -Follow-up with blood and urine culture  -Continue supportive therapy with acute respiratory failure treating underlying causes POA son confirmed DNR/DNI status    Acute respiratory failure with hypoxia (HCC) -Baseline O2 demand 1.5 L -Subsequently required up to 15 L of oxygen, via Ventimask,>>> much improved now on 5>>>3  L of oxygen, satting 94%  -Likely source underlying pneumonia versus aspiration pneumonia -On empiric antibiotics--continue IV antibiotics -POA son has been contacted confirmed DNR/DNI status, -Continue supportive care -  DuoNeb bronchodilators, IV ABX>> switch to p.o. Levaquin  Hypoxia Acute on chronic likely due to pneumonia -Down to 3 L of oxygen, satting 94%   Bacteremia -Ruled out on  this admission, cultures remain negative  -History of bacteremia approximately year ago will follow the blood cultures -Will treat accordingly  GERD (gastroesophageal reflux disease) Continue PPI  Pulmonary nodule - Chronic follow-up with outpatient -Per patient's son POA patient has been on the hospice  Leukocytosis Likely due to pneumonia, continue antibiotics  Multifocal pneumonia Treated with broad spectrum antibiotics of Zosyn (patient with a history of bacteremia) Cultures remain negative, IV Zosyn switched to p.o. Levaquin  Chronic kidney disease, stage 2 (mild) BUN/creatinine at baseline -Monitoring, avoiding nephrotoxins Lab Results  Component Value Date   CREATININE 1.13 (H) 09/10/2022   CREATININE 1.01 (H) 09/09/2022   CREATININE 0.76 11/06/2021     Anemia History of anemia of chronic disease, and iron deficiency anemia,  Continue supplement iron,  stable  Diastolic heart failure (HCC) - Seems to be volume overloaded -possible crackles, lower extremity edema -Discontinued IV Lasix, will continue with the p.o. Lasix  Dementia without behavioral disturbance (HCC) - Stable with no behavior exacerbation, continue home medication of Aricept  Hyperlipidemia with target LDL less than 130 At her age, and chronic morbidities, risks outweighs the benefit of statins  Essential hypertension - Holding home medication include losartan -BP remained stable  Dehydration-resolved as of 09/10/2022 - Resuming gentle IV fluid hydration        Consultants: Palliative care/ Procedures performed: none  Disposition: Home with hospice Diet recommendation:  Discharge Diet Orders (From admission, onward)     Start     Ordered   09/13/22 0000  Diet - low sodium heart healthy        09/13/22 0956           Regular diet DISCHARGE MEDICATION: Allergies as of 09/13/2022   No Known Allergies      Medication List     TAKE these medications    albuterol (2.5  MG/3ML) 0.083% nebulizer solution Commonly known as: PROVENTIL NEBULIZE 1 VIAL EVERY 4 HOURS AS NEEDED SHORTNESS OF BREATH What changed: See the new instructions.   ascorbic acid 500 MG tablet Commonly known as: VITAMIN C Take 1 tablet (500 mg total) by mouth daily.   aspirin 81 MG tablet Take 81 mg by mouth daily.   azelastine 0.1 % nasal spray Commonly known as: Astelin 2 sprays in each nostril at bedtime.   azithromycin 250 MG tablet Commonly known as: ZITHROMAX Take by mouth.   Cyanocobalamin 1000 MCG Caps Take 1,000 mcg by mouth daily.   dextromethorphan-guaiFENesin 30-600 MG 12hr tablet Commonly known as: MUCINEX DM Take 1 tablet by mouth 2 (two) times daily.   donepezil 5 MG tablet Commonly known as: ARICEPT Take 1 tablet (5 mg total) by mouth at bedtime.   feeding supplement Liqd Take 237 mLs by mouth 2 (two) times daily between meals. What changed:  when to take this reasons to take this   furosemide 40 MG tablet Commonly known as: LASIX Take 1 tablet (40 mg total) by mouth daily. (NEEDS TO BE SEEN BEFORE NEXT REFILL) What changed: how much to take   GAS RELIEF PO Take 125 mg by mouth at bedtime.   IRON PO Take 65 mg by mouth daily.   levofloxacin 250 MG tablet Commonly known as: LEVAQUIN Take 1 tablet (250 mg total) by mouth daily for 3 days. Start taking on:  September 14, 2022   losartan 100 MG tablet Commonly known as: COZAAR TAKE ONE TABLET AT BEDTIME   methylPREDNISolone 4 MG Tbpk tablet Commonly known as: MEDROL DOSEPAK Medrol Dosepak take as instructed   MULTIPLE VITAMIN PO Take 1 tablet by mouth daily.   nystatin cream Commonly known as: MYCOSTATIN Apply 1 application topically 2 (two) times daily.   OXYGEN Inhale 1.5 L into the lungs continuous. Per patient's son liters 1 1/2 now, at home 02 liters 1 1/4   PROBIOTIC PO Take 1 tablet by mouth daily.   sodium chloride 0.65 % Soln nasal spray Commonly known as: OCEAN Place 1 spray  into both nostrils daily as needed for congestion.   Trelegy Ellipta 100-62.5-25 MCG/ACT Aepb Generic drug: Fluticasone-Umeclidin-Vilant INHALE 1 PUFF DAILY AS DIRECTED What changed: See the new instructions.               Discharge Care Instructions  (From admission, onward)           Start     Ordered   09/13/22 0000  Discharge wound care:       Comments: Continue wound care per RN instructions   09/13/22 0956            Discharge Exam: Filed Weights   09/11/22 0500 09/12/22 0500 09/13/22 0500  Weight: 80.2 kg 72 kg 77.1 kg   General:  AAO x 1,  cooperative, no distress;   HEENT:  Normocephalic, PERRL, otherwise with in Normal limits   Neuro:  CNII-XII intact. , normal motor and sensation, reflexes intact   Lungs:   Clear to auscultation BL, Respirations unlabored,  No wheezes / crackles  Cardio:    S1/S2, RRR, No murmure, No Rubs or Gallops   Abdomen:  Soft, non-tender, bowel sounds active all four quadrants, no guarding or peritoneal signs.  Muscular  skeletal:  Limited exam -sever global generalized weaknesses -Bedbound - in bed, able to move all 4 extremities,   2+ pulses,  symmetric, No pitting edema  Skin:  Dry, warm to touch, negative for any Rashes,  Wounds: Please see nursing documentation  Pressure Injury 10/05/21 Buttocks Right;Left Stage 2 -  Partial thickness loss of dermis presenting as a shallow open injury with a red, pink wound bed without slough. total area blanchable 5 cm X 4 cm, 2- 0.5cmx0.5cm stage 2 on each buttock (Active)  10/05/21 0203  Location: Buttocks  Location Orientation: Right;Left  Staging: Stage 2 -  Partial thickness loss of dermis presenting as a shallow open injury with a red, pink wound bed without slough.  Wound Description (Comments): total area blanchable 5 cm X 4 cm, 2- 0.5cmx0.5cm stage 2 on each buttock  Present on Admission:      Pressure Injury 09/09/22 Buttocks Left Stage 3 -  Full thickness tissue loss.  Subcutaneous fat may be visible but bone, tendon or muscle are NOT exposed. (Active)  09/09/22 1800  Location: Buttocks  Location Orientation: Left  Staging: Stage 3 -  Full thickness tissue loss. Subcutaneous fat may be visible but bone, tendon or muscle are NOT exposed.  Wound Description (Comments):   Present on Admission: Yes  Dressing Type Foam - Lift dressing to assess site every shift 09/13/22 0900          Condition at discharge: fair  The results of significant diagnostics from this hospitalization (including imaging, microbiology, ancillary and laboratory) are listed below for reference.   Imaging Studies: DG Chest Portable 1 View  Result  Date: 09/09/2022 CLINICAL DATA:  Shortness of breath EXAM: PORTABLE CHEST 1 VIEW COMPARISON:  CXR 10/04/21, CT CChest 10/06/21 FINDINGS: Unchanged appearance of the left lung base with a possible layering left-sided pleural effusion. Compared to prior exam there is interval increase in/new patchy airspace opacities in the right lung base, which is suspicious for infection. No pneumothorax. Unchanged cardiac and mediastinal contours. Visualized upper abdomen is unremarkable. No radiographically apparent displaced rib fractures. Degenerative changes of the right AC joint. Previously seen nodular opacity in the left upper lung is not visualized on this exam. IMPRESSION: 1. Interval increase in/new patchy airspace opacity in the right lung base, suspicious for infection. 2. Unchanged appearance of the left lung base with a possible layering left-sided pleural effusion. 3. Previously seen nodular opacity in the left upper lung is not visualized on this exam, likely due to xray technique. Electronically Signed   By: Marin Roberts M.D.   On: 09/09/2022 09:57    Microbiology: Results for orders placed or performed during the hospital encounter of 09/09/22  Resp panel by RT-PCR (RSV, Flu A&B, Covid) Anterior Nasal Swab     Status: None   Collection Time:  09/09/22 10:08 AM   Specimen: Anterior Nasal Swab  Result Value Ref Range Status   SARS Coronavirus 2 by RT PCR NEGATIVE NEGATIVE Final    Comment: (NOTE) SARS-CoV-2 target nucleic acids are NOT DETECTED.  The SARS-CoV-2 RNA is generally detectable in upper respiratory specimens during the acute phase of infection. The lowest concentration of SARS-CoV-2 viral copies this assay can detect is 138 copies/mL. A negative result does not preclude SARS-Cov-2 infection and should not be used as the sole basis for treatment or other patient management decisions. A negative result may occur with  improper specimen collection/handling, submission of specimen other than nasopharyngeal swab, presence of viral mutation(s) within the areas targeted by this assay, and inadequate number of viral copies(<138 copies/mL). A negative result must be combined with clinical observations, patient history, and epidemiological information. The expected result is Negative.  Fact Sheet for Patients:  EntrepreneurPulse.com.au  Fact Sheet for Healthcare Providers:  IncredibleEmployment.be  This test is no t yet approved or cleared by the [place] FDA and  has been authorized for detection and/or diagnosis of SARS-CoV-2 by FDA under an Emergency Use Authorization (EUA). This EUA will remain  in effect (meaning this test can be used) for the duration of the COVID-19 declaration under Section 564(b)(1) of the Act, 21 U.S.C.section 360bbb-3(b)(1), unless the authorization is terminated  or revoked sooner.       Influenza A by PCR NEGATIVE NEGATIVE Final   Influenza B by PCR NEGATIVE NEGATIVE Final    Comment: (NOTE) The Xpert Xpress SARS-CoV-2/FLU/RSV plus assay is intended as an aid in the diagnosis of influenza from Nasopharyngeal swab specimens and should not be used as a sole basis for treatment. Nasal washings and aspirates are unacceptable for Xpert Xpress  SARS-CoV-2/FLU/RSV testing.  Fact Sheet for Patients: EntrepreneurPulse.com.au  Fact Sheet for Healthcare Providers: IncredibleEmployment.be  This test is not yet approved or cleared by the [place] FDA and has been authorized for detection and/or diagnosis of SARS-CoV-2 by FDA under an Emergency Use Authorization (EUA). This EUA will remain in effect (meaning this test can be used) for the duration of the COVID-19 declaration under Section 564(b)(1) of the Act, 21 U.S.C. section 360bbb-3(b)(1), unless the authorization is terminated or revoked.     Resp Syncytial Virus by PCR NEGATIVE NEGATIVE Final  Comment: (NOTE) Fact Sheet for Patients: EntrepreneurPulse.com.au  Fact Sheet for Healthcare Providers: IncredibleEmployment.be  This test is not yet approved or cleared by the [place] FDA and has been authorized for detection and/or diagnosis of SARS-CoV-2 by FDA under an Emergency Use Authorization (EUA). This EUA will remain in effect (meaning this test can be used) for the duration of the COVID-19 declaration under Section 564(b)(1) of the Act, 21 U.S.C. section 360bbb-3(b)(1), unless the authorization is terminated or revoked.  Performed at [facility], [address] [place], [place] [address]   Culture, blood (routine x 2) Call MD if unable to obtain prior to antibiotics being given     Status: None (Preliminary result)   Collection Time: 09/09/22  3:40 PM   Specimen: BLOOD  Result Value Ref Range Status   Specimen Description BLOOD BLOOD LEFT ARM  Final   Special Requests   Final    BOTTLES DRAWN AEROBIC AND ANAEROBIC Blood Culture adequate volume   Culture   Final    NO GROWTH 4 DAYS Performed at [facility], [address] [place], [place] [address]    Report Status PENDING  Incomplete  Culture, blood (routine x 2) Call MD if unable to obtain prior to antibiotics being given      Status: None (Preliminary result)   Collection Time: 09/09/22  3:40 PM   Specimen: BLOOD  Result Value Ref Range Status   Specimen Description BLOOD BLOOD LEFT ARM  Final   Special Requests   Final    BOTTLES DRAWN AEROBIC AND ANAEROBIC Blood Culture results may not be optimal due to an excessive volume of blood received in culture bottles   Culture   Final    NO GROWTH 4 DAYS Performed at [facility], [address] [place], [place] [address]    Report Status PENDING  Incomplete  MRSA Next Gen by PCR, Nasal     Status: None   Collection Time: 09/10/22  4:40 PM   Specimen: Nasal Mucosa; Nasal Swab  Result Value Ref Range Status   MRSA by PCR Next Gen NOT DETECTED NOT DETECTED Final    Comment: (NOTE) The GeneXpert MRSA Assay (FDA approved for NASAL specimens only), is one component of a comprehensive MRSA colonization surveillance program. It is not intended to diagnose MRSA infection nor to guide or monitor treatment for MRSA infections. Test performance is not FDA approved in patients less than 6 years old. Performed at [facility], [address] [place], [place] [address]     Labs: CBC: Recent Labs  Lab 09/09/22 1008 09/10/22 0505 09/11/22 0402 09/12/22 0403 09/13/22 0430  WBC 16.5* 10.2 12.0* 13.9* 13.3*  NEUTROABS 13.9*  --   --   --   --   HGB 10.1* 9.4* 9.8* 9.8* 10.2*  HCT 31.1* 29.8* 29.9* 30.2* 31.2*  MCV 98.4 100.7* 98.7 98.4 98.4  PLT 392 394 467* 474* 123456*   Basic Metabolic Panel: Recent Labs  Lab 09/09/22 1145 09/09/22 1540 09/10/22 0505 09/11/22 0402 09/12/22 0403 09/13/22 0430  NA 130*  --  132* 135 139 139  K 3.1*  --  4.3 3.6 3.4* 3.3*  CL 88*  --  90* 88* 89* 88*  CO2 32  --  35* 36* 39* 40*  GLUCOSE 136*  --  141* 136* 141* 132*  BUN 17  --  23 36* 43* 45*  CREATININE 1.01*  --  1.13* 1.34* 1.25* 1.15*  CALCIUM 7.4*  --  7.3* 7.4* 7.7*  7.7*  MG  --  2.3  --   --   --   --   PHOS  --  3.2  --   --   --   --    Liver Function  Tests: Recent Labs  Lab 09/09/22 1145  AST 20  ALT 12  ALKPHOS 75  BILITOT 0.3  PROT 7.1  ALBUMIN 2.2*   CBG: Recent Labs  Lab 09/10/22 0817 09/11/22 0837 09/12/22 0715 09/13/22 0931  GLUCAP 135* 133* 172* 260*    Discharge time spent: greater than 50 minutes.  Signed: Deatra James, MD [facility] 09/13/2022

## 2022-09-14 LAB — CULTURE, BLOOD (ROUTINE X 2)
Culture: NO GROWTH
Culture: NO GROWTH
Special Requests: ADEQUATE

## 2022-09-21 ENCOUNTER — Other Ambulatory Visit: Payer: Self-pay | Admitting: Family Medicine

## 2022-09-21 DIAGNOSIS — F0390 Unspecified dementia without behavioral disturbance: Secondary | ICD-10-CM

## 2022-09-21 MED ORDER — FUROSEMIDE 40 MG PO TABS: 40.0000 mg | ORAL_TABLET | Freq: Every day | ORAL | 0 refills | Status: DC

## 2022-09-21 MED ORDER — DONEPEZIL HCL 5 MG PO TABS: 5.0000 mg | ORAL_TABLET | Freq: Every day | ORAL | 0 refills | Status: DC

## 2022-09-21 NOTE — Telephone Encounter (Signed)
  Prescription Request  09/21/2022  Is this a "Controlled Substance" medicine? no  Have you seen your PCP in the last 2 weeks? Pt had telephone call 02/14  If YES, route message to pool  -  If NO, patient needs to be scheduled for appointment.  What is the name of the medication or equipment? Son called in and said his mother needs  furosimide 40 mg and donepezil called into [facility]. He said she is bed ridden and can't come in or do telephone.Pt was recently at [facility] with pnuemonia  Have you contacted your pharmacy to request a refill? yes   Which pharmacy would you like this sent to? [facility]   Patient notified that their request is being sent to the clinical staff for review and that they should receive a response within 2 business days.

## 2022-10-22 ENCOUNTER — Other Ambulatory Visit: Payer: Self-pay | Admitting: Family Medicine

## 2022-10-30 ENCOUNTER — Other Ambulatory Visit: Payer: Self-pay | Admitting: Family Medicine

## 2022-11-18 ENCOUNTER — Other Ambulatory Visit: Payer: Self-pay | Admitting: Family Medicine

## 2022-11-18 MED ORDER — FUROSEMIDE 40 MG PO TABS: 40.0000 mg | ORAL_TABLET | Freq: Every day | ORAL | 0 refills | Status: DC

## 2022-11-18 NOTE — Telephone Encounter (Signed)
Son scheduled apt for next available for June. Son wants a to know if a refill can be called in till apt. He insisted that he get a call when rx sent to pharmacy.

## 2022-11-18 NOTE — Addendum Note (Signed)
Addended by: Julious Payer D on: 11/18/2022 05:05 PM   Modules accepted: Orders

## 2022-11-18 NOTE — Telephone Encounter (Signed)
Detinger pt NTBS 30-d given 10/22/22

## 2022-11-19 ENCOUNTER — Telehealth: Payer: Medicare Other | Admitting: Family Medicine

## 2022-11-19 DIAGNOSIS — X32XXXA Exposure to sunlight, initial encounter: Secondary | ICD-10-CM | POA: Diagnosis not present

## 2022-11-19 DIAGNOSIS — C44329 Squamous cell carcinoma of skin of other parts of face: Secondary | ICD-10-CM | POA: Diagnosis not present

## 2022-11-19 DIAGNOSIS — L570 Actinic keratosis: Secondary | ICD-10-CM | POA: Diagnosis not present

## 2022-11-24 ENCOUNTER — Other Ambulatory Visit: Payer: Self-pay | Admitting: Nurse Practitioner

## 2022-11-24 DIAGNOSIS — J310 Chronic rhinitis: Secondary | ICD-10-CM

## 2022-12-17 ENCOUNTER — Telehealth: Payer: Medicare Other | Admitting: Family Medicine

## 2022-12-17 DIAGNOSIS — C44329 Squamous cell carcinoma of skin of other parts of face: Secondary | ICD-10-CM | POA: Diagnosis not present

## 2023-01-05 DIAGNOSIS — D049 Carcinoma in situ of skin, unspecified: Secondary | ICD-10-CM | POA: Diagnosis not present

## 2023-01-05 DIAGNOSIS — C44300 Unspecified malignant neoplasm of skin of unspecified part of face: Secondary | ICD-10-CM | POA: Diagnosis not present

## 2023-01-06 ENCOUNTER — Telehealth: Payer: Self-pay | Admitting: Radiation Oncology

## 2023-01-06 NOTE — Telephone Encounter (Signed)
6/26 @ 12:36 pm Called patient's son mailbox full could not leave voicemail.  Left voicemail on patient's 2nd contact number for patient's son to call our office, need copy of path report before schedule.

## 2023-01-06 NOTE — Telephone Encounter (Signed)
6/26 @ 2:49 pm spoke to patient's son Reita Cliche, patient had recent pathology done at [facility] [phone], at requested of Dr. Scharlene Gloss office.  Spoke to [place] at [facility], will fax over path report.  Waiting on report before schedule.

## 2023-01-06 NOTE — Telephone Encounter (Signed)
6/26 @ 9:42 am sent via stat fax requested for recent path report and/or  images to be push to [employer]hare. Waiting on reports and/or images.

## 2023-01-11 NOTE — Progress Notes (Signed)
Histology and Location of Primary Skin Cancer: Skin cancer of face; Left Cheek     Carrie Crawford presented with the following signs/symptoms: facial swelling/ lesion on face  Past/Anticipated interventions by patient's surgeon/dermatologist for current problematic lesion, if any:  none in EMR. See ENT note below.   Past skin cancers, if any: has had other skin cancer in the past, family also reports that she has three other skin cancers on her face currently.   History of Blistering sunburns, if any: unknown by family   SAFETY ISSUES: Prior radiation? no Pacemaker/ICD? no Possible current pregnancy? no Is the patient on methotrexate? no  Current Complaints / other details:  Would like to know side effects and treatments. Wants to know the plan overall. Family is concerned for pt coming in wheelchair (pt has been bedridden for 4 years.RN encouraged family we have lift equipment to aid in this. Family also stated her tumor is fast growing with bloody and yellow drainage. Pt wears 1.5L 02 .   Carrie Frizzle, MD - 01/05/2023  Return visit. She is here with her son. She has a left cheek skin cancer that was biopsied a month ago. 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 It has been growing quickly since then.  She is in a wheelchair on oxygen. She has significant COPD and is completely immobile. She does not hear very well and does not like to wear hearing aids.  On exam there is a 3 cm exophytic fungating mass involving the left cheek area. It is overlying the parotid gland but not obviously invading the gland. There is no other palpable adenopathy.  I discussed with the son that the typical treatment for this is wide excision with parotidectomy and facial nerve dissection and then possible skin grafting. Given her overall poor health I think that would be very difficult. I am going to recommend referral to radiation oncology to see if she can get hypofractionated treatment. Son is in  agreement.

## 2023-01-13 ENCOUNTER — Telehealth: Payer: Self-pay

## 2023-01-13 ENCOUNTER — Encounter: Payer: Self-pay | Admitting: Radiation Oncology

## 2023-01-13 ENCOUNTER — Telehealth (INDEPENDENT_AMBULATORY_CARE_PROVIDER_SITE_OTHER): Payer: Medicare Other | Admitting: Family Medicine

## 2023-01-13 DIAGNOSIS — F0390 Unspecified dementia without behavioral disturbance: Secondary | ICD-10-CM | POA: Diagnosis not present

## 2023-01-13 DIAGNOSIS — I110 Hypertensive heart disease with heart failure: Secondary | ICD-10-CM

## 2023-01-13 DIAGNOSIS — D0439 Carcinoma in situ of skin of other parts of face: Secondary | ICD-10-CM | POA: Diagnosis not present

## 2023-01-13 DIAGNOSIS — I10 Essential (primary) hypertension: Secondary | ICD-10-CM

## 2023-01-13 DIAGNOSIS — E876 Hypokalemia: Secondary | ICD-10-CM | POA: Diagnosis not present

## 2023-01-13 DIAGNOSIS — I5032 Chronic diastolic (congestive) heart failure: Secondary | ICD-10-CM

## 2023-01-13 NOTE — Progress Notes (Signed)
Radiation Oncology         [phone] ________________________________  Initial Outpatient Consultation  Name: Carrie Crawford MRN: [id number]  Date: 01/15/2023  DOB: October 15, 1925  WU:[id number], Birder Robson, FNP  Serena Colonel, MD   REFERRING PHYSICIAN: Serena Colonel, MD  DIAGNOSIS:    ICD-10-CM   1. Squamous cell carcinoma of skin of left cheek  C44.329     2. 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 female who presented to her PCP on 08/26/22 with 4 large lesions on her face which had been present for months. She also has a prior history of skin cancer, specifically on her face. (She was previously seen by dermatology at her nursing home in [place]).   She was subsequently referred to dermatology and underwent a biopsy of one of the left cheek lesions on 11/19/22. Pathology showed findings consistent with moderately differentiated squamous cell carcinoma.   The patient was already established with [facility] for management hearing issues (underwent bilateral myringotomy and tympanostomy tube placement on 03/28/2022). Given that she was already established [facility], the patient followed up with Dr. Pollyann Kennedy on 01/05/23 for further management of her recent diagnosis of skin cancer. Exam performed at that time showed a 3 cm exophytic fungating mass involving the left cheek area. The mass was noted to overlay but not invade the parotid gland. No palpable adenopathy was noted.   Dr. Pollyann Kennedy discussed that typical treatment for this cheek  mass would consist of wide excision with parotidectomy, facial nerve dissection, and possible skin grafting. However, given the patient's overall poor health, Dr. Pollyann Kennedy does not recommend surgery and rather recommends hypofractionated radiation therapy to the lesion which we will discuss in detail today.   Of note: The patient is completely immobile and requires a wheel chair. She also has significant COPD and is on supplemental O2, and has a history of pulmonary nodules (see CT below). She does not hear very well and does not like to wear her hearing aid.   She has a chest CT scan performed on 10/06/21 which showed a spiculated 7 mm LUL nodule with features worrisome for primary bronchogenic carcinoma. CT also showed ground-glass and collapse/consolidation in the posterior segment  right upper lobe and right lower lobe, indicative of aspiration or pneumonia, a 6 mm indeterminate RUL nodule, and small to borderline enlarged but indeterminate mediastinal lymph nodes.   She was also hospitalized earlier this year from 09/09/22 from 09/13/22 with sepsis, multifocal pneumonia, and acute respiratory failure with hypoxia likely secondary to pneumonia. She improved following hospital course, but now has increased O2 requirements.     PREVIOUS RADIATION THERAPY: No  PAST MEDICAL HISTORY:  has a past medical history of Arthritis, CHF (congestive heart failure) (HCC), Chronic bronchitis (HCC), Chronic kidney disease, Compression fracture of lumbar vertebra (HCC), COPD (chronic obstructive pulmonary disease) (HCC), Dementia (HCC), Dyslipidemia, Hypertension, Lung nodule (10/06/2021), Multiple allergies, Osteopenia, Pneumonia, and Rhinitis, chronic.    PAST SURGICAL HISTORY: Past Surgical History:  Procedure Laterality  Date   CATARACT EXTRACTION W/ INTRAOCULAR LENS  IMPLANT, BILATERAL Bilateral    CHOLECYSTECTOMY     FRACTURE SURGERY     MYRINGOTOMY WITH TUBE PLACEMENT Bilateral 03/30/2022   Procedure:  MYRINGOTOMY WITH TUBE PLACEMENT;  Surgeon: Laren Boom, DO;  Location: [facility] OR;  Service: ENT;  Laterality: Bilateral;   OPEN REDUCTION INTERNAL FIXATION (ORIF) DISTAL RADIAL FRACTURE Right 03/16/2018   ORIF FEMUR FRACTURE Right 03/16/2018   Procedure: OPEN REDUCTION INTERNAL FIXATION (ORIF) DISTAL FEMUR FRACTURE;  Surgeon: Roby Lofts, MD;  Location: [facility] OR;  Service: Orthopedics;  Laterality: Right;   THYROIDECTOMY, PARTIAL  2004   [facility]    FAMILY HISTORY: family history includes Cancer in her daughter; Heart disease in her brother.  SOCIAL HISTORY:  reports that she has never smoked. She has never used smokeless tobacco. She reports that she does not drink alcohol and does not use drugs.  ALLERGIES: Patient has no known allergies.  MEDICATIONS:  Current Outpatient Medications  Medication Sig Dispense Refill   albuterol (PROVENTIL) (2.5 MG/3ML) 0.083% nebulizer solution NEBULIZE 1 VIAL EVERY 4 HOURS AS NEEDED SHORTNESS OF BREATH (Patient taking differently: Take 2.5 mg by nebulization every 4 (four) hours as needed for wheezing or shortness of breath.) 90 mL 5   ascorbic acid (VITAMIN C) 500 MG tablet Take 1 tablet (500 mg total) by mouth daily. 30 tablet 1   aspirin 81 MG tablet Take 81 mg by mouth daily.     azelastine (ASTELIN) 0.1 % nasal spray 2 SPRAYS IN EACH NOSTRIL AT BEDTIME 90 mL 3   Cyanocobalamin 1000 MCG CAPS Take 1,000 mcg by mouth daily.     dextromethorphan-guaiFENesin (MUCINEX DM) 30-600 MG 12hr tablet Take 1 tablet by mouth 2 (two) times daily. 20 tablet 0   donepezil (ARICEPT) 5 MG tablet Take 1 tablet (5 mg total) by mouth at bedtime. 90 tablet 0   feeding supplement (ENSURE ENLIVE / ENSURE PLUS) LIQD Take 237 mLs by mouth 2 (two) times daily between meals. (Patient taking differently: Take 237 mLs by mouth daily as needed (Snack).) 237 mL 12   Ferrous Sulfate (IRON PO) Take 65 mg by mouth daily.     Fluticasone-Umeclidin-Vilant (TRELEGY ELLIPTA) 100-62.5-25  MCG/ACT AEPB Inhale 1 puff into the lungs daily. (NEEDS TO BE SEEN BEFORE NEXT REFILL) 60 each 0   furosemide (LASIX) 40 MG tablet Take 1 tablet (40 mg total) by mouth daily. (Patient taking differently: Take 20 mg by mouth daily.) 30 tablet 0   losartan (COZAAR) 100 MG tablet TAKE ONE TABLET AT BEDTIME (Patient taking differently: Take 25 mg by mouth at bedtime.) 30 tablet 5   MULTIPLE VITAMIN PO Take 1 tablet by mouth daily.     OXYGEN Inhale 1.5 L into the lungs continuous. Per patient's son liters 1 1/2 now, at home 02 liters 1 1/4     Probiotic Product (PROBIOTIC PO) Take 1 tablet by mouth daily.     sodium chloride (OCEAN) 0.65 % SOLN nasal spray Place 1 spray into both nostrils daily as needed for congestion.     Simethicone (GAS RELIEF PO) Take 125 mg by mouth at bedtime. (Patient not taking: Reported on 01/13/2023)     No current facility-administered medications for this encounter.    REVIEW OF SYSTEMS:  Notable for that above.   PHYSICAL EXAM:  height is 5\' 4"  (1.626 m). Her temperature is 97.9 F (36.6 C). Her blood pressure is 124/66 and her pulse is 73. Her  respiration is 24 (abnormal) and oxygen saturation is 98%.   General: Alert with signs of confusion, in no acute distress - in a WC HEENT:   Extraocular movements are intact.  Raised lesion on left cheek, exudative, with surrounding erythema.  She has crusted lesions bilaterally on her face consistent with actinic keratoses.  She is hard of hearing Neck: no obvious palpable cervical or supraclavicular lymphadenopathy to palpation. Extremities: Lower extremity edema Lymphatics: see Neck Exam Skin: See HEENT Musculoskeletal: She is wheelchair-bound  Psychiatric: She is pleasant to speak with, conversant Neuro: She asks many questions repeatedly with signs of cognitive decline   ECOG = 4  0 - Asymptomatic (Fully active, able to carry on all predisease activities without restriction)  1 - Symptomatic but completely  ambulatory (Restricted in physically strenuous activity but ambulatory and able to carry out work of a light or sedentary nature. For example, light housework, office work)  2 - Symptomatic, <50% in bed during the day (Ambulatory and capable of all self care but unable to carry out any work activities. Up and about more than 50% of waking hours)  3 - Symptomatic, >50% in bed, but not bedbound (Capable of only limited self-care, confined to bed or chair 50% or more of waking hours)  4 - Bedbound (Completely disabled. Cannot carry on any self-care. Totally confined to bed or chair)  5 - Death   Santiago Glad MM, Creech RH, Tormey DC, et al. [id number]). "Toxicity and response criteria of the [facility]". Am. Evlyn Clines. Oncol. 5 (6): 649-55   LABORATORY DATA:  Lab Results  Component Value Date   WBC 13.3 (H) 09/13/2022   HGB 10.2 (L) 09/13/2022   HCT 31.2 (L) 09/13/2022   MCV 98.4 09/13/2022   PLT 479 (H) 09/13/2022   CMP     Component Value Date/Time   NA 139 09/13/2022 0430   NA 133 (L) 11/06/2021 1206   K 3.3 (L) 09/13/2022 0430   CL 88 (L) 09/13/2022 0430   CO2 40 (H) 09/13/2022 0430   GLUCOSE 132 (H) 09/13/2022 0430   BUN 45 (H) 09/13/2022 0430   BUN 14 11/06/2021 1206   CREATININE 1.15 (H) 09/13/2022 0430   CALCIUM 7.7 (L) 09/13/2022 0430   PROT 7.1 09/09/2022 1145   PROT 7.2 08/01/2021 1258   ALBUMIN 2.2 (L) 09/09/2022 1145   ALBUMIN 3.3 (L) 08/01/2021 1258   AST 20 09/09/2022 1145   ALT 12 09/09/2022 1145   ALKPHOS 75 09/09/2022 1145   BILITOT 0.3 09/09/2022 1145   BILITOT 0.2 08/01/2021 1258   GFR 76.15 04/29/2017 1439   EGFR 73 11/06/2021 1206   GFRNONAA 44 (L) 09/13/2022 0430         RADIOGRAPHY: No results found.    IMPRESSION/PLAN:Today, I talked to the patient and her very supportive son about the findings and work-up thus far.  We discussed the patient's diagnosis of advanced skin cancer of the left cheek and general treatment for this,  highlighting the role of radiotherapy in the management.  We discussed the available radiation techniques, and focused on the details of logistics and delivery.     We discussed the risks, benefits, and side effects of a hypofractionated course of radiotherapy.  I recommend she receive 5 fractions every other day to the left cheek.  I do not think a longer course of radiation is realistic given her comorbidities.  Side effects may include but not necessarily be limited to: Skin irritation, fatigue, rare  soft tissue injury.  No guarantees of treatment were given.  I am optimistic that the treatment will be palliative and could even be curative if her skin cancer is highly radiosensitive.  Intent of treatment is curative.  A consent form was signed and placed in the patient's medical record. The patient and her son were encouraged to ask questions that I answered to the best of my ability.    They are enthusiastic about proceeding.  We will conduct CT simulation today and start her treatment in 1-1/2 weeks.   On date of service, in total, I spent 45 minutes on this encounter. Patient was seen in person.   __________________________________________   Lonie Peak, MD  This document serves as a record of services personally performed by Lonie Peak, MD. It was created on her behalf by Neena Rhymes, a trained medical scribe. The creation of this record is based on the scribe's personal observations and the provider's statements to them. This document has been checked and approved by the attending provider.

## 2023-01-13 NOTE — Progress Notes (Signed)
Virtual Visit via video Note   Due to COVID-19 pandemic this visit was conducted virtually. This visit type was conducted due to national recommendations for restrictions regarding the COVID-19 Pandemic (e.g. social distancing, sheltering in place) in an effort to limit this patient's exposure and mitigate transmission in our community. All issues noted in this document were discussed and addressed.  A physical exam was not performed with this format.  I connected with  Carrie Crawford  on 01/13/23 at 1601 by video and verified that I am speaking with the correct person using two identifiers. Carrie Crawford is currently located at home and her son is currently with her during the visit. The provider, Carrie Earing, FNP is located in their office at time of visit.  I discussed the limitations, risks, security and privacy concerns of performing an evaluation and management service by video  and the availability of in person appointments. I also discussed with the patient that there may be a patient responsible charge related to this service. The patient expressed understanding and agreed to proceed.   History and Present Illness:  History provided by her son due to dementia. Carrie Crawford is established with [facility] who has been managing her medications and providing in home care. Her losartan has been decreased to 25 mg daily due to hypotension. The hospice nurse came out today and her BP was 112/55. Her lasix as also been decreased to 20 mg daily. Denies edema. She has been dx with skin cancer of her face. It is a larger tumor. They have had a consult regarding radiation treatments although these have not been scheduled yet. Hospice with discontinue services when she starts radiation however. Carrie Crawford now has equipment to be able to transport Carrie Crawford to visit however and will schedule an appt to bring Carrie Crawford in for evaluation and labs.    ROS As per HPI.      Observations/Objective: Sleeping, easily aroused. Respirations unlabored. No cyanosis. Non toxic appearing. Normal behavior. Impaired cognitive and memory- baseline   Assessment and Plan: Diagnoses and all orders for this visit:  Primary hypertension Well controlled on current regimen.   Dementia without behavioral disturbance (HCC) Stable. Continue aricept.   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   Hypokalemia Low potassium after hospital admission a few months ago. Discussed need for repeat. Carrie Crawford will schedule an appt for this.   Chronic diastolic heart failure (HCC) On lasix and ARB.     Follow Up Instructions: Schedule follow up in person and labs.     I discussed the assessment and treatment plan with the patient. The patient was provided an opportunity to ask questions and all were answered. The patient agreed with the plan and demonstrated an understanding of the instructions.   The patient was advised to call back or seek an in-person evaluation if the symptoms worsen or if the condition fails to improve as anticipated.  The above assessment and management plan was discussed with the patient. The patient verbalized understanding of and has agreed to the management plan. Patient is aware to call the clinic if symptoms persist or worsen. Patient is aware when to return to the clinic for a follow-up visit. Patient educated on when it is appropriate to go to the emergency department.   Time call ended: 1607  I provided 6 minutes of face-to-face time during this encounter.    Carrie Earing, FNP

## 2023-01-13 NOTE — Telephone Encounter (Signed)
Rn called pt family for meaningful use and nurse evaluation information. Note completed and routed to Dr. Basilio Cairo.

## 2023-01-15 ENCOUNTER — Other Ambulatory Visit: Payer: Self-pay

## 2023-01-15 ENCOUNTER — Ambulatory Visit
Admission: RE | Admit: 2023-01-15 | Discharge: 2023-01-15 | Disposition: A | Discharge status: Home / Self Care | Source: Ambulatory Visit | Attending: Radiation Oncology | Admitting: Radiation Oncology

## 2023-01-15 ENCOUNTER — Ambulatory Visit
Admission: RE | Admit: 2023-01-15 | Discharge: 2023-01-15 | Disposition: A | Discharge status: Home / Self Care | Payer: Medicare Other | Source: Ambulatory Visit | Attending: Radiation Oncology | Admitting: Radiation Oncology

## 2023-01-15 ENCOUNTER — Encounter: Payer: Self-pay | Admitting: Radiation Oncology

## 2023-01-15 VITALS — BP 124/66 | HR 73 | Temp 97.9°F | Resp 24 | Ht 64.0 in

## 2023-01-15 DIAGNOSIS — C44329 Squamous cell carcinoma of skin of other parts of face: Secondary | ICD-10-CM | POA: Insufficient documentation

## 2023-01-15 DIAGNOSIS — Z510 Encounter for antineoplastic radiation therapy: Secondary | ICD-10-CM | POA: Diagnosis present

## 2023-01-15 DIAGNOSIS — I130 Hypertensive heart and chronic kidney disease with heart failure and stage 1 through stage 4 chronic kidney disease, or unspecified chronic kidney disease: Secondary | ICD-10-CM | POA: Insufficient documentation

## 2023-01-15 DIAGNOSIS — Z85828 Personal history of other malignant neoplasm of skin: Secondary | ICD-10-CM | POA: Diagnosis not present

## 2023-01-15 DIAGNOSIS — Z79899 Other long term (current) drug therapy: Secondary | ICD-10-CM | POA: Insufficient documentation

## 2023-01-15 DIAGNOSIS — R918 Other nonspecific abnormal finding of lung field: Secondary | ICD-10-CM | POA: Diagnosis not present

## 2023-01-15 DIAGNOSIS — J449 Chronic obstructive pulmonary disease, unspecified: Secondary | ICD-10-CM | POA: Insufficient documentation

## 2023-01-15 DIAGNOSIS — I509 Heart failure, unspecified: Secondary | ICD-10-CM | POA: Diagnosis not present

## 2023-01-15 DIAGNOSIS — N189 Chronic kidney disease, unspecified: Secondary | ICD-10-CM | POA: Insufficient documentation

## 2023-01-15 DIAGNOSIS — Z9981 Dependence on supplemental oxygen: Secondary | ICD-10-CM | POA: Diagnosis not present

## 2023-01-15 DIAGNOSIS — E785 Hyperlipidemia, unspecified: Secondary | ICD-10-CM | POA: Diagnosis not present

## 2023-01-15 DIAGNOSIS — F0390 Unspecified dementia without behavioral disturbance: Secondary | ICD-10-CM | POA: Insufficient documentation

## 2023-01-15 DIAGNOSIS — Z7982 Long term (current) use of aspirin: Secondary | ICD-10-CM | POA: Diagnosis not present

## 2023-01-15 DIAGNOSIS — M8580 Other specified disorders of bone density and structure, unspecified site: Secondary | ICD-10-CM | POA: Diagnosis not present

## 2023-01-15 DIAGNOSIS — D0439 Carcinoma in situ of skin of other parts of face: Secondary | ICD-10-CM

## 2023-01-15 NOTE — Progress Notes (Signed)
Oncology Nurse Navigator Documentation   Met with patient during initial consult with Dr. Basilio Cairo. She was accompanied by her son, Karen Kitchens.  Further introduced myself as her/their Navigator, explained my role as a member of the Care Team. They verbalized understanding of information provided. I encouraged them to call with questions/concerns moving forward.  Hedda Slade, RN, BSN, OCN Head & Neck Oncology Nurse Navigator [facility] [place] [phone]

## 2023-01-22 ENCOUNTER — Telehealth: Payer: Self-pay | Admitting: Family Medicine

## 2023-01-22 NOTE — Telephone Encounter (Signed)
Patient son aware that Carrie Crawford will order labs at appt

## 2023-01-22 NOTE — Telephone Encounter (Signed)
Yes, I will get labs at that appointment.

## 2023-01-25 DIAGNOSIS — C44329 Squamous cell carcinoma of skin of other parts of face: Secondary | ICD-10-CM | POA: Diagnosis not present

## 2023-01-25 DIAGNOSIS — Z510 Encounter for antineoplastic radiation therapy: Secondary | ICD-10-CM | POA: Diagnosis not present

## 2023-01-27 ENCOUNTER — Other Ambulatory Visit: Payer: Self-pay

## 2023-01-27 ENCOUNTER — Ambulatory Visit
Admission: RE | Admit: 2023-01-27 | Discharge: 2023-01-27 | Disposition: A | Discharge status: Home / Self Care | Source: Ambulatory Visit | Attending: Radiation Oncology | Admitting: Radiation Oncology

## 2023-01-27 ENCOUNTER — Ambulatory Visit: Admitting: Radiation Oncology

## 2023-01-27 DIAGNOSIS — C44329 Squamous cell carcinoma of skin of other parts of face: Secondary | ICD-10-CM | POA: Diagnosis present

## 2023-01-27 DIAGNOSIS — Z510 Encounter for antineoplastic radiation therapy: Secondary | ICD-10-CM | POA: Diagnosis present

## 2023-01-27 LAB — RAD ONC ARIA SESSION SUMMARY
Course Elapsed Days: 0
Plan Fractions Treated to Date: 1
Plan Prescribed Dose Per Fraction: 7 Gy
Plan Total Fractions Prescribed: 5
Plan Total Prescribed Dose: 35 Gy
Reference Point Dosage Given to Date: 7 Gy
Reference Point Session Dosage Given: 7 Gy
Session Number: 1

## 2023-01-28 ENCOUNTER — Ambulatory Visit: Payer: Medicare Other | Admitting: Family Medicine

## 2023-01-29 ENCOUNTER — Other Ambulatory Visit: Payer: Self-pay

## 2023-01-29 ENCOUNTER — Ambulatory Visit
Admission: RE | Admit: 2023-01-29 | Discharge: 2023-01-29 | Disposition: A | Discharge status: Home / Self Care | Source: Ambulatory Visit | Attending: Radiation Oncology | Admitting: Radiation Oncology

## 2023-01-29 DIAGNOSIS — Z510 Encounter for antineoplastic radiation therapy: Secondary | ICD-10-CM | POA: Diagnosis not present

## 2023-01-29 DIAGNOSIS — C44329 Squamous cell carcinoma of skin of other parts of face: Secondary | ICD-10-CM | POA: Diagnosis not present

## 2023-01-29 LAB — RAD ONC ARIA SESSION SUMMARY
Course Elapsed Days: 2
Plan Fractions Treated to Date: 2
Plan Prescribed Dose Per Fraction: 7 Gy
Plan Total Fractions Prescribed: 5
Plan Total Prescribed Dose: 35 Gy
Reference Point Dosage Given to Date: 14 Gy
Reference Point Session Dosage Given: 7 Gy
Session Number: 2

## 2023-02-01 ENCOUNTER — Other Ambulatory Visit: Payer: Self-pay

## 2023-02-01 ENCOUNTER — Ambulatory Visit
Admission: RE | Admit: 2023-02-01 | Discharge: 2023-02-01 | Disposition: A | Discharge status: Home / Self Care | Source: Ambulatory Visit | Attending: Radiation Oncology | Admitting: Radiation Oncology

## 2023-02-01 DIAGNOSIS — Z510 Encounter for antineoplastic radiation therapy: Secondary | ICD-10-CM | POA: Diagnosis not present

## 2023-02-01 DIAGNOSIS — C44329 Squamous cell carcinoma of skin of other parts of face: Secondary | ICD-10-CM | POA: Diagnosis not present

## 2023-02-01 LAB — RAD ONC ARIA SESSION SUMMARY
Course Elapsed Days: 5
Plan Fractions Treated to Date: 3
Plan Prescribed Dose Per Fraction: 7 Gy
Plan Total Fractions Prescribed: 5
Plan Total Prescribed Dose: 35 Gy
Reference Point Dosage Given to Date: 21 Gy
Reference Point Session Dosage Given: 7 Gy
Session Number: 3

## 2023-02-03 ENCOUNTER — Other Ambulatory Visit: Payer: Self-pay

## 2023-02-03 ENCOUNTER — Ambulatory Visit
Admission: RE | Admit: 2023-02-03 | Discharge: 2023-02-03 | Disposition: A | Discharge status: Home / Self Care | Source: Ambulatory Visit | Attending: Radiation Oncology | Admitting: Radiation Oncology

## 2023-02-03 DIAGNOSIS — Z510 Encounter for antineoplastic radiation therapy: Secondary | ICD-10-CM | POA: Diagnosis not present

## 2023-02-03 DIAGNOSIS — C44329 Squamous cell carcinoma of skin of other parts of face: Secondary | ICD-10-CM | POA: Diagnosis not present

## 2023-02-03 LAB — RAD ONC ARIA SESSION SUMMARY
Course Elapsed Days: 7
Plan Fractions Treated to Date: 4
Plan Prescribed Dose Per Fraction: 7 Gy
Plan Total Fractions Prescribed: 5
Plan Total Prescribed Dose: 35 Gy
Reference Point Dosage Given to Date: 28 Gy
Reference Point Session Dosage Given: 7 Gy
Session Number: 4

## 2023-02-05 ENCOUNTER — Other Ambulatory Visit: Payer: Self-pay

## 2023-02-05 ENCOUNTER — Ambulatory Visit
Admission: RE | Admit: 2023-02-05 | Discharge: 2023-02-05 | Disposition: A | Discharge status: Home / Self Care | Source: Ambulatory Visit | Attending: Radiation Oncology | Admitting: Radiation Oncology

## 2023-02-05 DIAGNOSIS — Z510 Encounter for antineoplastic radiation therapy: Secondary | ICD-10-CM | POA: Diagnosis not present

## 2023-02-05 DIAGNOSIS — C44329 Squamous cell carcinoma of skin of other parts of face: Secondary | ICD-10-CM | POA: Diagnosis not present

## 2023-02-05 LAB — RAD ONC ARIA SESSION SUMMARY
Course Elapsed Days: 9
Plan Fractions Treated to Date: 5
Plan Prescribed Dose Per Fraction: 7 Gy
Plan Total Fractions Prescribed: 5
Plan Total Prescribed Dose: 35 Gy
Reference Point Dosage Given to Date: 35 Gy
Reference Point Session Dosage Given: 7 Gy
Session Number: 5

## 2023-02-10 ENCOUNTER — Other Ambulatory Visit: Payer: Self-pay | Admitting: Family Medicine

## 2023-02-10 ENCOUNTER — Ambulatory Visit (INDEPENDENT_AMBULATORY_CARE_PROVIDER_SITE_OTHER): Admitting: Family Medicine

## 2023-02-10 ENCOUNTER — Encounter: Payer: Self-pay | Admitting: Family Medicine

## 2023-02-10 VITALS — BP 99/58 | HR 75 | Temp 97.7°F | Ht 64.0 in

## 2023-02-10 DIAGNOSIS — J449 Chronic obstructive pulmonary disease, unspecified: Secondary | ICD-10-CM | POA: Diagnosis not present

## 2023-02-10 DIAGNOSIS — E876 Hypokalemia: Secondary | ICD-10-CM | POA: Diagnosis not present

## 2023-02-10 DIAGNOSIS — I5032 Chronic diastolic (congestive) heart failure: Secondary | ICD-10-CM | POA: Diagnosis not present

## 2023-02-10 DIAGNOSIS — I700 Atherosclerosis of aorta: Secondary | ICD-10-CM

## 2023-02-10 DIAGNOSIS — I110 Hypertensive heart disease with heart failure: Secondary | ICD-10-CM | POA: Diagnosis not present

## 2023-02-10 DIAGNOSIS — D649 Anemia, unspecified: Secondary | ICD-10-CM | POA: Diagnosis not present

## 2023-02-10 DIAGNOSIS — I10 Essential (primary) hypertension: Secondary | ICD-10-CM | POA: Diagnosis not present

## 2023-02-10 DIAGNOSIS — D0439 Carcinoma in situ of skin of other parts of face: Secondary | ICD-10-CM | POA: Diagnosis not present

## 2023-02-10 NOTE — Progress Notes (Unsigned)
   Established Patient Office Visit  Subjective   Patient ID: Carrie Crawford, female    DOB: [birth year]  Age: 89 y.o. MRN: [id number]  Chief Complaint  Patient presents with   Medical Management of Chronic Issues   Hypertension    HPI Here with son Carrie Crawford today who is her caregiver. Carrie Crawford now has a Carrie Crawford that is wheelchair accessible so he is able to get Carrie Crawford to appointment. Hospice was momentarily suspended while she completed radition treatments for her skin cancer. She will finish this up next week and hospice home care will resume.   BP has been well controlled at home. Denies chest pain, shortness of breath, edema.   Regular diet. Wheelchair/bed bound.    {History (Optional):23778}  ROS    Objective:     BP (!) 99/58   Pulse 75   Temp 97.7 F (36.5 C) (Temporal)   Ht 5\' 4"  (1.626 m)   SpO2 97%   BMI 29.18 kg/m  {Vitals History (Optional):23777}  Physical Exam   No results found for any visits on 02/10/23.  {Labs (Optional):23779}  The ASCVD Risk score (Arnett DK, et al., 2019) failed to calculate for the following reasons:   The 2019 ASCVD risk score is only valid for ages 58 to 43    Assessment & Plan:   Problem List Items Addressed This Visit   None   No follow-ups on file.    Gabriel Earing, FNP

## 2023-02-11 ENCOUNTER — Other Ambulatory Visit: Payer: Self-pay | Admitting: *Deleted

## 2023-02-11 DIAGNOSIS — N289 Disorder of kidney and ureter, unspecified: Secondary | ICD-10-CM

## 2023-02-11 LAB — CBC WITH DIFFERENTIAL/PLATELET
Basophils Absolute: 0.1 10*3/uL (ref 0.0–0.2)
Basos: 1 %
EOS (ABSOLUTE): 0.7 10*3/uL — ABNORMAL HIGH (ref 0.0–0.4)
Eos: 7 %
Hematocrit: 29.8 % — ABNORMAL LOW (ref 34.0–46.6)
Hemoglobin: 10 g/dL — ABNORMAL LOW (ref 11.1–15.9)
Immature Grans (Abs): 0 10*3/uL (ref 0.0–0.1)
Immature Granulocytes: 0 %
Lymphocytes Absolute: 2.5 10*3/uL (ref 0.7–3.1)
Lymphs: 25 %
MCH: 32.1 pg (ref 26.6–33.0)
MCHC: 33.6 g/dL (ref 31.5–35.7)
MCV: 96 fL (ref 79–97)
Monocytes Absolute: 1 10*3/uL — ABNORMAL HIGH (ref 0.1–0.9)
Monocytes: 10 %
Neutrophils Absolute: 5.7 10*3/uL (ref 1.4–7.0)
Neutrophils: 57 %
Platelets: 329 10*3/uL (ref 150–450)
RBC: 3.12 x10E6/uL — ABNORMAL LOW (ref 3.77–5.28)
RDW: 12.5 % (ref 11.7–15.4)
WBC: 10 10*3/uL (ref 3.4–10.8)

## 2023-02-11 LAB — CMP14+EGFR
ALT: 7 IU/L (ref 0–32)
AST: 15 IU/L (ref 0–40)
Albumin: 2.8 g/dL — ABNORMAL LOW (ref 3.6–4.6)
Alkaline Phosphatase: 98 IU/L (ref 44–121)
BUN/Creatinine Ratio: 11 — ABNORMAL LOW (ref 12–28)
BUN: 19 mg/dL (ref 10–36)
Bilirubin Total: <0.2 mg/dL (ref 0.0–1.2)
CO2: 26 mmol/L (ref 20–29)
Calcium: 8.1 mg/dL — ABNORMAL LOW (ref 8.7–10.3)
Chloride: 99 mmol/L (ref 96–106)
Creatinine, Ser: 1.68 mg/dL — ABNORMAL HIGH (ref 0.57–1.00)
Globulin, Total: 3.7 g/dL (ref 1.5–4.5)
Glucose: 109 mg/dL — ABNORMAL HIGH (ref 70–99)
Potassium: 3.6 mmol/L (ref 3.5–5.2)
Sodium: 136 mmol/L (ref 134–144)
Total Protein: 6.5 g/dL (ref 6.0–8.5)
eGFR: 28 mL/min/{1.73_m2} — ABNORMAL LOW (ref >59)

## 2023-02-11 LAB — TSH: TSH: 2.83 u[IU]/mL (ref 0.450–4.500)

## 2023-02-11 LAB — LIPID PANEL
Chol/HDL Ratio: 3.8 ratio (ref 0.0–4.4)
Cholesterol, Total: 136 mg/dL (ref 100–199)
HDL: 36 mg/dL — ABNORMAL LOW (ref >39)
LDL Chol Calc (NIH): 73 mg/dL (ref 0–99)
Triglycerides: 154 mg/dL — ABNORMAL HIGH (ref 0–149)
VLDL Cholesterol Cal: 27 mg/dL (ref 5–40)

## 2023-02-12 LAB — IRON AND TIBC

## 2023-02-12 LAB — SPECIMEN STATUS REPORT

## 2023-02-12 NOTE — Radiation Completion Notes (Signed)
Patient Name: YAZMYNE, SARA MRN: [id number] Date of Birth: 10-Nov-1925 Referring Physician: Serena Colonel, M.D. Date of Service: 2023-02-12 Radiation Oncologist: Lonie Peak, M.D. [facility]                             RADIATION ONCOLOGY END OF TREATMENT NOTE     Diagnosis: C44.329 Squamous cell carcinoma of skin of other parts of face Staging on 2023-01-15: Squamous cell cancer of skin of left cheek T=cT3, N=cN0, M=cM0 Intent: Curative     ==========DELIVERED PLANS==========  First Treatment Date: 2023-01-27 - Last Treatment Date: 2023-02-05   Plan Name: [id number] Site: Cheek, Left Technique: 3D Mode: Photon Dose Per Fraction: 7 Gy Prescribed Dose (Delivered / Prescribed): 35 Gy / 35 Gy Prescribed Fxs (Delivered / Prescribed): 5 / 5     ==========ON TREATMENT VISIT DATES========== 2023-02-01     ==========UPCOMING VISITS==========       ==========APPENDIX - ON TREATMENT VISIT NOTES==========   See weekly On Treatment Notes in Epic for details.

## 2023-02-15 ENCOUNTER — Encounter: Payer: Self-pay | Admitting: Family Medicine

## 2023-02-17 ENCOUNTER — Telehealth: Payer: Self-pay | Admitting: Family Medicine

## 2023-02-17 DIAGNOSIS — R509 Fever, unspecified: Secondary | ICD-10-CM | POA: Diagnosis not present

## 2023-02-17 DIAGNOSIS — R5381 Other malaise: Secondary | ICD-10-CM | POA: Diagnosis not present

## 2023-02-17 NOTE — Telephone Encounter (Signed)
FYI

## 2023-02-18 ENCOUNTER — Other Ambulatory Visit: Payer: Medicare Other

## 2023-02-18 NOTE — Telephone Encounter (Signed)
Thank you :)

## 2023-02-19 ENCOUNTER — Other Ambulatory Visit: Payer: Medicare Other

## 2023-02-19 DIAGNOSIS — N289 Disorder of kidney and ureter, unspecified: Secondary | ICD-10-CM

## 2023-02-20 ENCOUNTER — Inpatient Hospital Stay (HOSPITAL_COMMUNITY)
Admission: EM | Admit: 2023-02-20 | Discharge: 2023-03-14 | DRG: 951 | Disposition: E | Attending: Internal Medicine | Admitting: Internal Medicine

## 2023-02-20 ENCOUNTER — Emergency Department (HOSPITAL_COMMUNITY)

## 2023-02-20 ENCOUNTER — Inpatient Hospital Stay (HOSPITAL_COMMUNITY)

## 2023-02-20 ENCOUNTER — Other Ambulatory Visit: Payer: Self-pay

## 2023-02-20 ENCOUNTER — Encounter (HOSPITAL_COMMUNITY): Payer: Self-pay

## 2023-02-20 DIAGNOSIS — I5032 Chronic diastolic (congestive) heart failure: Secondary | ICD-10-CM | POA: Diagnosis present

## 2023-02-20 DIAGNOSIS — D696 Thrombocytopenia, unspecified: Secondary | ICD-10-CM | POA: Insufficient documentation

## 2023-02-20 DIAGNOSIS — R918 Other nonspecific abnormal finding of lung field: Secondary | ICD-10-CM | POA: Diagnosis present

## 2023-02-20 DIAGNOSIS — Z7401 Bed confinement status: Secondary | ICD-10-CM | POA: Diagnosis not present

## 2023-02-20 DIAGNOSIS — Z9049 Acquired absence of other specified parts of digestive tract: Secondary | ICD-10-CM | POA: Diagnosis not present

## 2023-02-20 DIAGNOSIS — I499 Cardiac arrhythmia, unspecified: Secondary | ICD-10-CM | POA: Diagnosis not present

## 2023-02-20 DIAGNOSIS — Z515 Encounter for palliative care: Principal | ICD-10-CM

## 2023-02-20 DIAGNOSIS — J449 Chronic obstructive pulmonary disease, unspecified: Secondary | ICD-10-CM | POA: Diagnosis present

## 2023-02-20 DIAGNOSIS — E785 Hyperlipidemia, unspecified: Secondary | ICD-10-CM | POA: Diagnosis not present

## 2023-02-20 DIAGNOSIS — Z8249 Family history of ischemic heart disease and other diseases of the circulatory system: Secondary | ICD-10-CM | POA: Diagnosis not present

## 2023-02-20 DIAGNOSIS — J9601 Acute respiratory failure with hypoxia: Principal | ICD-10-CM | POA: Diagnosis present

## 2023-02-20 DIAGNOSIS — M8580 Other specified disorders of bone density and structure, unspecified site: Secondary | ICD-10-CM | POA: Diagnosis present

## 2023-02-20 DIAGNOSIS — I700 Atherosclerosis of aorta: Secondary | ICD-10-CM | POA: Diagnosis not present

## 2023-02-20 DIAGNOSIS — D65 Disseminated intravascular coagulation [defibrination syndrome]: Secondary | ICD-10-CM | POA: Diagnosis not present

## 2023-02-20 DIAGNOSIS — J90 Pleural effusion, not elsewhere classified: Secondary | ICD-10-CM | POA: Diagnosis not present

## 2023-02-20 DIAGNOSIS — R6521 Severe sepsis with septic shock: Secondary | ICD-10-CM | POA: Diagnosis present

## 2023-02-20 DIAGNOSIS — E872 Acidosis, unspecified: Secondary | ICD-10-CM | POA: Diagnosis not present

## 2023-02-20 DIAGNOSIS — R069 Unspecified abnormalities of breathing: Secondary | ICD-10-CM | POA: Diagnosis not present

## 2023-02-20 DIAGNOSIS — A419 Sepsis, unspecified organism: Secondary | ICD-10-CM | POA: Diagnosis present

## 2023-02-20 DIAGNOSIS — R6889 Other general symptoms and signs: Secondary | ICD-10-CM | POA: Diagnosis not present

## 2023-02-20 DIAGNOSIS — I4891 Unspecified atrial fibrillation: Secondary | ICD-10-CM | POA: Diagnosis not present

## 2023-02-20 DIAGNOSIS — F0390 Unspecified dementia without behavioral disturbance: Secondary | ICD-10-CM | POA: Diagnosis present

## 2023-02-20 DIAGNOSIS — E860 Dehydration: Secondary | ICD-10-CM | POA: Diagnosis present

## 2023-02-20 DIAGNOSIS — R932 Abnormal findings on diagnostic imaging of liver and biliary tract: Secondary | ICD-10-CM | POA: Diagnosis not present

## 2023-02-20 DIAGNOSIS — K219 Gastro-esophageal reflux disease without esophagitis: Secondary | ICD-10-CM | POA: Diagnosis not present

## 2023-02-20 DIAGNOSIS — I5030 Unspecified diastolic (congestive) heart failure: Secondary | ICD-10-CM | POA: Diagnosis present

## 2023-02-20 DIAGNOSIS — E871 Hypo-osmolality and hyponatremia: Secondary | ICD-10-CM | POA: Diagnosis not present

## 2023-02-20 DIAGNOSIS — Z9981 Dependence on supplemental oxygen: Secondary | ICD-10-CM

## 2023-02-20 DIAGNOSIS — J9621 Acute and chronic respiratory failure with hypoxia: Secondary | ICD-10-CM | POA: Diagnosis not present

## 2023-02-20 DIAGNOSIS — L89154 Pressure ulcer of sacral region, stage 4: Secondary | ICD-10-CM | POA: Diagnosis present

## 2023-02-20 DIAGNOSIS — Z743 Need for continuous supervision: Secondary | ICD-10-CM | POA: Diagnosis not present

## 2023-02-20 DIAGNOSIS — Z7982 Long term (current) use of aspirin: Secondary | ICD-10-CM

## 2023-02-20 DIAGNOSIS — I110 Hypertensive heart disease with heart failure: Secondary | ICD-10-CM | POA: Diagnosis present

## 2023-02-20 DIAGNOSIS — Z1152 Encounter for screening for COVID-19: Secondary | ICD-10-CM | POA: Diagnosis not present

## 2023-02-20 DIAGNOSIS — Z79899 Other long term (current) drug therapy: Secondary | ICD-10-CM

## 2023-02-20 DIAGNOSIS — Z8049 Family history of malignant neoplasm of other genital organs: Secondary | ICD-10-CM

## 2023-02-20 DIAGNOSIS — I10 Essential (primary) hypertension: Secondary | ICD-10-CM | POA: Diagnosis present

## 2023-02-20 DIAGNOSIS — N179 Acute kidney failure, unspecified: Secondary | ICD-10-CM | POA: Insufficient documentation

## 2023-02-20 DIAGNOSIS — R0689 Other abnormalities of breathing: Secondary | ICD-10-CM | POA: Diagnosis not present

## 2023-02-20 DIAGNOSIS — E876 Hypokalemia: Secondary | ICD-10-CM | POA: Diagnosis not present

## 2023-02-20 DIAGNOSIS — Z66 Do not resuscitate: Secondary | ICD-10-CM | POA: Diagnosis not present

## 2023-02-20 DIAGNOSIS — Z7951 Long term (current) use of inhaled steroids: Secondary | ICD-10-CM

## 2023-02-20 DIAGNOSIS — R6520 Severe sepsis without septic shock: Secondary | ICD-10-CM | POA: Diagnosis not present

## 2023-02-20 DIAGNOSIS — R7989 Other specified abnormal findings of blood chemistry: Secondary | ICD-10-CM | POA: Insufficient documentation

## 2023-02-20 DIAGNOSIS — D0439 Carcinoma in situ of skin of other parts of face: Secondary | ICD-10-CM | POA: Diagnosis present

## 2023-02-20 LAB — RESP PANEL BY RT-PCR (RSV, FLU A&B, COVID)  RVPGX2
Influenza A by PCR: NEGATIVE
Influenza B by PCR: NEGATIVE
Resp Syncytial Virus by PCR: NEGATIVE
SARS Coronavirus 2 by RT PCR: NEGATIVE

## 2023-02-20 LAB — COMPREHENSIVE METABOLIC PANEL
ALT: 87 U/L — ABNORMAL HIGH (ref 0–44)
AST: 357 U/L — ABNORMAL HIGH (ref 15–41)
Albumin: 2 g/dL — ABNORMAL LOW (ref 3.5–5.0)
Alkaline Phosphatase: 130 U/L — ABNORMAL HIGH (ref 38–126)
Anion gap: 22 — ABNORMAL HIGH (ref 5–15)
BUN: 48 mg/dL — ABNORMAL HIGH (ref 8–23)
CO2: 12 mmol/L — ABNORMAL LOW (ref 22–32)
Calcium: 6.5 mg/dL — ABNORMAL LOW (ref 8.9–10.3)
Chloride: 87 mmol/L — ABNORMAL LOW (ref 98–111)
Creatinine, Ser: 4.11 mg/dL — ABNORMAL HIGH (ref 0.44–1.00)
GFR, Estimated: 10 mL/min — ABNORMAL LOW (ref >60)
Glucose, Bld: 102 mg/dL — ABNORMAL HIGH (ref 70–99)
Potassium: 2.6 mmol/L — CL (ref 3.5–5.1)
Sodium: 123 mmol/L — ABNORMAL LOW (ref 135–145)
Total Bilirubin: 0.8 mg/dL (ref 0.3–1.2)
Total Protein: 6.2 g/dL — ABNORMAL LOW (ref 6.5–8.1)

## 2023-02-20 LAB — CBC WITH DIFFERENTIAL/PLATELET
Abs Immature Granulocytes: 0.8 10*3/uL — ABNORMAL HIGH (ref 0.00–0.07)
Basophils Absolute: 0 10*3/uL (ref 0.0–0.1)
Basophils Relative: 0 %
Eosinophils Absolute: 0 10*3/uL (ref 0.0–0.5)
Eosinophils Relative: 0 %
HCT: 31.5 % — ABNORMAL LOW (ref 36.0–46.0)
Hemoglobin: 10.6 g/dL — ABNORMAL LOW (ref 12.0–15.0)
Lymphocytes Relative: 1 %
Lymphs Abs: 0.1 10*3/uL — ABNORMAL LOW (ref 0.7–4.0)
MCH: 31.9 pg (ref 26.0–34.0)
MCHC: 33.7 g/dL (ref 30.0–36.0)
MCV: 94.9 fL (ref 80.0–100.0)
Metamyelocytes Relative: 8 %
Monocytes Absolute: 0 10*3/uL — ABNORMAL LOW (ref 0.1–1.0)
Monocytes Relative: 0 %
Neutro Abs: 9.6 10*3/uL — ABNORMAL HIGH (ref 1.7–7.7)
Neutrophils Relative %: 91 %
Platelets: 41 10*3/uL — ABNORMAL LOW (ref 150–400)
RBC: 3.32 MIL/uL — ABNORMAL LOW (ref 3.87–5.11)
RDW: 15 % (ref 11.5–15.5)
WBC: 10.6 10*3/uL — ABNORMAL HIGH (ref 4.0–10.5)
nRBC: 0.8 % — ABNORMAL HIGH (ref 0.0–0.2)

## 2023-02-20 LAB — BASIC METABOLIC PANEL
Anion gap: 22 — ABNORMAL HIGH (ref 5–15)
BUN: 47 mg/dL — ABNORMAL HIGH (ref 8–23)
CO2: 11 mmol/L — ABNORMAL LOW (ref 22–32)
Calcium: 6.2 mg/dL — CL (ref 8.9–10.3)
Chloride: 89 mmol/L — ABNORMAL LOW (ref 98–111)
Creatinine, Ser: 4 mg/dL — ABNORMAL HIGH (ref 0.44–1.00)
GFR, Estimated: 10 mL/min — ABNORMAL LOW (ref >60)
Glucose, Bld: 77 mg/dL (ref 70–99)
Potassium: 2.7 mmol/L — CL (ref 3.5–5.1)
Sodium: 122 mmol/L — ABNORMAL LOW (ref 135–145)

## 2023-02-20 LAB — LACTIC ACID, PLASMA
Lactic Acid, Venous: 7.4 mmol/L (ref 0.5–1.9)
Lactic Acid, Venous: 8 mmol/L (ref 0.5–1.9)
Lactic Acid, Venous: >9 mmol/L (ref 0.5–1.9)

## 2023-02-20 LAB — D-DIMER, QUANTITATIVE: D-Dimer, Quant: >20 ug/mL-FEU — ABNORMAL HIGH (ref 0.00–0.50)

## 2023-02-20 LAB — URINALYSIS, W/ REFLEX TO CULTURE (INFECTION SUSPECTED)
Bilirubin Urine: NEGATIVE
Glucose, UA: NEGATIVE mg/dL
Hgb urine dipstick: NEGATIVE
Ketones, ur: NEGATIVE mg/dL
Leukocytes,Ua: NEGATIVE
Nitrite: NEGATIVE
Protein, ur: 100 mg/dL — AB
Specific Gravity, Urine: 1.021 (ref 1.005–1.030)
pH: 5 (ref 5.0–8.0)

## 2023-02-20 LAB — PROTIME-INR
INR: 1.8 — ABNORMAL HIGH (ref 0.8–1.2)
Prothrombin Time: 21.1 seconds — ABNORMAL HIGH (ref 11.4–15.2)

## 2023-02-20 LAB — TROPONIN I (HIGH SENSITIVITY)
Troponin I (High Sensitivity): 88 ng/L — ABNORMAL HIGH (ref <18)
Troponin I (High Sensitivity): 81 ng/L — ABNORMAL HIGH (ref <18)

## 2023-02-20 LAB — BRAIN NATRIURETIC PEPTIDE: B Natriuretic Peptide: 200 pg/mL — ABNORMAL HIGH (ref 0.0–100.0)

## 2023-02-20 LAB — APTT: aPTT: 119 seconds — ABNORMAL HIGH (ref 24–36)

## 2023-02-20 LAB — FIBRINOGEN: Fibrinogen: 172 mg/dL — ABNORMAL LOW (ref 210–475)

## 2023-02-20 MED ORDER — VANCOMYCIN HCL 1500 MG/300ML IV SOLN
1500.0000 mg | Freq: Once | INTRAVENOUS | Status: DC
  Filled 2023-02-20: qty 300

## 2023-02-20 MED ORDER — HALOPERIDOL LACTATE 2 MG/ML PO CONC: 0.5000 mg | ORAL | Status: DC | PRN

## 2023-02-20 MED ORDER — ACETAMINOPHEN 650 MG RE SUPP: 650.0000 mg | Freq: Four times a day (QID) | RECTAL | Status: DC | PRN

## 2023-02-20 MED ORDER — POTASSIUM CHLORIDE 10 MEQ/100ML IV SOLN
10.0000 meq | INTRAVENOUS | Status: DC
  Administered 2023-02-20: 10 meq via INTRAVENOUS
  Filled 2023-02-20: qty 100

## 2023-02-20 MED ORDER — LORAZEPAM 2 MG/ML PO CONC
1.0000 mg | ORAL | Status: DC | PRN
  Filled 2023-02-20: qty 0.5

## 2023-02-20 MED ORDER — HALOPERIDOL LACTATE 5 MG/ML IJ SOLN: 0.5000 mg | INTRAMUSCULAR | Status: DC | PRN

## 2023-02-20 MED ORDER — HYDROMORPHONE HCL 1 MG/ML IJ SOLN
0.5000 mg | INTRAMUSCULAR | Status: DC | PRN
  Administered 2023-02-21: 0.5 mg via INTRAVENOUS
  Filled 2023-02-20: qty 0.5

## 2023-02-20 MED ORDER — LACTATED RINGERS IV SOLN: 150.0000 mL/h | INTRAVENOUS | Status: DC

## 2023-02-20 MED ORDER — SODIUM CHLORIDE 0.9 % IV SOLN
500.0000 mg | INTRAVENOUS | Status: DC
  Administered 2023-02-20: 500 mg via INTRAVENOUS
  Filled 2023-02-20: qty 5

## 2023-02-20 MED ORDER — DIPHENHYDRAMINE HCL 50 MG/ML IJ SOLN: 12.5000 mg | INTRAMUSCULAR | Status: DC | PRN

## 2023-02-20 MED ORDER — SODIUM CHLORIDE 0.9 % IV SOLN: INTRAVENOUS | Status: DC

## 2023-02-20 MED ORDER — GLYCOPYRROLATE 0.2 MG/ML IJ SOLN: 0.2000 mg | INTRAMUSCULAR | Status: DC | PRN

## 2023-02-20 MED ORDER — LACTATED RINGERS IV BOLUS
2000.0000 mL | Freq: Once | INTRAVENOUS | Status: AC
  Administered 2023-02-20: 2000 mL via INTRAVENOUS

## 2023-02-20 MED ORDER — HALOPERIDOL 0.5 MG PO TABS: 0.5000 mg | ORAL_TABLET | ORAL | Status: DC | PRN

## 2023-02-20 MED ORDER — MAGNESIUM SULFATE 2 GM/50ML IV SOLN
2.0000 g | INTRAVENOUS | Status: AC
  Administered 2023-02-20: 2 g via INTRAVENOUS
  Filled 2023-02-20: qty 50

## 2023-02-20 MED ORDER — ONDANSETRON HCL 4 MG/2ML IJ SOLN: 4.0000 mg | Freq: Four times a day (QID) | INTRAMUSCULAR | Status: DC | PRN

## 2023-02-20 MED ORDER — LOPERAMIDE HCL 2 MG PO CAPS: 2.0000 mg | ORAL_CAPSULE | ORAL | Status: DC | PRN

## 2023-02-20 MED ORDER — SODIUM CHLORIDE 0.9 % IV SOLN: 1.0000 g | INTRAVENOUS | Status: DC

## 2023-02-20 MED ORDER — SODIUM CHLORIDE 0.9 % IV SOLN
2.0000 g | INTRAVENOUS | Status: DC
  Administered 2023-02-20: 2 g via INTRAVENOUS
  Filled 2023-02-20: qty 20

## 2023-02-20 MED ORDER — ACETAMINOPHEN 325 MG PO TABS: 650.0000 mg | ORAL_TABLET | Freq: Four times a day (QID) | ORAL | Status: DC | PRN

## 2023-02-20 MED ORDER — VANCOMYCIN HCL IN DEXTROSE 1-5 GM/200ML-% IV SOLN: 1000.0000 mg | Freq: Once | INTRAVENOUS | Status: DC

## 2023-02-20 MED ORDER — METRONIDAZOLE 500 MG/100ML IV SOLN: 500.0000 mg | Freq: Two times a day (BID) | INTRAVENOUS | Status: DC

## 2023-02-20 MED ORDER — GLYCOPYRROLATE 1 MG PO TABS: 1.0000 mg | ORAL_TABLET | ORAL | Status: DC | PRN

## 2023-02-20 MED ORDER — LACTATED RINGERS IV SOLN: INTRAVENOUS | Status: DC

## 2023-02-20 MED ORDER — LORAZEPAM 1 MG PO TABS: 1.0000 mg | ORAL_TABLET | ORAL | Status: DC | PRN

## 2023-02-20 MED ORDER — ONDANSETRON HCL 4 MG PO TABS: 4.0000 mg | ORAL_TABLET | Freq: Four times a day (QID) | ORAL | Status: DC | PRN

## 2023-02-20 MED ORDER — LORAZEPAM 2 MG/ML IJ SOLN: 1.0000 mg | INTRAMUSCULAR | Status: DC | PRN

## 2023-02-20 MED ORDER — POLYVINYL ALCOHOL 1.4 % OP SOLN: 1.0000 [drp] | Freq: Four times a day (QID) | OPHTHALMIC | Status: DC | PRN

## 2023-02-20 MED ORDER — ONDANSETRON 4 MG PO TBDP: 4.0000 mg | ORAL_TABLET | Freq: Four times a day (QID) | ORAL | Status: DC | PRN

## 2023-02-20 MED ORDER — SODIUM CHLORIDE 0.9 % IV SOLN
2.0000 g | Freq: Once | INTRAVENOUS | Status: AC
  Administered 2023-02-20: 2 g via INTRAVENOUS
  Filled 2023-02-20: qty 12.5

## 2023-02-20 MED ORDER — BIOTENE DRY MOUTH MT LIQD: 15.0000 mL | OROMUCOSAL | Status: DC | PRN

## 2023-02-20 MED ORDER — POTASSIUM CHLORIDE 10 MEQ/100ML IV SOLN
10.0000 meq | Freq: Once | INTRAVENOUS | Status: AC
  Administered 2023-02-20: 10 meq via INTRAVENOUS
  Filled 2023-02-20: qty 100

## 2023-02-20 NOTE — H&P (Signed)
Total critical care time: 90 minutes  Critical care time was exclusive of separately billable procedures and treating other patients.  Critical care was necessary to treat or prevent imminent or life-threatening deterioration.  Critical care was time spent personally by me on the following activities: development of treatment plan with patient and/or surrogate as well as nursing, discussions with consultants, evaluation of patient's response to treatment, examination of patient, obtaining history from patient or surrogate, ordering and performing treatments and interventions, ordering and review of laboratory studies, ordering and review of radiographic studies, pulse oximetry and re-evaluation of patient's condition.    Advance Care Planning:   Code Status: DNR   Consults: none  Family Communication: discussed with her son  Severity of Illness: The appropriate patient status for this patient is INPATIENT. Inpatient status is judged to be reasonable and necessary in order to provide the required intensity of service to ensure the patient's safety. The patient's presenting symptoms, physical exam findings, and initial radiographic and laboratory data in the context of their chronic comorbidities is felt to place them at high risk for further clinical deterioration. Furthermore, it is not anticipated that the patient will be medically stable for discharge from the hospital within 2 midnights of admission.   * I certify that at the point of admission it is my clinical judgment that the patient will require inpatient hospital care  spanning beyond 2 midnights from the point of admission due to high intensity of service, high risk for further deterioration and high frequency of surveillance required.*  Author: Levie Heritage, DO 02/22/2023 8:04 PM  For on call review [email or web address].  4:13 PM   Result Value Ref Range   Prothrombin Time 21.1 (H) 11.4 - 15.2 seconds   INR 1.8 (H) 0.8 - 1.2  APTT     Status: Abnormal   Collection Time: 03/03/2023  4:13 PM  Result Value Ref Range   aPTT 119 (H) 24 - 36 seconds  CBC with Differential/Platelet     Status: Abnormal   Collection Time: 03/06/2023  4:30 PM  Result Value Ref Range   WBC 10.6 (H) 4.0 - 10.5 K/uL   RBC 3.32 (L) 3.87 - 5.11 MIL/uL   Hemoglobin 10.6 (L) 12.0 - 15.0 g/dL   HCT 78.2 (L) 95.6 - 21.3 %   MCV 94.9 80.0 - 100.0 fL   MCH 31.9 26.0 - 34.0 pg   MCHC 33.7 30.0 - 36.0 g/dL   RDW 08.6 57.8 - 46.9 %   Platelets 41 (L) 150 - 400 K/uL   nRBC 0.8 (H) 0.0 - 0.2 %   Neutrophils Relative % 91 %   Neutro Abs 9.6 (H) 1.7 - 7.7 K/uL   Lymphocytes Relative 1 %   Lymphs Abs 0.1 (L) 0.7 - 4.0 K/uL   Monocytes Relative 0 %   Monocytes Absolute 0.0 (L) 0.1 - 1.0 K/uL   Eosinophils Relative 0 %   Eosinophils Absolute 0.0 0.0 - 0.5 K/uL   Basophils Relative 0 %   Basophils Absolute 0.0 0.0 - 0.1 K/uL   WBC Morphology Mild Left Shift (1-5% metas, occ myelo)    Smear Review PLATELET COUNT CONFIRMED BY SMEAR    Metamyelocytes Relative 8 %   Abs Immature Granulocytes 0.80 (H) 0.00 - 0.07 K/uL   Burr Cells PRESENT   Comprehensive metabolic panel     Status: Abnormal   Collection Time: 03/12/2023  4:30 PM  Result Value Ref Range   Sodium 123 (L) 135 - 145 mmol/L   Potassium 2.6 (LL) 3.5 - 5.1 mmol/L   Chloride 87 (L) 98 - 111 mmol/L   CO2 12 (L) 22 - 32 mmol/L   Glucose, Bld 102 (H) 70 - 99 mg/dL   BUN 48 (H) 8 - 23 mg/dL   Creatinine, Ser 6.29 (H) 0.44 - 1.00 mg/dL   Calcium 6.5 (L) 8.9 - 10.3 mg/dL   Total Protein 6.2 (L) 6.5 - 8.1 g/dL   Albumin 2.0 (L) 3.5 - 5.0 g/dL   AST 528 (H) 15 - 41 U/L   ALT 87 (H) 0 - 44 U/L   Alkaline Phosphatase 130 (H) 38 - 126 U/L   Total Bilirubin 0.8 0.3 - 1.2 mg/dL   GFR, Estimated 10 (L) >60 mL/min   Anion gap 22.0 (H) 5 - 15  Troponin I (High Sensitivity)     Status: Abnormal    Collection Time: 02/28/2023  4:30 PM  Result Value Ref Range   Troponin I (High Sensitivity) 88 (H) <18 ng/L  Resp panel by RT-PCR (RSV, Flu A&B, Covid) Anterior Nasal Swab     Status: None   Collection Time: 03/11/2023  5:06 PM   Specimen: Anterior Nasal Swab  Result Value Ref Range   SARS Coronavirus 2 by RT PCR NEGATIVE NEGATIVE   Influenza A by PCR NEGATIVE NEGATIVE   Influenza B by PCR NEGATIVE NEGATIVE   Resp Syncytial Virus by PCR NEGATIVE NEGATIVE  Lactic acid, plasma     Status: Abnormal   Collection Time: 03/05/2023  5:56 PM  Result Value Ref Range   Lactic Acid, Venous 8.0 (HH) 0.5 - 1.9 mmol/L  4:13 PM   Result Value Ref Range   Prothrombin Time 21.1 (H) 11.4 - 15.2 seconds   INR 1.8 (H) 0.8 - 1.2  APTT     Status: Abnormal   Collection Time: 03/03/2023  4:13 PM  Result Value Ref Range   aPTT 119 (H) 24 - 36 seconds  CBC with Differential/Platelet     Status: Abnormal   Collection Time: 03/06/2023  4:30 PM  Result Value Ref Range   WBC 10.6 (H) 4.0 - 10.5 K/uL   RBC 3.32 (L) 3.87 - 5.11 MIL/uL   Hemoglobin 10.6 (L) 12.0 - 15.0 g/dL   HCT 78.2 (L) 95.6 - 21.3 %   MCV 94.9 80.0 - 100.0 fL   MCH 31.9 26.0 - 34.0 pg   MCHC 33.7 30.0 - 36.0 g/dL   RDW 08.6 57.8 - 46.9 %   Platelets 41 (L) 150 - 400 K/uL   nRBC 0.8 (H) 0.0 - 0.2 %   Neutrophils Relative % 91 %   Neutro Abs 9.6 (H) 1.7 - 7.7 K/uL   Lymphocytes Relative 1 %   Lymphs Abs 0.1 (L) 0.7 - 4.0 K/uL   Monocytes Relative 0 %   Monocytes Absolute 0.0 (L) 0.1 - 1.0 K/uL   Eosinophils Relative 0 %   Eosinophils Absolute 0.0 0.0 - 0.5 K/uL   Basophils Relative 0 %   Basophils Absolute 0.0 0.0 - 0.1 K/uL   WBC Morphology Mild Left Shift (1-5% metas, occ myelo)    Smear Review PLATELET COUNT CONFIRMED BY SMEAR    Metamyelocytes Relative 8 %   Abs Immature Granulocytes 0.80 (H) 0.00 - 0.07 K/uL   Burr Cells PRESENT   Comprehensive metabolic panel     Status: Abnormal   Collection Time: 03/12/2023  4:30 PM  Result Value Ref Range   Sodium 123 (L) 135 - 145 mmol/L   Potassium 2.6 (LL) 3.5 - 5.1 mmol/L   Chloride 87 (L) 98 - 111 mmol/L   CO2 12 (L) 22 - 32 mmol/L   Glucose, Bld 102 (H) 70 - 99 mg/dL   BUN 48 (H) 8 - 23 mg/dL   Creatinine, Ser 6.29 (H) 0.44 - 1.00 mg/dL   Calcium 6.5 (L) 8.9 - 10.3 mg/dL   Total Protein 6.2 (L) 6.5 - 8.1 g/dL   Albumin 2.0 (L) 3.5 - 5.0 g/dL   AST 528 (H) 15 - 41 U/L   ALT 87 (H) 0 - 44 U/L   Alkaline Phosphatase 130 (H) 38 - 126 U/L   Total Bilirubin 0.8 0.3 - 1.2 mg/dL   GFR, Estimated 10 (L) >60 mL/min   Anion gap 22.0 (H) 5 - 15  Troponin I (High Sensitivity)     Status: Abnormal    Collection Time: 02/28/2023  4:30 PM  Result Value Ref Range   Troponin I (High Sensitivity) 88 (H) <18 ng/L  Resp panel by RT-PCR (RSV, Flu A&B, Covid) Anterior Nasal Swab     Status: None   Collection Time: 03/11/2023  5:06 PM   Specimen: Anterior Nasal Swab  Result Value Ref Range   SARS Coronavirus 2 by RT PCR NEGATIVE NEGATIVE   Influenza A by PCR NEGATIVE NEGATIVE   Influenza B by PCR NEGATIVE NEGATIVE   Resp Syncytial Virus by PCR NEGATIVE NEGATIVE  Lactic acid, plasma     Status: Abnormal   Collection Time: 03/05/2023  5:56 PM  Result Value Ref Range   Lactic Acid, Venous 8.0 (HH) 0.5 - 1.9 mmol/L  round, reactive to light. Extraocular muscles are intact. Sclerae anicteric and noninjected.  Moist mucosal membranes. No mucosal lesions.  Neck: Neck supple without lymphadenopathy. No carotid bruits. No masses palpated.  Cardiovascular: Tachycardic rate with normal S1-S2 sounds. No murmurs, rubs, gallops auscultated. No JVD.  Respiratory: Rhonchorous breathing.  Decreased breathing sounds on the left base.  No Rales.  No accessory muscle use. Abdomen: Soft, nontender, nondistended. Active bowel sounds. No masses or hepatosplenomegaly  Skin: There is a large hematoma appearing skin effect on the left forearm and a smaller area on the right hand.  These are secondary to radiation treatments. 4cm Stage 1 sacral decubitus pressure lesion. No rashes, lesions, or ulcerations.  Dry, warm to touch. 2+ dorsalis pedis and radial pulses. Musculoskeletal: No calf or leg pain. All major joints not erythematous nontender.  No upper or lower joint deformation.  Good ROM.  No contractures  Psychiatric: Unable to assess Neurologic: No focal neurological deficits observed.  Patient unable to directly participate in muscle strength testing.  Data Reviewed: Results for orders placed or performed during the hospital encounter of 03/01/2023 (from the past 24 hour(s))  Lactic acid,  plasma     Status: Abnormal   Collection Time: 02/13/2023  2:57 PM  Result Value Ref Range   Lactic Acid, Venous 7.4 (HH) 0.5 - 1.9 mmol/L  Urinalysis, w/ Reflex to Culture (Infection Suspected) -Urine, Clean Catch     Status: Abnormal   Collection Time: 03/04/2023  2:57 PM  Result Value Ref Range   Specimen Source URINE, CLEAN CATCH    Color, Urine AMBER (A) YELLOW   APPearance CLOUDY (A) CLEAR   Specific Gravity, Urine 1.021 1.005 - 1.030   pH 5.0 5.0 - 8.0   Glucose, UA NEGATIVE NEGATIVE mg/dL   Hgb urine dipstick NEGATIVE NEGATIVE   Bilirubin Urine NEGATIVE NEGATIVE   Ketones, ur NEGATIVE NEGATIVE mg/dL   Protein, ur 161 (A) NEGATIVE mg/dL   Nitrite NEGATIVE NEGATIVE   Leukocytes,Ua NEGATIVE NEGATIVE   RBC / HPF 6-10 0 - 5 RBC/hpf   WBC, UA 6-10 0 - 5 WBC/hpf   Bacteria, UA MANY (A) NONE SEEN   Squamous Epithelial / HPF 0-5 0 - 5 /HPF   WBC Clumps PRESENT    Mucus PRESENT   Brain natriuretic peptide     Status: Abnormal   Collection Time: 03/01/2023  2:57 PM  Result Value Ref Range   B Natriuretic Peptide 200.0 (H) 0.0 - 100.0 pg/mL  Blood Culture (routine x 2)     Status: None (Preliminary result)   Collection Time: 02/22/2023  3:02 PM   Specimen: Blood  Result Value Ref Range   Specimen Description      RIGHT ANTECUBITAL BOTTLES DRAWN AEROBIC AND ANAEROBIC   Special Requests      Blood Culture adequate volume Performed at [facility], [address], [place], [place] [address]    Culture PENDING    Report Status PENDING   Blood Culture (routine x 2)     Status: None (Preliminary result)   Collection Time: 03/05/2023  4:13 PM   Specimen: Blood  Result Value Ref Range   Specimen Description BLOOD LEFT HAND BOTTLES DRAWN AEROBIC ONLY    Special Requests      Blood Culture adequate volume Performed at [facility], [address] [place], [place] [address]    Culture PENDING    Report Status PENDING   Protime-INR     Status: Abnormal   Collection Time: 02/24/2023  byers   Social History:  reports that she has never smoked. She has never used smokeless tobacco. She reports that she does not drink alcohol and does not use drugs.  No Known Allergies  Family History  Problem Relation Age of Onset   Heart disease Brother    Cancer Daughter        endometial/uterine cancer(nodule in right lung)    Prior to Admission medications   Medication Sig Start Date End Date Taking? Authorizing Provider  albuterol (PROVENTIL) (2.5 MG/3ML) 0.083% nebulizer solution NEBULIZE 1 VIAL EVERY 4 HOURS AS NEEDED SHORTNESS OF BREATH Patient  taking differently: Take 2.5 mg by nebulization every 4 (four) hours as needed for wheezing or shortness of breath. 06/26/22   Gabriel Earing, FNP  ascorbic acid (VITAMIN C) 500 MG tablet Take 1 tablet (500 mg total) by mouth daily. 07/19/21   Vassie Loll, MD  aspirin 81 MG tablet Take 81 mg by mouth daily.    [provider]  azelastine (ASTELIN) 0.1 % nasal spray 2 SPRAYS IN EACH NOSTRIL AT BEDTIME 11/24/22   Gabriel Earing, FNP  Cyanocobalamin 1000 MCG CAPS Take 1,000 mcg by mouth daily.    [provider]  dextromethorphan-guaiFENesin (MUCINEX DM) 30-600 MG 12hr tablet Take 1 tablet by mouth 2 (two) times daily. 07/18/21   Vassie Loll, MD  donepezil (ARICEPT) 5 MG tablet Take 1 tablet (5 mg total) by mouth at bedtime. 09/21/22   Dettinger, Elige Radon, MD  feeding supplement (ENSURE ENLIVE / ENSURE PLUS) LIQD Take 237 mLs by mouth 2 (two) times daily between meals. Patient taking differently: Take 237 mLs by mouth daily as needed (Snack). 10/09/21   Sherryll Burger, Pratik D, DO  Ferrous Sulfate (IRON PO) Take 65 mg by mouth daily.    [provider]  Fluticasone-Umeclidin-Vilant (TRELEGY ELLIPTA) 100-62.5-25 MCG/ACT AEPB Inhale 1 puff into the lungs daily. (NEEDS TO BE SEEN BEFORE NEXT REFILL) 10/30/22   Gabriel Earing, FNP  furosemide (LASIX) 40 MG tablet Take 1 tablet (40 mg total) by mouth daily. Patient taking differently: Take 20 mg by mouth daily. 11/18/22   Gabriel Earing, FNP  losartan (COZAAR) 100 MG tablet TAKE ONE TABLET AT BEDTIME Patient taking differently: Take 25 mg by mouth at bedtime. 05/19/21   Jannifer Rodney A, FNP  MULTIPLE VITAMIN PO Take 1 tablet by mouth daily.    [provider]  OXYGEN Inhale 1.5 L into the lungs continuous. Per patient's son liters 1 1/2 now, at home 02 liters 1 1/4    [provider]  Probiotic Product (PROBIOTIC PO) Take 1 tablet by mouth daily.    [provider]  sodium chloride (OCEAN) 0.65 % SOLN  nasal spray Place 1 spray into both nostrils daily as needed for congestion.    [provider]    Physical Exam: Vitals:   02/16/2023 1530 02/18/2023 1545 03/03/2023 1830 03/02/2023 1927  BP: 116/61 114/67 (!) 143/108 (!) 105/43  Pulse: 90 80 (!) 112 (!) 102  Resp: (!) 27 (!) 33 (!) 29 (!) 30  Temp:    97.8 F (36.6 C)  TempSrc:    Axillary  SpO2: 98% 99% 100% 100%  Weight:      Height:       General: Elderly female. Awake and alert.  Unable to assess orientation due to lack of patient's verbal response no acute cardiopulmonary distress.  HEENT: Normocephalic atraumatic.  Right and left ears normal in appearance.  Pupils equal,  byers   Social History:  reports that she has never smoked. She has never used smokeless tobacco. She reports that she does not drink alcohol and does not use drugs.  No Known Allergies  Family History  Problem Relation Age of Onset   Heart disease Brother    Cancer Daughter        endometial/uterine cancer(nodule in right lung)    Prior to Admission medications   Medication Sig Start Date End Date Taking? Authorizing Provider  albuterol (PROVENTIL) (2.5 MG/3ML) 0.083% nebulizer solution NEBULIZE 1 VIAL EVERY 4 HOURS AS NEEDED SHORTNESS OF BREATH Patient  taking differently: Take 2.5 mg by nebulization every 4 (four) hours as needed for wheezing or shortness of breath. 06/26/22   Gabriel Earing, FNP  ascorbic acid (VITAMIN C) 500 MG tablet Take 1 tablet (500 mg total) by mouth daily. 07/19/21   Vassie Loll, MD  aspirin 81 MG tablet Take 81 mg by mouth daily.    [provider]  azelastine (ASTELIN) 0.1 % nasal spray 2 SPRAYS IN EACH NOSTRIL AT BEDTIME 11/24/22   Gabriel Earing, FNP  Cyanocobalamin 1000 MCG CAPS Take 1,000 mcg by mouth daily.    [provider]  dextromethorphan-guaiFENesin (MUCINEX DM) 30-600 MG 12hr tablet Take 1 tablet by mouth 2 (two) times daily. 07/18/21   Vassie Loll, MD  donepezil (ARICEPT) 5 MG tablet Take 1 tablet (5 mg total) by mouth at bedtime. 09/21/22   Dettinger, Elige Radon, MD  feeding supplement (ENSURE ENLIVE / ENSURE PLUS) LIQD Take 237 mLs by mouth 2 (two) times daily between meals. Patient taking differently: Take 237 mLs by mouth daily as needed (Snack). 10/09/21   Sherryll Burger, Pratik D, DO  Ferrous Sulfate (IRON PO) Take 65 mg by mouth daily.    [provider]  Fluticasone-Umeclidin-Vilant (TRELEGY ELLIPTA) 100-62.5-25 MCG/ACT AEPB Inhale 1 puff into the lungs daily. (NEEDS TO BE SEEN BEFORE NEXT REFILL) 10/30/22   Gabriel Earing, FNP  furosemide (LASIX) 40 MG tablet Take 1 tablet (40 mg total) by mouth daily. Patient taking differently: Take 20 mg by mouth daily. 11/18/22   Gabriel Earing, FNP  losartan (COZAAR) 100 MG tablet TAKE ONE TABLET AT BEDTIME Patient taking differently: Take 25 mg by mouth at bedtime. 05/19/21   Jannifer Rodney A, FNP  MULTIPLE VITAMIN PO Take 1 tablet by mouth daily.    [provider]  OXYGEN Inhale 1.5 L into the lungs continuous. Per patient's son liters 1 1/2 now, at home 02 liters 1 1/4    [provider]  Probiotic Product (PROBIOTIC PO) Take 1 tablet by mouth daily.    [provider]  sodium chloride (OCEAN) 0.65 % SOLN  nasal spray Place 1 spray into both nostrils daily as needed for congestion.    [provider]    Physical Exam: Vitals:   02/16/2023 1530 02/18/2023 1545 03/03/2023 1830 03/02/2023 1927  BP: 116/61 114/67 (!) 143/108 (!) 105/43  Pulse: 90 80 (!) 112 (!) 102  Resp: (!) 27 (!) 33 (!) 29 (!) 30  Temp:    97.8 F (36.6 C)  TempSrc:    Axillary  SpO2: 98% 99% 100% 100%  Weight:      Height:       General: Elderly female. Awake and alert.  Unable to assess orientation due to lack of patient's verbal response no acute cardiopulmonary distress.  HEENT: Normocephalic atraumatic.  Right and left ears normal in appearance.  Pupils equal,

## 2023-02-20 NOTE — ED Provider Notes (Signed)
[facility] AT [facility] Provider Note   CSN: [id number] Arrival date & time: 02/20/23  1447     History  Chief Complaint  Patient presents with   Shortness of Breath    Carrie Crawford is a 89 y.o. female.   Shortness of Breath  This patient is a 89 year old female with a long history of recurrent chronic hypoxic respiratory failure, she has lung nodules, COPD and evidently some CHF, she has dementia, she is on antihypertensives including losartan and is on at least 1-1/2 L of oxygen if not more for her respiratory failure.  She is also on Lasix.  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  Evidently paramedics were called to the house a couple of days ago for shortness of breath, at that time she looked okay, they called again today because of worsening shortness of breath and found her to be hypotensive and thus transported her to the hospital.  The patient is not able to answer any further questions for me    Home Medications Prior to Admission medications   Medication Sig Start Date End Date Taking? Authorizing Provider  albuterol (PROVENTIL) (2.5 MG/3ML) 0.083% nebulizer solution NEBULIZE 1 VIAL EVERY 4 HOURS AS NEEDED SHORTNESS OF BREATH Patient taking differently: Take 2.5 mg by nebulization every 4 (four) hours as needed for wheezing or shortness of breath. 06/26/22   Gabriel Earing, FNP  ascorbic acid (VITAMIN C) 500 MG tablet Take 1 tablet (500 mg total) by mouth daily. 07/19/21   Vassie Loll, MD  aspirin 81 MG tablet Take 81 mg by mouth daily.    [provider]  azelastine (ASTELIN) 0.1 % nasal spray 2 SPRAYS IN EACH NOSTRIL AT BEDTIME 11/24/22   Gabriel Earing, FNP  Cyanocobalamin 1000 MCG CAPS Take 1,000 mcg by mouth daily.    [provider]  dextromethorphan-guaiFENesin (MUCINEX DM) 30-600 MG 12hr tablet Take 1 tablet by mouth 2 (two) times daily. 07/18/21   Vassie Loll, MD  donepezil (ARICEPT) 5 MG tablet Take 1 tablet (5 mg total) by mouth at bedtime. 09/21/22   Dettinger, Elige Radon, MD  feeding supplement (ENSURE ENLIVE / ENSURE PLUS) LIQD Take 237 mLs by mouth 2 (two) times daily between meals. Patient taking differently: Take 237 mLs by mouth daily as needed (Snack). 10/09/21   Sherryll Burger, Pratik D, DO  Ferrous Sulfate (IRON PO) Take 65 mg by mouth daily.    [provider]  Fluticasone-Umeclidin-Vilant (TRELEGY ELLIPTA) 100-62.5-25 MCG/ACT AEPB Inhale 1 puff into the lungs daily. (NEEDS TO BE SEEN BEFORE NEXT REFILL) 10/30/22   Gabriel Earing, FNP  furosemide (LASIX) 40 MG tablet Take 1 tablet (40 mg total) by mouth daily. Patient taking differently: Take 20 mg by mouth daily. 11/18/22   Gabriel Earing, FNP  losartan (COZAAR) 100 MG tablet TAKE ONE TABLET AT BEDTIME Patient taking differently: Take 25 mg by mouth at bedtime. 05/19/21   Jannifer Rodney A, FNP  MULTIPLE VITAMIN PO Take 1 tablet by mouth daily.    [provider]  OXYGEN Inhale 1.5 L into the lungs continuous. Per patient's son liters 1 1/2 now, at home 02 liters 1 1/4  [provider]  Probiotic Product (PROBIOTIC PO) Take 1 tablet by mouth daily.    [provider]  sodium chloride (OCEAN) 0.65 % SOLN nasal spray Place 1 spray into both nostrils daily as needed for congestion.    [provider]      Allergies    Patient has no known allergies.    Review of Systems   Review of Systems  Unable to perform ROS: Dementia  Respiratory:  Positive for shortness of breath.     Physical Exam Updated Vital Signs BP (!) 143/108   Pulse (!) 112   Temp 98 F (36.7 C) (Rectal)   Resp (!) 29   Ht 1.626 m (5\' 4" )   Wt  77.1 kg   SpO2 100%   BMI 29.18 kg/m  Physical Exam Vitals and nursing note reviewed.  Constitutional:      General: She is in acute distress.     Appearance: She is well-developed. She is ill-appearing.  HENT:     Head: Normocephalic and atraumatic.     Mouth/Throat:     Mouth: Mucous membranes are dry.     Pharynx: No oropharyngeal exudate.  Eyes:     General: No scleral icterus.       Right eye: No discharge.        Left eye: No discharge.     Conjunctiva/sclera: Conjunctivae normal.     Pupils: Pupils are equal, round, and reactive to light.  Neck:     Thyroid: No thyromegaly.     Vascular: No JVD.  Cardiovascular:     Rate and Rhythm: Normal rate and regular rhythm.     Heart sounds: Normal heart sounds. No murmur heard.    No friction rub. No gallop.  Pulmonary:     Effort: Pulmonary effort is normal. No respiratory distress.     Breath sounds: Wheezing, rhonchi and rales present.  Abdominal:     General: Bowel sounds are normal. There is no distension.     Palpations: Abdomen is soft. There is no mass.     Tenderness: There is no abdominal tenderness.  Musculoskeletal:        General: No tenderness. Normal range of motion.     Cervical back: Normal range of motion and neck supple.     Right lower leg: No edema.     Left lower leg: No edema.  Lymphadenopathy:     Cervical: No cervical adenopathy.  Skin:    General: Skin is warm and dry.     Findings: No erythema or rash.     Comments: Skin lesions on the face, the left she can someone that has a bandage over it, no signs of overt infection.  No sacral decubitus found, chaperone present for exam  Neurological:     Mental Status: She is alert.     Coordination: Coordination normal.     Comments: The patient is able to open her eyes, she can open her hands, she can move her feet, she is extremely weak and cannot sit up off of a stretcher in an upright position requiring 2 people to help.  She does not answer  questions verbally, she does follow simple commands  Psychiatric:        Behavior: Behavior normal.     ED Results / Procedures / Treatments   Labs (all labs ordered are listed, but only abnormal results are displayed) Labs Reviewed  LACTIC ACID, PLASMA - Abnormal; Notable for the following components:  Result Value   Lactic Acid, Venous 7.4 (*)    All other components within normal limits  LACTIC ACID, PLASMA - Abnormal; Notable for the following components:   Lactic Acid, Venous 8.0 (*)    All other components within normal limits  URINALYSIS, W/ REFLEX TO CULTURE (INFECTION SUSPECTED) - Abnormal; Notable for the following components:   Color, Urine AMBER (*)    APPearance CLOUDY (*)    Protein, ur 100 (*)    Bacteria, UA MANY (*)    All other components within normal limits  BRAIN NATRIURETIC PEPTIDE - Abnormal; Notable for the following components:   B Natriuretic Peptide 200.0 (*)    All other components within normal limits  PROTIME-INR - Abnormal; Notable for the following components:   Prothrombin Time 21.1 (*)    INR 1.8 (*)    All other components within normal limits  APTT - Abnormal; Notable for the following components:   aPTT 119 (*)    All other components within normal limits  CBC WITH DIFFERENTIAL/PLATELET - Abnormal; Notable for the following components:   WBC 10.6 (*)    RBC 3.32 (*)    Hemoglobin 10.6 (*)    HCT 31.5 (*)    Platelets 41 (*)    nRBC 0.8 (*)    Neutro Abs 9.6 (*)    Lymphs Abs 0.1 (*)    Monocytes Absolute 0.0 (*)    Abs Immature Granulocytes 0.80 (*)    All other components within normal limits  COMPREHENSIVE METABOLIC PANEL - Abnormal; Notable for the following components:   Sodium 123 (*)    Potassium 2.6 (*)    Chloride 87 (*)    CO2 12 (*)    Glucose, Bld 102 (*)    BUN 48 (*)    Creatinine, Ser 4.11 (*)    Calcium 6.5 (*)    Total Protein 6.2 (*)    Albumin 2.0 (*)    AST 357 (*)    ALT 87 (*)    Alkaline  Phosphatase 130 (*)    GFR, Estimated 10 (*)    Anion gap 22.0 (*)    All other components within normal limits  TROPONIN I (HIGH SENSITIVITY) - Abnormal; Notable for the following components:   Troponin I (High Sensitivity) 88 (*)    All other components within normal limits  TROPONIN I (HIGH SENSITIVITY) - Abnormal; Notable for the following components:   Troponin I (High Sensitivity) 81 (*)    All other components within normal limits  RESP PANEL BY RT-PCR (RSV, FLU A&B, COVID)  RVPGX2  CULTURE, BLOOD (ROUTINE X 2)  CULTURE, BLOOD (ROUTINE X 2)  CBC WITH DIFFERENTIAL/PLATELET  LACTIC ACID, PLASMA    EKG EKG Interpretation Date/Time:  Saturday February 20 2023 15:05:28 EDT Ventricular Rate:  87 PR Interval:  217 QRS Duration:  96 QT Interval:  446 QTC Calculation: 537 R Axis:   -42  Text Interpretation: Sinus rhythm Borderline prolonged PR interval Left anterior fascicular block Abnormal R-wave progression, late transition Prolonged QT interval Confirmed by Eber Hong ([id number]) on 02/20/2023 3:20:10 PM  Radiology DG Chest Port 1 View  Result Date: 02/20/2023 CLINICAL DATA:  Difficulty breathing EXAM: PORTABLE CHEST 1 VIEW COMPARISON:  09/09/2022 FINDINGS: Stable heart size. Aortic atherosclerosis. Small-moderate left pleural effusion. Small nodular opacity projects over the right mid lung. No pneumothorax. IMPRESSION: 1. Small-moderate left pleural effusion. 2. Small nodular opacity projects over the right mid lung. Consider short-term follow-up x-ray or CT for  further evaluation. Electronically Signed   By: Duanne Guess D.O.   On: 02/20/2023 16:22    Procedures .Critical Care  Performed by: Eber Hong, MD Authorized by: Eber Hong, MD   Critical care provider statement:    Critical care time (minutes):  50   Critical care time was exclusive of:  Teaching time and separately billable procedures and treating other patients   Critical care was necessary to treat or  prevent imminent or life-threatening deterioration of the following conditions:  Respiratory failure and renal failure   Critical care was time spent personally by me on the following activities:  Development of treatment plan with patient or surrogate, discussions with consultants, evaluation of patient's response to treatment, examination of patient, ordering and review of laboratory studies, ordering and review of radiographic studies, ordering and performing treatments and interventions, pulse oximetry, re-evaluation of patient's condition, review of old charts and obtaining history from patient or surrogate   I assumed direction of critical care for this patient from another provider in my specialty: no     Care discussed with: admitting provider   Comments:           Medications Ordered in ED Medications  lactated ringers infusion ( Intravenous New Bag/Given 02/20/23 1655)  cefTRIAXone (ROCEPHIN) 2 g in sodium chloride 0.9 % 100 mL IVPB (2 g Intravenous New Bag/Given 02/20/23 1543)  azithromycin (ZITHROMAX) 500 mg in sodium chloride 0.9 % 250 mL IVPB (500 mg Intravenous New Bag/Given 02/20/23 1654)  0.9 %  sodium chloride infusion (has no administration in time range)  potassium chloride 10 mEq in 100 mL IVPB (has no administration in time range)  magnesium sulfate IVPB 2 g 50 mL (has no administration in time range)  lactated ringers bolus 2,000 mL (2,000 mLs Intravenous New Bag/Given 02/20/23 1705)    ED Course/ Medical Decision Making/ A&P Clinical Course as of 02/20/23 1857  Sat Feb 20, 2023  1551 The patient son is arrived and I have had a long discussion with him regarding the resuscitation status of his mother, he states that she would not want to be resuscitated with intubation or CPR or cardioversion or defibrillation.  She would want to receive IV fluids or antibiotics.  No vasopressor therapy.  He reports that she actually had a fever on Tuesday as high as 101.7, she was treated  with Tylenol and her family doctor called in cephalexin for what was thought to be related to a possible wound infection from her radiation site on the left cheek.  The fevers seem to get better but she became more short of breath and paramedics were called on Wednesday for that.  Today the shortness of breath seem worse and he had to turn up to 4 L off of 1 L which she is usually on.  She has not been ambulatory in 4 years, she is only eating 1 meal a day and is generally awake and alert but does not talk more than a word here or there. [BM]    Clinical Course User Index [BM] Eber Hong, MD                                 Medical Decision Making Amount and/or Complexity of Data Reviewed Labs: ordered. Radiology: ordered. ECG/medicine tests: ordered.  Risk Prescription drug management. Decision regarding hospitalization.    This patient presents to the ED for concern of increasing shortness of  breath, this involves an extensive number of treatment options, and is a complaint that carries with it a high risk of complications and morbidity.  The differential diagnosis includes acute on chronic hypoxic respiratory failure, hypercapnic respiratory failure, pneumonia, pulmonary embolism that seems less likely, she is not tachycardic, she has no edema, this could be related to aspiration, she does appear critically ill with increased oxygen requirement here, family not present on arrival   Co morbidities that complicate the patient evaluation  Chronic respiratory failure Dementia   Additional history obtained:  Additional history obtained from recent admission to the hospital in February for hypoxic respiratory failure External records from outside source obtained and reviewed including recent admissions`   Lab Tests:  I Ordered, and personally interpreted labs.  The pertinent results include: Elevated lactic acid, renal failure   Imaging Studies ordered:  I ordered imaging  studies including chest x-ray I independently visualized and interpreted imaging which showed effusion I agree with the radiologist interpretation   Cardiac Monitoring: / EKG:  The patient was maintained on a cardiac monitor.  I personally viewed and interpreted the cardiac monitored which showed an underlying rhythm of: [place] tachycardia   Consultations Obtained:  I requested consultation with the hospitalist Dr. Adrian Blackwater,  and discussed lab and imaging findings as well as pertinent plan - they recommend: Admission to the hospital   Problem List / ED Course / Critical interventions / Medication management  This patient presents critically ill with what appears to be acidosis and respiratory failure, it is not clear the exact cause as she does not have any obvious large infiltrates, she does have a lactic acid that was very high and continued to rise despite fluid resuscitation, there is no other sources of infection that were found up to this point however antibiotics to cover pulmonary pathogens were initially ordered.  I did discuss the case with the son who made her DNR and I discussed the case with the hospitalist to admit.  She is critically ill, very poor prognosis I have reviewed the patients home medicines and have made adjustments as needed   Social Determinants of Health:  Dementia   Test / Admission - Considered:  Admit to higher level of care         Final Clinical Impression(s) / ED Diagnoses Final diagnoses:  Acute respiratory failure with hypoxia (HCC)  Acute renal failure, unspecified acute renal failure type (HCC)  Lactic acidosis     Eber Hong, MD 02/20/23 1857

## 2023-02-20 NOTE — Sepsis Progress Note (Signed)
Sepsis protocol is being followed by [facility]. 

## 2023-02-20 NOTE — ED Triage Notes (Signed)
Pt was bib [facility] with SOB and gurgling. Pt is a hospice pt has been having issues breathing for a couple days, EMS was called out a couple days ago for SOB EMS checked pt out and everything checked out okay. Son call EMS today d/t SOB getting worse. Pt does have dementia and is unable to follow commands, Pt did have a few spots of skin cancer removed and is on an ABT for it.

## 2023-02-20 NOTE — ED Notes (Signed)
Unable to get IV access at this time, another nurse will attempt.

## 2023-02-20 NOTE — Progress Notes (Signed)
Patient Name: Carrie Crawford, female   DOB: [birth year], 89 y.o.  MRN: [id number]  I again spoke to the patient's son regarding new laboratory data: Lactic acid is elevated to greater than 9 D-dimer greater than 20 Fibrinogen low at 174. Repeat BMP shows minimal change in electrolyte status and metabolic acidosis.  Additionally, her blood pressure has been decreasing and is currently 90-100 systolically.  I discussed that all of these are poor prognostic indicators.  At this time he wishes that his mom become comfort measures only.  We discussed pain control, anxiolytics, stopping IV fluids, stopping antibiotics, stopping blood draws.  He is in agreement of all of this.  He does wish that morphine not be used, but is okay with other pain medicines if she is in pain.  He would prefer more anxiolytics.  I discussed that with stopping IV fluids, the sepsis will probably progress rapidly and that she will probably not make it through the night.  All questions were answered.  Patient has not yet gone to ICU.  Will change orders and admit the patient to MedSurg under comfort measures only.  CODE STATUS changed to DNR/comfort measures only.  Levie Heritage, DO 02/20/2023 9:52 PM

## 2023-02-20 NOTE — Progress Notes (Signed)
Pharmacy Antibiotic Note  Carrie Crawford is a 89 y.o. female admitted on 02/20/2023 with sepsis with unknown source.  Pharmacy has been consulted for vancomycin and cefepime dosing; also on metronidazole. AKI noted, SCr 4.11 (was 1.68 7/31 and ~1.1 09/2022). She is afebrile, WBC are elevated, LA is up to 8.  Plan: Vancomycin 1500 mg IV load - re-dose when level <20 mcg/ml Cefepime 2 g IV x1 then 1 g IV q24h Monitor renal function, clinical progress, cultures/sensitivities F/U LOT and de-escalate as able Vancomycin levels as clinically indicated   Height: 5\' 4"  (162.6 cm) Weight: 77.1 kg (169 lb 15.6 oz) IBW/kg (Calculated) : 54.7  Temp (24hrs), Avg:97.9 F (36.6 C), Min:97.8 F (36.6 C), Max:98 F (36.7 C)  Recent Labs  Lab 02/20/23 1457 02/20/23 1630 02/20/23 1756  WBC  --  10.6*  --   CREATININE  --  4.11*  --   LATICACIDVEN 7.4*  --  8.0*    Estimated Creatinine Clearance: 8.4 mL/min (A) (by C-G formula based on SCr of 4.11 mg/dL (H)).    No Known Allergies  Antimicrobials this admission: Ceftriaxone 8/10 x1 Azith 8/10 x1 Vancomycin 8/10 >> Cefepime 8/10 >>  Dose adjustments this admission:   Microbiology results: 8/10 BCx: pending   Thank you for involving pharmacy in this patient's care.  Loura Back, PharmD, BCPS Clinical Pharmacist Clinical phone for 02/20/2023 is [phone] 02/20/2023 8:03 PM

## 2023-02-20 NOTE — ED Notes (Signed)
Pt found to have Right upper Arm UV IV infiltrated. EDP Miller at bedside attempting IV access

## 2023-02-20 NOTE — Sepsis Progress Note (Signed)
Notified bedside nurse of need to draw repeat lactic acid since the 2nd LA was 8 and higher than the first..

## 2023-02-25 LAB — CULTURE, BLOOD (ROUTINE X 2)
Culture: NO GROWTH
Culture: NO GROWTH
Special Requests: ADEQUATE
Special Requests: ADEQUATE

## 2023-03-09 ENCOUNTER — Ambulatory Visit: Payer: Self-pay | Admitting: Radiation Oncology

## 2023-03-14 NOTE — Progress Notes (Signed)
Post mortem care provided, security contacted for transport to the morgue.

## 2023-03-14 NOTE — Death Summary Note (Signed)
DEATH SUMMARY   Patient Details  Name: Carrie Crawford MRN: [id number] DOB: 01-03-1926 OAC:[id number], Birder Robson, FNP Admission/Discharge Information   Admit Date:  2023/02/26  Date of Death: Date of Death: 02-27-23  Time of Death: Time of Death: 0509  Length of Stay: 1   Principle Cause of death: Severe Sepsis with septic shock  Hospital Diagnoses: Principal Problem:   Severe sepsis with septic shock (CODE) (HCC) Active Problems:   Acute respiratory failure with hypoxia (HCC)   Primary hypertension   Dementia without behavioral disturbance (HCC)   Diastolic heart failure (HCC)   Hyponatremia   GERD (gastroesophageal reflux disease)   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  Initially, patient's family desired treatment with antibiotics, fluid resuscitation.  However, as the patient continued to rapidly deteriorate, the family desired the patient be transition to comfort measures only.  Fluids, antibiotics, lab testing were all stopped and the patient was kept comfortable for the remainder of her life.  Assessment and Plan: No notes have been filed under this hospital service. Service: Hospitalist   Procedures: none  Consultations: none  The results of significant diagnostics from this hospitalization (including imaging, microbiology, ancillary and laboratory) are listed below for reference.   Significant Diagnostic Studies: CT ABDOMEN PELVIS WO CONTRAST  Result Date: 2023-02-26 CLINICAL DATA:  Possible sepsis, initial encounter EXAM: CT ABDOMEN AND PELVIS WITHOUT CONTRAST TECHNIQUE: Multidetector  CT imaging of the abdomen and pelvis was performed following the standard protocol without IV contrast. RADIATION DOSE REDUCTION: This exam was performed according to the departmental dose-optimization program which includes automated exposure control, adjustment of the mA and/or kV according to patient size and/or use of iterative reconstruction technique. COMPARISON:  10/06/2021, chest x-ray from earlier in the same day. FINDINGS: Lower chest: Lung bases demonstrate patchy atelectatic changes and mild changes of bronchiectasis. Additionally a nodular density within the right lung adjacent to the fissure is seen best noted on image number 11 of series 4. This measures 8 mm and is slightly increased when compared with the prior exam of 03-05-2022 at which time it measured 6 mm. Hepatobiliary: Hypodensity is noted in the central portion of the right lobe of the liver measuring 3.5 cm. This is stable from the prior exam and likely represents a cyst. Gallbladder has been surgically removed. Pancreas: Unremarkable. No pancreatic ductal dilatation or surrounding inflammatory changes. Spleen: Normal in size without focal abnormality. Adrenals/Urinary Tract: Adrenal glands are within normal limits. Kidneys are well visualize without renal calculi or obstructive changes. Stable cystic lesions are noted within the left kidney. No specific follow-up is recommended. No obstructive changes are seen. The bladder is decompressed. Stomach/Bowel: The appendix is within normal limits. No obstructive or inflammatory changes of the colon are seen. Small bowel and stomach are within normal limits. Vascular/Lymphatic: Aortic atherosclerosis. No enlarged abdominal or pelvic lymph nodes. Reproductive: Uterus and bilateral adnexa are unremarkable. Other: No abdominal wall hernia or abnormality. No abdominopelvic ascites. Musculoskeletal: Chronic T12 compression deformity is noted. Degenerative changes of lumbar spine are seen. IMPRESSION: Stable  hypodensity within the right lobe of the liver again  likely representing cyst. 8 mm nodule within the right upper lobe slightly increased in size from the prior exam at which time it measured 6 mm. Non-contrast chest CT at 6-12 months is recommended. If the nodule is stable at time of repeat CT, then future CT at 18-24 months (from today's scan) is considered optional for low-risk patients, but is recommended for high-risk patients. This recommendation follows the consensus statement: Guidelines for Management of Incidental Pulmonary Nodules Detected on CT Images: From the Fleischner Society 2017; Radiology 2017; 284:228-243. Chronic changes as described.  No acute abnormality noted. Electronically Signed   By: Alcide Clever M.D.   On: 02/20/2023 22:04   DG Chest Port 1 View  Result Date: 02/20/2023 CLINICAL DATA:  Difficulty breathing EXAM: PORTABLE CHEST 1 VIEW COMPARISON:  09/09/2022 FINDINGS: Stable heart size. Aortic atherosclerosis. Small-moderate left pleural effusion. Small nodular opacity projects over the right mid lung. No pneumothorax. IMPRESSION: 1. Small-moderate left pleural effusion. 2. Small nodular opacity projects over the right mid lung. Consider short-term follow-up x-ray or CT for further evaluation. Electronically Signed   By: Duanne Guess D.O.   On: 02/20/2023 16:22    Microbiology: Recent Results (from the past 240 hour(s))  Blood Culture (routine x 2)     Status: None (Preliminary result)   Collection Time: 02/20/23  3:02 PM   Specimen: Right Antecubital; Blood  Result Value Ref Range Status   Specimen Description   Final    RIGHT ANTECUBITAL BOTTLES DRAWN AEROBIC AND ANAEROBIC   Special Requests Blood Culture adequate volume  Final   Culture   Final    NO GROWTH 2 DAYS Performed at [facility], [address], [place], [place] [address]    Report Status PENDING  Incomplete  Blood Culture (routine x 2)     Status: None (Preliminary result)   Collection Time: 02/20/23   4:13 PM   Specimen: BLOOD LEFT HAND  Result Value Ref Range Status   Specimen Description BLOOD LEFT HAND BOTTLES DRAWN AEROBIC ONLY  Final   Special Requests Blood Culture adequate volume  Final   Culture   Final    NO GROWTH 2 DAYS Performed at [facility], [address] [place], [place] [address]    Report Status PENDING  Incomplete  Resp panel by RT-PCR (RSV, Flu A&B, Covid) Anterior Nasal Swab     Status: None   Collection Time: 02/20/23  5:06 PM   Specimen: Anterior Nasal Swab  Result Value Ref Range Status   SARS Coronavirus 2 by RT PCR NEGATIVE NEGATIVE Final    Comment: (NOTE) SARS-CoV-2 target nucleic acids are NOT DETECTED.  The SARS-CoV-2 RNA is generally detectable in upper respiratory specimens during the acute phase of infection. The lowest concentration of SARS-CoV-2 viral copies this assay can detect is 138 copies/mL. A negative result does not preclude SARS-Cov-2 infection and should not be used as the sole basis for treatment or other patient management decisions. A negative result may occur with  improper specimen collection/handling, submission of specimen other than nasopharyngeal swab, presence of viral mutation(s) within the areas targeted by this assay, and inadequate number of viral copies(<138 copies/mL). A negative result must be combined with clinical observations, patient history, and epidemiological information. The expected result is Negative.  Fact Sheet for Patients:  BloggerCourse.com  Fact Sheet for Healthcare Providers:  SeriousBroker.it  This test is no t yet approved or cleared by the [place] FDA and  has been authorized for detection and/or diagnosis  of SARS-CoV-2 by FDA under an Emergency Use Authorization (EUA). This EUA will remain  in effect (meaning this test can be used) for the duration of the COVID-19 declaration under Section 564(b)(1) of the Act, 21 U.S.C.section  360bbb-3(b)(1), unless the authorization is terminated  or revoked sooner.       Influenza A by PCR NEGATIVE NEGATIVE Final   Influenza B by PCR NEGATIVE NEGATIVE Final    Comment: (NOTE) The Xpert Xpress SARS-CoV-2/FLU/RSV plus assay is intended as an aid in the diagnosis of influenza from Nasopharyngeal swab specimens and should not be used as a sole basis for treatment. Nasal washings and aspirates are unacceptable for Xpert Xpress SARS-CoV-2/FLU/RSV testing.  Fact Sheet for Patients: BloggerCourse.com  Fact Sheet for Healthcare Providers: SeriousBroker.it  This test is not yet approved or cleared by the [place] FDA and has been authorized for detection and/or diagnosis of SARS-CoV-2 by FDA under an Emergency Use Authorization (EUA). This EUA will remain in effect (meaning this test can be used) for the duration of the COVID-19 declaration under Section 564(b)(1) of the Act, 21 U.S.C. section 360bbb-3(b)(1), unless the authorization is terminated or revoked.     Resp Syncytial Virus by PCR NEGATIVE NEGATIVE Final    Comment: (NOTE) Fact Sheet for Patients: BloggerCourse.com  Fact Sheet for Healthcare Providers: SeriousBroker.it  This test is not yet approved or cleared by the [place] FDA and has been authorized for detection and/or diagnosis of SARS-CoV-2 by FDA under an Emergency Use Authorization (EUA). This EUA will remain in effect (meaning this test can be used) for the duration of the COVID-19 declaration under Section 564(b)(1) of the Act, 21 U.S.C. section 360bbb-3(b)(1), unless the authorization is terminated or revoked.  Performed at [facility], [address], [place], [place] [address]     Time spent: 25 minutes  Signed: Levie Heritage, DO 02/22/23

## 2023-03-14 NOTE — Progress Notes (Signed)
Pt's son agreed to give pt pain medication for discomfort. PRN dilaudid given to pt for labored heavy breathing. Pt's son came out to room stating he had to go home and did not want anymore pain medication given to pt. Pt's son's number is in chart and will call son of any changes.

## 2023-03-14 NOTE — Progress Notes (Signed)
Pt passed away at 0509. Donah Driver, RN and Edgar Frisk verified time of death. Carrie Crawford (son) was contacted of her passing and bobby stated he was coming back to our unit to see his mother again. T. Opyd was notified.

## 2023-03-14 DEATH — deceased

## 2023-07-16 ENCOUNTER — Ambulatory Visit: Payer: Medicare Other | Admitting: Family Medicine

## 2023-08-13 ENCOUNTER — Ambulatory Visit: Payer: Medicare Other | Admitting: Family Medicine
